# Patient Record
Sex: Male | Born: 1935 | Race: White | Hispanic: No | Marital: Married | Smoking: Former smoker
Health system: Southern US, Community
[De-identification: ages and names within clinical notes are randomized; demographics above are authoritative.]

## PROBLEM LIST (undated history)

## (undated) DIAGNOSIS — M48061 Spinal stenosis, lumbar region without neurogenic claudication: Secondary | ICD-10-CM

## (undated) DIAGNOSIS — H409 Unspecified glaucoma: Secondary | ICD-10-CM

## (undated) DIAGNOSIS — I82409 Acute embolism and thrombosis of unspecified deep veins of unspecified lower extremity: Secondary | ICD-10-CM

## (undated) DIAGNOSIS — I2510 Atherosclerotic heart disease of native coronary artery without angina pectoris: Secondary | ICD-10-CM

## (undated) DIAGNOSIS — N183 Chronic kidney disease, stage 3 unspecified: Secondary | ICD-10-CM

## (undated) DIAGNOSIS — R7301 Impaired fasting glucose: Secondary | ICD-10-CM

## (undated) DIAGNOSIS — E785 Hyperlipidemia, unspecified: Secondary | ICD-10-CM

## (undated) DIAGNOSIS — M5416 Radiculopathy, lumbar region: Secondary | ICD-10-CM

## (undated) DIAGNOSIS — J301 Allergic rhinitis due to pollen: Secondary | ICD-10-CM

## (undated) DIAGNOSIS — E039 Hypothyroidism, unspecified: Secondary | ICD-10-CM

## (undated) DIAGNOSIS — I10 Essential (primary) hypertension: Secondary | ICD-10-CM

## (undated) DIAGNOSIS — C61 Malignant neoplasm of prostate: Secondary | ICD-10-CM

## (undated) DIAGNOSIS — Z8546 Personal history of malignant neoplasm of prostate: Secondary | ICD-10-CM

## (undated) HISTORY — DX: Hypothyroidism, unspecified: E03.9

## (undated) HISTORY — DX: Hyperlipidemia, unspecified: E78.5

## (undated) HISTORY — DX: Radiculopathy, lumbar region: M48.061

## (undated) HISTORY — DX: Unspecified glaucoma: H40.9

## (undated) HISTORY — DX: Essential (primary) hypertension: I10

## (undated) HISTORY — DX: Radiculopathy, lumbar region: M54.16

## (undated) HISTORY — DX: Impaired fasting glucose: R73.01

## (undated) HISTORY — DX: Atherosclerotic heart disease of native coronary artery without angina pectoris: I25.10

## (undated) HISTORY — PX: POSTERIOR LUMBAR FUSION: SHX6036

## (undated) HISTORY — DX: Personal history of malignant neoplasm of prostate: Z85.46

## (undated) HISTORY — DX: Chronic kidney disease, stage 3 unspecified: N18.30

## (undated) HISTORY — DX: Acute embolism and thrombosis of unspecified deep veins of unspecified lower extremity: I82.409

## (undated) HISTORY — DX: Chronic kidney disease, stage 3 (moderate): N18.3

## (undated) HISTORY — DX: Allergic rhinitis due to pollen: J30.1

---

## 1959-03-07 HISTORY — PX: ELBOW SURGERY: SHX618

## 2001-03-06 HISTORY — PX: CORONARY ARTERY BYPASS GRAFT: SHX141

## 2002-03-06 DIAGNOSIS — Z8546 Personal history of malignant neoplasm of prostate: Secondary | ICD-10-CM

## 2002-03-06 DIAGNOSIS — C61 Malignant neoplasm of prostate: Secondary | ICD-10-CM

## 2002-03-06 HISTORY — PX: INSERTION PROSTATE RADIATION SEED: SUR718

## 2002-03-06 HISTORY — DX: Malignant neoplasm of prostate: C61

## 2002-03-06 HISTORY — DX: Personal history of malignant neoplasm of prostate: Z85.46

## 2004-09-03 HISTORY — PX: CAROTID ENDARTERECTOMY: SUR193

## 2015-05-28 LAB — HM COLONOSCOPY

## 2015-11-02 ENCOUNTER — Encounter: Payer: Self-pay | Admitting: Internal Medicine

## 2015-11-02 ENCOUNTER — Ambulatory Visit (INDEPENDENT_AMBULATORY_CARE_PROVIDER_SITE_OTHER): Payer: Medicare PPO | Admitting: Internal Medicine

## 2015-11-02 VITALS — BP 124/74 | HR 60 | Temp 97.5°F | Ht 62.5 in | Wt 143.0 lb

## 2015-11-02 DIAGNOSIS — R0609 Other forms of dyspnea: Secondary | ICD-10-CM

## 2015-11-02 DIAGNOSIS — I82401 Acute embolism and thrombosis of unspecified deep veins of right lower extremity: Secondary | ICD-10-CM

## 2015-11-02 DIAGNOSIS — I2510 Atherosclerotic heart disease of native coronary artery without angina pectoris: Secondary | ICD-10-CM | POA: Diagnosis not present

## 2015-11-02 DIAGNOSIS — H409 Unspecified glaucoma: Secondary | ICD-10-CM | POA: Insufficient documentation

## 2015-11-02 DIAGNOSIS — M5416 Radiculopathy, lumbar region: Secondary | ICD-10-CM | POA: Diagnosis not present

## 2015-11-02 DIAGNOSIS — M48061 Spinal stenosis, lumbar region without neurogenic claudication: Secondary | ICD-10-CM | POA: Insufficient documentation

## 2015-11-02 DIAGNOSIS — Z8546 Personal history of malignant neoplasm of prostate: Secondary | ICD-10-CM | POA: Diagnosis not present

## 2015-11-02 DIAGNOSIS — I82409 Acute embolism and thrombosis of unspecified deep veins of unspecified lower extremity: Secondary | ICD-10-CM | POA: Insufficient documentation

## 2015-11-02 DIAGNOSIS — M4806 Spinal stenosis, lumbar region: Secondary | ICD-10-CM | POA: Diagnosis not present

## 2015-11-02 DIAGNOSIS — R7303 Prediabetes: Secondary | ICD-10-CM | POA: Insufficient documentation

## 2015-11-02 DIAGNOSIS — R0600 Dyspnea, unspecified: Secondary | ICD-10-CM

## 2015-11-02 DIAGNOSIS — E039 Hypothyroidism, unspecified: Secondary | ICD-10-CM

## 2015-11-02 DIAGNOSIS — N183 Chronic kidney disease, stage 3 unspecified: Secondary | ICD-10-CM | POA: Insufficient documentation

## 2015-11-02 DIAGNOSIS — I10 Essential (primary) hypertension: Secondary | ICD-10-CM | POA: Insufficient documentation

## 2015-11-02 DIAGNOSIS — R7301 Impaired fasting glucose: Secondary | ICD-10-CM

## 2015-11-02 DIAGNOSIS — E785 Hyperlipidemia, unspecified: Secondary | ICD-10-CM | POA: Insufficient documentation

## 2015-11-02 NOTE — Assessment & Plan Note (Addendum)
Recent labs stable Will recheck at next OV

## 2015-11-02 NOTE — Assessment & Plan Note (Signed)
Ongoing radicular pain Uses the oxycodone rarely

## 2015-11-02 NOTE — Assessment & Plan Note (Signed)
Okay on statin

## 2015-11-02 NOTE — Progress Notes (Signed)
Subjective:    Patient ID: Dennis Burns, male    DOB: September 03, 1928, 82 y.o.   MRN: [id number]  HPI Here to establish care Wife is with him Moved to [facility] about 3 months ago-from [place]  Has had problems walking lately 2 spinal fusions in past Has pain in left thigh and low back (chronic problem there) Has had extensive PT  Has had dyspnea with exertion for the past year--used to be much more active No chest pain No dizziness or syncope  History of prostate ca Seed and RT PSAs have stayed low--stopped now  Had DVT in right leg noted before 2012 surgery Recurred so now staying on the xarelto  On treatment for glaucoma Pressures have been fine with the Rx  No current outpatient prescriptions on file prior to visit.   No current facility-administered medications on file prior to visit.     No Known Allergies  Past Medical History:  Diagnosis Date  . Allergic rhinitis due to pollen    as child--better now  . Chronic kidney disease, stage III (moderate)   . Coronary artery disease   . DVT, lower extremity, recurrent ([place])   . Glaucoma    [facility]   . History of prostate cancer 2004  . Hyperlipidemia   . Hypertension   . Hypothyroidism   . Impaired fasting glucose   . Spinal stenosis of lumbar region with radiculopathy     Past Surgical History:  Procedure Laterality Date  . CAROTID ENDARTERECTOMY Right 09/2004  . CORONARY ARTERY BYPASS GRAFT  2003  . ELBOW SURGERY Left 1961   after fall  . INSERTION PROSTATE RADIATION SEED  2004   and RT  . POSTERIOR LUMBAR FUSION  2012, 2014    Family History  Problem Relation Age of Onset  . Cancer Mother     colon (cause of death) and breast  . Glaucoma Mother   . Cancer Father     colon  . Stroke Maternal Grandfather   . Diabetes Neg Hx     Social History   Social History  . Marital status: Married    Spouse name: N/A  . Number of children: 5  . Years of education: N/A   Occupational History    . Engineer/consultant--retired     Radio broadcast assistant   Social History Main Topics  . Smoking status: Former Smoker    Quit date: 1978  . Smokeless tobacco: Never Used  . Alcohol use Not on file  . Drug use: Unknown  . Sexual activity: Not on file   Other Topics Concern  . Not on file   Social History Narrative   2nd marriage--- 1980   5 children--2 step children   12 grandchildren   36 great grandchildren      Has living will   Wife is health care POA   Would accept resuscitation   Not sure about tube feeds   Review of Systems  Constitutional: Negative for fatigue and unexpected weight change.  HENT:       Hearing aides Teeth are fine  Respiratory: Positive for shortness of breath. Negative for cough and chest tightness.   Cardiovascular: Negative for chest pain, palpitations and leg swelling.  Gastrointestinal: Negative for abdominal pain, blood in stool and constipation.       No heartburn  Genitourinary: Negative for difficulty urinating and urgency.       Nocturia x 3-4  Musculoskeletal: Positive for arthralgias and back pain.  Some hand arthritis  Skin: Negative for rash.       No suspicious lesions  Neurological: Negative for dizziness, syncope, light-headedness and headaches.  Hematological: Negative for adenopathy. Bruises/bleeds easily.  Psychiatric/Behavioral: Positive for sleep disturbance. Negative for dysphoric mood. The patient is not nervous/anxious.        Sleep affected by nocturia       Objective:   Physical Exam  Constitutional: He appears well-developed and well-nourished. No distress.  HENT:  Mouth/Throat: Oropharynx is clear and moist. No oropharyngeal exudate.  Neck: Normal range of motion. Neck supple. No thyromegaly present.  No thyroid nodule  Cardiovascular: Normal rate, regular rhythm and intact distal pulses.  Exam reveals no gallop.   Coarse Gr 3/6 systolic murmur at upper left SB and blowing murmur at apex ?referred into  carotids (or bruits there) Faint pedal pulses  Pulmonary/Chest: Effort normal and breath sounds normal. No respiratory distress. He has no wheezes. He has no rales.  Abdominal: Soft. There is no tenderness.  Musculoskeletal: He exhibits no edema or tenderness.  Lymphadenopathy:    He has no cervical adenopathy.  Skin: No rash noted. No erythema.  Psychiatric: He has a normal mood and affect. His behavior is normal.          Assessment & Plan:

## 2015-11-02 NOTE — Assessment & Plan Note (Signed)
BP Readings from Last 3 Encounters:  11/02/15 124/74   Good control Will continue current meds If stays down--consider stopping doxazosin (especially if ever has dizziness)

## 2015-11-02 NOTE — Assessment & Plan Note (Addendum)
Doesn't clearly seem to be related to the back problems Has mild AS and moderate MR on echo 2015--but normal LV function Most concerning is possible CAD worsening (anginal equivalent)  Will need stress test or cath Will set up with cardiology  EKG shows LVH with strain pattern which is not new (though there seem to be some lead problems between the 2)

## 2015-11-02 NOTE — Assessment & Plan Note (Signed)
Will continue the xarelto 

## 2015-11-03 ENCOUNTER — Encounter: Payer: Self-pay | Admitting: Internal Medicine

## 2015-11-03 ENCOUNTER — Ambulatory Visit (INDEPENDENT_AMBULATORY_CARE_PROVIDER_SITE_OTHER): Payer: Medicare PPO | Admitting: Cardiovascular Disease

## 2015-11-03 ENCOUNTER — Encounter: Payer: Self-pay | Admitting: Cardiovascular Disease

## 2015-11-03 VITALS — BP 130/60 | HR 66 | Ht 62.0 in | Wt 143.5 lb

## 2015-11-03 DIAGNOSIS — I6529 Occlusion and stenosis of unspecified carotid artery: Secondary | ICD-10-CM | POA: Diagnosis not present

## 2015-11-03 DIAGNOSIS — E785 Hyperlipidemia, unspecified: Secondary | ICD-10-CM

## 2015-11-03 DIAGNOSIS — Z951 Presence of aortocoronary bypass graft: Secondary | ICD-10-CM

## 2015-11-03 DIAGNOSIS — R0989 Other specified symptoms and signs involving the circulatory and respiratory systems: Secondary | ICD-10-CM

## 2015-11-03 DIAGNOSIS — I209 Angina pectoris, unspecified: Secondary | ICD-10-CM

## 2015-11-03 DIAGNOSIS — R0602 Shortness of breath: Secondary | ICD-10-CM | POA: Diagnosis not present

## 2015-11-03 DIAGNOSIS — Z9889 Other specified postprocedural states: Secondary | ICD-10-CM

## 2015-11-03 DIAGNOSIS — I739 Peripheral vascular disease, unspecified: Secondary | ICD-10-CM

## 2015-11-03 MED ORDER — TRAMADOL HCL 50 MG PO TABS: 100.0000 mg | ORAL_TABLET | Freq: Three times a day (TID) | ORAL | 0 refills | Status: DC | PRN

## 2015-11-03 MED ORDER — TETANUS-DIPHTHERIA TOXOIDS TD 5-2 LFU IM INJ: 0.5000 mL | INJECTION | Freq: Once | INTRAMUSCULAR | 0 refills | Status: AC

## 2015-11-03 NOTE — Addendum Note (Signed)
Addended by: [name] I on: 11/03/2015 01:24 PM   Modules accepted: Orders

## 2015-11-03 NOTE — Addendum Note (Signed)
Addended by: [name] I on: 11/03/2015 01:26 PM   Modules accepted: Orders

## 2015-11-03 NOTE — Progress Notes (Addendum)
Cardiology Office Note  Date:  11/03/2015   ID:  Dennis Burns, DOB Jul 07, 1928, MRN [id number]  PCP:  [name], MD   Chief Complaint  Patient presents with  . Other    Ref by Dr. Silvio Pate to establish care for history of CAD; CABG x 4. Meds reviewed by the patient verbally. Pt. c/o shortness of breath.     HPI:  Dennis Burns is a pleasant 82 year old gentleman with history of PAD, s/p CEA on the right, Hyperlipidemia, hypertension, coronary artery disease, CABG x 4 in 2003,  Chronic renal insufficiency who presents by referral from Dr. Silvio Pate for consultation of his coronary disease and PAD He has chronic back pain, sciatica History of prostate cancer, radioactive seeds  For many years since his back surgery 2012 and 2014, he reports he is unable to walk for far Since that time he has had chronic shortness of breath He attributes his symptoms of shortness of breath to his back Last stress test many years ago, but he does not have details with him Records provided to Dr. Silvio Pate to scan in the computer He does report having echocardiogram but does not know when, does not know his ejection fraction  He does have carotid Carotid u/s typically once year, known CEA, bilateral disease Does not know the percentage of his blockages  Used to go to pain management in [place], takes tramadol, occasional opiates  Reports he has had more  problems walking lately Has pain in left thigh and low back . He has tried extensive PT Limited in his ability to exercise as he was told not to twist his back  No dizziness or syncope Reports he is asymptomatic from his sinus bradycardia  Had DVT in right leg noted before 2012 surgery on the xarelto  EKG from 11/02/2015 reviewed This shows sinus bradycardia, ST and T wave abnormality V5, V6, 1 and aVL     Medication List       Accurate as of 11/03/15  1:16 PM. Always use your most recent med list.          Alpha-Lipoic Acid 200 MG  Caps Take 1 capsule by mouth daily.   atorvastatin 80 MG tablet Commonly known as:  LIPITOR Take 1 tablet by mouth daily.   CALCIUM CITRATE PO Take 1 tablet by mouth daily. Takes 300mg  daily.   carvedilol 12.5 MG tablet Commonly known as:  COREG Take 1 tablet by mouth 2 (two) times daily.   Co Q10 200 MG Caps Take 1 capsule by mouth daily.   CRANBERRY EXTRACT PO Take 1 tablet by mouth daily. Takes 1500mg  daily   doxazosin 1 MG tablet Commonly known as:  CARDURA Take 1 mg by mouth daily.   furosemide 40 MG tablet Commonly known as:  LASIX Take 1 tablet by mouth 2 (two) times daily. 1 tablet at noon and 1 at 6pm   latanoprost 0.005 % ophthalmic solution Commonly known as:  XALATAN Place 1 drop into both eyes daily. In morning   levothyroxine 75 MCG tablet Commonly known as:  SYNTHROID, LEVOTHROID Take 1 tablet by mouth daily.   multivitamin tablet Take 1 tablet by mouth daily.   NIFEdipine 60 MG 24 hr tablet Commonly known as:  PROCARDIA-XL/ADALAT CC Take 1 tablet by mouth 2 (two) times daily.   OMEGA-3-6-9 PO Take 1 capsule by mouth 2 (two) times daily.   oxyCODONE 5 MG immediate release tablet Commonly known as:  Oxy IR/ROXICODONE Take 1 tablet by mouth 2 (  two) times daily as needed.   Rivaroxaban 15 MG Tabs tablet Commonly known as:  XARELTO Take 15 mg by mouth daily with supper.   timolol 0.25 % ophthalmic solution Commonly known as:  TIMOPTIC Place 1 drop into both eyes daily. am   traMADol 50 MG tablet Commonly known as:  ULTRAM Take 2 tablets by mouth 3 (three) times daily as needed.   valsartan 80 MG tablet Commonly known as:  DIOVAN Take 1 tablet by mouth 2 (two) times daily.   Vitamin D 2000 units Caps Take 1 capsule by mouth daily.       No Known Allergies  PMH:   has a past medical history of Allergic rhinitis due to pollen; Chronic kidney disease, stage III (moderate); Coronary artery disease; DVT, lower extremity, recurrent ([place]);  Glaucoma; History of prostate cancer (2004); Hyperlipidemia; Hypertension; Hypothyroidism; Impaired fasting glucose; and Spinal stenosis of lumbar region with radiculopathy.  PSH:    Past Surgical History:  Procedure Laterality Date  . CAROTID ENDARTERECTOMY Right 09/2004   [place] GRAFT  2003   [place]   after fall  . INSERTION PROSTATE RADIATION SEED  2004   and RT  . POSTERIOR LUMBAR FUSION  2012, 2014    Current Outpatient Prescriptions  Medication Sig Dispense Refill  . Alpha-Lipoic Acid 200 MG CAPS Take 1 capsule by mouth daily.    Marland Kitchen atorvastatin (LIPITOR) 80 MG tablet Take 1 tablet by mouth daily.    Marland Kitchen CALCIUM CITRATE PO Take 1 tablet by mouth daily. Takes 300mg  daily.    . carvedilol (COREG) 12.5 MG tablet Take 1 tablet by mouth 2 (two) times daily.    . Cholecalciferol (VITAMIN D) 2000 units CAPS Take 1 capsule by mouth daily.    . Coenzyme Q10 (CO Q10) 200 MG CAPS Take 1 capsule by mouth daily.    Marland Kitchen CRANBERRY EXTRACT PO Take 1 tablet by mouth daily. Takes 1500mg  daily    . doxazosin (CARDURA) 1 MG tablet Take 1 mg by mouth daily.    . furosemide (LASIX) 40 MG tablet Take 1 tablet by mouth 2 (two) times daily. 1 tablet at noon and 1 at 6pm    . latanoprost (XALATAN) 0.005 % ophthalmic solution Place 1 drop into both eyes daily. In morning    . levothyroxine (SYNTHROID, LEVOTHROID) 75 MCG tablet Take 1 tablet by mouth daily.    . Multiple Vitamin (MULTIVITAMIN) tablet Take 1 tablet by mouth daily.    Marland Kitchen NIFEdipine (PROCARDIA-XL/ADALAT CC) 60 MG 24 hr tablet Take 1 tablet by mouth 2 (two) times daily.    [name] 3-6-9 Fatty Acids (OMEGA-3-6-9 PO) Take 1 capsule by mouth 2 (two) times daily.    Marland Kitchen oxyCODONE (OXY IR/ROXICODONE) 5 MG immediate release tablet Take 1 tablet by mouth 2 (two) times daily as needed.    . Rivaroxaban (XARELTO) 15 MG TABS tablet Take 15 mg by mouth daily with supper.    . timolol (TIMOPTIC)  0.25 % ophthalmic solution Place 1 drop into both eyes daily. am    . traMADol (ULTRAM) 50 MG tablet Take 2 tablets by mouth 3 (three) times daily as needed.    . valsartan (DIOVAN) 80 MG tablet Take 1 tablet by mouth 2 (two) times daily.     No current facility-administered medications for this visit.      Allergies:   Review of patient's allergies indicates no  known allergies.   Social History:  The patient  reports that he quit smoking about 39 years ago. He has never used smokeless tobacco.   Family History:   family history includes Cancer in his father and mother; Glaucoma in his mother; Stroke in his maternal grandfather.    Review of Systems: Review of Systems  Constitutional: Negative.   Respiratory: Positive for shortness of breath.   Cardiovascular: Negative.   Gastrointestinal: Negative.   Musculoskeletal: Positive for back pain.       Sciatica pain  Neurological: Negative.   Psychiatric/Behavioral: Negative.   All other systems reviewed and are negative.    PHYSICAL EXAM: VS:  BP 130/60 (BP Location: Right Arm, Patient Position: Sitting, Cuff Size: Normal)   Pulse 66   Ht 5\' 2"  (1.575 m)   Wt 143 lb 8 oz (65.1 kg)   BMI 26.25 kg/m  , BMI Body mass index is 26.25 kg/m. GEN: Well nourished, well developed, in no acute distress  HEENT: normal  Neck: no JVD, 2+ b/l carotid bruits, or masses Cardiac: RRR; 2+  Murmur LSB, no rubs, or gallops,no edema  Respiratory:  clear to auscultation bilaterally, normal work of breathing GI: soft, nontender, nondistended, + BS MS: no deformity or atrophy  Skin: warm and dry, no rash Neuro:  Strength and sensation are intact Psych: euthymic mood, full affect    Recent Labs: No results found for requested labs within last 8760 hours.    Lipid Panel No results found for: CHOL, HDL, LDLCALC, TRIG    Wt Readings from Last 3 Encounters:  11/03/15 143 lb 8 oz (65.1 kg)  11/02/15 143 lb (64.9 kg)       ASSESSMENT AND  PLAN:  SOB (shortness of breath) - Plan: NM Myocar Multi W/Spect W/Wall Motion / EF, VAS US CAROTID, LE ARTERIAL Etiology unclear though concerning for ischemia No recent stress test Echocardiogram not available, we'll try to obtain records He is unable to treadmill, pharmacologic Myoview has been ordered  Angina pectoris ([place]) - Plan: NM Myocar Multi W/Spect W/Wall Motion / EF, VAS US CAROTID, LE ARTERIAL Denies chest pain but he is very limited in his ability to ambulate Certainly possible that shortness of breath is his anginal equivalent Stress test as above  Hx of CABG - Plan: NM Myocar Multi W/Spect W/Wall Motion / EF, VAS US CAROTID, LE ARTERIAL We will try to obtain his prior records to determine his surgical anatomy CABG in 2003, no recent cardiac catheterization  Carotid stenosis, unspecified laterality - Plan: NM Myocar Multi W/Spect W/Wall Motion / EF, VAS US CAROTID, LE ARTERIAL Bilateral carotid bruits, known carotid endarterectomy on the right Bruits are very prominent He reports he is scheduled for routine annual ultrasound. We'll schedule this through our office  History of CEA (carotid endarterectomy) - Plan: NM Myocar Multi W/Spect W/Wall Motion / EF, VAS US CAROTID, LE ARTERIAL Previous surgery on the right  Claudication ([place]) - Plan: NM Myocar Multi W/Spect W/Wall Motion / EF, VAS US CAROTID, LE ARTERIAL He does report significant aching in his legs, inability to ambulate very far Reports he's never had lower extremity arterial Dopplers to rule out claudication Lower extremity arterial Doppler has been ordered  Hyperlipidemia Encouraged him to stay on his statin, goal LDL less than 70  Sinus bradycardia Secondary to carvedilol, reports he is asymptomatic Prefers not to change the medications at this time    Total encounter time more than 60 minutes  Greater than 50%  was spent in counseling and coordination of care with the patient   Disposition:   F/U  6  months   Orders Placed This Encounter  Procedures  . NM Myocar Multi W/Spect W/Wall Motion / EF     Signed, Esmond Plants, M.D., Ph.D. 11/03/2015  [place], [place]

## 2015-11-03 NOTE — Patient Instructions (Addendum)
Medication Instructions:   No medication changes made  Labwork:  No new labs needed  Testing/Procedures:  We will schedule a lexiscan myoview for shortness of breath, angina, known CAD, CABG (stop nifedipine and coreg the night before and morning of the stress test) [place]  Your caregiver has ordered a Stress Test with nuclear imaging. The purpose of this test is to evaluate the blood supply to your heart muscle. This procedure is referred to as a "Non-Invasive Stress Test." This is because other than having an IV started in your vein, nothing is inserted or "invades" your body. Cardiac stress tests are done to find areas of poor blood flow to the heart by determining the extent of coronary artery disease (CAD). Some patients exercise on a treadmill, which naturally increases the blood flow to your heart, while others who are  unable to walk on a treadmill due to physical limitations have a pharmacologic/chemical stress agent called Lexiscan . This medicine will mimic walking on a treadmill by temporarily increasing your coronary blood flow.   Please note: these test may take anywhere between 2-4 hours to complete  PLEASE REPORT TO [facility] AT THE FIRST DESK WILL DIRECT YOU WHERE TO GO  Date of Procedure:_____Tuesday, Sept 5_______  Arrival Time for Procedure:____7:15 am________  Instructions regarding medication:   __X__:  Hold NIFEDIPINE & COREG the night before and morning of procedure  How to prepare for your Myoview test: 1. Do not eat or drink after midnight 2. No caffeine for 24 hours prior to test 3. No smoking 24 hours prior to test. 4. Your medication may be taken with water.  If your doctor stopped a medication because of this test, do not take that medication. 5.  Please wear a short sleeve shirt. 6. No perfume, cologne or lotion.   We will also order a carotid ultrasound for carotid stenosis, CEA, bruit  Lower extremity  arterial U/S for claudication symptoms   Follow-Up: It was a pleasure seeing you in the office today. Please call us if you have new issues that need to be addressed before your next appt.  [phone]  Your physician wants you to follow-up in: 6 months.  You will receive a reminder letter in the mail two months in advance. If you don't receive a letter, please call our office to schedule the follow-up appointment.  If you need a refill on your cardiac medications before your next appointment, please call your pharmacy.  Cardiac Nuclear Scanning A cardiac nuclear scan is used to check your heart for problems, such as the following:  A portion of the heart is not getting enough blood.  Part of the heart muscle has died, which happens with a heart attack.  The heart wall is not working normally.  In this test, a radioactive dye (tracer) is injected into your bloodstream. After the tracer has traveled to your heart, a scanning device is used to measure how much of the tracer is absorbed by or distributed to various areas of your heart. LET [facility] CARE PROVIDER KNOW ABOUT:  Any allergies you have.  All medicines you are taking, including vitamins, herbs, eye drops, creams, and over-the-counter medicines.  Previous problems you or members of your family have had with the use of anesthetics.  Any blood disorders you have.  Previous surgeries you have had.  Medical conditions you have.  RISKS AND COMPLICATIONS Generally, this is a safe procedure. However, as with any procedure,  problems can occur. Possible problems include:   Serious chest pain.  Rapid heartbeat.  Sensation of warmth in your chest. This usually passes quickly. BEFORE THE PROCEDURE Ask your health care provider about changing or stopping your regular medicines. PROCEDURE This procedure is usually done at a hospital and takes 2-4 hours.  An IV tube is inserted into one of your veins.  Your health  care provider will inject a small amount of radioactive tracer through the tube.  You will then wait for 20-40 minutes while the tracer travels through your bloodstream.  You will lie down on an exam table so images of your heart can be taken. Images will be taken for about 15-20 minutes.  You will exercise on a treadmill or stationary bike. While you exercise, your heart activity will be monitored with an electrocardiogram (ECG), and your blood pressure will be checked.  If you are unable to exercise, you may be given a medicine to make your heart beat faster.  When blood flow to your heart has peaked, tracer will again be injected through the IV tube.  After 20-40 minutes, you will get back on the exam table and have more images taken of your heart.  When the procedure is over, your IV tube will be removed. AFTER THE PROCEDURE  You will likely be able to leave shortly after the test. Unless your health care provider tells you otherwise, you may return to your normal schedule, including diet, activities, and medicines.  Make sure you find out how and when you will get your test results.   This information is not intended to replace advice given to you by your health care provider. Make sure you discuss any questions you have with your health care provider.   Document Released: 03/17/2004 Document Revised: 02/25/2013 Document Reviewed: 01/29/2013 Elsevier Interactive Patient Education 2016 [employer].   Vascular Ultrasound An ultrasound, also called sonography or ultrasonography, uses harmless sound waves to take pictures of the inside of your body. The pictures are taken with a device called a transducer that is held up against your body. The continually changing pictures can be recorded on videotape or film. A vascular ultrasound is a painless test to see if you have blood flow problems or clots in your blood vessels. It may be done to look at blood vessels almost anywhere in the  body. There are several types of ultrasounds that can be done to look at the blood vessels. They include:  Continuous wave Doppler ultrasound. This type of ultrasound uses the change in pitch of sound waves to provide information about blood flow through a blood vessel. During the test, a health care provider listens to the sounds produced by the transducer.  Duplex ultrasound. This type of ultrasound uses standard ultrasound methods to produce a picture of a blood vessel and surrounding organs. In addition, a computer provides information about the speed and direction of blood flow through the blood vessel. With this type of ultrasound it is possible to see the structures inside the body and to evaluate blood flow within those structures at the same time.  Color Doppler ultrasound. This type of ultrasound uses standard ultrasound methods to produce a picture of a blood vessel. In addition, a computer converts the Doppler sounds into colors that are overlaid on the picture of the blood vessel. These colors represent the speed and direction of blood flow through the vessel.  Power Doppler ultrasound. This type of ultrasound is up to five times  more sensitive than color Doppler ultrasound. Power Doppler ultrasound can also get pictures that are difficult or impossible to get using standard color Doppler ultrasound. Power Doppler ultrasound is most commonly used to evaluate blood flow through vessels within organs, such as the liver or kidneys.  Transcranial Doppler ultrasound. This type of ultrasound looks at blood flow in blood vessels throughout the brain. It can reveal the presence of narrow arteries, clots blocking the vessels, or malformed blood vessels. RISKS AND COMPLICATIONS There are no known risks or complications of having an ultrasound. BEFORE THE PROCEDURE  If the ultrasound scan involves your upper abdomen, you may be directed not to eat, smoke, or chew gum the morning of your exam. Follow  your health care provider's instructions.  During the test, a gel will be applied to your skin. Wear clothing that is easily washable in case the gel gets on your clothes. PROCEDURE  A gel will be applied to your skin. It may feel cool.  The transducer will be placed on the area to be examined.  Pictures will be taken. They will be displayed on one or more monitors that look like small television screens. AFTER THE PROCEDURE  You can safely drive home and return to regular activities immediately after your exam.  Keep follow-up visits as directed by your health care provider.  Ask when your test results will be ready. It is your responsibility to get your test results.   This information is not intended to replace advice given to you by your health care provider. Make sure you discuss any questions you have with your health care provider.   Document Released: 03/03/2004 Document Revised: 03/13/2014 Document Reviewed: 05/15/2013 Elsevier Interactive Patient Education [employer].

## 2015-11-09 ENCOUNTER — Encounter: Payer: Self-pay | Admitting: Cardiovascular Disease

## 2015-11-09 ENCOUNTER — Other Ambulatory Visit: Payer: Self-pay | Admitting: *Deleted

## 2015-11-09 ENCOUNTER — Ambulatory Visit
Admission: RE | Admit: 2015-11-09 | Discharge: 2015-11-09 | Disposition: A | Discharge status: Home / Self Care | Payer: Medicare PPO | Source: Ambulatory Visit | Attending: Cardiovascular Disease | Admitting: Cardiovascular Disease

## 2015-11-09 ENCOUNTER — Telehealth: Payer: Self-pay | Admitting: Cardiovascular Disease

## 2015-11-09 DIAGNOSIS — I6529 Occlusion and stenosis of unspecified carotid artery: Secondary | ICD-10-CM | POA: Diagnosis not present

## 2015-11-09 DIAGNOSIS — I259 Chronic ischemic heart disease, unspecified: Secondary | ICD-10-CM | POA: Insufficient documentation

## 2015-11-09 DIAGNOSIS — Z951 Presence of aortocoronary bypass graft: Secondary | ICD-10-CM | POA: Diagnosis not present

## 2015-11-09 DIAGNOSIS — I739 Peripheral vascular disease, unspecified: Secondary | ICD-10-CM

## 2015-11-09 DIAGNOSIS — I25119 Atherosclerotic heart disease of native coronary artery with unspecified angina pectoris: Secondary | ICD-10-CM | POA: Diagnosis not present

## 2015-11-09 DIAGNOSIS — R0602 Shortness of breath: Secondary | ICD-10-CM

## 2015-11-09 DIAGNOSIS — R931 Abnormal findings on diagnostic imaging of heart and coronary circulation: Secondary | ICD-10-CM | POA: Insufficient documentation

## 2015-11-09 DIAGNOSIS — Z9889 Other specified postprocedural states: Secondary | ICD-10-CM

## 2015-11-09 DIAGNOSIS — I209 Angina pectoris, unspecified: Secondary | ICD-10-CM | POA: Diagnosis not present

## 2015-11-09 DIAGNOSIS — R0989 Other specified symptoms and signs involving the circulatory and respiratory systems: Secondary | ICD-10-CM

## 2015-11-09 MED ORDER — TECHNETIUM TC 99M TETROFOSMIN IV KIT
13.0000 | PACK | Freq: Once | INTRAVENOUS | Status: AC | PRN
  Administered 2015-11-09: 14.055 via INTRAVENOUS

## 2015-11-09 MED ORDER — RIVAROXABAN 15 MG PO TABS: 15.0000 mg | ORAL_TABLET | Freq: Every day | ORAL | 3 refills | Status: DC

## 2015-11-09 MED ORDER — TECHNETIUM TC 99M TETROFOSMIN IV KIT
30.0000 | PACK | Freq: Once | INTRAVENOUS | Status: AC | PRN
  Administered 2015-11-09: 29.182 via INTRAVENOUS

## 2015-11-09 MED ORDER — REGADENOSON 0.4 MG/5ML IV SOLN
0.4000 mg | Freq: Once | INTRAVENOUS | Status: AC
  Administered 2015-11-09: 0.4 mg via INTRAVENOUS

## 2015-11-09 NOTE — Telephone Encounter (Signed)
*  STAT* If patient is at the pharmacy, call can be transferred to refill team.   1. Which medications need to be refilled? (please list name of each medication and dose if known Rivaroxaban (XARELTO) 15 MG TABS tablet)   2. Which pharmacy/location (including street and city if local pharmacy) is medication to be sent to? [facility]  3. Do they need a 30 day or 90 day supply? 90 day    

## 2015-11-09 NOTE — Telephone Encounter (Signed)
Xarelto 15 mg #90 R#3 sent to [facility].

## 2015-11-10 LAB — NM MYOCAR MULTI W/SPECT W/WALL MOTION / EF
CHL CUP NUCLEAR SDS: 0
CHL CUP NUCLEAR SSS: 9
CHL CUP STRESS STAGE 1 GRADE: 0 %
CHL CUP STRESS STAGE 1 HR: 52 {beats}/min
CHL CUP STRESS STAGE 3 SPEED: 0 mph
CHL CUP STRESS STAGE 4 GRADE: 0 %
CHL CUP STRESS STAGE 4 HR: 72 {beats}/min
CHL CUP STRESS STAGE 4 SPEED: 0 mph
CHL CUP STRESS STAGE 5 SBP: 138 mmHg
CSEPEW: 1 METS
CSEPHR: 55 %
CSEPPHR: 67 {beats}/min
CSEPPMHR: 49 %
LV sys vol: 47 mL
LVDIAVOL: 123 mL (ref 62–150)
NUC STRESS TID: 1.12
Rest HR: 61 {beats}/min
SRS: 10
Stage 1 Speed: 0 mph
Stage 2 Grade: 0 %
Stage 2 HR: 52 {beats}/min
Stage 2 Speed: 0 mph
Stage 3 Grade: 0 %
Stage 3 HR: 67 {beats}/min
Stage 5 DBP: 52 mmHg
Stage 5 Grade: 0 %
Stage 5 HR: 68 {beats}/min
Stage 5 Speed: 0 mph

## 2015-11-15 ENCOUNTER — Telehealth: Payer: Self-pay | Admitting: Cardiovascular Disease

## 2015-11-15 NOTE — Telephone Encounter (Signed)
Pt would like to know stress test results. States it is ok to leave a detailed message.

## 2015-11-16 ENCOUNTER — Encounter: Payer: Self-pay | Admitting: Cardiovascular Disease

## 2015-11-16 ENCOUNTER — Telehealth: Payer: Self-pay | Admitting: Cardiovascular Disease

## 2015-11-16 NOTE — Telephone Encounter (Signed)
Reviewed stress test results with patient and his spouse. He is also due for follow up appointment and scheduled him for 12/07/15 at 11:20AM. He verbalized understanding of our conversation and had no further questions at this time.

## 2015-11-19 ENCOUNTER — Other Ambulatory Visit: Payer: Self-pay | Admitting: Cardiovascular Disease

## 2015-11-19 DIAGNOSIS — R0989 Other specified symptoms and signs involving the circulatory and respiratory systems: Secondary | ICD-10-CM

## 2015-11-19 DIAGNOSIS — I739 Peripheral vascular disease, unspecified: Secondary | ICD-10-CM

## 2015-11-29 ENCOUNTER — Ambulatory Visit: Payer: Medicare PPO

## 2015-11-29 DIAGNOSIS — Z951 Presence of aortocoronary bypass graft: Secondary | ICD-10-CM

## 2015-11-29 DIAGNOSIS — I6529 Occlusion and stenosis of unspecified carotid artery: Secondary | ICD-10-CM

## 2015-11-29 DIAGNOSIS — R0989 Other specified symptoms and signs involving the circulatory and respiratory systems: Secondary | ICD-10-CM

## 2015-11-29 DIAGNOSIS — I739 Peripheral vascular disease, unspecified: Secondary | ICD-10-CM

## 2015-11-29 DIAGNOSIS — Z9889 Other specified postprocedural states: Secondary | ICD-10-CM

## 2015-11-29 DIAGNOSIS — I209 Angina pectoris, unspecified: Secondary | ICD-10-CM

## 2015-11-29 DIAGNOSIS — R0602 Shortness of breath: Secondary | ICD-10-CM

## 2015-11-29 DIAGNOSIS — I6523 Occlusion and stenosis of bilateral carotid arteries: Secondary | ICD-10-CM | POA: Diagnosis not present

## 2015-11-29 DIAGNOSIS — R209 Unspecified disturbances of skin sensation: Secondary | ICD-10-CM | POA: Diagnosis not present

## 2015-12-07 ENCOUNTER — Encounter: Payer: Self-pay | Admitting: Cardiovascular Disease

## 2015-12-07 ENCOUNTER — Ambulatory Visit (INDEPENDENT_AMBULATORY_CARE_PROVIDER_SITE_OTHER): Payer: Medicare PPO | Admitting: Cardiovascular Disease

## 2015-12-07 VITALS — BP 118/50 | HR 57 | Ht 63.0 in | Wt 143.5 lb

## 2015-12-07 DIAGNOSIS — I2510 Atherosclerotic heart disease of native coronary artery without angina pectoris: Secondary | ICD-10-CM

## 2015-12-07 DIAGNOSIS — I10 Essential (primary) hypertension: Secondary | ICD-10-CM

## 2015-12-07 MED ORDER — CARVEDILOL 6.25 MG PO TABS: 6.2500 mg | ORAL_TABLET | Freq: Two times a day (BID) | ORAL | 3 refills | Status: DC

## 2015-12-07 MED ORDER — EZETIMIBE 10 MG PO TABS: 10.0000 mg | ORAL_TABLET | Freq: Every day | ORAL | 4 refills | Status: DC

## 2015-12-07 NOTE — Patient Instructions (Addendum)
Medication Instructions:   Please start zetia one a day  Stay on lipitor  Decrease the coreg down to 6.25 mg twice a day  We will send in a referral for repatha/praluent (to [place])   Labwork:  Repeat cholesterol in 2 to 3 months   Testing/Procedures:  No further testing at this time   We will have you follow up with Dr. Fletcher Anon for blockages in the legs    Follow-Up: It was a pleasure seeing you in the office today. Please call us if you have new issues that need to be addressed before your next appt.  [phone]  Your physician wants you to follow-up in: 6 months.  You will receive a reminder letter in the mail two months in advance. If you don't receive a letter, please call our office to schedule the follow-up appointment.  If you need a refill on your cardiac medications before your next appointment, please call your pharmacy.

## 2015-12-07 NOTE — Progress Notes (Signed)
Cardiology Office Note  Date:  12/07/2015   ID:  Dennis Burns, DOB 1928/08/20, MRN [id number]  PCP:  Dennis Simpler, MD   Chief Complaint  Patient presents with  . other    6 month f/u c/o sob and back pain. Meds reviewed verbally with pt.    HPI:  Dennis Burns is a pleasant 82 year old gentleman with history of PAD, s/p CEA on the right, Hyperlipidemia, hypertension, coronary artery disease, CABG x 4 in 2003,  Chronic renal insufficiency, Patient of Dennis Burns, who presents for follow-up of his coronary disease and PAD chronic shortness of breath,  He has chronic back pain, sciatica History of prostate cancer, radioactive seeds Had DVT in right leg noted before 2012 surgery, on  xarelto  Previous echocardiogram showing ejection fraction 60%, mild aortic valve stenosis  In follow-up today, reports that he continues to have shortness of breath though with moderate improvement. He is participating in exercise programs such as  "lifting with Dennis Burns" He will do Weight and rubber bands for one hour Getting more fit, wife confirms this Able to walk further without stopping  Continues to have Chronic back pain Sees pain management, taking tramadol, oxy BID prn  Recent stress test discussed with him, showing fixed inferolateral perfusion defect, no ischemia On attenuation corrected images, possible lateral wall ischemia. Discussed with nuclear department. I feel this could be secondary to over-processing and they will reprocess the pictures for further review  Carotid ultrasound reviewed with him showing 60-79% left carotid disease 40-59% disease on the right Results pulled up in the office  Lower extremity arterial Doppler showing at least moderate common iliac arterial disease, severe disease left mid SFA On further discussion, likely having claudication type symptoms Mixed picture as he has neurologic issues as well from chronic back pain and sciatica  Long discussion concerning his  cholesterol Has not been well-controlled, LDL more than 100, total cholesterol near 200 Tolerating Lipitor 80 mg daily  EKG on today's visit shows sinus bradycardia rate 57 bpm, T-wave abnormality V3 through V6, 1 and aVL. Asymptomatic bradycardia  Other past medical history  back surgery 2012 and 2014, he reports he is unable to walk for far       Medication List       Accurate as of 12/07/15 11:30 AM. Always use your most recent med list.          Alpha-Lipoic Acid 200 MG Caps Take 1 capsule by mouth daily.   atorvastatin 80 MG tablet Commonly known as:  LIPITOR Take 1 tablet by mouth daily.   CALCIUM CITRATE PO Take 1 tablet by mouth daily. Takes 300mg  daily.   carvedilol 12.5 MG tablet Commonly known as:  COREG Take 1 tablet by mouth 2 (two) times daily.   Co Q10 200 MG Caps Take 1 capsule by mouth daily.   CRANBERRY EXTRACT PO Take 1 tablet by mouth daily. Takes 1500mg  daily   doxazosin 1 MG tablet Commonly known as:  CARDURA Take 1 mg by mouth daily.   furosemide 40 MG tablet Commonly known as:  LASIX Take 1 tablet by mouth 2 (two) times daily. 1 tablet at noon and 1 at 6pm   latanoprost 0.005 % ophthalmic solution Commonly known as:  XALATAN Place 1 drop into both eyes daily. In morning   levothyroxine 75 MCG tablet Commonly known as:  SYNTHROID, LEVOTHROID Take 1 tablet by mouth daily.   multivitamin tablet Take 1 tablet by mouth daily.   NIFEdipine 60 MG  24 hr tablet Commonly known as:  PROCARDIA-XL/ADALAT CC Take 1 tablet by mouth 2 (two) times daily.   OMEGA-3-6-9 PO Take 1 capsule by mouth 2 (two) times daily.   oxyCODONE 5 MG immediate release tablet Commonly known as:  Oxy IR/ROXICODONE Take 1 tablet by mouth 2 (two) times daily as needed.   Rivaroxaban 15 MG Tabs tablet Commonly known as:  XARELTO Take 1 tablet (15 mg total) by mouth daily with supper.   timolol 0.25 % ophthalmic solution Commonly known as:  TIMOPTIC Place 1  drop into both eyes daily. am   traMADol 50 MG tablet Commonly known as:  ULTRAM Take 2 tablets (100 mg total) by mouth 3 (three) times daily as needed.   valsartan 80 MG tablet Commonly known as:  DIOVAN Take 1 tablet by mouth 2 (two) times daily.   Vitamin D 2000 units Caps Take 1 capsule by mouth daily.       No Known Allergies  PMH:   has a past medical history of Allergic rhinitis due to pollen; Chronic kidney disease, stage III (moderate); Coronary artery disease; DVT, lower extremity, recurrent ([place]); Glaucoma; History of prostate cancer (2004); Hyperlipidemia; Hypertension; Hypothyroidism; Impaired fasting glucose; and Spinal stenosis of lumbar region with radiculopathy.  PSH:    Past Surgical History:  Procedure Laterality Date  . CAROTID ENDARTERECTOMY Right 09/2004   [place] GRAFT  2003   [place]   after fall  . INSERTION PROSTATE RADIATION SEED  2004   and RT  . POSTERIOR LUMBAR FUSION  2012, 2014    Current Outpatient Prescriptions  Medication Sig Dispense Refill  . Alpha-Lipoic Acid 200 MG CAPS Take 1 capsule by mouth daily.    Marland Kitchen atorvastatin (LIPITOR) 80 MG tablet Take 1 tablet by mouth daily.    Marland Kitchen CALCIUM CITRATE PO Take 1 tablet by mouth daily. Takes 300mg  daily.    . carvedilol (COREG) 12.5 MG tablet Take 1 tablet by mouth 2 (two) times daily.    . Cholecalciferol (VITAMIN D) 2000 units CAPS Take 1 capsule by mouth daily.    . Coenzyme Q10 (CO Q10) 200 MG CAPS Take 1 capsule by mouth daily.    Marland Kitchen CRANBERRY EXTRACT PO Take 1 tablet by mouth daily. Takes 1500mg  daily    . doxazosin (CARDURA) 1 MG tablet Take 1 mg by mouth daily.    . furosemide (LASIX) 40 MG tablet Take 1 tablet by mouth 2 (two) times daily. 1 tablet at noon and 1 at 6pm    . latanoprost (XALATAN) 0.005 % ophthalmic solution Place 1 drop into both eyes daily. In morning    . levothyroxine (SYNTHROID, LEVOTHROID) 75 MCG tablet  Take 1 tablet by mouth daily.    . Multiple Vitamin (MULTIVITAMIN) tablet Take 1 tablet by mouth daily.    Marland Kitchen NIFEdipine (PROCARDIA-XL/ADALAT CC) 60 MG 24 hr tablet Take 1 tablet by mouth 2 (two) times daily.    [name] 3-6-9 Fatty Acids (OMEGA-3-6-9 PO) Take 1 capsule by mouth 2 (two) times daily.    Marland Kitchen oxyCODONE (OXY IR/ROXICODONE) 5 MG immediate release tablet Take 1 tablet by mouth 2 (two) times daily as needed.    . Rivaroxaban (XARELTO) 15 MG TABS tablet Take 1 tablet (15 mg total) by mouth daily with supper. 90 tablet 3  . timolol (TIMOPTIC) 0.25 % ophthalmic solution Place 1 drop into both eyes daily. am    .  traMADol (ULTRAM) 50 MG tablet Take 2 tablets (100 mg total) by mouth 3 (three) times daily as needed. 180 tablet 0  . valsartan (DIOVAN) 80 MG tablet Take 1 tablet by mouth 2 (two) times daily.     No current facility-administered medications for this visit.      Allergies:   Review of patient's allergies indicates no known allergies.   Social History:  The patient  reports that he quit smoking about 39 years ago. He has never used smokeless tobacco. He reports that he drinks alcohol. He reports that he does not use drugs.   Family History:   family history includes Cancer in his father and mother; Glaucoma in his mother; Stroke in his maternal grandfather.    Review of Systems: Review of Systems  Constitutional: Negative.   Respiratory: Positive for shortness of breath.   Cardiovascular: Negative.   Gastrointestinal: Negative.   Musculoskeletal: Positive for back pain.       Sciatica pain, claudication pain  Neurological: Negative.   Psychiatric/Behavioral: Negative.   All other systems reviewed and are negative.    PHYSICAL EXAM: VS:  BP (!) 118/50 (BP Location: Left Arm, Patient Position: Sitting, Cuff Size: Normal)   Pulse (!) 57   Ht 5\' 3"  (1.6 m)   Wt 143 lb 8 oz (65.1 kg)   BMI 25.42 kg/m  , BMI Body mass index is 25.42 kg/m. GEN: Well nourished, well  developed, in no acute distress  HEENT: normal  Neck: no JVD, 2+ b/l carotid bruits, or masses Cardiac: RRR; 2+  Murmur LSB, no rubs, or gallops,no edema  Respiratory:  clear to auscultation bilaterally, normal work of breathing GI: soft, nontender, nondistended, + BS MS: no deformity or atrophy  Skin: warm and dry, no rash Neuro:  Strength and sensation are intact Psych: euthymic mood, full affect    Recent Labs: No results found for requested labs within last 8760 hours.    Lipid Panel No results found for: CHOL, HDL, LDLCALC, TRIG    Wt Readings from Last 3 Encounters:  12/07/15 143 lb 8 oz (65.1 kg)  11/03/15 143 lb 8 oz (65.1 kg)  11/02/15 143 lb (64.9 kg)       ASSESSMENT AND PLAN:  SOB (shortness of breath) - Recent stress test with no significant ischemia  Reprocessing needed of images , pending review  Normal ejection fraction on previous echocardiogram  Suspect secondary to deconditioning If symptoms get worse, would proceed with cardiac catheterization  Angina pectoris ([place]) - Denies chest pain  Encouraged continued exercise program  Hx of CABG - CABG in 2003, no recent cardiac catheterization, normal EF  Carotid stenosis, unspecified laterality - 60-79% disease on the left, moderate on the right Stressed importance of aggressive cholesterol management  History of CEA (carotid endarterectomy) -  Previous surgery on the right  Claudication ([place]) -  Severe disease left mid SFA Moderate disease bilateral common iliac arterials Referred him to Dr. Fletcher Anon for further evaluation  Hyperlipidemia Recommended he start zetia 10 mg daily Consult placed to Followed by him for  repatha or praluent Goal LDL less than 70     Sinus bradycardia Secondary to carvedilol Suggested he decrease carvedilol down to 6.25 mg twice a day   long discussion concerning all of the results above All questions answered, all recent testing reviewed in detail  Total  encounter time more than 45 minutes  Greater than 50% was spent in counseling and coordination of care with the patient  Disposition:   F/U  6 months   Orders Placed This Encounter  Procedures  . EKG 12-Lead     Signed, Esmond Plants, M.D., Ph.D. 12/07/2015  [place], [place]

## 2015-12-10 ENCOUNTER — Telehealth: Payer: Self-pay | Admitting: Cardiovascular Disease

## 2015-12-10 NOTE — Telephone Encounter (Signed)
Pt would like stress test results. Pt states Dr. [name] requested some "corrected images" of his stress test, and pt would like the results. Please call.

## 2015-12-10 NOTE — Telephone Encounter (Signed)
Your note on stress test states:  "Will discuss with nuclear department, request reprocessing of attenuation corrected images"

## 2015-12-14 NOTE — Telephone Encounter (Signed)
I was able to see reprocessed images Does not look like any large regions of ischemia, no further testing needed It was as I expected, poor processing was the cause of perfusion issue Overall study looks good, no reason for catheterization at this time unless he develops new symptoms

## 2015-12-15 NOTE — Telephone Encounter (Signed)
Reviewed results of stress test and he verbalized understanding with no further questions at this time. He was very appreciative of my call and was glad to hear the good news. Instructed him to call back if should have any new symptoms.

## 2015-12-16 ENCOUNTER — Encounter: Payer: Self-pay | Admitting: Cardiovascular Disease

## 2015-12-20 ENCOUNTER — Encounter: Payer: Self-pay | Admitting: Cardiovascular Disease

## 2015-12-20 ENCOUNTER — Ambulatory Visit (INDEPENDENT_AMBULATORY_CARE_PROVIDER_SITE_OTHER): Payer: Medicare PPO | Admitting: Cardiovascular Disease

## 2015-12-20 VITALS — BP 120/50 | HR 67 | Ht 64.0 in | Wt 142.8 lb

## 2015-12-20 DIAGNOSIS — I10 Essential (primary) hypertension: Secondary | ICD-10-CM

## 2015-12-20 DIAGNOSIS — I779 Disorder of arteries and arterioles, unspecified: Secondary | ICD-10-CM

## 2015-12-20 DIAGNOSIS — I350 Nonrheumatic aortic (valve) stenosis: Secondary | ICD-10-CM | POA: Diagnosis not present

## 2015-12-20 DIAGNOSIS — E7800 Pure hypercholesterolemia, unspecified: Secondary | ICD-10-CM | POA: Diagnosis not present

## 2015-12-20 DIAGNOSIS — I739 Peripheral vascular disease, unspecified: Secondary | ICD-10-CM | POA: Diagnosis not present

## 2015-12-20 NOTE — Patient Instructions (Addendum)
Medication Instructions:  Your physician recommends that you continue on your current medications as directed. Please refer to the Current Medication list given to you today.  Labwork: none  Testing/Procedures: Your physician has requested that you have an echocardiogram. Echocardiography is a painless test that uses sound waves to create images of your heart. It provides your doctor with information about the size and shape of your heart and how well your heart's chambers and valves are working. This procedure takes approximately one hour. There are no restrictions for this procedure.    Follow-Up: Your physician wants you to follow-up in: six months with Dr. Arida.  You will receive a reminder letter in the mail two months in advance. If you don't receive a letter, please call our office to schedule the follow-up appointment.   Any Other Special Instructions Will Be Listed Below (If Applicable).     If you need a refill on your cardiac medications before your next appointment, please call your pharmacy.  Echocardiogram An echocardiogram, or echocardiography, uses sound waves (ultrasound) to produce an image of your heart. The echocardiogram is simple, painless, obtained within a short period of time, and offers valuable information to your health care provider. The images from an echocardiogram can provide information such as:  Evidence of coronary artery disease (CAD).  Heart size.  Heart muscle function.  Heart valve function.  Aneurysm detection.  Evidence of a past heart attack.  Fluid buildup around the heart.  Heart muscle thickening.  Assess heart valve function. LET YOUR HEALTH CARE PROVIDER KNOW ABOUT:  Any allergies you have.  All medicines you are taking, including vitamins, herbs, eye drops, creams, and over-the-counter medicines.  Previous problems you or members of your family have had with the use of anesthetics.  Any blood disorders you  have.  Previous surgeries you have had.  Medical conditions you have.  Possibility of pregnancy, if this applies. BEFORE THE PROCEDURE  No special preparation is needed. Eat and drink normally.  PROCEDURE   In order to produce an image of your heart, gel will be applied to your chest and a wand-like tool (transducer) will be moved over your chest. The gel will help transmit the sound waves from the transducer. The sound waves will harmlessly bounce off your heart to allow the heart images to be captured in real-time motion. These images will then be recorded.  You may need an IV to receive a medicine that improves the quality of the pictures. AFTER THE PROCEDURE You may return to your normal schedule including diet, activities, and medicines, unless your health care provider tells you otherwise.   This information is not intended to replace advice given to you by your health care provider. Make sure you discuss any questions you have with your health care provider.   Document Released: 02/18/2000 Document Revised: 03/13/2014 Document Reviewed: 10/28/2012 Elsevier Interactive Patient Education 2016 [employer] Inc.  

## 2015-12-20 NOTE — Progress Notes (Signed)
Cardiology Office Note   Date:  12/20/2015   ID:  Dennis Burns, DOB 08/11/1928, MRN [id number]  PCP:  [name], MD  Cardiologist:   [name], MD   Chief Complaint  Patient presents with  . other    Ref by Dr. [name] evaluation of blockages in legs. Pt. c/o some LE edema, shortness of breath and pain in legs when walking; mostly in the left.       History of Present Illness: Dennis Burns is a 82 y.o. male who was referred by Dr. [name] for evaluation and management of PAD.  He has known history of coronary artery disease status post CABG 4 in 2003, carotid disease status post right CEA, hyperlipidemia, hypertension and peripheral arterial disease. Prior history of prostate cancer and right leg DVT in 2012 treated with Xarelto. He underwent recent noninvasive vascular evaluation which showed normal ABI on the right side and mildly reduced on the left at 0.71. Duplex showed significant bilateral common iliac artery stenosis as well as severe left SFA stenosis. He had back surgery twice in 2014 due to spinal stenosis. Since then, he continued to have lower back pain worse on the left side with radiation to his left leg. This pain is variable in quality and most of the time involves numbness. Occasionally he has tightness in the muscles with some left calf discomfort. The symptoms have affected his ability to walk and be more active. He continues to be active with exercise though mostly with upper body weightlifting. He tries to avoid aerobic exercises due to his symptoms. 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 He does have chronic kidney disease with one functioning kidney. Most recent creatinine was 1.88. The patient complains of significant exertional dyspnea which limits him with how much she can do.   Past Medical History:  Diagnosis Date  . Allergic rhinitis due to pollen    as child--better now  . Chronic kidney disease, stage III (moderate)   . Coronary  artery disease   . DVT, lower extremity, recurrent ([place])   . Glaucoma    [facility]   . History of prostate cancer 2004  . Hyperlipidemia   . Hypertension   . Hypothyroidism   . Impaired fasting glucose   . Spinal stenosis of lumbar region with radiculopathy     Past Surgical History:  Procedure Laterality Date  . CAROTID ENDARTERECTOMY Right 09/2004   [place] GRAFT  2003   [place]   after fall  . INSERTION PROSTATE RADIATION SEED  2004   and RT  . POSTERIOR LUMBAR FUSION  2012, 2014     Current Outpatient Prescriptions  Medication Sig Dispense Refill  . Alpha-Lipoic Acid 200 MG CAPS Take 1 capsule by mouth daily.    Marland Kitchen atorvastatin (LIPITOR) 80 MG tablet Take 1 tablet by mouth daily.    Marland Kitchen CALCIUM CITRATE PO Take 1 tablet by mouth daily. Takes 300mg  daily.    . carvedilol (COREG) 6.25 MG tablet Take 1 tablet (6.25 mg total) by mouth 2 (two) times daily. 60 tablet 3  . Cholecalciferol (VITAMIN D) 2000 units CAPS Take 1 capsule by mouth daily.    . Coenzyme Q10 (CO Q10) 200 MG CAPS Take 1 capsule by mouth daily.    Marland Kitchen CRANBERRY EXTRACT PO Take 1 tablet by mouth daily. Takes 1500mg  daily    . doxazosin (CARDURA)  1 MG tablet Take 1 mg by mouth daily.    Marland Kitchen ezetimibe (ZETIA) 10 MG tablet Take 1 tablet (10 mg total) by mouth daily. 90 tablet 4  . furosemide (LASIX) 40 MG tablet Take 1 tablet by mouth 2 (two) times daily. 1 tablet at noon and 1 at 6pm    . latanoprost (XALATAN) 0.005 % ophthalmic solution Place 1 drop into both eyes daily. In morning    . levothyroxine (SYNTHROID, LEVOTHROID) 75 MCG tablet Take 1 tablet by mouth daily.    . Multiple Vitamin (MULTIVITAMIN) tablet Take 1 tablet by mouth daily.    Marland Kitchen NIFEdipine (PROCARDIA-XL/ADALAT CC) 60 MG 24 hr tablet Take 1 tablet by mouth 2 (two) times daily.    [name] 3-6-9 Fatty Acids (OMEGA-3-6-9 PO) Take 1 capsule by mouth 2 (two) times daily.    Marland Kitchen oxyCODONE  (OXY IR/ROXICODONE) 5 MG immediate release tablet Take 1 tablet by mouth 2 (two) times daily as needed.    . Rivaroxaban (XARELTO) 15 MG TABS tablet Take 1 tablet (15 mg total) by mouth daily with supper. 90 tablet 3  . timolol (TIMOPTIC) 0.25 % ophthalmic solution Place 1 drop into both eyes daily. am    . traMADol (ULTRAM) 50 MG tablet Take 2 tablets (100 mg total) by mouth 3 (three) times daily as needed. 180 tablet 0  . valsartan (DIOVAN) 80 MG tablet Take 1 tablet by mouth 2 (two) times daily.     No current facility-administered medications for this visit.     Allergies:   Review of patient's allergies indicates no known allergies.    Social History:  The patient  reports that he quit smoking about 39 years ago. He has never used smokeless tobacco. He reports that he drinks alcohol. He reports that he does not use drugs.   Family History:  The patient's family history includes Cancer in his father and mother; Glaucoma in his mother; Stroke in his maternal grandfather.    ROS:  Please see the history of present illness.   Otherwise, review of systems are positive for none.   All other systems are reviewed and negative.    PHYSICAL EXAM: VS:  BP (!) 120/50 (BP Location: Right Arm, Patient Position: Sitting, Cuff Size: Normal)   Pulse 67   Ht 5\' 4"  (1.626 m)   Wt 142 lb 12 oz (64.8 kg)   BMI 24.50 kg/m  , BMI Body mass index is 24.5 kg/m. GEN: Well nourished, well developed, in no acute distress  HEENT: normal  Neck: no JVD, or masses. Bilateral carotid bruits Cardiac: RRR; no rubs, or gallops,no edema . 3/6 crescendo decrescendo systolic murmur in the aortic area which is mid peaking. Respiratory:  clear to auscultation bilaterally, normal work of breathing GI: soft, nontender, nondistended, + BS MS: no deformity or atrophy  Skin: warm and dry, no rash Neuro:  Strength and sensation are intact Psych: euthymic mood, full affect Vascular: Femoral pulses are normal bilaterally.  Dorsalis pedis is palpable on the right side and absent on the left. Posterior tibial is absent on both sides.  EKG:  EKG is not ordered today.    Recent Labs: No results found for requested labs within last 8760 hours.    Lipid Panel No results found for: CHOL, TRIG, HDL, CHOLHDL, VLDL, LDLCALC, LDLDIRECT    Wt Readings from Last 3 Encounters:  12/20/15 142 lb 12 oz (64.8 kg)  12/07/15 143 lb 8 oz (65.1 kg)  11/03/15 143 lb  8 oz (65.1 kg)       PAD Screen 12/20/2015 11/03/2015  Previous PAD dx? No No  Previous surgical procedure? No No  Pain with walking? Yes No  Subsides with rest? No -  Feet/toe relief with dangling? No No  Painful, non-healing ulcers? No No  Extremities discolored? No No      ASSESSMENT AND PLAN:  1.  Peripheral arterial disease: The patient has evidence of peripheral arterial disease involving both his iliac arteries as well as left SFA. However, a lot of his symptoms seems to be neuropathic from his previous back surgery. In spite of his iliac disease, his femoral pulses are close to normal. His major complaint seems to be lower back pain which I don't think would improve much with revascularization as it seems to be neuropathic. He does have some left calf claudication but he is limited by his chronic back pain as well as shortness of breath. In addition, he has chronic kidney disease with only one functioning kidney and most recent creatinine 1.88. Thus, he is at high risk for contrast-induced nephropathy. I suggested trying an exercise program before considering angiography and possible endovascular intervention.  He is going to do that and follow-up with me in 6 months.   2. Coronary artery disease involving native coronary arteries with other forms of angina angina: Recent stress test was suboptimal but no clear ischemia.  3. Aortic stenosis: The patient's major complaint seems to be worsening exertional dyspnea. Previous echocardiogram in 2015  showed mild aortic stenosis. By exam, the aortic stenosis seems to be at least moderate. Thus, I requested an echocardiogram for evaluation.  4. Carotid disease status post right carotid endarterectomy: Followed by carotid Dopplers.  5. Hyperlipidemia: Continue high-dose atorvastatin and Zetia with a target LDL of less than 70.  6. Essential hypertension: Blood pressure is controlled on current medications.    Disposition:   FU with me in 6 months  Signed,  [name], MD  12/20/2015 11:42 AM    [place]

## 2015-12-21 ENCOUNTER — Other Ambulatory Visit: Payer: Self-pay

## 2015-12-21 ENCOUNTER — Ambulatory Visit: Payer: Medicare PPO | Admitting: Cardiovascular Disease

## 2015-12-21 ENCOUNTER — Ambulatory Visit (INDEPENDENT_AMBULATORY_CARE_PROVIDER_SITE_OTHER): Payer: Medicare PPO

## 2015-12-21 DIAGNOSIS — I350 Nonrheumatic aortic (valve) stenosis: Secondary | ICD-10-CM

## 2016-01-04 DIAGNOSIS — H401132 Primary open-angle glaucoma, bilateral, moderate stage: Secondary | ICD-10-CM | POA: Diagnosis not present

## 2016-01-12 ENCOUNTER — Telehealth: Payer: Self-pay | Admitting: Cardiovascular Disease

## 2016-01-12 ENCOUNTER — Other Ambulatory Visit: Payer: Self-pay | Admitting: *Deleted

## 2016-01-12 MED ORDER — VALSARTAN 80 MG PO TABS: 80.0000 mg | ORAL_TABLET | Freq: Two times a day (BID) | ORAL | 3 refills | Status: DC

## 2016-01-12 MED ORDER — ATORVASTATIN CALCIUM 80 MG PO TABS: 80.0000 mg | ORAL_TABLET | Freq: Every day | ORAL | 3 refills | Status: DC

## 2016-01-12 NOTE — Telephone Encounter (Signed)
°*  STAT* If patient is at the pharmacy, call can be transferred to refill team.   1. Which medications need to be refilled? (please list name of each medication and dose if known)  Atorvastatin 80 mg 1 a day Valsartan 80 mg 2 times a day  2. Which pharmacy/location (including street and city if local pharmacy) is medication to be sent to? [facility] mail order   3. Do they need a 30 day or 90 day supply? 90 day

## 2016-01-12 NOTE — Telephone Encounter (Signed)
Requested Prescriptions   Signed Prescriptions Disp Refills  . atorvastatin (LIPITOR) 80 MG tablet 90 tablet 3    Sig: Take 1 tablet (80 mg total) by mouth daily.    Authorizing Provider: Minna Merritts    Ordering User: Eugenio Hoes, MARINA C  . valsartan (DIOVAN) 80 MG tablet 180 tablet 3    Sig: Take 1 tablet (80 mg total) by mouth 2 (two) times daily.    Authorizing Provider: Minna Merritts    Ordering User: Britt Bottom

## 2016-02-07 ENCOUNTER — Other Ambulatory Visit: Payer: Medicare PPO

## 2016-02-07 DIAGNOSIS — I2510 Atherosclerotic heart disease of native coronary artery without angina pectoris: Secondary | ICD-10-CM

## 2016-02-08 LAB — LIPID PANEL
CHOL/HDL RATIO: 2.5 ratio (ref 0.0–5.0)
Cholesterol, Total: 136 mg/dL (ref 100–199)
HDL: 54 mg/dL (ref >39)
LDL Calculated: 64 mg/dL (ref 0–99)
Triglycerides: 92 mg/dL (ref 0–149)
VLDL CHOLESTEROL CAL: 18 mg/dL (ref 5–40)

## 2016-02-08 LAB — HEPATIC FUNCTION PANEL
ALK PHOS: 70 IU/L (ref 39–117)
ALT: 29 IU/L (ref 0–44)
AST: 26 IU/L (ref 0–40)
Albumin: 4.3 g/dL (ref 3.5–4.7)
Bilirubin Total: 0.2 mg/dL (ref 0.0–1.2)
Bilirubin, Direct: 0.09 mg/dL (ref 0.00–0.40)
TOTAL PROTEIN: 6.5 g/dL (ref 6.0–8.5)

## 2016-02-23 ENCOUNTER — Other Ambulatory Visit: Payer: Self-pay | Admitting: Internal Medicine

## 2016-02-23 MED ORDER — TRAMADOL HCL 50 MG PO TABS: 100.0000 mg | ORAL_TABLET | Freq: Three times a day (TID) | ORAL | 0 refills | Status: DC | PRN

## 2016-02-23 NOTE — Telephone Encounter (Signed)
Rx printed and waiting for signature tomorrow

## 2016-02-23 NOTE — Telephone Encounter (Signed)
Approved:  #[id number] x 0 

## 2016-02-23 NOTE — Telephone Encounter (Signed)
Last filled 11-03-15 #180 Last OV 11-02-15 Next OV 05-03-16

## 2016-02-24 NOTE — Telephone Encounter (Signed)
Rx faxed to [facility] 

## 2016-03-04 ENCOUNTER — Encounter: Payer: Self-pay | Admitting: Cardiovascular Disease

## 2016-03-07 ENCOUNTER — Other Ambulatory Visit: Payer: Self-pay

## 2016-03-07 MED ORDER — FUROSEMIDE 40 MG PO TABS: 40.0000 mg | ORAL_TABLET | Freq: Two times a day (BID) | ORAL | 3 refills | Status: DC

## 2016-03-08 DIAGNOSIS — M6530 Trigger finger, unspecified finger: Secondary | ICD-10-CM | POA: Insufficient documentation

## 2016-03-08 DIAGNOSIS — M65322 Trigger finger, left index finger: Secondary | ICD-10-CM | POA: Diagnosis not present

## 2016-03-15 ENCOUNTER — Encounter: Payer: Self-pay | Admitting: Internal Medicine

## 2016-03-15 MED ORDER — DICLOFENAC SODIUM 1 % TD GEL: 2.0000 g | Freq: Four times a day (QID) | TRANSDERMAL | 3 refills | Status: DC

## 2016-03-28 ENCOUNTER — Encounter: Payer: Self-pay | Admitting: Internal Medicine

## 2016-03-28 ENCOUNTER — Other Ambulatory Visit: Payer: Self-pay | Admitting: Internal Medicine

## 2016-03-28 NOTE — Telephone Encounter (Signed)
Last filled

## 2016-03-29 NOTE — Telephone Encounter (Signed)
I did not see the original conversation on this-- but he is allowed 6 per day. Theoretically, he is due for Rx even now. Please clarify the situation with him

## 2016-03-29 NOTE — Telephone Encounter (Signed)
He sent a MyChart message asking for a refill to be faxed to [facility] mail order. I had advised him we sent #180 on 02-23-16 under the assumption that was a 90 day supply since it is #2 3 times a day as needed. Pt wants quantity to match sig.

## 2016-03-29 NOTE — Telephone Encounter (Signed)
We can actually send #540 if he wants that much. It might last him well over 3 months but it would be allowed (if he wants that many)

## 2016-03-30 MED ORDER — TRAMADOL HCL 50 MG PO TABS: 100.0000 mg | ORAL_TABLET | Freq: Three times a day (TID) | ORAL | 0 refills | Status: DC | PRN

## 2016-03-30 NOTE — Telephone Encounter (Signed)
Rx faxed to [facility] from external fax after Dr Silvio Pate signed it

## 2016-03-30 NOTE — Addendum Note (Signed)
Addended by: Pilar Grammes on: 03/30/2016 08:08 AM   Modules accepted: Orders

## 2016-03-30 NOTE — Telephone Encounter (Addendum)
Yes, he wants #540 tablets. Rx has been printed and waiting for signature so it can be faxed to [facility].

## 2016-04-10 ENCOUNTER — Encounter: Payer: Self-pay | Admitting: Cardiovascular Disease

## 2016-04-10 ENCOUNTER — Other Ambulatory Visit: Payer: Self-pay

## 2016-04-10 MED ORDER — NIFEDIPINE ER 60 MG PO TB24: 60.0000 mg | ORAL_TABLET | Freq: Two times a day (BID) | ORAL | 3 refills | Status: DC

## 2016-04-11 ENCOUNTER — Telehealth: Payer: Self-pay | Admitting: Cardiovascular Disease

## 2016-04-11 NOTE — Telephone Encounter (Signed)
Spoke w/ pt.  He reports that after all of his cardiac testing, he still does not have an answer for his SOB. He states that he reviewed his meds and they all have the side effect of SOB.  Pt has not discussed w/ PCP, feels that cardiology should be responsible for these meds. He is sched to see Dr. [name] 04/18/16 @ 8:00.

## 2016-04-11 NOTE — Telephone Encounter (Signed)
Patient wants to be seen sooner that April recall .  He wants to talk about medication interactions and his chronic sob.   Scheduled for 06/05/16 at 1020 with gollan.  Please call patient .  He wants to know if he should come in sooner.

## 2016-04-18 ENCOUNTER — Ambulatory Visit (INDEPENDENT_AMBULATORY_CARE_PROVIDER_SITE_OTHER): Payer: Medicare PPO | Admitting: Cardiovascular Disease

## 2016-04-18 ENCOUNTER — Encounter: Payer: Self-pay | Admitting: Cardiovascular Disease

## 2016-04-18 VITALS — BP 110/50 | HR 59 | Ht 63.5 in | Wt 139.8 lb

## 2016-04-18 DIAGNOSIS — N183 Chronic kidney disease, stage 3 unspecified: Secondary | ICD-10-CM

## 2016-04-18 DIAGNOSIS — I10 Essential (primary) hypertension: Secondary | ICD-10-CM | POA: Diagnosis not present

## 2016-04-18 DIAGNOSIS — R0602 Shortness of breath: Secondary | ICD-10-CM

## 2016-04-18 DIAGNOSIS — I2510 Atherosclerotic heart disease of native coronary artery without angina pectoris: Secondary | ICD-10-CM | POA: Diagnosis not present

## 2016-04-18 DIAGNOSIS — I739 Peripheral vascular disease, unspecified: Secondary | ICD-10-CM

## 2016-04-18 DIAGNOSIS — E7800 Pure hypercholesterolemia, unspecified: Secondary | ICD-10-CM | POA: Diagnosis not present

## 2016-04-18 DIAGNOSIS — M48061 Spinal stenosis, lumbar region without neurogenic claudication: Secondary | ICD-10-CM

## 2016-04-18 DIAGNOSIS — I82401 Acute embolism and thrombosis of unspecified deep veins of right lower extremity: Secondary | ICD-10-CM

## 2016-04-18 DIAGNOSIS — M5416 Radiculopathy, lumbar region: Secondary | ICD-10-CM

## 2016-04-18 NOTE — Progress Notes (Signed)
Cardiology Office Note  Date:  04/18/2016   ID:  Dennis Burns, DOB 1928/04/25, MRN [id number]  PCP:  Dennis Simpler, MD   Chief Complaint  Patient presents with  . other     Pt. c/o Shortness of breath and discuss med. Pt c/o chest pain, sob. Reviewed meds with pt verbally.    HPI:  Dennis Burns is a pleasant 83 year old gentleman with history of PAD, s/p CEA on the right, Hyperlipidemia, hypertension, coronary artery disease, CABG x 4 in 2003,  Chronic renal insufficiency, He reports having atrophic kidney, essentially living off one kidney, previously was seen by nephrology, last creatinine April 2017 was 1.47 with elevated BUN in the 40s . Patient of Dr. Silvio Pate, who presents for follow-up of his coronary disease and PAD chronic shortness of breath,  He has chronic back pain, sciatica, spinal stenosis, back surgery 2 History of prostate cancer, radioactive seeds Had DVT in right leg noted before 2012 surgery, on  xarelto Recent echocardiogram October 201 showing moderate aortic valve stenosis, moderate MR, moderately elevated right heart pressures, ejection fraction 55% or greater He does report prior smoking history less than 10 years when he was younger  In follow-up today he continues to have stable but moderate intensity shortness of breath, worse on exertion. He is very troubled by his shortness of breath symptoms,  He feels his symptoms are secondary to medication side effects and has drawn a spread sheet out showing all of the side effects of his medications. We did go through each of his medications with him, long discussion concerning each medication potential side effects, benefits  Review of previous lab work April 2017 with long discussion concerning his renal function Previously seen by nephrology, has not seen anyone locally but is interested  Other issues include chronic back pain, now with numbness down his legs  Previous noninvasive vascular evaluation which showed  normal ABI on the right side and mildly reduced on the left at 0.71. Duplex showed significant bilateral common iliac artery stenosis as well as severe left SFA stenosis.  On his last clinic visit he had moderate shortness of breath though with some improvement  He was participating in exercise program such as  "lifting with liz" He was doing Weight and rubber bands for one hour Getting more fit, wife previously confirmed this Able to walk further without stopping On today's visit more short of breath, possibly less active  Continues to have Chronic back pain Sees pain management, taking tramadol, oxy BID prn  Previous stress test showing fixed inferolateral perfusion defect, no ischemia  Labwork reviewed with him, total cholesterol around 140, LDL at goal on Lipitor and zetia  EKG on today's visit showing normal sinus rhythm with rate 59 bpm, T-wave abnormality V3 through V6, 2, 3, aVF  similar to EKG in 2015. Change in T waves compared to EKG in 2017   Other past medical history reviewed Carotid ultrasound reviewed with him showing 60-79% left carotid disease 40-59% disease on the right  Lower extremity arterial Doppler showing at least moderate common iliac arterial disease, severe disease left mid SFA On further discussion, likely having claudication type symptoms Mixed picture as he has neurologic issues as well from chronic back pain and sciatica   back surgery 2012 and 2014, he reports he is unable to walk for far    PMH:   has a past medical history of Allergic rhinitis due to pollen; Chronic kidney disease, stage III (moderate); Coronary artery disease; DVT, lower  extremity, recurrent ([place]); Glaucoma; History of prostate cancer (2004); Hyperlipidemia; Hypertension; Hypothyroidism; Impaired fasting glucose; and Spinal stenosis of lumbar region with radiculopathy.  PSH:    Past Surgical History:  Procedure Laterality Date  . CAROTID ENDARTERECTOMY Right 09/2004    [place] GRAFT  2003   [place]   after fall  . INSERTION PROSTATE RADIATION SEED  2004   and RT  . POSTERIOR LUMBAR FUSION  2012, 2014    Current Outpatient Prescriptions  Medication Sig Dispense Refill  . Alpha-Lipoic Acid 200 MG CAPS Take 1 capsule by mouth daily.    Marland Kitchen atorvastatin (LIPITOR) 80 MG tablet Take 1 tablet (80 mg total) by mouth daily. 90 tablet 3  . CALCIUM CITRATE PO Take 1 tablet by mouth daily. Takes 300mg  daily.    . carvedilol (COREG) 6.25 MG tablet Take 1 tablet (6.25 mg total) by mouth 2 (two) times daily. 60 tablet 3  . Cholecalciferol (VITAMIN D) 2000 units CAPS Take 1 capsule by mouth daily.    . Coenzyme Q10 (CO Q10) 200 MG CAPS Take 1 capsule by mouth daily.    Marland Kitchen CRANBERRY EXTRACT PO Take 1 tablet by mouth daily. Takes 1500mg  daily    . doxazosin (CARDURA) 1 MG tablet Take 1 mg by mouth daily.    Marland Kitchen ezetimibe (ZETIA) 10 MG tablet Take 1 tablet (10 mg total) by mouth daily. 90 tablet 4  . furosemide (LASIX) 40 MG tablet Take 1 tablet (40 mg total) by mouth 2 (two) times daily. 1 tablet at noon and 1 at 6pm 180 tablet 3  . latanoprost (XALATAN) 0.005 % ophthalmic solution Place 1 drop into both eyes daily. In morning    . levothyroxine (SYNTHROID, LEVOTHROID) 75 MCG tablet Take 1 tablet by mouth daily.    . Multiple Vitamin (MULTIVITAMIN) tablet Take 1 tablet by mouth daily.    Marland Kitchen NIFEdipine (PROCARDIA-XL/ADALAT CC) 60 MG 24 hr tablet Take 1 tablet (60 mg total) by mouth 2 (two) times daily. 180 tablet 3  . Omega 3-6-9 Fatty Acids (OMEGA-3-6-9 PO) Take 1 capsule by mouth 2 (two) times daily.    Marland Kitchen oxyCODONE (OXY IR/ROXICODONE) 5 MG immediate release tablet Take 1 tablet by mouth 2 (two) times daily as needed.    . Rivaroxaban (XARELTO) 15 MG TABS tablet Take 1 tablet (15 mg total) by mouth daily with supper. 90 tablet 3  . timolol (TIMOPTIC) 0.25 % ophthalmic solution Place 1 drop into both eyes daily.  am    . traMADol (ULTRAM) 50 MG tablet Take 2 tablets (100 mg total) by mouth 3 (three) times daily as needed. 540 tablet 0  . valsartan (DIOVAN) 80 MG tablet Take 1 tablet (80 mg total) by mouth 2 (two) times daily. 180 tablet 3   No current facility-administered medications for this visit.      Allergies:   Patient has no known allergies.   Social History:  The patient  reports that he quit smoking about 40 years ago. He has never used smokeless tobacco. He reports that he drinks alcohol. He reports that he does not use drugs.   Family History:   family history includes Cancer in his father and mother; Glaucoma in his mother; Stroke in his maternal grandfather.    Review of Systems: Review of Systems  Constitutional: Negative.   Respiratory: Positive for shortness of breath.   Cardiovascular: Negative.   Gastrointestinal: Negative.  Musculoskeletal: Positive for back pain.  Neurological: Negative.        Numbness down his legs bilaterally  Psychiatric/Behavioral: Negative.   All other systems reviewed and are negative.    PHYSICAL EXAM: VS:  BP (!) 110/50 (BP Location: Left Arm, Patient Position: Sitting, Cuff Size: Normal)   Pulse (!) 59   Ht 5' 3.5" (1.613 m)   Wt 139 lb 12 oz (63.4 kg)   SpO2 98%   BMI 24.37 kg/m  , BMI Body mass index is 24.37 kg/m. GEN: Well nourished, well developed, in no acute distress  HEENT: normal  Neck: no JVD, carotid bruits, or masses Cardiac: RRR; no murmurs, rubs, or gallops,no edema  Respiratory:  Scant crackles in the bases anteriorly, noted on the left otherwise relatively clear, normal work of breathing GI: soft, nontender, nondistended, + BS MS: no deformity or atrophy  Skin: warm and dry, no rash Neuro:  Strength and sensation are intact Psych: euthymic mood, full affect    Recent Labs: 02/07/2016: ALT 29    Lipid Panel Lab Results  Component Value Date   CHOL 136 02/07/2016   HDL 54 02/07/2016   LDLCALC 64  02/07/2016   TRIG 92 02/07/2016      Wt Readings from Last 3 Encounters:  04/18/16 139 lb 12 oz (63.4 kg)  12/20/15 142 lb 12 oz (64.8 kg)  12/07/15 143 lb 8 oz (65.1 kg)       ASSESSMENT AND PLAN:  Coronary artery disease involving native coronary artery of native heart without angina pectoris - Plan: EKG XX123456, Basic Metabolic Panel (BMET) Subtle EKG change on today's EKG, discussed with him in detail Similar to 2015, no change in his symptoms. Recommend we monitor for now He did have recent stress test with no large regions of ischemia  Pure hypercholesterolemia - Plan: EKG XX123456, Basic Metabolic Panel (BMET) Cholesterol is at goal on the current lipid regimen. No changes to the medications were made.  Essential hypertension - Plan: EKG XX123456, Basic Metabolic Panel (BMET) Long discussion concerning each of his medications, potential side effect. He was concerned medications were contributing to his shortness of breath  SOB (shortness of breath) - Plan: EKG XX123456, Basic Metabolic Panel (BMET) Most of the visit was spent discussing shortness of breath symptoms I suspect symptoms are secondary to aortic valve disease, mitral valve regurgitation, moderately elevated right heart pressures consistent with moderate pulmonary hypertension, all in the setting of underlying renal dysfunction and inability to titrate up his diuretics. Unable to exclude some deconditioning as he was doing better on his last clinic visit when he was exercising regularly. This was confirmed by his wife Of some concern is a scant crackle in the lung field anteriorly on the left. Unable to exclude some fibrosis or old scarring . given his symptoms he may benefit from baseline PFTs . Will defer to primary care  Recurrent deep vein thrombosis (DVT) of right lower extremity (HCC) on anticoagulation , no bleeding  Spinal stenosis of lumbar region with radiculopathy complaining about lower extremity  neuropathy, getting worse   Chronic kidney disease, stage III (moderate)  long discussion concerning renal function, this may be contributed to fluid retention, pulmonary hypertension symptoms . He has requested a visit with nephrology. We have made an appointment for nephrology in [facility] at his request with Dr. Candiss Norse  PAD (peripheral artery disease) [facility]) Recently seen by Dr.  Fletcher Anon lower extremity arterial disease. Stable at this time    Total encounter  time more than 45 minutes  Greater than 50% was spent in counseling and coordination of care with the patient   Disposition:   F/U  6 months   Orders Placed This Encounter  Procedures  . Basic Metabolic Panel (BMET)  . EKG 12-Lead     Signed, Esmond Plants, M.D., Ph.D. 04/18/2016  [place], [place]

## 2016-04-18 NOTE — Patient Instructions (Addendum)
We will make a appt with renal doctor [facility] [address], [place], [place] [address] [phone] Dr. Candiss Norse Wednesday, Feb 21 @ 11:30  Medication Instructions:   No medication changes made  Labwork:  BMP today  Testing/Procedures:  No further testing at this time   I recommend watching educational videos on topics of interest to you at:       [email or web address]  Enter code: [email or web address]    Follow-Up: It was a pleasure seeing you in the office today. Please call us if you have new issues that need to be addressed before your next appt.  [phone]  Your physician wants you to follow-up in: 6 months.  You will receive a reminder letter in the mail two months in advance. If you don't receive a letter, please call our office to schedule the follow-up appointment.  If you need a refill on your cardiac medications before your next appointment, please call your pharmacy.

## 2016-04-19 LAB — BASIC METABOLIC PANEL
BUN / CREAT RATIO: 38 — AB (ref 10–24)
BUN: 78 mg/dL — AB (ref 8–27)
CHLORIDE: 101 mmol/L (ref 96–106)
CO2: 21 mmol/L (ref 18–29)
Calcium: 9.2 mg/dL (ref 8.6–10.2)
Creatinine, Ser: 2.07 mg/dL — ABNORMAL HIGH (ref 0.76–1.27)
GFR calc non Af Amer: 28 mL/min/{1.73_m2} — ABNORMAL LOW (ref >59)
GFR, EST AFRICAN AMERICAN: 33 mL/min/{1.73_m2} — AB (ref >59)
GLUCOSE: 124 mg/dL — AB (ref 65–99)
Potassium: 5.1 mmol/L (ref 3.5–5.2)
SODIUM: 142 mmol/L (ref 134–144)

## 2016-04-19 NOTE — Telephone Encounter (Signed)
Pt is returning your call

## 2016-04-19 NOTE — Telephone Encounter (Signed)
Please see result note 

## 2016-04-26 DIAGNOSIS — R601 Generalized edema: Secondary | ICD-10-CM | POA: Diagnosis not present

## 2016-04-26 DIAGNOSIS — Q602 Renal agenesis, unspecified: Secondary | ICD-10-CM | POA: Diagnosis not present

## 2016-04-26 DIAGNOSIS — I10 Essential (primary) hypertension: Secondary | ICD-10-CM | POA: Diagnosis not present

## 2016-04-26 DIAGNOSIS — N183 Chronic kidney disease, stage 3 (moderate): Secondary | ICD-10-CM | POA: Diagnosis not present

## 2016-05-03 ENCOUNTER — Encounter: Payer: Self-pay | Admitting: Internal Medicine

## 2016-05-03 ENCOUNTER — Ambulatory Visit (INDEPENDENT_AMBULATORY_CARE_PROVIDER_SITE_OTHER): Payer: Medicare PPO | Admitting: Internal Medicine

## 2016-05-03 ENCOUNTER — Other Ambulatory Visit: Payer: Self-pay | Admitting: Internal Medicine

## 2016-05-03 VITALS — BP 108/62 | HR 56 | Temp 97.6°F | Ht 62.5 in | Wt 140.2 lb

## 2016-05-03 DIAGNOSIS — I272 Pulmonary hypertension, unspecified: Secondary | ICD-10-CM | POA: Diagnosis not present

## 2016-05-03 DIAGNOSIS — I739 Peripheral vascular disease, unspecified: Secondary | ICD-10-CM

## 2016-05-03 DIAGNOSIS — M5416 Radiculopathy, lumbar region: Secondary | ICD-10-CM | POA: Diagnosis not present

## 2016-05-03 DIAGNOSIS — I82401 Acute embolism and thrombosis of unspecified deep veins of right lower extremity: Secondary | ICD-10-CM

## 2016-05-03 DIAGNOSIS — M48061 Spinal stenosis, lumbar region without neurogenic claudication: Secondary | ICD-10-CM

## 2016-05-03 DIAGNOSIS — E039 Hypothyroidism, unspecified: Secondary | ICD-10-CM | POA: Diagnosis not present

## 2016-05-03 DIAGNOSIS — Z0000 Encounter for general adult medical examination without abnormal findings: Secondary | ICD-10-CM

## 2016-05-03 LAB — RENAL FUNCTION PANEL
ALBUMIN: 4.1 g/dL (ref 3.5–5.2)
BUN: 47 mg/dL — AB (ref 6–23)
CO2: 27 mEq/L (ref 19–32)
Calcium: 9.4 mg/dL (ref 8.4–10.5)
Chloride: 107 mEq/L (ref 96–112)
Creatinine, Ser: 1.65 mg/dL — ABNORMAL HIGH (ref 0.40–1.50)
GFR: 42.28 mL/min — ABNORMAL LOW (ref >60.00)
GLUCOSE: 117 mg/dL — AB (ref 70–99)
Phosphorus: 3.7 mg/dL (ref 2.3–4.6)
Potassium: 5 mEq/L (ref 3.5–5.1)
Sodium: 140 mEq/L (ref 135–145)

## 2016-05-03 LAB — CBC WITH DIFFERENTIAL/PLATELET
BASOS PCT: 0.8 % (ref 0.0–3.0)
Basophils Absolute: 0.1 10*3/uL (ref 0.0–0.1)
EOS ABS: 0.1 10*3/uL (ref 0.0–0.7)
Eosinophils Relative: 1.4 % (ref 0.0–5.0)
HCT: 38.1 % — ABNORMAL LOW (ref 39.0–52.0)
Hemoglobin: 12.8 g/dL — ABNORMAL LOW (ref 13.0–17.0)
Lymphocytes Relative: 25.3 % (ref 12.0–46.0)
Lymphs Abs: 1.7 10*3/uL (ref 0.7–4.0)
MCHC: 33.6 g/dL (ref 30.0–36.0)
MCV: 91.5 fl (ref 78.0–100.0)
MONO ABS: 0.8 10*3/uL (ref 0.1–1.0)
Monocytes Relative: 12.5 % — ABNORMAL HIGH (ref 3.0–12.0)
NEUTROS ABS: 3.9 10*3/uL (ref 1.4–7.7)
NEUTROS PCT: 60 % (ref 43.0–77.0)
PLATELETS: 204 10*3/uL (ref 150.0–400.0)
RBC: 4.16 Mil/uL — ABNORMAL LOW (ref 4.22–5.81)
RDW: 13.6 % (ref 11.5–15.5)
WBC: 6.6 10*3/uL (ref 4.0–10.5)

## 2016-05-03 LAB — TSH: TSH: 0.86 u[IU]/mL (ref 0.35–4.50)

## 2016-05-03 LAB — T4, FREE: FREE T4: 1.14 ng/dL (ref 0.60–1.60)

## 2016-05-03 NOTE — Assessment & Plan Note (Signed)
Will recheck levels

## 2016-05-03 NOTE — Assessment & Plan Note (Signed)
On reduced furosemide due to higher creatinine

## 2016-05-03 NOTE — Progress Notes (Signed)
Subjective:    Patient ID: Dennis Burns, male    DOB: [birth year], 83 y.o.   MRN: [id number]  HPI Here for Medicare wellness visit and follow up of chronic health conditions Reviewed form and advanced directives Reviewed other doctors and recent cardiology visit No tobacco now Enjoys 1-2 drinks a day Regular exercise No falls No depression or anhedonia. Just frustrated by limitations Vision is okay Hearing okay with hearing aides Independent with instrumental ADLs No apparent memory issues  Saw Dr Candiss Norse-- had wonderful visit Feels the SOB is due to deconditioning Stopped the doxazosin Dr [name] decreased the furosemide to once a day--no change in edema Weight is down some from his first visit here---but reasonably stable Has been trying to exercise more---but back hurts with walking (and gets DOE). Will rest and then restart  Has ongoing numbness in left leg--all the time Related to the spinal stenosis No sig leg pain with exertion though Still saying Dr Birdie Sons for chiropractic--using his machines. No clear help Tramadol does take the edge off--and rarely takes the oxycodone  Diagnosed with pulmonary hypertension based on echo Is on the diuretic May be part of his DOE  BP runs low at times Tries to stagger his BP meds now This seems to have smoothed out his BP  No evidence of recurrent DVT Still on the xarelto  Current Outpatient Prescriptions on File Prior to Visit  Medication Sig Dispense Refill  . Alpha-Lipoic Acid 200 MG CAPS Take 1 capsule by mouth daily.    Marland Kitchen atorvastatin (LIPITOR) 80 MG tablet Take 1 tablet (80 mg total) by mouth daily. 90 tablet 3  . CALCIUM CITRATE PO Take 1 tablet by mouth daily. Takes 300mg  daily.    . carvedilol (COREG) 6.25 MG tablet Take 1 tablet (6.25 mg total) by mouth 2 (two) times daily. 60 tablet 3  . Cholecalciferol (VITAMIN D) 2000 units CAPS Take 1 capsule by mouth daily.    . Coenzyme Q10 (CO Q10) 200 MG CAPS Take 1 capsule  by mouth daily.    Marland Kitchen CRANBERRY EXTRACT PO Take 1 tablet by mouth daily. Takes 1500mg  daily    . ezetimibe (ZETIA) 10 MG tablet Take 1 tablet (10 mg total) by mouth daily. 90 tablet 4  . furosemide (LASIX) 40 MG tablet Take 1 tablet (40 mg total) by mouth 2 (two) times daily. 1 tablet at noon and 1 at 6pm (Patient taking differently: Take 40 mg by mouth daily. ) 180 tablet 3  . latanoprost (XALATAN) 0.005 % ophthalmic solution Place 1 drop into both eyes daily. In morning    . levothyroxine (SYNTHROID, LEVOTHROID) 75 MCG tablet Take 1 tablet by mouth daily.    . Multiple Vitamin (MULTIVITAMIN) tablet Take 1 tablet by mouth daily.    Marland Kitchen NIFEdipine (PROCARDIA-XL/ADALAT CC) 60 MG 24 hr tablet Take 1 tablet (60 mg total) by mouth 2 (two) times daily. 180 tablet 3  . Omega 3-6-9 Fatty Acids (OMEGA-3-6-9 PO) Take 1 capsule by mouth 2 (two) times daily.    Marland Kitchen oxyCODONE (OXY IR/ROXICODONE) 5 MG immediate release tablet Take 1 tablet by mouth 2 (two) times daily as needed.    . Rivaroxaban (XARELTO) 15 MG TABS tablet Take 1 tablet (15 mg total) by mouth daily with supper. 90 tablet 3  . timolol (TIMOPTIC) 0.25 % ophthalmic solution Place 1 drop into both eyes daily. am    . traMADol (ULTRAM) 50 MG tablet Take 2 tablets (100 mg total) by mouth 3 (three)  times daily as needed. 540 tablet 0  . valsartan (DIOVAN) 80 MG tablet Take 1 tablet (80 mg total) by mouth 2 (two) times daily. 180 tablet 3   No current facility-administered medications on file prior to visit.     No Known Allergies  Past Medical History:  Diagnosis Date  . Allergic rhinitis due to pollen    as child--better now  . Chronic kidney disease, stage III (moderate)   . Coronary artery disease   . DVT, lower extremity, recurrent ([place])   . Glaucoma    [facility]   . History of prostate cancer 2004  . Hyperlipidemia   . Hypertension   . Hypothyroidism   . Impaired fasting glucose   . Spinal stenosis of lumbar region with  radiculopathy     Past Surgical History:  Procedure Laterality Date  . CAROTID ENDARTERECTOMY Right 09/2004   [place] GRAFT  2003   [place]   after fall  . INSERTION PROSTATE RADIATION SEED  2004   and RT  . POSTERIOR LUMBAR FUSION  2012, 2014    Family History  Problem Relation Age of Onset  . Cancer Mother     colon (cause of death) and breast  . Glaucoma Mother   . Cancer Father     colon  . Stroke Maternal Grandfather   . Diabetes Neg Hx     Social History   Social History  . Marital status: Married    Spouse name: N/A  . Number of children: 5  . Years of education: N/A   Occupational History  . Engineer/consultant--retired     Radio broadcast assistant   Social History Main Topics  . Smoking status: Former Smoker    Quit date: 1978  . Smokeless tobacco: Never Used  . Alcohol use Yes  . Drug use: No  . Sexual activity: Not on file   Other Topics Concern  . Not on file   Social History Narrative   2nd marriage--- 1980   5 children--2 step children   12 grandchildren   48 great grandchildren      Has living will   Wife is health care POA   Would accept resuscitation   Not sure about tube feeds   Review of Systems Appetite is good Weight is stable Sleeps fair--frequent awakening. Nocturia, but then can go back Slow daytime stream at night--better in day Wears seat belt Teeth are okay--- overdue for dentist (needs local dentist) Bowels are fine. No blood Some upper back problems also but no other arthritic issues No rash or suspicious skin lesions. No recent derm evaluation No heartburn or dysphagia     Objective:   Physical Exam  Constitutional: He is oriented to person, place, and time.  Cardiovascular: Normal rate and regular rhythm.  Exam reveals no gallop.   Gr 3/6 aortic systolic murmur Very faint pedal pulses  Pulmonary/Chest: Effort normal and breath sounds normal. No  respiratory distress. He has no wheezes. He has no rales.  Abdominal: Soft. There is no tenderness.  Musculoskeletal: He exhibits no tenderness.  Trace ankle edema  Neurological: He is alert and oriented to person, place, and time.  President-- "Sheela Stack------ ??" [phone] [id number] Recall 3/3  Skin: No rash noted. No erythema.  Psychiatric: He has a normal mood and affect. His behavior is normal.          Assessment & Plan:

## 2016-05-03 NOTE — Assessment & Plan Note (Signed)
Continues on the xarelto

## 2016-05-03 NOTE — Assessment & Plan Note (Signed)
I have personally reviewed the Medicare Annual Wellness questionnaire and have noted 1. The patient's medical and social history 2. Their use of alcohol, tobacco or illicit drugs 3. Their current medications and supplements 4. The patient's functional ability including ADL's, fall risks, home safety risks and hearing or visual             impairment. 5. Diet and physical activities 6. Evidence for depression or mood disorders  The patients weight, height, BMI and visual acuity have been recorded in the chart I have made referrals, counseling and provided education to the patient based review of the above and I have provided the pt with a written personalized care plan for preventive services.  I have provided you with a copy of your personalized plan for preventive services. Please take the time to review along with your updated medication list.  No cancer screening due to age UTD on imms Working on fitness

## 2016-05-03 NOTE — Assessment & Plan Note (Signed)
Ongoing claudication that may be mixed vascular and neurogenic Continues on the analgesics No surprises on CSRS

## 2016-05-03 NOTE — Assessment & Plan Note (Signed)
Recent eval by Dr Fletcher Anon No action

## 2016-05-03 NOTE — Progress Notes (Signed)
Pre visit review using our clinic review tool, if applicable. No additional management support is needed unless otherwise documented below in the visit note. 

## 2016-05-04 ENCOUNTER — Other Ambulatory Visit: Payer: Self-pay | Admitting: *Deleted

## 2016-05-04 MED ORDER — TRAMADOL HCL 50 MG PO TABS: 100.0000 mg | ORAL_TABLET | Freq: Three times a day (TID) | ORAL | 0 refills | Status: DC | PRN

## 2016-05-04 NOTE — Telephone Encounter (Signed)
Rx faxed to requested pharmacy 

## 2016-05-04 NOTE — Telephone Encounter (Signed)
Last filled 1/25 for #540 per chart. Pt states he only received #180. Last f/u 04/2016-CPE.

## 2016-05-04 NOTE — Telephone Encounter (Signed)
Approved:  #[id number] x 0 

## 2016-05-20 ENCOUNTER — Encounter: Payer: Self-pay | Admitting: Cardiovascular Disease

## 2016-05-20 ENCOUNTER — Other Ambulatory Visit: Payer: Self-pay | Admitting: Cardiovascular Disease

## 2016-05-22 ENCOUNTER — Other Ambulatory Visit: Payer: Self-pay | Admitting: *Deleted

## 2016-05-22 MED ORDER — CARVEDILOL 6.25 MG PO TABS: 6.2500 mg | ORAL_TABLET | Freq: Two times a day (BID) | ORAL | 3 refills | Status: DC

## 2016-06-05 ENCOUNTER — Other Ambulatory Visit: Payer: Self-pay | Admitting: Internal Medicine

## 2016-06-05 ENCOUNTER — Ambulatory Visit: Payer: Medicare PPO | Admitting: Cardiovascular Disease

## 2016-06-05 NOTE — Telephone Encounter (Signed)
I do not think this was ever addressed and I received another refill request today.

## 2016-06-07 MED ORDER — TRAMADOL HCL 50 MG PO TABS: 100.0000 mg | ORAL_TABLET | Freq: Three times a day (TID) | ORAL | 2 refills | Status: DC | PRN

## 2016-06-07 NOTE — Telephone Encounter (Signed)
That sounds fine

## 2016-06-07 NOTE — Addendum Note (Signed)
Addended by: Pilar Grammes on: 06/07/2016 10:03 AM   Modules accepted: Orders

## 2016-06-07 NOTE — Addendum Note (Signed)
Addended by: [name] I on: 06/07/2016 10:12 AM   Modules accepted: Orders

## 2016-06-07 NOTE — Telephone Encounter (Signed)
The pt and I have been trying to figure out how to do his tramadol rx through his mail order. He found out they only do 30 day supplies, but will accept refills. So, he is asking that we do a rx for tramadol #180 with 2 refills instead of #540. It is a lot cheaper for him to get through mail order than it is to do at the local pharmacy.

## 2016-06-07 NOTE — Telephone Encounter (Signed)
RX faxed to [facility] 

## 2016-06-13 DIAGNOSIS — L03019 Cellulitis of unspecified finger: Secondary | ICD-10-CM | POA: Insufficient documentation

## 2016-07-21 ENCOUNTER — Encounter: Payer: Self-pay | Admitting: Internal Medicine

## 2016-07-21 MED ORDER — LEVOTHYROXINE SODIUM 75 MCG PO TABS: 75.0000 ug | ORAL_TABLET | Freq: Every day | ORAL | 3 refills | Status: DC

## 2016-07-22 ENCOUNTER — Emergency Department
Admission: EM | Admit: 2016-07-22 | Discharge: 2016-07-22 | Disposition: A | Discharge status: Home / Self Care | Payer: Medicare PPO | Attending: Emergency Medicine | Admitting: Emergency Medicine

## 2016-07-22 ENCOUNTER — Encounter: Payer: Self-pay | Admitting: Emergency Medicine

## 2016-07-22 ENCOUNTER — Emergency Department: Payer: Medicare PPO

## 2016-07-22 DIAGNOSIS — I209 Angina pectoris, unspecified: Secondary | ICD-10-CM

## 2016-07-22 DIAGNOSIS — Z79899 Other long term (current) drug therapy: Secondary | ICD-10-CM | POA: Diagnosis not present

## 2016-07-22 DIAGNOSIS — I25119 Atherosclerotic heart disease of native coronary artery with unspecified angina pectoris: Secondary | ICD-10-CM | POA: Diagnosis not present

## 2016-07-22 DIAGNOSIS — Z87891 Personal history of nicotine dependence: Secondary | ICD-10-CM | POA: Insufficient documentation

## 2016-07-22 DIAGNOSIS — I129 Hypertensive chronic kidney disease with stage 1 through stage 4 chronic kidney disease, or unspecified chronic kidney disease: Secondary | ICD-10-CM | POA: Insufficient documentation

## 2016-07-22 DIAGNOSIS — I10 Essential (primary) hypertension: Secondary | ICD-10-CM | POA: Diagnosis not present

## 2016-07-22 DIAGNOSIS — N183 Chronic kidney disease, stage 3 (moderate): Secondary | ICD-10-CM | POA: Diagnosis not present

## 2016-07-22 DIAGNOSIS — R079 Chest pain, unspecified: Secondary | ICD-10-CM | POA: Diagnosis not present

## 2016-07-22 DIAGNOSIS — Z8546 Personal history of malignant neoplasm of prostate: Secondary | ICD-10-CM | POA: Diagnosis not present

## 2016-07-22 DIAGNOSIS — E039 Hypothyroidism, unspecified: Secondary | ICD-10-CM | POA: Insufficient documentation

## 2016-07-22 LAB — BASIC METABOLIC PANEL
Anion gap: 11 (ref 5–15)
BUN: 53 mg/dL — ABNORMAL HIGH (ref 6–20)
CALCIUM: 8.7 mg/dL — AB (ref 8.9–10.3)
CO2: 26 mmol/L (ref 22–32)
CREATININE: 1.66 mg/dL — AB (ref 0.61–1.24)
Chloride: 103 mmol/L (ref 101–111)
GFR calc Af Amer: 42 mL/min — ABNORMAL LOW (ref >60)
GFR calc non Af Amer: 36 mL/min — ABNORMAL LOW (ref >60)
Glucose, Bld: 86 mg/dL (ref 65–99)
Potassium: 4.5 mmol/L (ref 3.5–5.1)
Sodium: 140 mmol/L (ref 135–145)

## 2016-07-22 LAB — CBC
HCT: 35.6 % — ABNORMAL LOW (ref 40.0–52.0)
Hemoglobin: 12 g/dL — ABNORMAL LOW (ref 13.0–18.0)
MCH: 30.4 pg (ref 26.0–34.0)
MCHC: 33.7 g/dL (ref 32.0–36.0)
MCV: 90.1 fL (ref 80.0–100.0)
PLATELETS: 155 10*3/uL (ref 150–440)
RBC: 3.95 MIL/uL — ABNORMAL LOW (ref 4.40–5.90)
RDW: 13.5 % (ref 11.5–14.5)
WBC: 5 10*3/uL (ref 3.8–10.6)

## 2016-07-22 LAB — TROPONIN I
TROPONIN I: 0.04 ng/mL — AB (ref <0.03)
Troponin I: 0.03 ng/mL (ref <0.03)

## 2016-07-22 MED ORDER — ISOSORBIDE MONONITRATE ER 60 MG PO TB24: 30.0000 mg | ORAL_TABLET | Freq: Every day | ORAL | Status: DC

## 2016-07-22 MED ORDER — ISOSORBIDE MONONITRATE ER 60 MG PO TB24
30.0000 mg | ORAL_TABLET | ORAL | Status: AC
  Administered 2016-07-22: 30 mg via ORAL
  Filled 2016-07-22: qty 1

## 2016-07-22 MED ORDER — ISOSORBIDE MONONITRATE ER 30 MG PO TB24: 30.0000 mg | ORAL_TABLET | Freq: Every day | ORAL | 0 refills | Status: DC

## 2016-07-22 NOTE — ED Provider Notes (Signed)
[facility] Emergency Department Provider Note  ____________________________________________  Time seen: Approximately 7:53 PM  I have reviewed the triage vital signs and the nursing notes.   HISTORY  Chief Complaint  Chest Pain    HPI Dennis Burns is a 83 y.o. male who complains of central chest tightness with exertion for the past several months, worse in the last 3-4 days. Happens when he walks and does other exertion. He gets better when he rests. Associated with shortness of breath. No diaphoresis or vomiting. Currently symptom free at rest. Normal oral intake. No dizziness or syncope. Pain is moderate intensity when present and nonradiating.  October 2017 had a echocardiogram which showed aortic stenosis and mitral regurgitation. September 2017 had a nuclear medicine myocardial perfusion study which showed possibly a region of moderate ischemia in the lateral wall.  Patient reports that overall he is not concerned with these symptoms as they are similar to his normal pattern but his wife is been discussing his symptoms with a retired primary care physician who lives in their retirement community who encouraged him to come to the ER for evaluation tonight.   Past Medical History:  Diagnosis Date  . Allergic rhinitis due to pollen    as child--better now  . Chronic kidney disease, stage III (moderate)   . Coronary artery disease   . DVT, lower extremity, recurrent ([place])   . Glaucoma    [facility]   . History of prostate cancer 2004  . Hyperlipidemia   . Hypertension   . Hypothyroidism   . Impaired fasting glucose   . Spinal stenosis of lumbar region with radiculopathy      Patient Active Problem List   Diagnosis Date Noted  . Preventative health care 05/03/2016  . Pulmonary hypertension ([place]) 05/03/2016  . PAD (peripheral artery disease) ([place]) 04/18/2016  . DOE (dyspnea on exertion) 11/02/2015  . Spinal stenosis of lumbar region with  radiculopathy   . Coronary artery disease   . History of prostate cancer   . Hyperlipidemia   . Hypertension   . Chronic kidney disease, stage III (moderate)   . DVT, lower extremity, recurrent ([place])   . Glaucoma   . Hypothyroidism   . Impaired fasting glucose      Past Surgical History:  Procedure Laterality Date  . CAROTID ENDARTERECTOMY Right 09/2004   [place] GRAFT  2003   [place]   after fall  . INSERTION PROSTATE RADIATION SEED  2004   and RT  . POSTERIOR LUMBAR FUSION  2012, 2014     Prior to Admission medications   Medication Sig Start Date End Date Taking? Authorizing Provider  Alpha-Lipoic Acid 200 MG CAPS Take 1 capsule by mouth daily.   Yes [provider]  atorvastatin (LIPITOR) 80 MG tablet Take 1 tablet (80 mg total) by mouth daily. 01/12/16  Yes Gollan, Kathlene November, MD  CALCIUM CITRATE PO Take 1 tablet by mouth daily. Takes 300mg  daily.   Yes [provider]  carvedilol (COREG) 6.25 MG tablet Take 1 tablet (6.25 mg total) by mouth 2 (two) times daily. 05/22/16  Yes Minna Merritts, MD  Cholecalciferol (VITAMIN D) 2000 units CAPS Take 1 capsule by mouth daily.   Yes [provider]  Coenzyme Q10 (CO Q10) 200 MG CAPS Take 1 capsule by mouth daily.   Yes [provider]  CRANBERRY EXTRACT PO Take 1 tablet  by mouth daily. Takes 1500mg  daily   Yes [provider]  ezetimibe (ZETIA) 10 MG tablet Take 1 tablet (10 mg total) by mouth daily. 12/07/15  Yes Gollan, Kathlene November, MD  furosemide (LASIX) 40 MG tablet Take 1 tablet (40 mg total) by mouth 2 (two) times daily. 1 tablet at noon and 1 at 6pm Patient taking differently: Take 40 mg by mouth daily.  03/07/16  Yes Gollan, Kathlene November, MD  latanoprost (XALATAN) 0.005 % ophthalmic solution Place 1 drop into both eyes at bedtime. In morning 10/27/15  Yes [provider]  levothyroxine (SYNTHROID, LEVOTHROID) 75  MCG tablet Take 1 tablet (75 mcg total) by mouth daily. 07/21/16  Yes [name], MD  Multiple Vitamin (MULTIVITAMIN) tablet Take 1 tablet by mouth daily.   Yes [provider]  NIFEdipine (PROCARDIA-XL/ADALAT CC) 60 MG 24 hr tablet Take 1 tablet (60 mg total) by mouth 2 (two) times daily. 04/10/16  Yes Minna Merritts, MD  Omega 3-6-9 Fatty Acids (OMEGA-3-6-9 PO) Take 1 capsule by mouth 2 (two) times daily.   Yes [provider]  oxyCODONE (OXY IR/ROXICODONE) 5 MG immediate release tablet Take 1 tablet by mouth 2 (two) times daily as needed. 09/13/15  Yes [provider]  Rivaroxaban (XARELTO) 15 MG TABS tablet Take 1 tablet (15 mg total) by mouth daily with supper. 11/09/15  Yes Gollan, Kathlene November, MD  timolol (TIMOPTIC) 0.25 % ophthalmic solution Place 1 drop into both eyes daily. am 08/14/15  Yes [provider]  traMADol (ULTRAM) 50 MG tablet Take 2 tablets (100 mg total) by mouth 3 (three) times daily as needed. 06/07/16  Yes [name], MD  valsartan (DIOVAN) 80 MG tablet Take 1 tablet (80 mg total) by mouth 2 (two) times daily. 01/12/16  Yes Minna Merritts, MD  isosorbide mononitrate (IMDUR) 30 MG 24 hr tablet Take 1 tablet (30 mg total) by mouth daily. 07/22/16 07/22/17  Carrie Mew, MD     Allergies Patient has no known allergies.   Family History  Problem Relation Age of Onset  . Cancer Mother        colon (cause of death) and breast  . Glaucoma Mother   . Cancer Father        colon  . Stroke Maternal Grandfather   . Diabetes Neg Hx     Social History Social History  Substance Use Topics  . Smoking status: Former Smoker    Quit date: 1978  . Smokeless tobacco: Never Used  . Alcohol use Yes    Review of Systems  Constitutional:   No fever or chills.  ENT:   No sore throat. No rhinorrhea. Cardiovascular:   Positive as above for chest pain. No syncope.Marland Kitchen Respiratory:   Positive shortness of breath when having chest pain  as above. No cough.. Gastrointestinal:   Negative for abdominal pain, vomiting and diarrhea.  Musculoskeletal:   Negative for focal pain or swelling All other systems reviewed and are negative except as documented above in ROS and HPI.  ____________________________________________   PHYSICAL EXAM:  VITAL SIGNS: ED Triage Vitals [07/22/16 1739]  Enc Vitals Group     BP (!) 160/65     Pulse Rate (!) 54     Resp 18     Temp 98.2 F (36.8 C)     Temp Source Oral     SpO2 96 %     Weight 139 lb (63 kg)     Height  5\' 3"  (1.6 m)     Head Circumference      Peak Flow      Pain Score 0     Pain Loc      Pain Edu?      Excl. in [place]?     Vital signs reviewed, nursing assessments reviewed.   Constitutional:   Alert and oriented. Well appearing and in no distress. Eyes:   No scleral icterus. No conjunctival pallor. PERRL. EOMI.  No nystagmus. ENT   Head:   Normocephalic and atraumatic.   Nose:   No congestion/rhinnorhea.    Mouth/Throat:   MMM, no pharyngeal erythema. No peritonsillar mass.    Neck:   No meningismus. Full ROM Hematological/Lymphatic/Immunilogical:   No cervical lymphadenopathy. Cardiovascular:   RRR. Symmetric bilateral radial and DP pulses.  No murmurs.  Respiratory:   Normal respiratory effort without tachypnea/retractions. Breath sounds are clear and equal bilaterally. No wheezes/rales/rhonchi. Gastrointestinal:   Soft and nontender. Non distended. There is no CVA tenderness.  No rebound, rigidity, or guarding. Genitourinary:   deferred Musculoskeletal:   Normal range of motion in all extremities. No joint effusions.  No lower extremity tenderness.  No edema. Neurologic:   Normal speech and language.  CN 2-10 normal. Motor grossly intact. No gross focal neurologic deficits are appreciated.  Skin:    Skin is warm, dry and intact. No rash noted.  No petechiae, purpura, or bullae.  ____________________________________________    LABS (pertinent  positives/negatives) (all labs ordered are listed, but only abnormal results are displayed) Labs Reviewed  BASIC METABOLIC PANEL - Abnormal; Notable for the following:       Result Value   BUN 53 (*)    Creatinine, Ser 1.66 (*)    Calcium 8.7 (*)    GFR calc non Af Amer 36 (*)    GFR calc Af Amer 42 (*)    All other components within normal limits  CBC - Abnormal; Notable for the following:    RBC 3.95 (*)    Hemoglobin 12.0 (*)    HCT 35.6 (*)    All other components within normal limits  TROPONIN I - Abnormal; Notable for the following:    Troponin I 0.04 (*)    All other components within normal limits  TROPONIN I - Abnormal; Notable for the following:    Troponin I 0.03 (*)    All other components within normal limits   ____________________________________________   EKG  Interpreted by me Sinus bradycardia rate of 57, normal axis and intervals. Normal QRS and ST segments. T wave inversions in 1 aVL. No significant change from 12/07/2015  ____________________________________________    RADIOLOGY  Dg Chest 2 View  Result Date: 07/22/2016 CLINICAL DATA:  Chest pain EXAM: CHEST  2 VIEW COMPARISON:  None. FINDINGS: Cardiac shadow is at the upper limits of normal in size. Aortic calcifications are noted. The lungs are well aerated bilaterally. Mild interstitial changes are seen without focal infiltrate. Old right rib fractures are seen. Chronic Compression deformity is noted at the thoracolumbar junction with prior posterior fixation. IMPRESSION: No acute abnormality noted. Electronically Signed   By: Inez Catalina M.D.   On: 07/22/2016 18:11    ____________________________________________   PROCEDURES Procedures  ____________________________________________   INITIAL IMPRESSION / ASSESSMENT AND PLAN / ED COURSE  Pertinent labs & imaging results that were available during my care of the patient were reviewed by me and considered in my medical decision making (see  chart for details).  Clinical Course as of Jul 23 2214  Sat Jul 22, 2016  1815 RDW: 13.5 [PS]  8251 Pt presents with sx c/w angina. Sx free at rest in the ED. Will check delta trop, d/w cardiologist.   [PS]  2003 Discussed with cards Dr. Rayann Heman. Rec. Starting Imdur 30mg  daily, follow up in clinic on Monday ( 2 days)  [PS]    Clinical Course User Index [PS] Carrie Mew, MD     ----------------------------------------- 10:15 PM on 07/22/2016 -----------------------------------------  Repeat troponin decreased to 0.03. Not concerning for acute ischemia.Considering the patient's symptoms, medical history, and physical examination today, I have low suspicion for ACS, PE, TAD, pneumothorax, carditis, mediastinitis, pneumonia, CHF, or sepsis.  Continue Imdur, follow-up with cardiology. Return precautions given.  ____________________________________________   FINAL CLINICAL IMPRESSION(S) / ED DIAGNOSES  Final diagnoses:  Nonspecific chest pain  Angina pectoris (HCC)      New Prescriptions   ISOSORBIDE MONONITRATE (IMDUR) 30 MG 24 HR TABLET    Take 1 tablet (30 mg total) by mouth daily.     Portions of this note were generated with dragon dictation software. Dictation errors may occur despite best attempts at proofreading.    Carrie Mew, MD 07/22/16 2216

## 2016-07-22 NOTE — ED Notes (Addendum)
Date and time results received: 07/22/16   Test: Troponin  Critical Value: 0.04

## 2016-07-22 NOTE — ED Triage Notes (Signed)
Pt reports chest pain that is intermittent for the past several months. Has seen Dr. [name] for cards and has had recent stress test that was normal. Pt states chest pain is worse when he is walking fast. C/o shortness of breath as well. Pt has hx by-pass. Lives at [place].

## 2016-07-25 ENCOUNTER — Telehealth: Payer: Self-pay

## 2016-07-25 ENCOUNTER — Ambulatory Visit (INDEPENDENT_AMBULATORY_CARE_PROVIDER_SITE_OTHER): Payer: Medicare PPO | Admitting: Cardiovascular Disease

## 2016-07-25 ENCOUNTER — Encounter: Payer: Self-pay | Admitting: Cardiovascular Disease

## 2016-07-25 VITALS — BP 138/60 | HR 64 | Ht 63.0 in | Wt 144.8 lb

## 2016-07-25 DIAGNOSIS — I2510 Atherosclerotic heart disease of native coronary artery without angina pectoris: Secondary | ICD-10-CM | POA: Diagnosis not present

## 2016-07-25 DIAGNOSIS — E7800 Pure hypercholesterolemia, unspecified: Secondary | ICD-10-CM | POA: Diagnosis not present

## 2016-07-25 DIAGNOSIS — I10 Essential (primary) hypertension: Secondary | ICD-10-CM

## 2016-07-25 DIAGNOSIS — I739 Peripheral vascular disease, unspecified: Secondary | ICD-10-CM | POA: Diagnosis not present

## 2016-07-25 DIAGNOSIS — I82401 Acute embolism and thrombosis of unspecified deep veins of right lower extremity: Secondary | ICD-10-CM

## 2016-07-25 DIAGNOSIS — I272 Pulmonary hypertension, unspecified: Secondary | ICD-10-CM

## 2016-07-25 MED ORDER — ISOSORBIDE MONONITRATE ER 30 MG PO TB24: 30.0000 mg | ORAL_TABLET | Freq: Every day | ORAL | 3 refills | Status: DC

## 2016-07-25 MED ORDER — NITROGLYCERIN 0.4 MG SL SUBL: 0.4000 mg | SUBLINGUAL_TABLET | SUBLINGUAL | 3 refills | Status: DC | PRN

## 2016-07-25 NOTE — Patient Instructions (Addendum)
Medication Instructions:    Please continue imdur 30 mg daily  Take nitro as needed  Labwork:  No new labs needed  Testing/Procedures:  No further testing at this time   I recommend watching educational videos on topics of interest to you at:       [email or web address]  Enter code: [email or web address]    Follow-Up: It was a pleasure seeing you in the office today. Please call us if you have new issues that need to be addressed before your next appt.  [phone]  Your physician wants you to follow-up in: 6 months.  You will receive a reminder letter in the mail two months in advance. If you don't receive a letter, please call our office to schedule the follow-up appointment.  If you need a refill on your cardiac medications before your next appointment, please call your pharmacy.

## 2016-07-25 NOTE — Progress Notes (Signed)
Cardiology Office Note  Date:  07/25/2016   ID:  [name], DOB Jun 21, 1928, MRN [id number]  PCP:  [name], MD   Chief Complaint  Patient presents with  . other    ED follow up. Patient denies chest pain. Meds reviewed verbally with patient.    Last seen in clinic February 2018  HPI:  Mr. Popowski is a pleasant 83 year old gentleman with history of  Chronic shortness of breath  mild aortic valve stenosis/moderate MR/moderate pulmonary hypertension,  PAD, s/p CEA on the right,  Hyperlipidemia,  hypertension,  coronary artery disease, CABG x 4 in 2003,  Chronic renal insufficiency,  atrophic kidney,  living off one kidney, seen by nephrology,Dr. Candiss Norse chronic back pain, sciatica, spinal stenosis, back surgery 2  prostate cancer, radioactive seeds  DVT in right leg noted before 2012 surgery, on  xarelto echocardiogram October 2017 showing mild to moderate aortic valve stenosis, moderate MR, moderately elevated right heart pressures, ejection fraction 55% or greater prior smoking history less than 10 years when he was younger  who presents for follow-up of his coronary disease and PAD chronic shortness of breath,   Seen in the emergency room 07/22/2016 for chest pain This occurred when he was rushing and had significant shortness of breath Last stress test September 2017, no significant ischemia on review of images by myself Blood pressure elevated in the emergency room Creatinine up to 1.66, BUN 53  continues to have stable but moderate intensity shortness of breath, worse on exertion.  Recent episode of chest discomfort on extreme exertion   chronic back pain, now with numbness down his legs Sees pain management, taking tramadol, oxy BID prn  Other past medical history reviewed Previous noninvasive vascular evaluation which showed normal ABI on the right side and mildly reduced on the left at 0.71. Duplex showed significant bilateral common iliac artery stenosis  as well as severe left SFA stenosis.  On a prior office visit He was participating in exercise program such as  "lifting with liz" He was doing Weight and rubber bands for one hour Getting more fit,  Was Able to walk further without stopping Then started doing less activity   total cholesterol around 140, LDL at goal on Lipitor and zetia  EKG on today's visit showing normal sinus rhythm with rate 64 bpm, T-wave abnormality V3 through V6, I and AVL  Other past medical history reviewed Carotid ultrasound reviewed with him showing 60-79% left carotid disease 40-59% disease on the right  Lower extremity arterial Doppler showing at least moderate common iliac arterial disease, severe disease left mid SFA On further discussion, likely having claudication type symptoms Mixed picture as he has neurologic issues as well from chronic back pain and sciatica   back surgery 2012 and 2014, he reports he is unable to walk  far    PMH:   has a past medical history of Allergic rhinitis due to pollen; Chronic kidney disease, stage III (moderate); Coronary artery disease; DVT, lower extremity, recurrent ([place]); Glaucoma; History of prostate cancer (2004); Hyperlipidemia; Hypertension; Hypothyroidism; Impaired fasting glucose; and Spinal stenosis of lumbar region with radiculopathy.  PSH:    Past Surgical History:  Procedure Laterality Date  . CAROTID ENDARTERECTOMY Right 09/2004   [place] GRAFT  2003   [place]   after fall  . INSERTION PROSTATE RADIATION SEED  2004   and RT  . POSTERIOR LUMBAR FUSION  2012, 2014    Current Outpatient Prescriptions  Medication Sig Dispense Refill  . Alpha-Lipoic Acid 200 MG CAPS Take 1 capsule by mouth daily.    Marland Kitchen atorvastatin (LIPITOR) 80 MG tablet Take 1 tablet (80 mg total) by mouth daily. 90 tablet 3  . CALCIUM CITRATE PO Take 1 tablet by mouth daily. Takes 300mg  daily.    . carvedilol  (COREG) 6.25 MG tablet Take 1 tablet (6.25 mg total) by mouth 2 (two) times daily. 180 tablet 3  . Cholecalciferol (VITAMIN D) 2000 units CAPS Take 1 capsule by mouth daily.    . Coenzyme Q10 (CO Q10) 200 MG CAPS Take 1 capsule by mouth daily.    Marland Kitchen CRANBERRY EXTRACT PO Take 1 tablet by mouth daily. Takes 1500mg  daily    . ezetimibe (ZETIA) 10 MG tablet Take 1 tablet (10 mg total) by mouth daily. 90 tablet 4  . furosemide (LASIX) 40 MG tablet Take 1 tablet (40 mg total) by mouth 2 (two) times daily. 1 tablet at noon and 1 at 6pm (Patient taking differently: Take 40 mg by mouth daily. ) 180 tablet 3  . isosorbide mononitrate (IMDUR) 30 MG 24 hr tablet Take 1 tablet (30 mg total) by mouth daily. 10 tablet 0  . latanoprost (XALATAN) 0.005 % ophthalmic solution Place 1 drop into both eyes at bedtime. In morning    . levothyroxine (SYNTHROID, LEVOTHROID) 75 MCG tablet Take 1 tablet (75 mcg total) by mouth daily. 90 tablet 3  . Multiple Vitamin (MULTIVITAMIN) tablet Take 1 tablet by mouth daily.    Marland Kitchen NIFEdipine (PROCARDIA-XL/ADALAT CC) 60 MG 24 hr tablet Take 1 tablet (60 mg total) by mouth 2 (two) times daily. 180 tablet 3  . Omega 3-6-9 Fatty Acids (OMEGA-3-6-9 PO) Take 1 capsule by mouth 2 (two) times daily.    Marland Kitchen oxyCODONE (OXY IR/ROXICODONE) 5 MG immediate release tablet Take 1 tablet by mouth 2 (two) times daily as needed.    . Rivaroxaban (XARELTO) 15 MG TABS tablet Take 1 tablet (15 mg total) by mouth daily with supper. 90 tablet 3  . timolol (TIMOPTIC) 0.25 % ophthalmic solution Place 1 drop into both eyes daily. am    . traMADol (ULTRAM) 50 MG tablet Take 2 tablets (100 mg total) by mouth 3 (three) times daily as needed. 180 tablet 2  . valsartan (DIOVAN) 80 MG tablet Take 1 tablet (80 mg total) by mouth 2 (two) times daily. 180 tablet 3   No current facility-administered medications for this visit.      Allergies:   Patient has no known allergies.   Social History:  The patient  reports  that he quit smoking about 40 years ago. He has never used smokeless tobacco. He reports that he drinks alcohol. He reports that he does not use drugs.   Family History:   family history includes Cancer in his father and mother; Glaucoma in his mother; Stroke in his maternal grandfather.    Review of Systems: Review of Systems  Constitutional: Negative.   Respiratory: Positive for shortness of breath.   Cardiovascular: Negative.   Gastrointestinal: Negative.   Musculoskeletal: Positive for back pain.  Neurological: Negative.        Numbness down his legs bilaterally  Psychiatric/Behavioral: Negative.   All other systems reviewed and are negative.    PHYSICAL EXAM: VS:  BP 138/60 (BP Location: Left Arm, Patient Position: Sitting, Cuff Size: Normal)   Pulse 64   Ht 5\' 3"  (1.6 m)  Wt 144 lb 12 oz (65.7 kg)   BMI 25.64 kg/m  , BMI Body mass index is 25.64 kg/m.  GEN: Well nourished, well developed, in no acute distress  HEENT: normal  Neck: no JVD, carotid bruits, or masses Cardiac: RRR; no murmurs, rubs, or gallops,no edema  Respiratory:  Scant crackles in the bases anteriorly, noted on the left otherwise relatively clear, normal work of breathing GI: soft, nontender, nondistended, + BS MS: no deformity or atrophy  Skin: warm and dry, no rash Neuro:  Strength and sensation are intact Psych: euthymic mood, full affect    Recent Labs: 02/07/2016: ALT 29 05/03/2016: TSH 0.86 07/22/2016: BUN 53; Creatinine, Ser 1.66; Hemoglobin 12.0; Platelets 155; Potassium 4.5; Sodium 140    Lipid Panel Lab Results  Component Value Date   CHOL 136 02/07/2016   HDL 54 02/07/2016   LDLCALC 64 02/07/2016   TRIG 92 02/07/2016      Wt Readings from Last 3 Encounters:  07/25/16 144 lb 12 oz (65.7 kg)  07/22/16 139 lb (63 kg)  05/03/16 140 lb 4 oz (63.6 kg)       ASSESSMENT AND PLAN:  Coronary artery disease involving native coronary artery of native heart without angina pectoris  - Plan: EKG 73-ALPF, Basic Metabolic Panel (BMET) Recent episode of chest discomfort in the setting of aggressive walking Discussed risk and benefit of cardiac catheterization with him. He would be high risk given underlying renal failure. He is indicated he would like to continue on isosorbide 30 mg daily and monitor his symptoms for now. Isosorbide could be increased as blood pressure tolerates  Pure hypercholesterolemia - Plan: EKG 79-KWIO, Basic Metabolic Panel (BMET) Cholesterol is at goal on the current lipid regimen. No changes to the medications were made.  Essential hypertension - Plan: EKG 97-DZHG, Basic Metabolic Panel (BMET) Tolerating isosorbide mononitrate 30 mg daily  SOB (shortness of breath) - Plan: EKG 99-MEQA, Basic Metabolic Panel (BMET) Most of the visit was spent discussing shortness of breath symptoms Likely multifactorial including mild to moderate aortic valve disease, moderate mitral valve regurgitation, moderately elevated right heart pressures consistent with moderate pulmonary hypertension. Unable to exclude some deconditioning   Recurrent deep vein thrombosis (DVT) of right lower extremity (HCC) on anticoagulation , no bleeding  Spinal stenosis of lumbar region with radiculopathy complaining about lower extremity neuropathy, getting worse   Chronic kidney disease, stage III (moderate) Followed by Dr. Candiss Norse  PAD (peripheral artery disease) [facility]) Recently seen by Dr.  Fletcher Anon lower extremity arterial disease. Stable at this time    Total encounter time more than 25 minutes  Greater than 50% was spent in counseling and coordination of care with the patient   Disposition:   F/U  6 months   Orders Placed This Encounter  Procedures  . EKG 12-Lead     Signed, Esmond Plants, M.D., Ph.D. 07/25/2016  [place], [place]

## 2016-07-25 NOTE — Telephone Encounter (Signed)
Tried to call pt to follow-up on his recent ER visit for chest pain. He is actually seeing his cardiologist right now per Epic.

## 2016-07-31 ENCOUNTER — Encounter: Payer: Self-pay | Admitting: Cardiovascular Disease

## 2016-08-01 DIAGNOSIS — M65322 Trigger finger, left index finger: Secondary | ICD-10-CM | POA: Diagnosis not present

## 2016-08-01 DIAGNOSIS — M6530 Trigger finger, unspecified finger: Secondary | ICD-10-CM | POA: Diagnosis not present

## 2016-08-04 DIAGNOSIS — H401133 Primary open-angle glaucoma, bilateral, severe stage: Secondary | ICD-10-CM | POA: Diagnosis not present

## 2016-08-09 ENCOUNTER — Encounter: Payer: Self-pay | Admitting: Cardiovascular Disease

## 2016-08-14 ENCOUNTER — Ambulatory Visit: Payer: Medicare PPO | Admitting: Cardiovascular Disease

## 2016-08-16 DIAGNOSIS — N261 Atrophy of kidney (terminal): Secondary | ICD-10-CM | POA: Diagnosis not present

## 2016-08-16 DIAGNOSIS — I10 Essential (primary) hypertension: Secondary | ICD-10-CM | POA: Diagnosis not present

## 2016-08-16 DIAGNOSIS — R600 Localized edema: Secondary | ICD-10-CM | POA: Diagnosis not present

## 2016-08-16 DIAGNOSIS — N183 Chronic kidney disease, stage 3 (moderate): Secondary | ICD-10-CM | POA: Diagnosis not present

## 2016-08-18 ENCOUNTER — Encounter: Payer: Self-pay | Admitting: Cardiovascular Disease

## 2016-09-13 ENCOUNTER — Telehealth: Payer: Self-pay | Admitting: Cardiovascular Disease

## 2016-09-13 NOTE — Telephone Encounter (Signed)
Patient overdue for fu with Dr. Fletcher Anon.  Ptient est with Gollan and doesn't want to see Fletcher Anon again.  Deleting Recall.

## 2016-10-03 ENCOUNTER — Encounter: Payer: Self-pay | Admitting: Internal Medicine

## 2016-10-04 NOTE — Telephone Encounter (Signed)
Not sure how we did this before Can't do electronically Let him know that we are not doing refills for controlled substances--but I can give 3 month supply (#540) if [employer] will accept that

## 2016-10-04 NOTE — Telephone Encounter (Signed)
Because of where [place] is located, last time they had [place] fax a rx for #180 and 2 refills instead of giving him the entire 540 at once.

## 2016-10-04 NOTE — Telephone Encounter (Signed)
Last written 06-05-16 #180/2 Last OV 05-03-16 No Future OV

## 2016-10-05 MED ORDER — TRAMADOL HCL 50 MG PO TABS: 100.0000 mg | ORAL_TABLET | Freq: Three times a day (TID) | ORAL | 2 refills | Status: DC | PRN

## 2016-10-05 NOTE — Telephone Encounter (Signed)
Rx faxed to [facility] 

## 2016-10-05 NOTE — Telephone Encounter (Signed)
Okay to do it that way then

## 2016-10-17 ENCOUNTER — Ambulatory Visit: Payer: Medicare PPO | Admitting: Cardiovascular Disease

## 2016-10-22 ENCOUNTER — Encounter: Payer: Self-pay | Admitting: Cardiovascular Disease

## 2016-10-27 ENCOUNTER — Other Ambulatory Visit: Payer: Self-pay | Admitting: *Deleted

## 2016-10-27 MED ORDER — RIVAROXABAN 15 MG PO TABS: 15.0000 mg | ORAL_TABLET | Freq: Every day | ORAL | 3 refills | Status: DC

## 2016-11-03 ENCOUNTER — Telehealth: Payer: Self-pay | Admitting: Cardiovascular Disease

## 2016-11-03 NOTE — Telephone Encounter (Signed)
Lm with spouse to schedule fu carotid US

## 2016-11-07 DIAGNOSIS — H401133 Primary open-angle glaucoma, bilateral, severe stage: Secondary | ICD-10-CM | POA: Diagnosis not present

## 2016-11-16 ENCOUNTER — Other Ambulatory Visit: Payer: Self-pay | Admitting: Cardiovascular Disease

## 2016-11-16 DIAGNOSIS — I6523 Occlusion and stenosis of bilateral carotid arteries: Secondary | ICD-10-CM

## 2016-11-22 ENCOUNTER — Encounter: Payer: Self-pay | Admitting: Family Medicine

## 2016-11-22 ENCOUNTER — Ambulatory Visit (INDEPENDENT_AMBULATORY_CARE_PROVIDER_SITE_OTHER): Payer: Medicare PPO | Admitting: Family Medicine

## 2016-11-22 VITALS — BP 118/72 | HR 57 | Temp 97.3°F | Ht 63.0 in | Wt 144.8 lb

## 2016-11-22 DIAGNOSIS — J069 Acute upper respiratory infection, unspecified: Secondary | ICD-10-CM | POA: Diagnosis not present

## 2016-11-22 NOTE — Patient Instructions (Signed)
Great to see you.  Please take Tylenol and tramadol as needed.  Also drink some warm tea with honey.

## 2016-11-22 NOTE — Progress Notes (Signed)
SUBJECTIVE:  Dennis Burns is a 83 y.o. male who complains of coryza, congestion, sore throat, dry cough, myalgias, headache and bilateral sinus pain for 5 days. He denies a history of chest pain and denies a history of asthma. Patient denies smoke cigarettes.   Current Outpatient Prescriptions on File Prior to Visit  Medication Sig Dispense Refill  . Alpha-Lipoic Acid 200 MG CAPS Take 1 capsule by mouth daily.    Marland Kitchen atorvastatin (LIPITOR) 80 MG tablet Take 1 tablet (80 mg total) by mouth daily. 90 tablet 3  . CALCIUM CITRATE PO Take 1 tablet by mouth daily. Takes 300mg  daily.    . carvedilol (COREG) 6.25 MG tablet Take 1 tablet (6.25 mg total) by mouth 2 (two) times daily. 180 tablet 3  . Cholecalciferol (VITAMIN D) 2000 units CAPS Take 1 capsule by mouth daily.    . Coenzyme Q10 (CO Q10) 200 MG CAPS Take 1 capsule by mouth daily.    Marland Kitchen CRANBERRY EXTRACT PO Take 1 tablet by mouth daily. Takes 1500mg  daily    . ezetimibe (ZETIA) 10 MG tablet Take 1 tablet (10 mg total) by mouth daily. 90 tablet 4  . furosemide (LASIX) 40 MG tablet Take 1 tablet (40 mg total) by mouth 2 (two) times daily. 1 tablet at noon and 1 at 6pm (Patient taking differently: Take 40 mg by mouth daily. ) 180 tablet 3  . isosorbide mononitrate (IMDUR) 30 MG 24 hr tablet Take 1 tablet (30 mg total) by mouth daily. 90 tablet 3  . latanoprost (XALATAN) 0.005 % ophthalmic solution Place 1 drop into both eyes at bedtime. In morning    . levothyroxine (SYNTHROID, LEVOTHROID) 75 MCG tablet Take 1 tablet (75 mcg total) by mouth daily. 90 tablet 3  . Multiple Vitamin (MULTIVITAMIN) tablet Take 1 tablet by mouth daily.    Marland Kitchen NIFEdipine (PROCARDIA-XL/ADALAT CC) 60 MG 24 hr tablet Take 1 tablet (60 mg total) by mouth 2 (two) times daily. 180 tablet 3  . Omega 3-6-9 Fatty Acids (OMEGA-3-6-9 PO) Take 1 capsule by mouth 2 (two) times daily.    . Rivaroxaban (XARELTO) 15 MG TABS tablet Take 1 tablet (15 mg total) by mouth daily with supper. 90  tablet 3  . timolol (TIMOPTIC) 0.25 % ophthalmic solution Place 1 drop into both eyes daily. am    . traMADol (ULTRAM) 50 MG tablet Take 2 tablets (100 mg total) by mouth 3 (three) times daily as needed. 180 tablet 2  . valsartan (DIOVAN) 80 MG tablet Take 1 tablet (80 mg total) by mouth 2 (two) times daily. 180 tablet 3  . nitroGLYCERIN (NITROSTAT) 0.4 MG SL tablet Place 1 tablet (0.4 mg total) under the tongue every 5 (five) minutes as needed for chest pain. 25 tablet 3  . oxyCODONE (OXY IR/ROXICODONE) 5 MG immediate release tablet Take 1 tablet by mouth 2 (two) times daily as needed.     No current facility-administered medications on file prior to visit.     No Known Allergies  Past Medical History:  Diagnosis Date  . Allergic rhinitis due to pollen    as child--better now  . Chronic kidney disease, stage III (moderate)   . Coronary artery disease   . DVT, lower extremity, recurrent ([place])   . Glaucoma    [facility]   . History of prostate cancer 2004  . Hyperlipidemia   . Hypertension   . Hypothyroidism   . Impaired fasting glucose   . Spinal stenosis of lumbar region  with radiculopathy     Past Surgical History:  Procedure Laterality Date  . CAROTID ENDARTERECTOMY Right 09/2004   [place] GRAFT  2003   [place]   after fall  . INSERTION PROSTATE RADIATION SEED  2004   and RT  . POSTERIOR LUMBAR FUSION  2012, 2014    Family History  Problem Relation Age of Onset  . Cancer Mother        colon (cause of death) and breast  . Glaucoma Mother   . Cancer Father        colon  . Stroke Maternal Grandfather   . Diabetes Neg Hx     Social History   Social History  . Marital status: Married    Spouse name: N/A  . Number of children: 5  . Years of education: N/A   Occupational History  . Engineer/consultant--retired     Radio broadcast assistant   Social History Main Topics  . Smoking status: Former  Smoker    Quit date: 1978  . Smokeless tobacco: Never Used  . Alcohol use Yes  . Drug use: No  . Sexual activity: Not on file   Other Topics Concern  . Not on file   Social History Narrative   2nd marriage--- 1980   5 children--2 step children   12 grandchildren   72 great grandchildren      Has living will   Wife is health care POA   Would accept resuscitation   Not sure about tube feeds   The PMH, PSH, Social History, Family History, Medications, and allergies have been reviewed in [facility], and have been updated if relevant.  OBJECTIVE: BP 118/72 (BP Location: Left Arm, Patient Position: Sitting, Cuff Size: Normal)   Pulse (!) 57   Temp (!) 97.3 F (36.3 C) (Oral)   Ht 5\' 3"  (1.6 m)   Wt 144 lb 12.8 oz (65.7 kg)   SpO2 95%   BMI 25.65 kg/m   He appears well, vital signs are as noted. Ears normal.  Throat and pharynx normal.  Neck supple. No adenopathy in the neck. Nose is congested. Sinuses non tender. The chest is clear, without wheezes or rales.  ASSESSMENT:  viral upper respiratory illness  PLAN: Symptomatic therapy suggested: push fluids, rest and return office visit prn if symptoms persist or worsen. Lack of antibiotic effectiveness discussed with him. Call or return to clinic prn if these symptoms worsen or fail to improve as anticipated.

## 2016-12-03 ENCOUNTER — Encounter: Payer: Self-pay | Admitting: Internal Medicine

## 2016-12-08 ENCOUNTER — Ambulatory Visit (INDEPENDENT_AMBULATORY_CARE_PROVIDER_SITE_OTHER): Payer: Medicare PPO

## 2016-12-08 DIAGNOSIS — Z23 Encounter for immunization: Secondary | ICD-10-CM | POA: Diagnosis not present

## 2016-12-15 DIAGNOSIS — I10 Essential (primary) hypertension: Secondary | ICD-10-CM | POA: Diagnosis not present

## 2016-12-15 DIAGNOSIS — N261 Atrophy of kidney (terminal): Secondary | ICD-10-CM | POA: Diagnosis not present

## 2016-12-15 DIAGNOSIS — R739 Hyperglycemia, unspecified: Secondary | ICD-10-CM | POA: Diagnosis not present

## 2016-12-15 DIAGNOSIS — R600 Localized edema: Secondary | ICD-10-CM | POA: Diagnosis not present

## 2016-12-15 DIAGNOSIS — N183 Chronic kidney disease, stage 3 (moderate): Secondary | ICD-10-CM | POA: Diagnosis not present

## 2016-12-18 DIAGNOSIS — I10 Essential (primary) hypertension: Secondary | ICD-10-CM | POA: Diagnosis not present

## 2016-12-18 DIAGNOSIS — R600 Localized edema: Secondary | ICD-10-CM | POA: Diagnosis not present

## 2016-12-18 DIAGNOSIS — R739 Hyperglycemia, unspecified: Secondary | ICD-10-CM | POA: Diagnosis not present

## 2016-12-18 DIAGNOSIS — N183 Chronic kidney disease, stage 3 (moderate): Secondary | ICD-10-CM | POA: Diagnosis not present

## 2016-12-18 DIAGNOSIS — N261 Atrophy of kidney (terminal): Secondary | ICD-10-CM | POA: Diagnosis not present

## 2017-01-01 DIAGNOSIS — M65321 Trigger finger, right index finger: Secondary | ICD-10-CM | POA: Diagnosis not present

## 2017-01-01 DIAGNOSIS — M65322 Trigger finger, left index finger: Secondary | ICD-10-CM | POA: Diagnosis not present

## 2017-01-01 DIAGNOSIS — M65342 Trigger finger, left ring finger: Secondary | ICD-10-CM | POA: Diagnosis not present

## 2017-01-03 ENCOUNTER — Ambulatory Visit (INDEPENDENT_AMBULATORY_CARE_PROVIDER_SITE_OTHER): Payer: Medicare PPO

## 2017-01-03 DIAGNOSIS — I6523 Occlusion and stenosis of bilateral carotid arteries: Secondary | ICD-10-CM

## 2017-01-04 LAB — VAS US CAROTID
LCCAPDIAS: 15 cm/s
LCCAPSYS: 54 cm/s
LEFT ECA DIAS: -30 cm/s
LEFT VERTEBRAL DIAS: -20 cm/s
Left CCA dist dias: -20 cm/s
Left CCA dist sys: -108 cm/s
Left ICA dist dias: -30 cm/s
Left ICA dist sys: -172 cm/s
Left ICA prox dias: -48 cm/s
Left ICA prox sys: -245 cm/s
RCCADSYS: -108 cm/s
RCCAPDIAS: -10 cm/s
RIGHT VERTEBRAL DIAS: 21 cm/s
Right CCA prox sys: -69 cm/s

## 2017-01-22 NOTE — Progress Notes (Signed)
Cardiology Office Note  Date:  01/23/2017   ID:  [name], DOB [birth year], MRN [id number]  PCP:  [name], MD   Chief Complaint  Patient presents with  . OTHER    6 month f/u c/o fatigue, sob, neck/elbow pain and leg cramps. Meds reviewed verbally with pt.   Last seen in clinic February 2018  HPI:  Mr. Zehnder is a pleasant 83 year old gentleman with history of  Chronic shortness of breath  mild aortic valve stenosis/moderate MR/moderate pulmonary hypertension,  PAD, s/p CEA on the right,  Hyperlipidemia,  hypertension,  coronary artery disease, CABG x 4 in 2003,  Chronic renal insufficiency,  atrophic kidney,  living off one kidney, seen by nephrology,Dr. Candiss Norse chronic back pain, sciatica, spinal stenosis, back surgery 2  prostate cancer, radioactive seeds  DVT in right leg noted before 2012 surgery, on  xarelto echocardiogram October 2017 showing mild to moderate aortic valve stenosis, moderate MR, moderately elevated right heart pressures, ejection fraction 55% or greater prior smoking history less than 10 years when he was younger  who presents for follow-up of his coronary disease and PAD chronic shortness of breath,   Numerous issues to discuss on today's visit Wife presents with him today Poor energy in Am 3 bathroom breaks overnight Walking problems, back, neck leg pain SOB with activity Hands fall asleep Rare cramps Left elbow pain on stretching out Back and leg pain  Recent went to [place], unable to keep up with everybody else, limited by several different issues including breathing and arthritic issues  Followed by Dr. Candiss Norse Was told to decrease his diuretic pill Takes Lasix daily not twice a day  Wonders why his shortness of breath that is not getting better Wife feels it is about the same He would like it to go away and resume his full activities   total cholesterol around 140, LDL at goal on Lipitor and zetia  EKG personally  reviewed by myself on todays visit Shows sinus bradycardia rate 55 bpm ST and T wave abnormality V3 through V6, 1 and aVL  Other past medical history reviewed Seen in the emergency room 07/22/2016 for chest pain This occurred when he was rushing and had significant shortness of breath Last stress test September 2017, no significant ischemia on review of images by myself Blood pressure elevated in the emergency room Creatinine up to 1.66, BUN 53  continues to have stable but moderate intensity shortness of breath, worse on exertion.  Recent episode of chest discomfort on extreme exertion   chronic back pain, now with numbness down his legs Sees pain management, taking tramadol, oxy BID prn  Other past medical history reviewed Previous noninvasive vascular evaluation which showed normal ABI on the right side and mildly reduced on the left at 0.71. Duplex showed significant bilateral common iliac artery stenosis as well as severe left SFA stenosis.  On a prior office visit He was participating in exercise program such as  "lifting with liz" He was doing Weight and rubber bands for one hour Getting more fit,  Was Able to walk further without stopping Then started doing less activity   Other past medical history reviewed Carotid ultrasound reviewed with him showing 60-79% left carotid disease 40-59% disease on the right  Lower extremity arterial Doppler showing at least moderate common iliac arterial disease, severe disease left mid SFA On further discussion, likely having claudication type symptoms Mixed picture as he has neurologic issues as well from chronic back pain and  sciatica   back surgery 2012 and 2014, he reports he is unable to walk  far    PMH:   has a past medical history of Allergic rhinitis due to pollen, Chronic kidney disease, stage III (moderate) ([place]), Coronary artery disease, DVT, lower extremity, recurrent ([place]), Glaucoma, History of prostate cancer (2004),  Hyperlipidemia, Hypertension, Hypothyroidism, Impaired fasting glucose, and Spinal stenosis of lumbar region with radiculopathy.  PSH:    Past Surgical History:  Procedure Laterality Date  . CAROTID ENDARTERECTOMY Right 09/2004   [place] GRAFT  2003   [place]   after fall  . INSERTION PROSTATE RADIATION SEED  2004   and RT  . POSTERIOR LUMBAR FUSION  2012, 2014    Current Outpatient Medications  Medication Sig Dispense Refill  . Alpha-Lipoic Acid 200 MG CAPS Take 1 capsule by mouth daily.    Marland Kitchen atorvastatin (LIPITOR) 80 MG tablet Take 1 tablet (80 mg total) by mouth daily. 90 tablet 3  . CALCIUM CITRATE PO Take 1 tablet by mouth daily. Takes 300mg  daily.    . carvedilol (COREG) 6.25 MG tablet Take 1 tablet (6.25 mg total) by mouth 2 (two) times daily. 180 tablet 3  . Cholecalciferol (VITAMIN D) 2000 units CAPS Take 1 capsule by mouth daily.    . Coenzyme Q10 (CO Q10) 200 MG CAPS Take 1 capsule by mouth daily.    Marland Kitchen CRANBERRY EXTRACT PO Take 1 tablet by mouth daily. Takes 1500mg  daily    . ezetimibe (ZETIA) 10 MG tablet Take 1 tablet (10 mg total) by mouth daily. 90 tablet 4  . furosemide (LASIX) 40 MG tablet Take 1 tablet (40 mg total) by mouth 2 (two) times daily. 1 tablet at noon and 1 at 6pm 180 tablet 3  . isosorbide mononitrate (IMDUR) 30 MG 24 hr tablet Take 1 tablet (30 mg total) by mouth daily. 90 tablet 3  . latanoprost (XALATAN) 0.005 % ophthalmic solution Place 1 drop into both eyes at bedtime. In morning    . levothyroxine (SYNTHROID, LEVOTHROID) 75 MCG tablet Take 1 tablet (75 mcg total) by mouth daily. 90 tablet 3  . Multiple Vitamin (MULTIVITAMIN) tablet Take 1 tablet by mouth daily.    Marland Kitchen NIFEdipine (PROCARDIA-XL/ADALAT CC) 60 MG 24 hr tablet Take 1 tablet (60 mg total) by mouth 2 (two) times daily. 180 tablet 3  . Omega 3-6-9 Fatty Acids (OMEGA-3-6-9 PO) Take 1 capsule by mouth 2 (two) times daily.    Marland Kitchen  oxyCODONE (OXY IR/ROXICODONE) 5 MG immediate release tablet Take 1 tablet by mouth 2 (two) times daily as needed.    . Rivaroxaban (XARELTO) 15 MG TABS tablet Take 1 tablet (15 mg total) by mouth daily with supper. 90 tablet 3  . timolol (TIMOPTIC) 0.25 % ophthalmic solution Place 1 drop into both eyes daily. am    . traMADol (ULTRAM) 50 MG tablet Take 2 tablets (100 mg total) by mouth 3 (three) times daily as needed. 180 tablet 2  . valsartan (DIOVAN) 80 MG tablet Take 1 tablet (80 mg total) by mouth 2 (two) times daily. 180 tablet 3  . nitroGLYCERIN (NITROSTAT) 0.4 MG SL tablet Place 1 tablet (0.4 mg total) under the tongue every 5 (five) minutes as needed for chest pain. 25 tablet 3   No current facility-administered medications for this visit.      Allergies:   Patient has no known allergies.  Social History:  The patient  reports that he quit smoking about 40 years ago. he has never used smokeless tobacco. He reports that he drinks alcohol. He reports that he does not use drugs.   Family History:   family history includes Cancer in his father and mother; Glaucoma in his mother; Stroke in his maternal grandfather.    Review of Systems: Review of Systems  Constitutional: Positive for malaise/fatigue.  Respiratory: Positive for shortness of breath.   Cardiovascular: Negative.   Gastrointestinal: Negative.   Musculoskeletal: Positive for back pain and joint pain.  Neurological: Negative.        Numbness down his legs bilaterally  Psychiatric/Behavioral: Negative.   All other systems reviewed and are negative.    PHYSICAL EXAM: VS:  BP (!) 152/60 (BP Location: Left Arm, Patient Position: Sitting, Cuff Size: Normal)   Pulse (!) 55   Ht 5\' 3"  (1.6 m)   Wt 136 lb 4 oz (61.8 kg)   BMI 24.14 kg/m  , BMI Body mass index is 24.14 kg/m.  GEN: Well nourished, well developed, in no acute distress  HEENT: normal  Neck: no JVD, carotid bruits, or masses Cardiac: RRR; no murmurs,  rubs, or gallops,no edema  Respiratory: Relatively clear lung sounds, normal work of breathing GI: soft, nontender, nondistended, + BS MS: no deformity or atrophy  Skin: warm and dry, no rash Neuro:  Strength and sensation are intact Psych: euthymic mood, full affect    Recent Labs: 02/07/2016: ALT 29 05/03/2016: TSH 0.86 07/22/2016: BUN 53; Creatinine, Ser 1.66; Hemoglobin 12.0; Platelets 155; Potassium 4.5; Sodium 140    Lipid Panel Lab Results  Component Value Date   CHOL 136 02/07/2016   HDL 54 02/07/2016   LDLCALC 64 02/07/2016   TRIG 92 02/07/2016      Wt Readings from Last 3 Encounters:  01/23/17 136 lb 4 oz (61.8 kg)  11/22/16 144 lb 12.8 oz (65.7 kg)  07/25/16 144 lb 12 oz (65.7 kg)       ASSESSMENT AND PLAN:  Coronary artery disease involving native coronary artery of native heart without angina pectoris - Plan: EKG 61-PJKD, Basic Metabolic Panel (BMET) He reports having some profound shortness of breath, unable to walk very far without stopping Chronic issues though he would like continued aggressive surveillance Echocardiogram ordered to estimate ejection fraction, stress test ordered to rule out ischemia as a cause of his debilitating shortness of breath symptoms  Pure hypercholesterolemia - Plan: EKG 32-IZTI, Basic Metabolic Panel (BMET) Cholesterol is at goal on the current lipid regimen. No changes to the medications were made. He is having some leg cramping, he could cut the Lipitor in half daily  Essential hypertension - Plan: EKG 45-YKDX, Basic Metabolic Panel (BMET) Blood pressure is elevated today but he reports is well controlled at home 833 up to 825 systolic.  Recommended he monitor blood pressure at home and call our office if numbers run high  SOB (shortness of breath) - Plan: EKG 05-LZJQ, Basic Metabolic Panel (BMET) Most of the visit was spent discussing shortness of breath symptoms  multifactorial including aortic valve disease, moderate  mitral valve regurgitation, moderately elevated right heart pressures consistent with moderate pulmonary hypertension. Unable to exclude some deconditioning .  Stress test ordered to rule out ischemia Repeat echocardiogram ordered at his request to evaluate aortic valve and right heart pressures  Recurrent deep vein thrombosis (DVT) of right lower extremity (HCC) on anticoagulation , no bleeding Stable  Spinal stenosis of lumbar  region with radiculopathy complaining about lower extremity neuropathy, getting worse  Limiting his ability to exert himself  Chronic kidney disease, stage III (moderate) Followed by Dr. Johnna Acosta renal function  PAD (peripheral artery disease) [facility]) Previously seen by Dr.  Fletcher Anon for  lower extremity arterial disease.  Stable at this time .  Cramping only at rest   Total encounter time more than 45 minutes  Greater than 50% was spent in counseling and coordination of care with the patient   Disposition:   F/U  12 months   Orders Placed This Encounter  Procedures  . EKG 12-Lead     Signed, Esmond Plants, M.D., Ph.D. 01/23/2017  [place], [place]

## 2017-01-23 ENCOUNTER — Ambulatory Visit: Payer: Medicare PPO | Admitting: Cardiovascular Disease

## 2017-01-23 ENCOUNTER — Encounter: Payer: Self-pay | Admitting: Cardiovascular Disease

## 2017-01-23 VITALS — BP 152/60 | HR 55 | Ht 63.0 in | Wt 136.2 lb

## 2017-01-23 DIAGNOSIS — E7800 Pure hypercholesterolemia, unspecified: Secondary | ICD-10-CM

## 2017-01-23 DIAGNOSIS — I25118 Atherosclerotic heart disease of native coronary artery with other forms of angina pectoris: Secondary | ICD-10-CM | POA: Diagnosis not present

## 2017-01-23 DIAGNOSIS — I10 Essential (primary) hypertension: Secondary | ICD-10-CM

## 2017-01-23 DIAGNOSIS — I272 Pulmonary hypertension, unspecified: Secondary | ICD-10-CM | POA: Diagnosis not present

## 2017-01-23 DIAGNOSIS — I739 Peripheral vascular disease, unspecified: Secondary | ICD-10-CM | POA: Diagnosis not present

## 2017-01-23 DIAGNOSIS — R0602 Shortness of breath: Secondary | ICD-10-CM | POA: Diagnosis not present

## 2017-01-23 DIAGNOSIS — I82401 Acute embolism and thrombosis of unspecified deep veins of right lower extremity: Secondary | ICD-10-CM | POA: Diagnosis not present

## 2017-01-23 DIAGNOSIS — N183 Chronic kidney disease, stage 3 unspecified: Secondary | ICD-10-CM

## 2017-01-23 NOTE — Patient Instructions (Addendum)
Medication Instructions:   No medication changes made  Labwork:  No new labs needed  Testing/Procedures:  We will order an echocardiogram for aortic valve stenosis, mitral valve disease Your physician has requested that you have an echocardiogram. Echocardiography is a painless test that uses sound waves to create images of your heart. It provides your doctor with information about the size and shape of your heart and how well your heart's chambers and valves are working. This procedure takes approximately one hour. There are no restrictions for this procedure.     We will order a stress test for CAD, shortness of breath [place]  Your caregiver has ordered a Stress Test with nuclear imaging. The purpose of this test is to evaluate the blood supply to your heart muscle. This procedure is referred to as a "Non-Invasive Stress Test." This is because other than having an IV started in your vein, nothing is inserted or "invades" your body. Cardiac stress tests are done to find areas of poor blood flow to the heart by determining the extent of coronary artery disease (CAD). Some patients exercise on a treadmill, which naturally increases the blood flow to your heart, while others who are  unable to walk on a treadmill due to physical limitations have a pharmacologic/chemical stress agent called Lexiscan . This medicine will mimic walking on a treadmill by temporarily increasing your coronary blood flow.   Please note: these test may take anywhere between 2-4 hours to complete  PLEASE REPORT TO [place] AT THE FIRST DESK WILL DIRECT YOU WHERE TO GO  Date of Procedure:____Wednesday November 28th_____  Arrival Time for Procedure:__07:45 AM______  Instructions regarding medication:   __X__:  Hold Carvedilol, Isosorbide mononitrate, and Nifedipine the night before procedure and morning of procedure    PLEASE NOTIFY THE OFFICE AT LEAST 24 HOURS IN  ADVANCE IF YOU ARE UNABLE TO KEEP YOUR APPOINTMENT.  [phone] AND  PLEASE NOTIFY NUCLEAR MEDICINE AT [facility] AT LEAST 24 HOURS IN ADVANCE IF YOU ARE UNABLE TO KEEP YOUR APPOINTMENT. [phone]  How to prepare for your Myoview test:  1. Do not eat or drink after midnight 2. No caffeine for 24 hours prior to test 3. No smoking 24 hours prior to test. 4. Your medication may be taken with water.  If your doctor stopped a medication because of this test, do not take that medication. 5. Ladies, please do not wear dresses.  Skirts or pants are appropriate. Please wear a short sleeve shirt. 6. No perfume, cologne or lotion. 7. Wear comfortable walking shoes. No heels!      Follow-Up: It was a pleasure seeing you in the office today. Please call us if you have new issues that need to be addressed before your next appt.  [phone]  Your physician wants you to follow-up in: 12 months.  You will receive a reminder letter in the mail two months in advance. If you don't receive a letter, please call our office to schedule the follow-up appointment.  If you need a refill on your cardiac medications before your next appointment, please call your pharmacy.

## 2017-01-24 ENCOUNTER — Ambulatory Visit (INDEPENDENT_AMBULATORY_CARE_PROVIDER_SITE_OTHER): Payer: Medicare PPO

## 2017-01-24 ENCOUNTER — Other Ambulatory Visit: Payer: Self-pay

## 2017-01-24 DIAGNOSIS — R0602 Shortness of breath: Secondary | ICD-10-CM | POA: Diagnosis not present

## 2017-01-24 DIAGNOSIS — I25118 Atherosclerotic heart disease of native coronary artery with other forms of angina pectoris: Secondary | ICD-10-CM

## 2017-01-25 LAB — ECHOCARDIOGRAM COMPLETE
AO mean calculated velocity dopler: 195 cm/s
AOASC: 31 cm
AOVTI: 71.5 cm
AV Area mean vel: 0.99 cm2
AV Mean grad: 17 mmHg
AV area mean vel ind: 0.6 cm2/m2
AV pk vel: 267 cm/s
AV vel: 1.07
AVA: 1.07 cm2
AVAREAVTIIND: 0.65 cm2/m2
AVCELMEANRAT: 0.31
AVPG: 29 mmHg
Area-P 1/2: 4.15 cm2
CHL CUP AV VALUE AREA INDEX: 0.65
E decel time: 145 msec
EERAT: 17.59
FS: 25 % — AB (ref 28–44)
IV/PV OW: 1.3
LA vol A4C: 59 ml
LA vol index: 36 mL/m2
LA vol: 59.1 mL
LV PW d: 10 mm — AB (ref 0.6–1.1)
LV TDI E'LATERAL: 7.05
LV sys vol: 47 mL (ref 21–61)
LVDIAVOL: 94 mL (ref 62–150)
LVDIAVOLIN: 57 mL/m2
LVEEAVG: 17.59
LVEEMED: 17.59
LVELAT: 7.05 cm/s
LVOT VTI: 24.3 cm
LVOT area: 3.14 cm2
LVOT diameter: 20 mm
LVOT peak VTI: 0.34 cm
LVOTSV: 76 mL
LVSYSVOLIN: 28 mL/m2
Lateral S' vel: 11 cm/s
MV Dec: 145
MV Peak grad: 6 mmHg
MV pk E vel: 124 m/s
MVPKAVEL: 106 m/s
P 1/2 time: 354 ms
P 1/2 time: 43 ms
RV TAPSE: 18.3 mm
Simpson's disk: 50
Stroke v: 47 ml
TDI e' medial: 4.14

## 2017-01-31 ENCOUNTER — Encounter: Payer: Self-pay | Admitting: Cardiovascular Disease

## 2017-01-31 ENCOUNTER — Encounter
Admission: RE | Admit: 2017-01-31 | Discharge: 2017-01-31 | Disposition: A | Discharge status: Home / Self Care | Payer: Medicare PPO | Source: Ambulatory Visit | Attending: Cardiovascular Disease | Admitting: Cardiovascular Disease

## 2017-01-31 DIAGNOSIS — R0602 Shortness of breath: Secondary | ICD-10-CM | POA: Insufficient documentation

## 2017-01-31 LAB — NM MYOCAR MULTI W/SPECT W/WALL MOTION / EF
CHL CUP NUCLEAR SDS: 9
CHL CUP NUCLEAR SRS: 7
CHL CUP NUCLEAR SSS: 14
CHL CUP STRESS STAGE 1 HR: 64 {beats}/min
CHL CUP STRESS STAGE 2 HR: 64 {beats}/min
CHL CUP STRESS STAGE 3 GRADE: 0 %
CHL CUP STRESS STAGE 3 HR: 77 {beats}/min
CHL CUP STRESS STAGE 3 SPEED: 0 mph
CHL CUP STRESS STAGE 4 HR: 78 {beats}/min
CHL CUP STRESS STAGE 5 GRADE: 0 %
CHL CUP STRESS STAGE 5 SPEED: 0 mph
CSEPEW: 1 METS
CSEPPHR: 77 {beats}/min
LVDIAVOL: 143 mL (ref 62–150)
LVSYSVOL: 84 mL
NUC STRESS TID: 1.11
Percent HR: 60 %
Percent of predicted max HR: 57 %
Rest HR: 63 {beats}/min
Stage 1 Grade: 0 %
Stage 1 Speed: 0 mph
Stage 2 Grade: 0 %
Stage 2 Speed: 0 mph
Stage 4 Grade: 0 %
Stage 4 Speed: 0 mph
Stage 5 DBP: 56 mmHg
Stage 5 HR: 72 {beats}/min
Stage 5 SBP: 132 mmHg

## 2017-01-31 MED ORDER — REGADENOSON 0.4 MG/5ML IV SOLN
0.4000 mg | Freq: Once | INTRAVENOUS | Status: AC
  Administered 2017-01-31: 0.4 mg via INTRAVENOUS

## 2017-01-31 MED ORDER — TECHNETIUM TC 99M TETROFOSMIN IV KIT
13.0000 | PACK | Freq: Once | INTRAVENOUS | Status: AC | PRN
  Administered 2017-01-31: 12.704 via INTRAVENOUS

## 2017-01-31 MED ORDER — TECHNETIUM TC 99M TETROFOSMIN IV KIT
30.0000 | PACK | Freq: Once | INTRAVENOUS | Status: AC | PRN
Start: 2017-01-31 — End: 2017-01-31
  Administered 2017-01-31: 32.642 via INTRAVENOUS

## 2017-02-01 ENCOUNTER — Encounter: Payer: Self-pay | Admitting: Cardiovascular Disease

## 2017-02-04 DIAGNOSIS — R9439 Abnormal result of other cardiovascular function study: Secondary | ICD-10-CM | POA: Insufficient documentation

## 2017-02-04 NOTE — H&P (View-Only) (Signed)
Cardiology Office Note  Date:  02/05/2017   ID:  Dennis Burns, DOB 1929/02/23, MRN [id number]  PCP:  [name], MD   Chief Complaint  Patient presents with  . Other    Follow up from abnormal stress test. Meds reviewed by the pt. verbally. Pt. c/o shortness of breath.     HPI:  Dennis Burns is a pleasant 83 year old gentleman with history of  Chronic shortness of breath  mild aortic valve stenosis/moderate MR/moderate pulmonary hypertension,  PAD, s/p CEA on the right,  Duplex showed significant bilateral common iliac artery stenosis as well as severe left SFA stenosis. Hyperlipidemia,  hypertension,  coronary artery disease, CABG x 4 in 2003,  Chronic renal insufficiency,  atrophic kidney,  living off one kidney, seen by nephrology,Dr. Candiss Norse chronic back pain, sciatica, spinal stenosis, back surgery 2  prostate cancer, radioactive seeds  DVT in right leg noted before 2012 surgery, on  xarelto echocardiogram October 2017 showing mild to moderate aortic valve stenosis, moderate MR, moderately elevated right heart pressures, ejection fraction 55% or greater prior smoking history less than 10 years when he was younger  who presents for follow-up of his coronary disease and PAD chronic shortness of breath,   In follow-up today he presents to discuss his recent stress test results He has inferolateral, lateral wall ischemia Ejection fraction normal on echo with depressed ejection fraction on stress test,   Continued significant shortness of breath Frustrated his breathing has not improved Tolerating beta-blockers, isosorbide  Recent went to [place], unable to keep up with everybody else, limited by several different issues including breathing  Followed by Dr. Candiss Norse Previous creatinine 1.48 in October  Takes Lasix daily    total cholesterol around 140, LDL at goal on Lipitor and zetia  Other past medical history reviewed Seen in the emergency room 07/22/2016 for  chest pain This occurred when he was rushing and had significant shortness of breath Last stress test September 2017, no significant ischemia on review of images by myself Blood pressure elevated in the emergency room Creatinine up to 1.66, BUN 53  continues to have stable but moderate intensity shortness of breath, worse on exertion.  Recent episode of chest discomfort on extreme exertion   chronic back pain, now with numbness down his legs Sees pain management, taking tramadol, oxy BID prn  Other past medical history reviewed Previous noninvasive vascular evaluation which showed normal ABI on the right side and mildly reduced on the left at 0.71.  Duplex showed significant bilateral common iliac artery stenosis as well as severe left SFA stenosis.  Other past medical history reviewed Carotid ultrasound reviewed with him showing 60-79% left carotid disease 40-59% disease on the right  Lower extremity arterial Doppler showing at least moderate common iliac arterial disease, severe disease left mid SFA   back surgery 2012 and 2014    PMH:   has a past medical history of Allergic rhinitis due to pollen, Chronic kidney disease, stage III (moderate) ([place]), Coronary artery disease, DVT, lower extremity, recurrent ([place]), Glaucoma, History of prostate cancer (2004), Hyperlipidemia, Hypertension, Hypothyroidism, Impaired fasting glucose, and Spinal stenosis of lumbar region with radiculopathy.  PSH:    Past Surgical History:  Procedure Laterality Date  . CAROTID ENDARTERECTOMY Right 09/2004   [place] GRAFT  2003   [place]   after fall  . INSERTION PROSTATE RADIATION SEED  2004   and RT  .  POSTERIOR LUMBAR FUSION  2012, 2014    Current Outpatient Medications  Medication Sig Dispense Refill  . Alpha-Lipoic Acid 200 MG CAPS Take 1 capsule by mouth daily.    Marland Kitchen atorvastatin (LIPITOR) 80 MG tablet Take 1 tablet (80 mg  total) by mouth daily. 90 tablet 3  . CALCIUM CITRATE PO Take 1 tablet by mouth daily. Takes 300mg  daily.    . carvedilol (COREG) 6.25 MG tablet Take 1 tablet (6.25 mg total) by mouth 2 (two) times daily. 180 tablet 3  . Cholecalciferol (VITAMIN D) 2000 units CAPS Take 4,000 Units by mouth daily.    . Coenzyme Q10 (CO Q10) 200 MG CAPS Take 1 capsule by mouth daily.    Marland Kitchen CRANBERRY EXTRACT PO Take 1 tablet by mouth daily. Takes 1500mg  daily    . ezetimibe (ZETIA) 10 MG tablet Take 1 tablet (10 mg total) by mouth daily. 90 tablet 4  . furosemide (LASIX) 40 MG tablet Take 40 mg by mouth daily.    . isosorbide mononitrate (IMDUR) 30 MG 24 hr tablet Take 1 tablet (30 mg total) by mouth daily. 90 tablet 3  . latanoprost (XALATAN) 0.005 % ophthalmic solution Place 1 drop into both eyes at bedtime. In morning    . levothyroxine (SYNTHROID, LEVOTHROID) 75 MCG tablet Take 1 tablet (75 mcg total) by mouth daily. 90 tablet 3  . Multiple Vitamin (MULTIVITAMIN) tablet Take 1 tablet by mouth daily.    Marland Kitchen NIFEdipine (PROCARDIA-XL/ADALAT CC) 60 MG 24 hr tablet Take 1 tablet (60 mg total) by mouth 2 (two) times daily. 180 tablet 3  . nitroGLYCERIN (NITROSTAT) 0.4 MG SL tablet Place 1 tablet (0.4 mg total) under the tongue every 5 (five) minutes as needed for chest pain. 25 tablet 3  . Omega 3-6-9 Fatty Acids (OMEGA-3-6-9 PO) Take 1 capsule by mouth 2 (two) times daily.    Marland Kitchen oxyCODONE (OXY IR/ROXICODONE) 5 MG immediate release tablet Take 1 tablet by mouth 2 (two) times daily as needed.    . Rivaroxaban (XARELTO) 15 MG TABS tablet Take 1 tablet (15 mg total) by mouth daily with supper. 90 tablet 3  . timolol (TIMOPTIC) 0.25 % ophthalmic solution Place 1 drop into both eyes 2 (two) times daily. am    . traMADol (ULTRAM) 50 MG tablet Take 2 tablets (100 mg total) by mouth 3 (three) times daily as needed. 180 tablet 2  . valsartan (DIOVAN) 80 MG tablet Take 1 tablet (80 mg total) by mouth 2 (two) times daily. 180 tablet  3   No current facility-administered medications for this visit.      Allergies:   Patient has no known allergies.   Social History:  The patient  reports that he quit smoking about 40 years ago. he has never used smokeless tobacco. He reports that he drinks alcohol. He reports that he does not use drugs.   Family History:   family history includes Cancer in his father and mother; Glaucoma in his mother; Stroke in his maternal grandfather.    Review of Systems: Review of Systems  Constitutional: Positive for malaise/fatigue.  Respiratory: Positive for shortness of breath.   Cardiovascular: Negative.   Gastrointestinal: Negative.   Musculoskeletal: Positive for back pain and joint pain.  Neurological: Negative.        Numbness down his legs bilaterally  Psychiatric/Behavioral: Negative.   All other systems reviewed and are negative.    PHYSICAL EXAM: VS:  BP (!) 124/58 (BP Location: Left Arm, Patient Position:  Sitting, Cuff Size: Normal)   Pulse 61   Ht 5\' 3"  (1.6 m)   Wt 141 lb 8 oz (64.2 kg)   BMI 25.07 kg/m  , BMI Body mass index is 25.07 kg/m.  GEN: Well nourished, well developed, in no acute distress  HEENT: normal  Neck: no JVD, + carotid bruits left >right, no masses Cardiac: RRR; no murmurs, rubs, or gallops,no edema  Respiratory: Relatively clear lung sounds, normal work of breathing GI: soft, nontender, nondistended, + BS MS: no deformity or atrophy  Skin: warm and dry, no rash Neuro:  Strength and sensation are intact Psych: euthymic mood, full affect    Recent Labs: 02/07/2016: ALT 29 05/03/2016: TSH 0.86 07/22/2016: BUN 53; Creatinine, Ser 1.66; Hemoglobin 12.0; Platelets 155; Potassium 4.5; Sodium 140    Lipid Panel Lab Results  Component Value Date   CHOL 136 02/07/2016   HDL 54 02/07/2016   LDLCALC 64 02/07/2016   TRIG 92 02/07/2016      Wt Readings from Last 3 Encounters:  02/05/17 141 lb 8 oz (64.2 kg)  01/23/17 136 lb 4 oz (61.8 kg)   11/22/16 144 lb 12.8 oz (65.7 kg)       ASSESSMENT AND PLAN:  Coronary artery disease involving native coronary artery of native heart without angina pectoris - Plan: EKG 38-HWEX, Basic Metabolic Panel (BMET) Positive stress test inferolateral, lateral, Some inferior Recommended cardiac catheterization I have reviewed the risks, indications, and alternatives to cardiac catheterization, possible angioplasty, and stenting with the patient. Risks include but are not limited to bleeding, infection, vascular injury, stroke, myocardial infection, arrhythmia, kidney injury, radiation-related injury in the case of prolonged fluoroscopy use, emergency cardiac surgery, and death. The patient understands the risks of serious complication is 1-2 in 9371 with diagnostic cardiac cath and 1-2% or less with angioplasty/stenting.  -Procedure will be scheduled for 8:30 on Wednesday, December 5 Recommended he hold Xarelto, Lasix, valsartan until after the procedure Recommended he arrived at 630 for pre-hydration given renal dysfunction Likely also need hydration following the procedure  Pure hypercholesterolemia - Plan: EKG 69-CVEL, Basic Metabolic Panel (BMET) Previously with leg cramping, recommended he decrease Lipitor down to 40 Cholesterol at goal  Essential hypertension - Plan: EKG 38-BOFB, Basic Metabolic Panel (BMET) Blood pressure is well controlled on today's visit. No changes made to the medications.  SOB (shortness of breath) - Plan: EKG 51-WCHE, Basic Metabolic Panel (BMET) Long history, worse recently, positive stress test Scheduled for cardiac catheterization as above  Recurrent deep vein thrombosis (DVT) of right lower extremity (HCC) on anticoagulation , no bleeding Stable .  We will stop Xarelto today in preparation for cardiac catheterization  Spinal stenosis of lumbar region with radiculopathy complaining about lower extremity neuropathy, stable  Chronic kidney disease, stage III  (moderate) Followed by Dr. Johnna Acosta renal function, creatinine 1.48  PAD (peripheral artery disease) (HCC) Stable at least moderate bilateral disease Previously seen by Dr. Fletcher Anon   Total encounter time more than 60 minutes  Greater than 50% was spent in counseling and coordination of care with the patient  Disposition:   F/U  1 months   No orders of the defined types were placed in this encounter.    Signed, Esmond Plants, M.D., Ph.D. 02/05/2017  [place], [place]

## 2017-02-04 NOTE — Progress Notes (Signed)
Cardiology Office Note  Date:  02/05/2017   ID:  Dennis Burns, DOB 1929-01-09, MRN [id number]  PCP:  [name], MD   Chief Complaint  Patient presents with  . Other    Follow up from abnormal stress test. Meds reviewed by the pt. verbally. Pt. c/o shortness of breath.     HPI:  Dennis Burns is a pleasant 83 year old gentleman with history of  Chronic shortness of breath  mild aortic valve stenosis/moderate MR/moderate pulmonary hypertension,  PAD, s/p CEA on the right,  Duplex showed significant bilateral common iliac artery stenosis as well as severe left SFA stenosis. Hyperlipidemia,  hypertension,  coronary artery disease, CABG x 4 in 2003,  Chronic renal insufficiency,  atrophic kidney,  living off one kidney, seen by nephrology,Dr. Candiss Norse chronic back pain, sciatica, spinal stenosis, back surgery 2  prostate cancer, radioactive seeds  DVT in right leg noted before 2012 surgery, on  xarelto echocardiogram October 2017 showing mild to moderate aortic valve stenosis, moderate MR, moderately elevated right heart pressures, ejection fraction 55% or greater prior smoking history less than 10 years when he was younger  who presents for follow-up of his coronary disease and PAD chronic shortness of breath,   In follow-up today he presents to discuss his recent stress test results He has inferolateral, lateral wall ischemia Ejection fraction normal on echo with depressed ejection fraction on stress test,   Continued significant shortness of breath Frustrated his breathing has not improved Tolerating beta-blockers, isosorbide  Recent went to [place], unable to keep up with everybody else, limited by several different issues including breathing  Followed by Dr. Candiss Norse Previous creatinine 1.48 in October  Takes Lasix daily    total cholesterol around 140, LDL at goal on Lipitor and zetia  Other past medical history reviewed Seen in the emergency room 07/22/2016 for  chest pain This occurred when he was rushing and had significant shortness of breath Last stress test September 2017, no significant ischemia on review of images by myself Blood pressure elevated in the emergency room Creatinine up to 1.66, BUN 53  continues to have stable but moderate intensity shortness of breath, worse on exertion.  Recent episode of chest discomfort on extreme exertion   chronic back pain, now with numbness down his legs Sees pain management, taking tramadol, oxy BID prn  Other past medical history reviewed Previous noninvasive vascular evaluation which showed normal ABI on the right side and mildly reduced on the left at 0.71.  Duplex showed significant bilateral common iliac artery stenosis as well as severe left SFA stenosis.  Other past medical history reviewed Carotid ultrasound reviewed with him showing 60-79% left carotid disease 40-59% disease on the right  Lower extremity arterial Doppler showing at least moderate common iliac arterial disease, severe disease left mid SFA   back surgery 2012 and 2014    PMH:   has a past medical history of Allergic rhinitis due to pollen, Chronic kidney disease, stage III (moderate) ([place]), Coronary artery disease, DVT, lower extremity, recurrent ([place]), Glaucoma, History of prostate cancer (2004), Hyperlipidemia, Hypertension, Hypothyroidism, Impaired fasting glucose, and Spinal stenosis of lumbar region with radiculopathy.  PSH:    Past Surgical History:  Procedure Laterality Date  . CAROTID ENDARTERECTOMY Right 09/2004   [place] GRAFT  2003   [place]   after fall  . INSERTION PROSTATE RADIATION SEED  2004   and RT  .  POSTERIOR LUMBAR FUSION  2012, 2014    Current Outpatient Medications  Medication Sig Dispense Refill  . Alpha-Lipoic Acid 200 MG CAPS Take 1 capsule by mouth daily.    Marland Kitchen atorvastatin (LIPITOR) 80 MG tablet Take 1 tablet (80 mg  total) by mouth daily. 90 tablet 3  . CALCIUM CITRATE PO Take 1 tablet by mouth daily. Takes 300mg  daily.    . carvedilol (COREG) 6.25 MG tablet Take 1 tablet (6.25 mg total) by mouth 2 (two) times daily. 180 tablet 3  . Cholecalciferol (VITAMIN D) 2000 units CAPS Take 4,000 Units by mouth daily.    . Coenzyme Q10 (CO Q10) 200 MG CAPS Take 1 capsule by mouth daily.    Marland Kitchen CRANBERRY EXTRACT PO Take 1 tablet by mouth daily. Takes 1500mg  daily    . ezetimibe (ZETIA) 10 MG tablet Take 1 tablet (10 mg total) by mouth daily. 90 tablet 4  . furosemide (LASIX) 40 MG tablet Take 40 mg by mouth daily.    . isosorbide mononitrate (IMDUR) 30 MG 24 hr tablet Take 1 tablet (30 mg total) by mouth daily. 90 tablet 3  . latanoprost (XALATAN) 0.005 % ophthalmic solution Place 1 drop into both eyes at bedtime. In morning    . levothyroxine (SYNTHROID, LEVOTHROID) 75 MCG tablet Take 1 tablet (75 mcg total) by mouth daily. 90 tablet 3  . Multiple Vitamin (MULTIVITAMIN) tablet Take 1 tablet by mouth daily.    Marland Kitchen NIFEdipine (PROCARDIA-XL/ADALAT CC) 60 MG 24 hr tablet Take 1 tablet (60 mg total) by mouth 2 (two) times daily. 180 tablet 3  . nitroGLYCERIN (NITROSTAT) 0.4 MG SL tablet Place 1 tablet (0.4 mg total) under the tongue every 5 (five) minutes as needed for chest pain. 25 tablet 3  . Omega 3-6-9 Fatty Acids (OMEGA-3-6-9 PO) Take 1 capsule by mouth 2 (two) times daily.    Marland Kitchen oxyCODONE (OXY IR/ROXICODONE) 5 MG immediate release tablet Take 1 tablet by mouth 2 (two) times daily as needed.    . Rivaroxaban (XARELTO) 15 MG TABS tablet Take 1 tablet (15 mg total) by mouth daily with supper. 90 tablet 3  . timolol (TIMOPTIC) 0.25 % ophthalmic solution Place 1 drop into both eyes 2 (two) times daily. am    . traMADol (ULTRAM) 50 MG tablet Take 2 tablets (100 mg total) by mouth 3 (three) times daily as needed. 180 tablet 2  . valsartan (DIOVAN) 80 MG tablet Take 1 tablet (80 mg total) by mouth 2 (two) times daily. 180 tablet  3   No current facility-administered medications for this visit.      Allergies:   Patient has no known allergies.   Social History:  The patient  reports that he quit smoking about 40 years ago. he has never used smokeless tobacco. He reports that he drinks alcohol. He reports that he does not use drugs.   Family History:   family history includes Cancer in his father and mother; Glaucoma in his mother; Stroke in his maternal grandfather.    Review of Systems: Review of Systems  Constitutional: Positive for malaise/fatigue.  Respiratory: Positive for shortness of breath.   Cardiovascular: Negative.   Gastrointestinal: Negative.   Musculoskeletal: Positive for back pain and joint pain.  Neurological: Negative.        Numbness down his legs bilaterally  Psychiatric/Behavioral: Negative.   All other systems reviewed and are negative.    PHYSICAL EXAM: VS:  BP (!) 124/58 (BP Location: Left Arm, Patient Position:  Sitting, Cuff Size: Normal)   Pulse 61   Ht 5\' 3"  (1.6 m)   Wt 141 lb 8 oz (64.2 kg)   BMI 25.07 kg/m  , BMI Body mass index is 25.07 kg/m.  GEN: Well nourished, well developed, in no acute distress  HEENT: normal  Neck: no JVD, + carotid bruits left >right, no masses Cardiac: RRR; no murmurs, rubs, or gallops,no edema  Respiratory: Relatively clear lung sounds, normal work of breathing GI: soft, nontender, nondistended, + BS MS: no deformity or atrophy  Skin: warm and dry, no rash Neuro:  Strength and sensation are intact Psych: euthymic mood, full affect    Recent Labs: 02/07/2016: ALT 29 05/03/2016: TSH 0.86 07/22/2016: BUN 53; Creatinine, Ser 1.66; Hemoglobin 12.0; Platelets 155; Potassium 4.5; Sodium 140    Lipid Panel Lab Results  Component Value Date   CHOL 136 02/07/2016   HDL 54 02/07/2016   LDLCALC 64 02/07/2016   TRIG 92 02/07/2016      Wt Readings from Last 3 Encounters:  02/05/17 141 lb 8 oz (64.2 kg)  01/23/17 136 lb 4 oz (61.8 kg)   11/22/16 144 lb 12.8 oz (65.7 kg)       ASSESSMENT AND PLAN:  Coronary artery disease involving native coronary artery of native heart without angina pectoris - Plan: EKG 51-VOHY, Basic Metabolic Panel (BMET) Positive stress test inferolateral, lateral, Some inferior Recommended cardiac catheterization I have reviewed the risks, indications, and alternatives to cardiac catheterization, possible angioplasty, and stenting with the patient. Risks include but are not limited to bleeding, infection, vascular injury, stroke, myocardial infection, arrhythmia, kidney injury, radiation-related injury in the case of prolonged fluoroscopy use, emergency cardiac surgery, and death. The patient understands the risks of serious complication is 1-2 in 0737 with diagnostic cardiac cath and 1-2% or less with angioplasty/stenting.  -Procedure will be scheduled for 8:30 on Wednesday, December 5 Recommended he hold Xarelto, Lasix, valsartan until after the procedure Recommended he arrived at 630 for pre-hydration given renal dysfunction Likely also need hydration following the procedure  Pure hypercholesterolemia - Plan: EKG 10-GYIR, Basic Metabolic Panel (BMET) Previously with leg cramping, recommended he decrease Lipitor down to 40 Cholesterol at goal  Essential hypertension - Plan: EKG 48-NIOE, Basic Metabolic Panel (BMET) Blood pressure is well controlled on today's visit. No changes made to the medications.  SOB (shortness of breath) - Plan: EKG 70-JJKK, Basic Metabolic Panel (BMET) Long history, worse recently, positive stress test Scheduled for cardiac catheterization as above  Recurrent deep vein thrombosis (DVT) of right lower extremity (HCC) on anticoagulation , no bleeding Stable .  We will stop Xarelto today in preparation for cardiac catheterization  Spinal stenosis of lumbar region with radiculopathy complaining about lower extremity neuropathy, stable  Chronic kidney disease, stage III  (moderate) Followed by Dr. Johnna Acosta renal function, creatinine 1.48  PAD (peripheral artery disease) (HCC) Stable at least moderate bilateral disease Previously seen by Dr. Fletcher Anon   Total encounter time more than 60 minutes  Greater than 50% was spent in counseling and coordination of care with the patient  Disposition:   F/U  1 months   No orders of the defined types were placed in this encounter.    Signed, Esmond Plants, M.D., Ph.D. 02/05/2017  [place], [place]

## 2017-02-05 ENCOUNTER — Encounter: Payer: Self-pay | Admitting: Cardiovascular Disease

## 2017-02-05 ENCOUNTER — Encounter: Payer: Self-pay | Admitting: *Deleted

## 2017-02-05 ENCOUNTER — Ambulatory Visit (INDEPENDENT_AMBULATORY_CARE_PROVIDER_SITE_OTHER): Payer: Medicare PPO | Admitting: Cardiovascular Disease

## 2017-02-05 ENCOUNTER — Other Ambulatory Visit
Admission: RE | Admit: 2017-02-05 | Discharge: 2017-02-05 | Disposition: A | Discharge status: Home / Self Care | Payer: Medicare PPO | Source: Ambulatory Visit | Attending: Cardiovascular Disease | Admitting: Cardiovascular Disease

## 2017-02-05 VITALS — BP 124/58 | HR 61 | Ht 63.0 in | Wt 141.5 lb

## 2017-02-05 DIAGNOSIS — N183 Chronic kidney disease, stage 3 unspecified: Secondary | ICD-10-CM

## 2017-02-05 DIAGNOSIS — R0600 Dyspnea, unspecified: Secondary | ICD-10-CM

## 2017-02-05 DIAGNOSIS — I10 Essential (primary) hypertension: Secondary | ICD-10-CM | POA: Diagnosis not present

## 2017-02-05 DIAGNOSIS — I739 Peripheral vascular disease, unspecified: Secondary | ICD-10-CM

## 2017-02-05 DIAGNOSIS — R9439 Abnormal result of other cardiovascular function study: Secondary | ICD-10-CM

## 2017-02-05 DIAGNOSIS — I25118 Atherosclerotic heart disease of native coronary artery with other forms of angina pectoris: Secondary | ICD-10-CM | POA: Diagnosis not present

## 2017-02-05 DIAGNOSIS — E7800 Pure hypercholesterolemia, unspecified: Secondary | ICD-10-CM

## 2017-02-05 DIAGNOSIS — R0609 Other forms of dyspnea: Secondary | ICD-10-CM | POA: Diagnosis not present

## 2017-02-05 LAB — BASIC METABOLIC PANEL
ANION GAP: 12 (ref 5–15)
BUN: 68 mg/dL — ABNORMAL HIGH (ref 6–20)
CALCIUM: 9.1 mg/dL (ref 8.9–10.3)
CO2: 24 mmol/L (ref 22–32)
CREATININE: 2.18 mg/dL — AB (ref 0.61–1.24)
Chloride: 100 mmol/L — ABNORMAL LOW (ref 101–111)
GFR calc Af Amer: 30 mL/min — ABNORMAL LOW (ref >60)
GFR calc non Af Amer: 26 mL/min — ABNORMAL LOW (ref >60)
GLUCOSE: 107 mg/dL — AB (ref 65–99)
POTASSIUM: 4.8 mmol/L (ref 3.5–5.1)
Sodium: 136 mmol/L (ref 135–145)

## 2017-02-05 LAB — CBC WITH DIFFERENTIAL/PLATELET
BASOS ABS: 0.1 10*3/uL (ref 0–0.1)
Basophils Relative: 1 %
Eosinophils Absolute: 0.1 10*3/uL (ref 0–0.7)
Eosinophils Relative: 2 %
HEMATOCRIT: 38 % — AB (ref 40.0–52.0)
Hemoglobin: 12.7 g/dL — ABNORMAL LOW (ref 13.0–18.0)
LYMPHS ABS: 1.6 10*3/uL (ref 1.0–3.6)
LYMPHS PCT: 28 %
MCH: 30.7 pg (ref 26.0–34.0)
MCHC: 33.5 g/dL (ref 32.0–36.0)
MCV: 91.6 fL (ref 80.0–100.0)
MONO ABS: 0.7 10*3/uL (ref 0.2–1.0)
Monocytes Relative: 11 %
NEUTROS ABS: 3.4 10*3/uL (ref 1.4–6.5)
Neutrophils Relative %: 58 %
Platelets: 181 10*3/uL (ref 150–440)
RBC: 4.15 MIL/uL — AB (ref 4.40–5.90)
RDW: 15 % — AB (ref 11.5–14.5)
WBC: 5.8 10*3/uL (ref 3.8–10.6)

## 2017-02-05 LAB — PROTIME-INR
INR: 1.08
Prothrombin Time: 13.9 seconds (ref 11.4–15.2)

## 2017-02-05 NOTE — Patient Instructions (Addendum)
Medication Instructions:   No xarelto until after the procedure  No lasix/furosemide until after the procedure  Labwork:  No new labs needed  Testing/Procedures:  No further testing at this time   Follow-Up: It was a pleasure seeing you in the office today. Please call us if you have new issues that need to be addressed before your next appt.  [phone]  Your physician wants you to follow-up in: 1 months.   If you need a refill on your cardiac medications before your next appointment, please call your pharmacy.

## 2017-02-06 ENCOUNTER — Telehealth: Payer: Self-pay | Admitting: *Deleted

## 2017-02-06 ENCOUNTER — Telehealth: Payer: Self-pay

## 2017-02-06 DIAGNOSIS — N183 Chronic kidney disease, stage 3 unspecified: Secondary | ICD-10-CM

## 2017-02-06 DIAGNOSIS — R9439 Abnormal result of other cardiovascular function study: Secondary | ICD-10-CM

## 2017-02-06 NOTE — Telephone Encounter (Signed)
Spoke with patient and reviewed that we needed to cancel procedure scheduled for tomorrow due to abnormal labs. Instructed him to hold furosemide until he has repeat labs done Monday at [facility]. Offered the 12th and he would like different day but the next is the 19th. Will discuss with Dr. [name] and review schedule then call patient back. He verbalized understanding with no further questions at this time.

## 2017-02-06 NOTE — Telephone Encounter (Signed)
Spoke with patient and advised Dr. [name] really does not recommend waiting. He was agreeable to move forward and scheduled right and left heart cath with possible PCI for December 12th with arrival time at 10:15AM and procedure time of 2:30PM. Per Dr. Fletcher Anon patient may have clear liquid breakfast that morning as well. Patient verbalized understanding to go to [facility] for labs on Monday 02/12/17 and to hold furosemide starting today until he has those labs done. He verbalized understanding of all instructions with no further questions at this time.

## 2017-02-06 NOTE — Telephone Encounter (Signed)
-----   Message from Minna Merritts, MD sent at 02/06/2017  1:39 PM EST ----- Regarding: RE: Cath on 12/5 Think we should push to delay 1 week Hold Lasix until recheck BMP Monday, December 10 Schedule for cath next Wednesday, December 12 if there is an opening TG  ----- Message ----- From: Wellington Hampshire, MD Sent: 02/06/2017   9:07 AM To: Valora Corporal, RN, Minna Merritts, MD, # Subject: RE: Cath on 12/5                               His creatinine is worse than before. He should get at least 4 hours of hydration before procedure. He should also understand the risk of contrast induced nephropathy and the 5-10 % chance of needing dialysis.    ----- Message ----- From: Damian Leavell, RN Sent: 02/06/2017   8:08 AM To: Valora Corporal, RN, Wellington Hampshire, MD, # Subject: Cath on 12/5                                   Pt scheduled for first case on 12/5 for R/L cath with grafts with Dr. Fletcher Anon.  Pt Cr 2.18.   Should this Pt get the 4 hour fluid pre cath work up?  Right now he is first case, no time for extra fluids.  Or he could be admitted today for fluids and then be first case.  Let me know how to assist with this case.  Sonia Baller RN

## 2017-02-06 NOTE — Telephone Encounter (Signed)
Left voicemail message to call back  

## 2017-02-06 NOTE — Telephone Encounter (Signed)
-----   Message from Wellington Hampshire, MD sent at 02/06/2017  9:07 AM EST ----- Regarding: RE: Cath on 12/5 His creatinine is worse than before. He should get at least 4 hours of hydration before procedure. He should also understand the risk of contrast induced nephropathy and the 5-10 % chance of needing dialysis.    ----- Message ----- From: Damian Leavell, RN Sent: 02/06/2017   8:08 AM To: Valora Corporal, RN, Wellington Hampshire, MD, # Subject: Cath on 12/5                                   Pt scheduled for first case on 12/5 for R/L cath with grafts with Dr. Fletcher Anon.  Pt Cr 2.18.   Should this Pt get the 4 hour fluid pre cath work up?  Right now he is first case, no time for extra fluids.  Or he could be admitted today for fluids and then be first case.  Let me know how to assist with this case.  Sonia Baller RN

## 2017-02-06 NOTE — Telephone Encounter (Signed)
Per review of Pt labs, Pt Cr 2.18 drawn yesterday 02/05/2017.  Message sent to ordering physician and interventionalist for further direction regarding kidney function.  Pt scheduled as first case.

## 2017-02-06 NOTE — Telephone Encounter (Signed)
Discussion with Dr. [name] and he wants to cancel procedure for tomorrow and would like patient to hold his fluid pill and recheck labs on Monday for possible cath next Wednesday with Dr. Fletcher Anon. Contacted cath lab and canceled procedure and will call patient with this information.

## 2017-02-07 ENCOUNTER — Encounter (HOSPITAL_COMMUNITY): Payer: Self-pay

## 2017-02-07 ENCOUNTER — Ambulatory Visit (HOSPITAL_COMMUNITY): Admit: 2017-02-07 | Payer: Medicare PPO | Admitting: Cardiovascular Disease

## 2017-02-07 SURGERY — RIGHT/LEFT HEART CATH AND CORONARY/GRAFT ANGIOGRAPHY
Anesthesia: LOCAL

## 2017-02-07 NOTE — Telephone Encounter (Signed)
Per cardiologist, cath cancelled and will be rescheduled.

## 2017-02-12 ENCOUNTER — Other Ambulatory Visit
Admission: RE | Admit: 2017-02-12 | Discharge: 2017-02-12 | Disposition: A | Discharge status: Home / Self Care | Payer: Medicare PPO | Source: Ambulatory Visit | Attending: Cardiovascular Disease | Admitting: Cardiovascular Disease

## 2017-02-12 ENCOUNTER — Encounter: Payer: Self-pay | Admitting: Cardiovascular Disease

## 2017-02-12 DIAGNOSIS — N183 Chronic kidney disease, stage 3 unspecified: Secondary | ICD-10-CM

## 2017-02-12 DIAGNOSIS — R9439 Abnormal result of other cardiovascular function study: Secondary | ICD-10-CM

## 2017-02-12 LAB — BASIC METABOLIC PANEL
ANION GAP: 9 (ref 5–15)
BUN: 43 mg/dL — ABNORMAL HIGH (ref 6–20)
CALCIUM: 8.6 mg/dL — AB (ref 8.9–10.3)
CO2: 22 mmol/L (ref 22–32)
Chloride: 107 mmol/L (ref 101–111)
Creatinine, Ser: 1.86 mg/dL — ABNORMAL HIGH (ref 0.61–1.24)
GFR, EST AFRICAN AMERICAN: 36 mL/min — AB (ref >60)
GFR, EST NON AFRICAN AMERICAN: 31 mL/min — AB (ref >60)
Glucose, Bld: 109 mg/dL — ABNORMAL HIGH (ref 65–99)
POTASSIUM: 5.1 mmol/L (ref 3.5–5.1)
Sodium: 138 mmol/L (ref 135–145)

## 2017-02-14 ENCOUNTER — Ambulatory Visit (HOSPITAL_COMMUNITY)
Admission: RE | Admit: 2017-02-14 | Discharge: 2017-02-15 | Disposition: A | Discharge status: Home / Self Care | Payer: Medicare PPO | Source: Ambulatory Visit | Attending: Cardiovascular Disease | Admitting: Cardiovascular Disease

## 2017-02-14 ENCOUNTER — Encounter (HOSPITAL_COMMUNITY)
Admission: RE | Disposition: A | Discharge status: Home / Self Care | Payer: Self-pay | Source: Ambulatory Visit | Attending: Cardiovascular Disease

## 2017-02-14 DIAGNOSIS — Z951 Presence of aortocoronary bypass graft: Secondary | ICD-10-CM | POA: Insufficient documentation

## 2017-02-14 DIAGNOSIS — E7800 Pure hypercholesterolemia, unspecified: Secondary | ICD-10-CM | POA: Insufficient documentation

## 2017-02-14 DIAGNOSIS — N183 Chronic kidney disease, stage 3 (moderate): Secondary | ICD-10-CM | POA: Insufficient documentation

## 2017-02-14 DIAGNOSIS — Z79899 Other long term (current) drug therapy: Secondary | ICD-10-CM | POA: Diagnosis not present

## 2017-02-14 DIAGNOSIS — I739 Peripheral vascular disease, unspecified: Secondary | ICD-10-CM | POA: Diagnosis not present

## 2017-02-14 DIAGNOSIS — E785 Hyperlipidemia, unspecified: Secondary | ICD-10-CM | POA: Insufficient documentation

## 2017-02-14 DIAGNOSIS — M5416 Radiculopathy, lumbar region: Secondary | ICD-10-CM | POA: Diagnosis not present

## 2017-02-14 DIAGNOSIS — Z8546 Personal history of malignant neoplasm of prostate: Secondary | ICD-10-CM | POA: Insufficient documentation

## 2017-02-14 DIAGNOSIS — I208 Other forms of angina pectoris: Secondary | ICD-10-CM | POA: Diagnosis present

## 2017-02-14 DIAGNOSIS — E039 Hypothyroidism, unspecified: Secondary | ICD-10-CM | POA: Diagnosis not present

## 2017-02-14 DIAGNOSIS — I129 Hypertensive chronic kidney disease with stage 1 through stage 4 chronic kidney disease, or unspecified chronic kidney disease: Secondary | ICD-10-CM | POA: Insufficient documentation

## 2017-02-14 DIAGNOSIS — I82501 Chronic embolism and thrombosis of unspecified deep veins of right lower extremity: Secondary | ICD-10-CM | POA: Insufficient documentation

## 2017-02-14 DIAGNOSIS — Z7901 Long term (current) use of anticoagulants: Secondary | ICD-10-CM | POA: Diagnosis not present

## 2017-02-14 DIAGNOSIS — I25708 Atherosclerosis of coronary artery bypass graft(s), unspecified, with other forms of angina pectoris: Secondary | ICD-10-CM

## 2017-02-14 DIAGNOSIS — M48061 Spinal stenosis, lumbar region without neurogenic claudication: Secondary | ICD-10-CM | POA: Insufficient documentation

## 2017-02-14 DIAGNOSIS — I2510 Atherosclerotic heart disease of native coronary artery without angina pectoris: Secondary | ICD-10-CM | POA: Diagnosis not present

## 2017-02-14 DIAGNOSIS — Z87891 Personal history of nicotine dependence: Secondary | ICD-10-CM | POA: Insufficient documentation

## 2017-02-14 DIAGNOSIS — I2584 Coronary atherosclerosis due to calcified coronary lesion: Secondary | ICD-10-CM | POA: Insufficient documentation

## 2017-02-14 DIAGNOSIS — I272 Pulmonary hypertension, unspecified: Secondary | ICD-10-CM | POA: Insufficient documentation

## 2017-02-14 DIAGNOSIS — I2089 Other forms of angina pectoris: Secondary | ICD-10-CM | POA: Diagnosis present

## 2017-02-14 DIAGNOSIS — Z79890 Hormone replacement therapy (postmenopausal): Secondary | ICD-10-CM | POA: Diagnosis not present

## 2017-02-14 HISTORY — PX: RIGHT/LEFT HEART CATH AND CORONARY ANGIOGRAPHY: CATH118266

## 2017-02-14 LAB — POCT I-STAT 3, VENOUS BLOOD GAS (G3P V)
Acid-base deficit: 5 mmol/L — ABNORMAL HIGH (ref 0.0–2.0)
BICARBONATE: 18.6 mmol/L — AB (ref 20.0–28.0)
O2 SAT: 70 %
PCO2 VEN: 30.5 mmHg — AB (ref 44.0–60.0)
PO2 VEN: 36 mmHg (ref 32.0–45.0)
TCO2: 20 mmol/L — AB (ref 22–32)
pH, Ven: 7.394 (ref 7.250–7.430)

## 2017-02-14 LAB — POCT I-STAT 3, ART BLOOD GAS (G3+)
Acid-base deficit: 4 mmol/L — ABNORMAL HIGH (ref 0.0–2.0)
BICARBONATE: 20.3 mmol/L (ref 20.0–28.0)
O2 SAT: 92 %
PO2 ART: 65 mmHg — AB (ref 83.0–108.0)
TCO2: 21 mmol/L — AB (ref 22–32)
pCO2 arterial: 33.9 mmHg (ref 32.0–48.0)
pH, Arterial: 7.386 (ref 7.350–7.450)

## 2017-02-14 SURGERY — RIGHT/LEFT HEART CATH AND CORONARY ANGIOGRAPHY
Anesthesia: LOCAL

## 2017-02-14 MED ORDER — LIDOCAINE HCL (PF) 1 % IJ SOLN
INTRAMUSCULAR | Status: DC | PRN
  Administered 2017-02-14: 1 mL
  Administered 2017-02-14: 2 mL

## 2017-02-14 MED ORDER — ATORVASTATIN CALCIUM 40 MG PO TABS: 40.0000 mg | ORAL_TABLET | Freq: Every day | ORAL | Status: DC

## 2017-02-14 MED ORDER — SODIUM CHLORIDE 0.9% FLUSH: 3.0000 mL | Freq: Two times a day (BID) | INTRAVENOUS | Status: DC

## 2017-02-14 MED ORDER — LEVOTHYROXINE SODIUM 75 MCG PO TABS
75.0000 ug | ORAL_TABLET | Freq: Every day | ORAL | Status: DC
  Filled 2017-02-14: qty 1

## 2017-02-14 MED ORDER — ADULT MULTIVITAMIN W/MINERALS CH: 1.0000 | ORAL_TABLET | Freq: Every day | ORAL | Status: DC

## 2017-02-14 MED ORDER — ASPIRIN EC 81 MG PO TBEC: 81.0000 mg | DELAYED_RELEASE_TABLET | Freq: Every day | ORAL | Status: DC | PRN

## 2017-02-14 MED ORDER — SODIUM CHLORIDE 0.9 % IV SOLN: 250.0000 mL | INTRAVENOUS | Status: DC | PRN

## 2017-02-14 MED ORDER — ISOSORBIDE MONONITRATE ER 30 MG PO TB24: 30.0000 mg | ORAL_TABLET | Freq: Every day | ORAL | Status: DC

## 2017-02-14 MED ORDER — FUROSEMIDE 40 MG PO TABS: 40.0000 mg | ORAL_TABLET | Freq: Every day | ORAL | Status: DC

## 2017-02-14 MED ORDER — SODIUM CHLORIDE 0.9 % WEIGHT BASED INFUSION
3.0000 mL/kg/h | INTRAVENOUS | Status: DC
  Administered 2017-02-14: 3 mL/kg/h via INTRAVENOUS

## 2017-02-14 MED ORDER — CO Q10 200 MG PO CAPS: 1.0000 | ORAL_CAPSULE | Freq: Every day | ORAL | Status: DC

## 2017-02-14 MED ORDER — IOPAMIDOL (ISOVUE-370) INJECTION 76%
INTRAVENOUS | Status: AC
  Filled 2017-02-14: qty 125

## 2017-02-14 MED ORDER — HEPARIN SODIUM (PORCINE) 1000 UNIT/ML IJ SOLN
INTRAMUSCULAR | Status: DC | PRN
  Administered 2017-02-14: 3500 [IU] via INTRAVENOUS

## 2017-02-14 MED ORDER — TIMOLOL MALEATE 0.25 % OP SOLN
1.0000 [drp] | Freq: Two times a day (BID) | OPHTHALMIC | Status: DC
  Filled 2017-02-14: qty 5

## 2017-02-14 MED ORDER — HEPARIN (PORCINE) IN NACL 2-0.9 UNIT/ML-% IJ SOLN
INTRAMUSCULAR | Status: AC
  Filled 2017-02-14: qty 1000

## 2017-02-14 MED ORDER — ACETAMINOPHEN 325 MG PO TABS: 650.0000 mg | ORAL_TABLET | ORAL | Status: DC | PRN

## 2017-02-14 MED ORDER — SODIUM CHLORIDE 0.9 % WEIGHT BASED INFUSION
1.0000 mL/kg/h | INTRAVENOUS | Status: AC
  Administered 2017-02-14: 1 mL/kg/h via INTRAVENOUS

## 2017-02-14 MED ORDER — CARVEDILOL 3.125 MG PO TABS: 6.2500 mg | ORAL_TABLET | Freq: Two times a day (BID) | ORAL | Status: DC

## 2017-02-14 MED ORDER — MIDAZOLAM HCL 2 MG/2ML IJ SOLN
INTRAMUSCULAR | Status: AC
  Filled 2017-02-14: qty 2

## 2017-02-14 MED ORDER — HEPARIN SODIUM (PORCINE) 1000 UNIT/ML IJ SOLN
INTRAMUSCULAR | Status: AC
  Filled 2017-02-14: qty 1

## 2017-02-14 MED ORDER — VITAMIN D 1000 UNITS PO TABS: 4000.0000 [IU] | ORAL_TABLET | Freq: Every day | ORAL | Status: DC

## 2017-02-14 MED ORDER — IRBESARTAN 150 MG PO TABS
150.0000 mg | ORAL_TABLET | Freq: Every day | ORAL | Status: DC
  Filled 2017-02-14 (×2): qty 1

## 2017-02-14 MED ORDER — IOPAMIDOL (ISOVUE-370) INJECTION 76%
INTRAVENOUS | Status: DC | PRN
  Administered 2017-02-14: 115 mL via INTRA_ARTERIAL

## 2017-02-14 MED ORDER — HEPARIN (PORCINE) IN NACL 2-0.9 UNIT/ML-% IJ SOLN
INTRAMUSCULAR | Status: DC | PRN
  Administered 2017-02-14: 10 mL via INTRA_ARTERIAL

## 2017-02-14 MED ORDER — ASPIRIN 81 MG PO CHEW
CHEWABLE_TABLET | ORAL | Status: AC
  Administered 2017-02-14: 81 mg via ORAL
  Filled 2017-02-14: qty 1

## 2017-02-14 MED ORDER — ASPIRIN 81 MG PO CHEW
81.0000 mg | CHEWABLE_TABLET | ORAL | Status: AC
  Administered 2017-02-14: 81 mg via ORAL

## 2017-02-14 MED ORDER — ONDANSETRON HCL 4 MG/2ML IJ SOLN: 4.0000 mg | Freq: Four times a day (QID) | INTRAMUSCULAR | Status: DC | PRN

## 2017-02-14 MED ORDER — VERAPAMIL HCL 2.5 MG/ML IV SOLN
INTRAVENOUS | Status: AC
  Filled 2017-02-14: qty 2

## 2017-02-14 MED ORDER — LIDOCAINE HCL (PF) 1 % IJ SOLN
INTRAMUSCULAR | Status: AC
  Filled 2017-02-14: qty 30

## 2017-02-14 MED ORDER — LATANOPROST 0.005 % OP SOLN
1.0000 [drp] | Freq: Every day | OPHTHALMIC | Status: DC
  Filled 2017-02-14: qty 2.5

## 2017-02-14 MED ORDER — HEPARIN (PORCINE) IN NACL 2-0.9 UNIT/ML-% IJ SOLN
INTRAMUSCULAR | Status: AC | PRN
  Administered 2017-02-14: 1000 mL

## 2017-02-14 MED ORDER — MIDAZOLAM HCL 2 MG/2ML IJ SOLN
INTRAMUSCULAR | Status: DC | PRN
  Administered 2017-02-14: 1 mg via INTRAVENOUS

## 2017-02-14 MED ORDER — NITROGLYCERIN 0.4 MG SL SUBL: 0.4000 mg | SUBLINGUAL_TABLET | SUBLINGUAL | Status: DC | PRN

## 2017-02-14 MED ORDER — SODIUM CHLORIDE 0.9 % WEIGHT BASED INFUSION: 1.0000 mL/kg/h | INTRAVENOUS | Status: DC

## 2017-02-14 MED ORDER — SODIUM CHLORIDE 0.9% FLUSH: 3.0000 mL | INTRAVENOUS | Status: DC | PRN

## 2017-02-14 MED ORDER — NIFEDIPINE ER 60 MG PO TB24
60.0000 mg | ORAL_TABLET | Freq: Two times a day (BID) | ORAL | Status: DC
  Filled 2017-02-14 (×2): qty 1

## 2017-02-14 MED ORDER — EZETIMIBE 10 MG PO TABS: 10.0000 mg | ORAL_TABLET | Freq: Every day | ORAL | Status: DC

## 2017-02-14 MED ORDER — ALPHA-LIPOIC ACID 200 MG PO CAPS: 1.0000 | ORAL_CAPSULE | Freq: Every day | ORAL | Status: DC

## 2017-02-14 SURGICAL SUPPLY — 18 items
CATH BALLN WEDGE 5F 110CM (CATHETERS) ×2 IMPLANT
CATH EXPO 5F MPA-1 (CATHETERS) ×2 IMPLANT
CATH INFINITI 5 FR LCB (CATHETERS) ×2 IMPLANT
CATH INFINITI 5FR AL1 (CATHETERS) ×2 IMPLANT
CATH INFINITI 5FR ANG PIGTAIL (CATHETERS) ×2 IMPLANT
CATH INFINITI JR4 5F (CATHETERS) ×2 IMPLANT
CATH OPTITORQUE TIG 4.0 5F (CATHETERS) ×2 IMPLANT
DEVICE RAD COMP TR BAND LRG (VASCULAR PRODUCTS) ×2 IMPLANT
GLIDESHEATH SLEND SS 6F .021 (SHEATH) ×2 IMPLANT
GUIDEWIRE .025 260CM (WIRE) ×2 IMPLANT
GUIDEWIRE INQWIRE 1.5J.035X260 (WIRE) ×1 IMPLANT
INQWIRE 1.5J .035X260CM (WIRE) ×2
KIT HEART LEFT (KITS) ×2 IMPLANT
PACK CARDIAC CATHETERIZATION (CUSTOM PROCEDURE TRAY) ×2 IMPLANT
SHEATH GLIDE SLENDER 4/5FR (SHEATH) ×2 IMPLANT
SYR MEDRAD MARK V 150ML (SYRINGE) ×2 IMPLANT
TRANSDUCER W/STOPCOCK (MISCELLANEOUS) ×2 IMPLANT
TUBING CIL FLEX 10 FLL-RA (TUBING) ×2 IMPLANT

## 2017-02-14 NOTE — Progress Notes (Signed)
Patient refuses to take medications administered by the hospital.  He is taking medications that he brought from home.  I explained the safety concerns behind our policy, but to no avail.  Spoke with pharmacist, who visited the patient as well.  Cecilie Kicks, NP also informed.  Patient reported to me that he took the following this evening at approximately 1700: Valsartan 80 mg Carvedilol 6.25 mg Atorvastatin 40 mg Zetia 10 mg Eye drops Calcium 300 mg Fish oil, 1,000 mg.

## 2017-02-14 NOTE — Interval H&P Note (Signed)
Cath Lab Visit (complete for each Cath Lab visit)  Clinical Evaluation Leading to the Procedure:   ACS: No.  Non-ACS:    Anginal Classification: CCS III  Anti-ischemic medical therapy: Maximal Therapy (2 or more classes of medications)  Non-Invasive Test Results: High-risk stress test findings: cardiac mortality >3%/year  Prior CABG: Previous CABG      History and Physical Interval Note:  02/14/2017 2:56 PM  Dennis Burns  has presented today for surgery, with the diagnosis of positive stress  The various methods of treatment have been discussed with the patient and family. After consideration of risks, benefits and other options for treatment, the patient has consented to  Procedure(s): RIGHT/LEFT HEART CATH AND CORONARY ANGIOGRAPHY (N/A) as a surgical intervention .  The patient's history has been reviewed, patient examined, no change in status, stable for surgery.  I have reviewed the patient's chart and labs.  Questions were answered to the patient's satisfaction.     [name]

## 2017-02-15 ENCOUNTER — Encounter (HOSPITAL_COMMUNITY): Payer: Self-pay | Admitting: Cardiovascular Disease

## 2017-02-15 ENCOUNTER — Encounter: Payer: Self-pay | Admitting: Cardiovascular Disease

## 2017-02-15 DIAGNOSIS — I208 Other forms of angina pectoris: Secondary | ICD-10-CM | POA: Diagnosis not present

## 2017-02-15 DIAGNOSIS — N183 Chronic kidney disease, stage 3 (moderate): Secondary | ICD-10-CM | POA: Diagnosis not present

## 2017-02-15 DIAGNOSIS — I2584 Coronary atherosclerosis due to calcified coronary lesion: Secondary | ICD-10-CM | POA: Diagnosis not present

## 2017-02-15 DIAGNOSIS — I25118 Atherosclerotic heart disease of native coronary artery with other forms of angina pectoris: Secondary | ICD-10-CM | POA: Diagnosis not present

## 2017-02-15 DIAGNOSIS — I2510 Atherosclerotic heart disease of native coronary artery without angina pectoris: Secondary | ICD-10-CM | POA: Diagnosis not present

## 2017-02-15 DIAGNOSIS — I272 Pulmonary hypertension, unspecified: Secondary | ICD-10-CM | POA: Diagnosis not present

## 2017-02-15 DIAGNOSIS — E785 Hyperlipidemia, unspecified: Secondary | ICD-10-CM | POA: Diagnosis not present

## 2017-02-15 DIAGNOSIS — Z8546 Personal history of malignant neoplasm of prostate: Secondary | ICD-10-CM | POA: Diagnosis not present

## 2017-02-15 DIAGNOSIS — R58 Hemorrhage, not elsewhere classified: Secondary | ICD-10-CM

## 2017-02-15 DIAGNOSIS — E039 Hypothyroidism, unspecified: Secondary | ICD-10-CM | POA: Diagnosis not present

## 2017-02-15 DIAGNOSIS — I129 Hypertensive chronic kidney disease with stage 1 through stage 4 chronic kidney disease, or unspecified chronic kidney disease: Secondary | ICD-10-CM | POA: Diagnosis not present

## 2017-02-15 DIAGNOSIS — Z951 Presence of aortocoronary bypass graft: Secondary | ICD-10-CM | POA: Diagnosis not present

## 2017-02-15 LAB — BASIC METABOLIC PANEL
ANION GAP: 6 (ref 5–15)
BUN: 21 mg/dL — ABNORMAL HIGH (ref 6–20)
CALCIUM: 8.9 mg/dL (ref 8.9–10.3)
CO2: 23 mmol/L (ref 22–32)
CREATININE: 1.25 mg/dL — AB (ref 0.61–1.24)
Chloride: 111 mmol/L (ref 101–111)
GFR, EST AFRICAN AMERICAN: 58 mL/min — AB (ref >60)
GFR, EST NON AFRICAN AMERICAN: 50 mL/min — AB (ref >60)
GLUCOSE: 103 mg/dL — AB (ref 65–99)
Potassium: 4.4 mmol/L (ref 3.5–5.1)
Sodium: 140 mmol/L (ref 135–145)

## 2017-02-15 MED ORDER — ISOSORBIDE MONONITRATE ER 30 MG PO TB24: 60.0000 mg | ORAL_TABLET | Freq: Every day | ORAL | 3 refills | Status: DC

## 2017-02-15 MED ORDER — ISOSORBIDE MONONITRATE ER 60 MG PO TB24: 60.0000 mg | ORAL_TABLET | Freq: Every day | ORAL | Status: DC

## 2017-02-15 NOTE — Progress Notes (Signed)
Progress Note  Patient Name: Dennis Burns Date of Encounter: 02/15/2017  Primary Cardiologist: Dennis Burns  Subjective   Feeling well this morning. Didn't get much sleep last night.   Inpatient Medications    Scheduled Meds: . atorvastatin  40 mg Oral Daily  . carvedilol  6.25 mg Oral BID WC  . cholecalciferol  4,000 Units Oral Daily  . ezetimibe  10 mg Oral Daily  . furosemide  40 mg Oral Daily  . irbesartan  150 mg Oral Daily  . isosorbide mononitrate  30 mg Oral Daily  . latanoprost  1 drop Both Eyes QHS  . levothyroxine  75 mcg Oral QAC breakfast  . multivitamin with minerals  1 tablet Oral Daily  . NIFEdipine  60 mg Oral BID  . sodium chloride flush  3 mL Intravenous Q12H  . timolol  1 drop Both Eyes BID   Continuous Infusions: . sodium chloride     PRN Meds: sodium chloride, acetaminophen, aspirin EC, nitroGLYCERIN, ondansetron (ZOFRAN) IV, sodium chloride flush   Vital Signs    Vitals:   02/14/17 1645 02/14/17 1840 02/14/17 1934 02/15/17 0523  BP: (!) 227/80 (!) 140/55 (!) 155/44 (!) 177/60  Pulse: 77 70 67 71  Resp: 15 (!) 23 (!) 24 16  Temp: 97.6 F (36.4 C)  97.9 F (36.6 C) 97.7 F (36.5 C)  TempSrc: Oral  Oral Oral  SpO2: 93% 94% 94% 93%  Weight:    136 lb 11 oz (62 kg)  Height:        Intake/Output Summary (Last 24 hours) at 02/15/2017 3557 Last data filed at 02/15/2017 3220 Gross per 24 hour  Intake 412.75 ml  Output 600 ml  Net -187.25 ml   Filed Weights   02/14/17 1029 02/15/17 0523  Weight: 140 lb (63.5 kg) 136 lb 11 oz (62 kg)    Telemetry    SR - Personally Reviewed  Physical Exam   General: Well developed, well nourished, male appearing in no acute distress. Head: Normocephalic, atraumatic.  Neck: Supple without bruits, JVD. Lungs:  Resp regular and unlabored, CTA. Heart: RRR, S1, S2, no S3, S4, 3/6 systolic murmur; no rub. Abdomen: Soft, non-tender, non-distended with normoactive bowel sounds. No hepatomegaly. No  rebound/guarding. No obvious abdominal masses. Extremities: No clubbing, cyanosis, edema. Distal pedal pulses are 2+ bilaterally. Left radial cath site stable with bruising.  Neuro: Alert and oriented X 3. Moves all extremities spontaneously. Psych: Normal affect.  Labs    Chemistry Recent Labs  Lab 02/12/17 1402 02/15/17 0339  NA 138 140  K 5.1 4.4  CL 107 111  CO2 22 23  GLUCOSE 109* 103*  BUN 43* 21*  CREATININE 1.86* 1.25*  CALCIUM 8.6* 8.9  GFRNONAA 31* 50*  GFRAA 36* 58*  ANIONGAP 9 6     HematologyNo results for input(s): WBC, RBC, HGB, HCT, MCV, MCH, MCHC, RDW, PLT in the last 168 hours.  Cardiac EnzymesNo results for input(s): TROPONINI in the last 168 hours. No results for input(s): TROPIPOC in the last 168 hours.   BNPNo results for input(s): BNP, PROBNP in the last 168 hours.   DDimer No results for input(s): DDIMER in the last 168 hours.    Radiology    No results found.  Cardiac Studies   Cath: 02/14/17  Conclusion     Ost RCA to Prox RCA lesion is 100% stenosed.  Ost LM lesion is 80% stenosed.  Prox LAD to Mid LAD lesion is 100% stenosed.  Dennis Burns  Cx to Prox Cx lesion is 99% stenosed.  Ost 1st Mrg lesion is 90% stenosed.  LIMA graft was visualized by angiography and is normal in caliber.  SVG.  The graft exhibits mild .   1.  Severe underlying heavily calcified three-vessel coronary artery disease with occluded LAD, ostial right coronary artery and severe ostial left circumflex disease.  Patent LIMA to LAD and SVG to right coronary artery. The SVG to OM is possibly occluded as it could not be engaged and it was not visible and on asending aortogram.  However, this graft might be open given that there is some mild competitive flow in the distal left circumflex. Coronary angiography was difficult due to left subclavian tortuosity and heavy calcifications throughout.  2.  Right heart catheterization showed only mildly elevated filling  pressures, moderate pulmonary hypertension and normal cardiac output. Pulmonary wedge pressure was 16 mmHg, PA pressure was 55 over 13 mmHg and cardiac output was 5.84 with a cardiac index of 3.5.  3.  Left ventricular angiography was not performed due to chronic kidney disease.  Recommendations: The native left circumflex is too high risk for PCI given involvement of left main, ostial location and heavy calcifications.  Might be best to treat medically considering his age and chronic kidney disease.   The patient does have moderate pulmonary hypertension which seems to be out of proportion to his elevated wedge pressure.  Consider treatment of this.   Given the patient's advanced chronic kidney disease, I am going to hydrate him overnight. Angiography via the left radial artery was very difficult due to tortuosity and calcifications.  Anticoagulation can be resumed tomorrow if no bleeding complications but should consider switching Xarelto to low-dose Eliquis given advanced chronic kidney disease.    Patient Profile     83 y.o. male with PMH of CAD s/p CABG, HTN, HL who presented to the office with Dr. Rockey Burns with ongoing dyspnea and was set up for outpatient cath.   Assessment & Plan    1. CAD: Underwent cardiac cath with Dr. Fletcher Burns noted above with 2 patent bypass grafts noted but unable to engage SVG--OM. Also with elevate wedge pressure. Plan is for medical therapy at this time. He was on good medical therapy prior to admission with room to titrate up antianginal therapy. Consider increasing Imdur to 60mg  daily.   2. Pulmonary HTN: Noted to have elevated Wedge pressure on right heart cath. Reports being on lasix prior to cath, but was held. May need to further increase home dose.   3. DVT: Has been on Xarelto, held for cath. Plan to resume today as cath site stable.   4. HTN: Blood pressures have been elevated. Will further increase Imdur to 60mg  today.   5. HL: on Lipitor 40mg   daily  6. CKD: Cr improved to 1.2 post cath.    Signed, Dennis Bellis, NP  02/15/2017, 8:21 AM  Pager # [phone]   For questions or updates, please contact Interior Please consult [email or web address] for contact info under Cardiology/STEMI.   Patient seen and examined. Agree with assessment and plan. No chest pain. Ecchymosis left radial cath site. Agree with increase of Imdur.  To resume xarelto tonight.   Troy Sine, MD, [facility] 02/15/2017 10:23 AM

## 2017-02-15 NOTE — Discharge Summary (Signed)
Discharge Summary    Patient ID: Dennis Burns,  MRN: [id number], DOB/AGE: 1928-12-02 83 y.o.  Admit date: 02/14/2017 Discharge date: 02/15/2017  Primary Care Provider: [name] I Primary Cardiologist: [name]   Discharge Diagnoses    Active Problems:   Effort angina [facility])   Allergies No Known Allergies  Diagnostic Studies/Procedures    Cath: 02/14/17  Conclusion     Ost RCA to Prox RCA lesion is 100% stenosed.  Ost LM lesion is 80% stenosed.  Prox LAD to Mid LAD lesion is 100% stenosed.  Ost Cx to Prox Cx lesion is 99% stenosed.  Ost 1st Mrg lesion is 90% stenosed.  LIMA graft was visualized by angiography and is normal in caliber.  SVG.  The graft exhibits mild .   1.  Severe underlying heavily calcified three-vessel coronary artery disease with occluded LAD, ostial right coronary artery and severe ostial left circumflex disease.  Patent LIMA to LAD and SVG to right coronary artery. The SVG to OM is possibly occluded as it could not be engaged and it was not visible and on asending aortogram.  However, this graft might be open given that there is some mild competitive flow in the distal left circumflex. Coronary angiography was difficult due to left subclavian tortuosity and heavy calcifications throughout.  2.  Right heart catheterization showed only mildly elevated filling pressures, moderate pulmonary hypertension and normal cardiac output. Pulmonary wedge pressure was 16 mmHg, PA pressure was 55 over 13 mmHg and cardiac output was 5.84 with a cardiac index of 3.5.  3.  Left ventricular angiography was not performed due to chronic kidney disease.  Recommendations: The native left circumflex is too high risk for PCI given involvement of left main, ostial location and heavy calcifications.  Might be best to treat medically considering his age and chronic kidney disease.   The patient does have moderate pulmonary hypertension which seems to be  out of proportion to his elevated wedge pressure.  Consider treatment of this.  Given the patient's advanced chronic kidney disease, I am going to hydrate him overnight. Angiography via the left radial artery was very difficult due to tortuosity and calcifications.  Anticoagulation can be resumed tomorrow if no bleeding complications but should consider switching Xarelto to low-dose Eliquis given advanced chronic kidney disease.   _____________   History of Present Illness     83 y.o. male with PMH of CAD s/p CABG, HTN, HL who presented to the office with Dr. [name] with ongoing dyspnea. Noted to have a positive stress test and was set up for outpatient cath.   Hospital Course     1. CAD: Underwent cardiac cath with Dr. Fletcher Anon noted above with 2 patent bypass grafts noted but unable to engage SVG--OM. Also with elevate wedge pressure. Plan is for medical therapy at this time. He was on good medical therapy prior to admission with room to titrate up antianginal therapy. Increased Imdur to 60mg  daily.    2. Pulmonary HTN: Noted to have elevated Wedge pressure on right heart cath. Reports being on lasix prior to cath, but was held. Will plan to resume on home dose, 40mg  daily   3. DVT: Has been on Xarelto, held for cath. Plan to resume tomorrow as he had bruising at his cath site.   4. HTN: Blood pressures were elevated. Will further increase Imdur to 60mg  today.   5. HL: on Lipitor 40mg  daily  6. CKD: Cr improved to 1.2 post cath.  Caliber Landess was seen by Dr. Claiborne Billings and determined stable for discharge home. Follow up in the office has been arranged. Medications are listed below.   _____________  Discharge Vitals Blood pressure (!) 166/65, pulse 76, temperature (!) 97.5 F (36.4 C), temperature source Oral, resp. rate (!) 23, height 5\' 3"  (1.6 m), weight 136 lb 11 oz (62 kg), SpO2 95 %.  Filed Weights   02/14/17 1029 02/15/17 0523  Weight: 140 lb (63.5 kg) 136 lb 11 oz (62  kg)    Labs & Radiologic Studies    CBC No results for input(s): WBC, NEUTROABS, HGB, HCT, MCV, PLT in the last 72 hours. Basic Metabolic Panel Recent Labs    02/12/17 1402 02/15/17 0339  NA 138 140  K 5.1 4.4  CL 107 111  CO2 22 23  GLUCOSE 109* 103*  BUN 43* 21*  CREATININE 1.86* 1.25*  CALCIUM 8.6* 8.9   Liver Function Tests No results for input(s): AST, ALT, ALKPHOS, BILITOT, PROT, ALBUMIN in the last 72 hours. No results for input(s): LIPASE, AMYLASE in the last 72 hours. Cardiac Enzymes No results for input(s): CKTOTAL, CKMB, CKMBINDEX, TROPONINI in the last 72 hours. BNP Invalid input(s): POCBNP D-Dimer No results for input(s): DDIMER in the last 72 hours. Hemoglobin A1C No results for input(s): HGBA1C in the last 72 hours. Fasting Lipid Panel No results for input(s): CHOL, HDL, LDLCALC, TRIG, CHOLHDL, LDLDIRECT in the last 72 hours. Thyroid Function Tests No results for input(s): TSH, T4TOTAL, T3FREE, THYROIDAB in the last 72 hours.  Invalid input(s): FREET3 _____________  Nm Myocar Multi W/spect W/wall Motion / Ef  Result Date: 01/31/2017 Pharmacological myocardial perfusion imaging study with large region of ischemia noted in the lateral wall, mid to apical anterior wall. Fixed defect in the inferolateral wall Inferior and inferolateral wall motion abnormality. EF estimated at 32% EKG changes concerning for ischemia at peak stress anterolateral leads, >2 mm depression Resting EKG with ST and T wave abn in the anterolateral leads High risk scan. Signed, Esmond Plants, MD, Ph.D [facility]   Disposition   Pt is being discharged home today in good condition.  Follow-up Plans & Appointments    Follow-up Information    Minna Merritts, MD Follow up on 03/16/2017.   Specialty:  Cardiology Why:  at 2:40pm for your follow up appt.  Contact information: [place] [address] [phone]          Discharge Instructions     Diet - low sodium heart healthy   Complete by:  As directed    Discharge instructions   Complete by:  As directed    Radial Site Care Refer to this sheet in the next few weeks. These instructions provide you with information on caring for yourself after your procedure. Your caregiver may also give you more specific instructions. Your treatment has been planned according to current medical practices, but problems sometimes occur. Call your caregiver if you have any problems or questions after your procedure. HOME CARE INSTRUCTIONS You may shower the day after the procedure.Remove the bandage (dressing) and gently wash the site with plain soap and water.Gently pat the site dry.  Do not apply powder or lotion to the site.  Do not submerge the affected site in water for 3 to 5 days.  Inspect the site at least twice daily.  Do not flex or bend the affected arm for 24 hours.  No lifting over 5 pounds (2.3 kg) for  5 days after your procedure.  Do not drive home if you are discharged the same day of the procedure. Have someone else drive you.  You may drive 24 hours after the procedure unless otherwise instructed by your caregiver.  What to expect: Any bruising will usually fade within 1 to 2 weeks.  Blood that collects in the tissue (hematoma) may be painful to the touch. It should usually decrease in size and tenderness within 1 to 2 weeks.  SEEK IMMEDIATE MEDICAL CARE IF: You have unusual pain at the radial site.  You have redness, warmth, swelling, or pain at the radial site.  You have drainage (other than a small amount of blood on the dressing).  You have chills.  You have a fever or persistent symptoms for more than 72 hours.  You have a fever and your symptoms suddenly get worse.  Your arm becomes pale, cool, tingly, or numb.  You have heavy bleeding from the site. Hold pressure on the site.   Increase activity slowly   Complete by:  As directed       Discharge Medications       Medication List    STOP taking these medications   aspirin EC 81 MG tablet     TAKE these medications   Alpha-Lipoic Acid 200 MG Caps Take 1 capsule by mouth daily.   atorvastatin 80 MG tablet Commonly known as:  LIPITOR Take 1 tablet (80 mg total) by mouth daily. What changed:  how much to take   CALCIUM CITRATE PO Take 1 tablet by mouth daily. Takes 300mg  daily.   carvedilol 6.25 MG tablet Commonly known as:  COREG Take 1 tablet (6.25 mg total) by mouth 2 (two) times daily.   Co Q10 200 MG Caps Take 1 capsule by mouth daily.   CRANBERRY EXTRACT PO Take 1 tablet by mouth daily. Takes 1500mg  daily   ezetimibe 10 MG tablet Commonly known as:  ZETIA Take 1 tablet (10 mg total) by mouth daily.   furosemide 40 MG tablet Commonly known as:  LASIX Take 40 mg by mouth daily.   isosorbide mononitrate 30 MG 24 hr tablet Commonly known as:  IMDUR Take 2 tablets (60 mg total) by mouth daily. What changed:  how much to take   latanoprost 0.005 % ophthalmic solution Commonly known as:  XALATAN Place 1 drop into both eyes at bedtime. In morning   levothyroxine 75 MCG tablet Commonly known as:  SYNTHROID, LEVOTHROID Take 1 tablet (75 mcg total) by mouth daily.   multivitamin tablet Take 1 tablet by mouth daily.   NIFEdipine 60 MG 24 hr tablet Commonly known as:  PROCARDIA-XL/ADALAT CC Take 1 tablet (60 mg total) by mouth 2 (two) times daily.   nitroGLYCERIN 0.4 MG SL tablet Commonly known as:  NITROSTAT Place 1 tablet (0.4 mg total) under the tongue every 5 (five) minutes as needed for chest pain.   OMEGA-3-6-9 PO Take 1 capsule by mouth 2 (two) times daily.   Rivaroxaban 15 MG Tabs tablet Commonly known as:  XARELTO Take 1 tablet (15 mg total) by mouth daily with supper.   timolol 0.25 % ophthalmic solution Commonly known as:  TIMOPTIC Place 1 drop into both eyes 2 (two) times daily. am   traMADol 50 MG tablet Commonly known as:  ULTRAM Take 2 tablets (100  mg total) by mouth 3 (three) times daily as needed.   valsartan 80 MG tablet Commonly known as:  DIOVAN Take 1 tablet (80  mg total) by mouth 2 (two) times daily.   Vitamin D 2000 units Caps Take 4,000 Units by mouth daily.         Outstanding Labs/Studies   N/a   Duration of Discharge Encounter   Greater than 30 minutes including physician time.  Signed, Reino Bellis NP-C 02/15/2017, 11:54 AM

## 2017-02-16 ENCOUNTER — Other Ambulatory Visit: Payer: Self-pay | Admitting: *Deleted

## 2017-02-16 MED ORDER — EZETIMIBE 10 MG PO TABS: 10.0000 mg | ORAL_TABLET | Freq: Every day | ORAL | 4 refills | Status: DC

## 2017-02-16 MED ORDER — ATORVASTATIN CALCIUM 80 MG PO TABS: 80.0000 mg | ORAL_TABLET | Freq: Every day | ORAL | 3 refills | Status: DC

## 2017-02-16 MED ORDER — VALSARTAN 80 MG PO TABS
80.0000 mg | ORAL_TABLET | Freq: Two times a day (BID) | ORAL | 3 refills | Status: DC
Start: 2017-02-16 — End: 2017-05-10

## 2017-02-16 NOTE — Telephone Encounter (Signed)
Requested Prescriptions   Signed Prescriptions Disp Refills  . atorvastatin (LIPITOR) 80 MG tablet 90 tablet 3    Sig: Take 1 tablet (80 mg total) by mouth daily.    Authorizing Provider: Minna Merritts    Ordering User: Othelia Pulling C  . ezetimibe (ZETIA) 10 MG tablet 90 tablet 4    Sig: Take 1 tablet (10 mg total) by mouth daily.    Authorizing Provider: Minna Merritts    Ordering User: Eugenio Hoes, MARINA C  . valsartan (DIOVAN) 80 MG tablet 180 tablet 3    Sig: Take 1 tablet (80 mg total) by mouth 2 (two) times daily.    Authorizing Provider: Minna Merritts    Ordering User: Britt Bottom

## 2017-02-26 ENCOUNTER — Encounter: Payer: Self-pay | Admitting: Internal Medicine

## 2017-02-28 NOTE — Telephone Encounter (Signed)
Approved:  #[id number] x 0 

## 2017-02-28 NOTE — Telephone Encounter (Signed)
Last faxed to [facility] 10-05-16 #180 with 2 refills since they will only do 1 month at a time.  Last CPE 05-03-16 Acute OV 11-22-16 No Future OV  Will have to sign rx and fax to [facility].

## 2017-03-01 DIAGNOSIS — H401133 Primary open-angle glaucoma, bilateral, severe stage: Secondary | ICD-10-CM | POA: Diagnosis not present

## 2017-03-01 DIAGNOSIS — H2513 Age-related nuclear cataract, bilateral: Secondary | ICD-10-CM | POA: Diagnosis not present

## 2017-03-01 MED ORDER — TRAMADOL HCL 50 MG PO TABS: 100.0000 mg | ORAL_TABLET | Freq: Three times a day (TID) | ORAL | 2 refills | Status: DC | PRN

## 2017-03-01 NOTE — Telephone Encounter (Signed)
I have faxed the prescription to [facility]

## 2017-03-01 NOTE — Telephone Encounter (Signed)
Rx printed and waiting on signature so it can be faxed

## 2017-03-15 NOTE — Progress Notes (Signed)
Cardiology Office Note  Date:  03/16/2017   ID:  Dennis Burns, DOB 10-Dec-1928, MRN [id number]  PCP:  [name], MD   Chief Complaint  Patient presents with  . Other    1 month follow up. Patient c/o SOB but states he has had a stuffy nose. Meds reviewed verbally with patient.     HPI:  Dennis Burns is a pleasant 84 year old gentleman with history of  Chronic shortness of breath  mild aortic valve stenosis/moderate MR/moderate pulmonary hypertension,  PAD, s/p CEA on the right,  Duplex showed significant bilateral common iliac artery stenosis as well as severe left SFA stenosis. Hyperlipidemia,  hypertension,  coronary artery disease, CABG x 4 in 2003,  Chronic renal insufficiency,  atrophic kidney,  living off one kidney, seen by nephrology,Dr. Candiss Norse chronic back pain, sciatica, spinal stenosis, back surgery 2  prostate cancer, radioactive seeds  DVT in right leg noted before 2012 surgery, on  xarelto echocardiogram October 2017 showing mild to moderate aortic valve stenosis, moderate MR, moderately elevated right heart pressures, ejection fraction 55% or greater prior smoking history less than 10 years when he was younger  who presents for follow-up of his coronary disease and PAD chronic shortness of breath,   SOB work up stress test results He has inferolateral, lateral wall ischemia Ejection fraction normal on echo with depressed ejection fraction on stress test,   Cardiac cath 02/14/217 Severe underlying heavily calcified three-vessel coronary artery disease occluded LAD,  ostial right coronary artery and severe ostial left circumflex disease.   Patent LIMA to LAD and SVG to right coronary artery. The SVG to OM is possibly occluded, not visible on asending aortogram.  graft might be open given that there is some mild competitive flow in the distal left circumflex. -- left subclavian tortuosity and heavy calcifications throughout.  2.  Right heart  catheterization showed only mildly elevated filling pressures, moderate pulmonary hypertension and normal cardiac output. Pulmonary wedge pressure was 16 mmHg, PA pressure was 55 over 13 mmHg and cardiac output was 5.84 with a cardiac index of 3.5.  3.  Left ventricular angiography was not performed due to chronic kidney disease.  --The native left circumflex is too high risk for PCI given involvement of left main, ostial location and heavy calcifications.  treat medically  --moderate pulmonary hypertension   Followed by Dr. Candiss Norse Previous creatinine 1.48 in October  Takes Lasix daily , sometimes twice a day After fluid hydration following cardiac catheterization creatinine down to 1.2, BUN 21 GFR up to 50  He feels his shortness of breath is somewhat improved on isosorbide twice daily  LDL reviewed with him less than 70 No regular exercise program  EKG personally reviewed by myself on todays visit Shows normal sinus rhythm rate 69 bpm nonspecific ST and T wave abnormality V3 through V6 1 and aVL  Other past medical history reviewed Seen in the emergency room 07/22/2016 for chest pain This occurred when he was rushing and had significant shortness of breath Last stress test September 2017, no significant ischemia on review of images by myself Blood pressure elevated in the emergency room Creatinine up to 1.66, BUN 53  continues to have stable but moderate intensity shortness of breath, worse on exertion.  Recent episode of chest discomfort on extreme exertion   chronic back pain, now with numbness down his legs Sees pain management, taking tramadol, oxy BID prn  Other past medical history reviewed Previous noninvasive vascular evaluation which showed normal ABI  on the right side and mildly reduced on the left at 0.71.  Duplex showed significant bilateral common iliac artery stenosis as well as severe left SFA stenosis.  Carotid ultrasound reviewed with him showing 60-79% left  carotid disease 40-59% disease on the right  Lower extremity arterial Doppler showing at least moderate common iliac arterial disease, severe disease left mid SFA   back surgery 2012 and 2014  PMH:   has a past medical history of Allergic rhinitis due to pollen, Chronic kidney disease, stage III (moderate) ([place]), Coronary artery disease, DVT, lower extremity, recurrent ([place]), Glaucoma, History of prostate cancer (2004), Hyperlipidemia, Hypertension, Hypothyroidism, Impaired fasting glucose, and Spinal stenosis of lumbar region with radiculopathy.  PSH:    Past Surgical History:  Procedure Laterality Date  . CAROTID ENDARTERECTOMY Right 09/2004   [place] GRAFT  2003   [place]   after fall  . INSERTION PROSTATE RADIATION SEED  2004   and RT  . POSTERIOR LUMBAR FUSION  2012, 2014  . RIGHT/LEFT HEART CATH AND CORONARY ANGIOGRAPHY N/A 02/14/2017   Procedure: RIGHT/LEFT HEART CATH AND CORONARY ANGIOGRAPHY;  Surgeon: Wellington Hampshire, MD;  Location: [facility];  Service: Cardiovascular;  Laterality: N/A;    Current Outpatient Medications  Medication Sig Dispense Refill  . Alpha-Lipoic Acid 200 MG CAPS Take 1 capsule by mouth daily.    Marland Kitchen atorvastatin (LIPITOR) 80 MG tablet Take 1 tablet (80 mg total) by mouth daily. 90 tablet 3  . CALCIUM CITRATE PO Take 1 tablet by mouth daily. Takes 300mg  daily.    . carvedilol (COREG) 6.25 MG tablet Take 1 tablet (6.25 mg total) by mouth 2 (two) times daily. 180 tablet 3  . Cholecalciferol (VITAMIN D) 2000 units CAPS Take 4,000 Units by mouth daily.    . Coenzyme Q10 (CO Q10) 200 MG CAPS Take 1 capsule by mouth daily.    Marland Kitchen CRANBERRY EXTRACT PO Take 1 tablet by mouth daily. Takes 1500mg  daily    . ezetimibe (ZETIA) 10 MG tablet Take 1 tablet (10 mg total) by mouth daily. 90 tablet 4  . furosemide (LASIX) 40 MG tablet Take 40 mg by mouth daily.    . isosorbide mononitrate (IMDUR)  30 MG 24 hr tablet Take 2 tablets (60 mg total) by mouth daily. 90 tablet 3  . latanoprost (XALATAN) 0.005 % ophthalmic solution Place 1 drop into both eyes at bedtime. In morning    . levothyroxine (SYNTHROID, LEVOTHROID) 75 MCG tablet Take 1 tablet (75 mcg total) by mouth daily. 90 tablet 3  . Multiple Vitamin (MULTIVITAMIN) tablet Take 1 tablet by mouth daily.    Marland Kitchen NIFEdipine (PROCARDIA-XL/ADALAT CC) 60 MG 24 hr tablet Take 1 tablet (60 mg total) by mouth 2 (two) times daily. 180 tablet 3  . Omega 3-6-9 Fatty Acids (OMEGA-3-6-9 PO) Take 1 capsule by mouth 2 (two) times daily.    . Rivaroxaban (XARELTO) 15 MG TABS tablet Take 1 tablet (15 mg total) by mouth daily with supper. 90 tablet 3  . timolol (TIMOPTIC) 0.25 % ophthalmic solution Place 1 drop into both eyes 2 (two) times daily. am    . traMADol (ULTRAM) 50 MG tablet Take 2 tablets (100 mg total) by mouth 3 (three) times daily as needed. 180 tablet 2  . valsartan (DIOVAN) 80 MG tablet Take 1 tablet (80 mg total) by mouth 2 (two) times daily. 180 tablet 3  .  nitroGLYCERIN (NITROSTAT) 0.4 MG SL tablet Place 1 tablet (0.4 mg total) under the tongue every 5 (five) minutes as needed for chest pain. 25 tablet 3   No current facility-administered medications for this visit.      Allergies:   Patient has no known allergies.   Social History:  The patient  reports that he quit smoking about 41 years ago. he has never used smokeless tobacco. He reports that he drinks alcohol. He reports that he does not use drugs.   Family History:   family history includes Cancer in his father and mother; Glaucoma in his mother; Stroke in his maternal grandfather.    Review of Systems: Review of Systems  Constitutional: Positive for malaise/fatigue.  Respiratory: Positive for shortness of breath.   Cardiovascular: Negative.   Gastrointestinal: Negative.   Musculoskeletal: Positive for back pain and joint pain.  Neurological: Negative.        Numbness  down his legs bilaterally  Psychiatric/Behavioral: Negative.   All other systems reviewed and are negative.    PHYSICAL EXAM: VS:  BP (!) 112/58 (BP Location: Left Arm, Patient Position: Sitting, Cuff Size: Normal)   Pulse 69   Ht 5\' 3"  (1.6 m)   Wt 137 lb (62.1 kg)   BMI 24.27 kg/m  , BMI Body mass index is 24.27 kg/m. GEN: Well nourished, well developed, in no acute distress  HEENT: normal  Neck: no JVD, + carotid bruits left >right, no masses Cardiac: RRR; no murmurs, rubs, or gallops,no edema  Respiratory: Relatively clear lung sounds, normal work of breathing GI: soft, nontender, nondistended, + BS MS: no deformity or atrophy  Skin: warm and dry, no rash Neuro:  Strength and sensation are intact Psych: euthymic mood, full affect    Recent Labs: 05/03/2016: TSH 0.86 02/05/2017: Hemoglobin 12.7; Platelets 181 02/15/2017: BUN 21; Creatinine, Ser 1.25; Potassium 4.4; Sodium 140    Lipid Panel Lab Results  Component Value Date   CHOL 136 02/07/2016   HDL 54 02/07/2016   LDLCALC 64 02/07/2016   TRIG 92 02/07/2016      Wt Readings from Last 3 Encounters:  03/16/17 137 lb (62.1 kg)  02/15/17 136 lb 11 oz (62 kg)  02/05/17 141 lb 8 oz (64.2 kg)       ASSESSMENT AND PLAN:  Coronary artery disease involving native coronary artery of native heart without angina pectoris  Recent cardiac catheterization results discussed with him in detail Medical management at this time including ostial left circumflex lesion  Pulmonary hypertension Moderately elevated right heart pressures on cardiac catheterization Discussed starting revatio 20 mg 3 times daily for high pressures or starting other workup. he prefers to keep his medications as they are.  As shortness of breath symptoms have improved.  If he does elect to start generic Viagra we would need to wean him off his isosorbide and he is not willing to do this  Pure hypercholesterolemia - Plan: EKG 22-GURK, Basic Metabolic  Panel (BMET) Previous leg cramping, tolerating Lipitor 40 and Zetia LDL at goal the we have indicated we would like to get this lower Not a candidate for PCSK9 inhibitor given LDL less than 70  Essential hypertension - Plan: EKG 27-CWCB, Basic Metabolic Panel (BMET) Blood pressure is well controlled on today's visit. No changes made to the medications.  SOB (shortness of breath) - Plan: EKG 76-EGBT, Basic Metabolic Panel (BMET) Long history, worse recently, positive stress test Scheduled for cardiac catheterization as above  Recurrent deep vein thrombosis (  DVT) of right lower extremity (HCC) on anticoagulation , no bleeding Long discussion concerning changing him to Eliquis 2.5 twice daily given his renal dysfunction  He prefers to stay on Xarelto 15 mg daily  Spinal stenosis of lumbar region with radiculopathy complaining about lower extremity neuropathy, stable  Chronic kidney disease, stage III (moderate) Followed by Dr. Candiss Norse Interesting that with fluid hydration overnight holding Lasix creatinine down to 1.2 BUN 21 He insists on staying on his Lasix He did not notice any significant shortness of breath after fluid hydration following the cardiac catheterization and for the next several days  PAD (peripheral artery disease) ([place]) Stable at least moderate bilateral disease Previously seen by Dr. Fletcher Anon   Total encounter time more than 45 minutes  Greater than 50% was spent in counseling and coordination of care with the patient  Disposition:   F/U  6 months   No orders of the defined types were placed in this encounter.    Signed, Esmond Plants, M.D., Ph.D. 03/16/2017  [place], [place]

## 2017-03-16 ENCOUNTER — Ambulatory Visit: Payer: Medicare PPO | Admitting: Cardiovascular Disease

## 2017-03-16 ENCOUNTER — Encounter: Payer: Self-pay | Admitting: Cardiovascular Disease

## 2017-03-16 VITALS — BP 112/58 | HR 69 | Ht 63.0 in | Wt 137.0 lb

## 2017-03-16 DIAGNOSIS — I25118 Atherosclerotic heart disease of native coronary artery with other forms of angina pectoris: Secondary | ICD-10-CM | POA: Diagnosis not present

## 2017-03-16 DIAGNOSIS — N183 Chronic kidney disease, stage 3 unspecified: Secondary | ICD-10-CM

## 2017-03-16 DIAGNOSIS — I272 Pulmonary hypertension, unspecified: Secondary | ICD-10-CM

## 2017-03-16 DIAGNOSIS — I208 Other forms of angina pectoris: Secondary | ICD-10-CM | POA: Diagnosis not present

## 2017-03-16 DIAGNOSIS — I739 Peripheral vascular disease, unspecified: Secondary | ICD-10-CM

## 2017-03-16 NOTE — Patient Instructions (Addendum)
Medication Instructions:   No medication changes made  You can add extra coreg 1/2 up to whole pill in the AM for speed control  Call if you would like to experiment with revatio 20 mg three times a day Lung vasodilation  Ok to take extra imdur in the afternoon or evening if blood pressure tolerates  Think about eliquis 2.5 vs xarelto 15 mg Talk with Dr. Renee Harder:  No new labs needed  Testing/Procedures:  No further testing at this time   Follow-Up: It was a pleasure seeing you in the office today. Please call us if you have new issues that need to be addressed before your next appt.  [phone]  Your physician wants you to follow-up in: 12 months.  You will receive a reminder letter in the mail two months in advance. If you don't receive a letter, please call our office to schedule the follow-up appointment.  If you need a refill on your cardiac medications before your next appointment, please call your pharmacy.

## 2017-03-20 ENCOUNTER — Encounter: Payer: Self-pay | Admitting: Cardiovascular Disease

## 2017-03-20 ENCOUNTER — Other Ambulatory Visit: Payer: Self-pay | Admitting: *Deleted

## 2017-03-20 MED ORDER — NIFEDIPINE ER 60 MG PO TB24: 60.0000 mg | ORAL_TABLET | Freq: Two times a day (BID) | ORAL | 3 refills | Status: DC

## 2017-03-20 MED ORDER — CARVEDILOL 6.25 MG PO TABS: 6.2500 mg | ORAL_TABLET | Freq: Two times a day (BID) | ORAL | 3 refills | Status: DC

## 2017-03-23 NOTE — Addendum Note (Signed)
Addended by: Anselm Pancoast on: 03/23/2017 10:09 AM   Modules accepted: Orders

## 2017-04-03 ENCOUNTER — Other Ambulatory Visit: Payer: Self-pay | Admitting: *Deleted

## 2017-04-03 DIAGNOSIS — I6529 Occlusion and stenosis of unspecified carotid artery: Secondary | ICD-10-CM

## 2017-04-30 ENCOUNTER — Emergency Department: Payer: Medicare PPO

## 2017-04-30 ENCOUNTER — Encounter: Payer: Self-pay | Admitting: Physician Assistant

## 2017-04-30 ENCOUNTER — Inpatient Hospital Stay
Admission: EM | Admit: 2017-04-30 | Discharge: 2017-05-10 | DRG: 280 | Disposition: A | Discharge status: Home / Self Care | Payer: Medicare PPO | Attending: Internal Medicine | Admitting: Internal Medicine

## 2017-04-30 ENCOUNTER — Other Ambulatory Visit: Payer: Self-pay

## 2017-04-30 ENCOUNTER — Telehealth: Payer: Self-pay | Admitting: Cardiovascular Disease

## 2017-04-30 ENCOUNTER — Encounter: Payer: Self-pay | Admitting: Emergency Medicine

## 2017-04-30 ENCOUNTER — Ambulatory Visit: Payer: Medicare PPO | Admitting: Physician Assistant

## 2017-04-30 VITALS — BP 94/42 | HR 73 | Ht 63.0 in | Wt 147.8 lb

## 2017-04-30 DIAGNOSIS — I509 Heart failure, unspecified: Secondary | ICD-10-CM

## 2017-04-30 DIAGNOSIS — N179 Acute kidney failure, unspecified: Secondary | ICD-10-CM

## 2017-04-30 DIAGNOSIS — I361 Nonrheumatic tricuspid (valve) insufficiency: Secondary | ICD-10-CM | POA: Diagnosis not present

## 2017-04-30 DIAGNOSIS — Z951 Presence of aortocoronary bypass graft: Secondary | ICD-10-CM

## 2017-04-30 DIAGNOSIS — Z86718 Personal history of other venous thrombosis and embolism: Secondary | ICD-10-CM

## 2017-04-30 DIAGNOSIS — N184 Chronic kidney disease, stage 4 (severe): Secondary | ICD-10-CM | POA: Diagnosis not present

## 2017-04-30 DIAGNOSIS — Z7901 Long term (current) use of anticoagulants: Secondary | ICD-10-CM

## 2017-04-30 DIAGNOSIS — N189 Chronic kidney disease, unspecified: Secondary | ICD-10-CM | POA: Diagnosis not present

## 2017-04-30 DIAGNOSIS — I130 Hypertensive heart and chronic kidney disease with heart failure and stage 1 through stage 4 chronic kidney disease, or unspecified chronic kidney disease: Secondary | ICD-10-CM | POA: Diagnosis present

## 2017-04-30 DIAGNOSIS — I272 Pulmonary hypertension, unspecified: Secondary | ICD-10-CM | POA: Diagnosis not present

## 2017-04-30 DIAGNOSIS — I5042 Chronic combined systolic (congestive) and diastolic (congestive) heart failure: Secondary | ICD-10-CM

## 2017-04-30 DIAGNOSIS — I472 Ventricular tachycardia: Secondary | ICD-10-CM | POA: Diagnosis present

## 2017-04-30 DIAGNOSIS — E785 Hyperlipidemia, unspecified: Secondary | ICD-10-CM | POA: Diagnosis present

## 2017-04-30 DIAGNOSIS — N183 Chronic kidney disease, stage 3 unspecified: Secondary | ICD-10-CM

## 2017-04-30 DIAGNOSIS — Z7189 Other specified counseling: Secondary | ICD-10-CM | POA: Diagnosis not present

## 2017-04-30 DIAGNOSIS — I080 Rheumatic disorders of both mitral and aortic valves: Secondary | ICD-10-CM | POA: Diagnosis present

## 2017-04-30 DIAGNOSIS — I5043 Acute on chronic combined systolic (congestive) and diastolic (congestive) heart failure: Secondary | ICD-10-CM | POA: Diagnosis not present

## 2017-04-30 DIAGNOSIS — J189 Pneumonia, unspecified organism: Secondary | ICD-10-CM | POA: Diagnosis present

## 2017-04-30 DIAGNOSIS — I5033 Acute on chronic diastolic (congestive) heart failure: Secondary | ICD-10-CM | POA: Diagnosis not present

## 2017-04-30 DIAGNOSIS — I25118 Atherosclerotic heart disease of native coronary artery with other forms of angina pectoris: Secondary | ICD-10-CM

## 2017-04-30 DIAGNOSIS — I471 Supraventricular tachycardia: Secondary | ICD-10-CM | POA: Diagnosis not present

## 2017-04-30 DIAGNOSIS — Z87891 Personal history of nicotine dependence: Secondary | ICD-10-CM | POA: Diagnosis not present

## 2017-04-30 DIAGNOSIS — I701 Atherosclerosis of renal artery: Secondary | ICD-10-CM | POA: Diagnosis present

## 2017-04-30 DIAGNOSIS — Z515 Encounter for palliative care: Secondary | ICD-10-CM | POA: Diagnosis not present

## 2017-04-30 DIAGNOSIS — Z79890 Hormone replacement therapy (postmenopausal): Secondary | ICD-10-CM | POA: Diagnosis not present

## 2017-04-30 DIAGNOSIS — J9601 Acute respiratory failure with hypoxia: Secondary | ICD-10-CM | POA: Diagnosis not present

## 2017-04-30 DIAGNOSIS — N170 Acute kidney failure with tubular necrosis: Secondary | ICD-10-CM | POA: Diagnosis not present

## 2017-04-30 DIAGNOSIS — I2510 Atherosclerotic heart disease of native coronary artery without angina pectoris: Secondary | ICD-10-CM | POA: Diagnosis not present

## 2017-04-30 DIAGNOSIS — K648 Other hemorrhoids: Secondary | ICD-10-CM | POA: Diagnosis present

## 2017-04-30 DIAGNOSIS — I350 Nonrheumatic aortic (valve) stenosis: Secondary | ICD-10-CM | POA: Diagnosis present

## 2017-04-30 DIAGNOSIS — Z79899 Other long term (current) drug therapy: Secondary | ICD-10-CM

## 2017-04-30 DIAGNOSIS — I10 Essential (primary) hypertension: Secondary | ICD-10-CM | POA: Diagnosis not present

## 2017-04-30 DIAGNOSIS — I214 Non-ST elevation (NSTEMI) myocardial infarction: Principal | ICD-10-CM

## 2017-04-30 DIAGNOSIS — I5021 Acute systolic (congestive) heart failure: Secondary | ICD-10-CM | POA: Diagnosis not present

## 2017-04-30 DIAGNOSIS — I493 Ventricular premature depolarization: Secondary | ICD-10-CM | POA: Diagnosis present

## 2017-04-30 DIAGNOSIS — D631 Anemia in chronic kidney disease: Secondary | ICD-10-CM | POA: Diagnosis present

## 2017-04-30 DIAGNOSIS — I959 Hypotension, unspecified: Secondary | ICD-10-CM | POA: Diagnosis not present

## 2017-04-30 DIAGNOSIS — H409 Unspecified glaucoma: Secondary | ICD-10-CM | POA: Diagnosis present

## 2017-04-30 DIAGNOSIS — I129 Hypertensive chronic kidney disease with stage 1 through stage 4 chronic kidney disease, or unspecified chronic kidney disease: Secondary | ICD-10-CM | POA: Diagnosis not present

## 2017-04-30 DIAGNOSIS — E039 Hypothyroidism, unspecified: Secondary | ICD-10-CM | POA: Diagnosis not present

## 2017-04-30 DIAGNOSIS — R079 Chest pain, unspecified: Secondary | ICD-10-CM | POA: Diagnosis not present

## 2017-04-30 DIAGNOSIS — I255 Ischemic cardiomyopathy: Secondary | ICD-10-CM | POA: Diagnosis present

## 2017-04-30 DIAGNOSIS — R262 Difficulty in walking, not elsewhere classified: Secondary | ICD-10-CM | POA: Diagnosis not present

## 2017-04-30 DIAGNOSIS — Z8546 Personal history of malignant neoplasm of prostate: Secondary | ICD-10-CM | POA: Diagnosis not present

## 2017-04-30 DIAGNOSIS — M6281 Muscle weakness (generalized): Secondary | ICD-10-CM | POA: Diagnosis not present

## 2017-04-30 DIAGNOSIS — J9690 Respiratory failure, unspecified, unspecified whether with hypoxia or hypercapnia: Secondary | ICD-10-CM

## 2017-04-30 DIAGNOSIS — M7989 Other specified soft tissue disorders: Secondary | ICD-10-CM | POA: Diagnosis not present

## 2017-04-30 DIAGNOSIS — R0602 Shortness of breath: Secondary | ICD-10-CM

## 2017-04-30 DIAGNOSIS — J9600 Acute respiratory failure, unspecified whether with hypoxia or hypercapnia: Secondary | ICD-10-CM | POA: Diagnosis not present

## 2017-04-30 DIAGNOSIS — J810 Acute pulmonary edema: Secondary | ICD-10-CM | POA: Diagnosis not present

## 2017-04-30 DIAGNOSIS — Z66 Do not resuscitate: Secondary | ICD-10-CM | POA: Diagnosis present

## 2017-04-30 LAB — BASIC METABOLIC PANEL
ANION GAP: 15 (ref 5–15)
BUN: 94 mg/dL — ABNORMAL HIGH (ref 6–20)
CALCIUM: 8.5 mg/dL — AB (ref 8.9–10.3)
CO2: 17 mmol/L — ABNORMAL LOW (ref 22–32)
CREATININE: 3.73 mg/dL — AB (ref 0.61–1.24)
Chloride: 102 mmol/L (ref 101–111)
GFR calc non Af Amer: 13 mL/min — ABNORMAL LOW (ref >60)
GFR, EST AFRICAN AMERICAN: 16 mL/min — AB (ref >60)
Glucose, Bld: 163 mg/dL — ABNORMAL HIGH (ref 65–99)
Potassium: 5 mmol/L (ref 3.5–5.1)
SODIUM: 134 mmol/L — AB (ref 135–145)

## 2017-04-30 LAB — CBC
HCT: 33.2 % — ABNORMAL LOW (ref 40.0–52.0)
Hemoglobin: 11 g/dL — ABNORMAL LOW (ref 13.0–18.0)
MCH: 30.4 pg (ref 26.0–34.0)
MCHC: 33 g/dL (ref 32.0–36.0)
MCV: 91.9 fL (ref 80.0–100.0)
PLATELETS: 264 10*3/uL (ref 150–440)
RBC: 3.62 MIL/uL — ABNORMAL LOW (ref 4.40–5.90)
RDW: 15.3 % — ABNORMAL HIGH (ref 11.5–14.5)
WBC: 9.9 10*3/uL (ref 3.8–10.6)

## 2017-04-30 LAB — PROTIME-INR
INR: 1.5
PROTHROMBIN TIME: 18 s — AB (ref 11.4–15.2)

## 2017-04-30 LAB — TROPONIN I
TROPONIN I: 9.24 ng/mL — AB (ref <0.03)
TROPONIN I: 12.08 ng/mL — AB (ref <0.03)

## 2017-04-30 LAB — BRAIN NATRIURETIC PEPTIDE
B NATRIURETIC PEPTIDE 5: 4309 pg/mL — AB (ref 0.0–100.0)
B Natriuretic Peptide: 4115 pg/mL — ABNORMAL HIGH (ref 0.0–100.0)

## 2017-04-30 LAB — TSH: TSH: 1.819 u[IU]/mL (ref 0.350–4.500)

## 2017-04-30 LAB — APTT: APTT: 48 s — AB (ref 24–36)

## 2017-04-30 LAB — HEPARIN LEVEL (UNFRACTIONATED): HEPARIN UNFRACTIONATED: 2.04 [IU]/mL — AB (ref 0.30–0.70)

## 2017-04-30 MED ORDER — VITAMIN D 1000 UNITS PO TABS
4000.0000 [IU] | ORAL_TABLET | Freq: Every day | ORAL | Status: DC
  Administered 2017-05-01 – 2017-05-09 (×8): 4000 [IU] via ORAL
  Filled 2017-04-30 (×7): qty 4

## 2017-04-30 MED ORDER — IPRATROPIUM-ALBUTEROL 0.5-2.5 (3) MG/3ML IN SOLN
3.0000 mL | RESPIRATORY_TRACT | Status: DC | PRN
Start: 2017-04-30 — End: 2017-05-10
  Administered 2017-05-01 – 2017-05-03 (×3): 3 mL via RESPIRATORY_TRACT
  Filled 2017-04-30 (×3): qty 3

## 2017-04-30 MED ORDER — ADULT MULTIVITAMIN W/MINERALS CH
1.0000 | ORAL_TABLET | Freq: Every day | ORAL | Status: DC
  Administered 2017-05-01 – 2017-05-10 (×9): 1 via ORAL
  Filled 2017-04-30 (×8): qty 1

## 2017-04-30 MED ORDER — CARVEDILOL 3.125 MG PO TABS
3.1250 mg | ORAL_TABLET | Freq: Two times a day (BID) | ORAL | Status: DC
  Administered 2017-05-03: 3.125 mg via ORAL
  Filled 2017-04-30 (×4): qty 1

## 2017-04-30 MED ORDER — IPRATROPIUM-ALBUTEROL 0.5-2.5 (3) MG/3ML IN SOLN
3.0000 mL | Freq: Four times a day (QID) | RESPIRATORY_TRACT | Status: AC
  Administered 2017-04-30 – 2017-05-01 (×4): 3 mL via RESPIRATORY_TRACT
  Filled 2017-04-30 (×5): qty 3

## 2017-04-30 MED ORDER — FUROSEMIDE 10 MG/ML IJ SOLN
20.0000 mg | Freq: Two times a day (BID) | INTRAMUSCULAR | Status: DC
  Administered 2017-05-01: 20 mg via INTRAVENOUS
  Filled 2017-04-30: qty 2

## 2017-04-30 MED ORDER — NIFEDIPINE ER 60 MG PO TB24
60.0000 mg | ORAL_TABLET | Freq: Two times a day (BID) | ORAL | Status: DC
  Administered 2017-04-30: 60 mg via ORAL
  Filled 2017-04-30 (×2): qty 1

## 2017-04-30 MED ORDER — ALPHA-LIPOIC ACID 200 MG PO CAPS: 1.0000 | ORAL_CAPSULE | Freq: Every day | ORAL | Status: DC

## 2017-04-30 MED ORDER — HEPARIN (PORCINE) IN NACL 100-0.45 UNIT/ML-% IJ SOLN
1250.0000 [IU]/h | INTRAMUSCULAR | Status: DC
  Administered 2017-04-30: 800 [IU]/h via INTRAVENOUS
  Administered 2017-05-03: 1050 [IU]/h via INTRAVENOUS
  Administered 2017-05-06: 1250 [IU]/h via INTRAVENOUS
  Filled 2017-04-30 (×7): qty 250

## 2017-04-30 MED ORDER — FUROSEMIDE 10 MG/ML IJ SOLN
40.0000 mg | Freq: Once | INTRAMUSCULAR | Status: AC
Start: 2017-04-30 — End: 2017-04-30
  Administered 2017-04-30: 40 mg via INTRAVENOUS
  Filled 2017-04-30: qty 4

## 2017-04-30 MED ORDER — ASPIRIN EC 81 MG PO TBEC
81.0000 mg | DELAYED_RELEASE_TABLET | Freq: Every day | ORAL | Status: DC
  Administered 2017-05-01 – 2017-05-10 (×10): 81 mg via ORAL
  Filled 2017-04-30 (×10): qty 1

## 2017-04-30 MED ORDER — ONDANSETRON HCL 4 MG/2ML IJ SOLN: 4.0000 mg | Freq: Four times a day (QID) | INTRAMUSCULAR | Status: DC | PRN

## 2017-04-30 MED ORDER — TRAMADOL HCL 50 MG PO TABS
50.0000 mg | ORAL_TABLET | Freq: Two times a day (BID) | ORAL | Status: DC | PRN
  Administered 2017-04-30 – 2017-05-09 (×9): 50 mg via ORAL
  Filled 2017-04-30 (×10): qty 1

## 2017-04-30 MED ORDER — EZETIMIBE 10 MG PO TABS
10.0000 mg | ORAL_TABLET | Freq: Every day | ORAL | Status: DC
  Administered 2017-05-01 – 2017-05-10 (×9): 10 mg via ORAL
  Filled 2017-04-30 (×10): qty 1

## 2017-04-30 MED ORDER — ACETAMINOPHEN 325 MG PO TABS
650.0000 mg | ORAL_TABLET | ORAL | Status: DC | PRN
  Administered 2017-05-03 – 2017-05-08 (×2): 650 mg via ORAL
  Filled 2017-04-30 (×3): qty 2

## 2017-04-30 MED ORDER — LATANOPROST 0.005 % OP SOLN
1.0000 [drp] | Freq: Every day | OPHTHALMIC | Status: DC
  Administered 2017-04-30 – 2017-05-09 (×10): 1 [drp] via OPHTHALMIC
  Filled 2017-04-30 (×3): qty 2.5

## 2017-04-30 MED ORDER — ATORVASTATIN CALCIUM 20 MG PO TABS
80.0000 mg | ORAL_TABLET | Freq: Every day | ORAL | Status: DC
  Administered 2017-04-30 – 2017-05-05 (×6): 80 mg via ORAL
  Administered 2017-05-06: 40 mg via ORAL
  Administered 2017-05-07 – 2017-05-09 (×3): 80 mg via ORAL
  Filled 2017-04-30 (×10): qty 4

## 2017-04-30 MED ORDER — NITROGLYCERIN 0.4 MG SL SUBL: 0.4000 mg | SUBLINGUAL_TABLET | SUBLINGUAL | Status: DC | PRN

## 2017-04-30 MED ORDER — ISOSORBIDE MONONITRATE ER 60 MG PO TB24
60.0000 mg | ORAL_TABLET | Freq: Every day | ORAL | Status: DC
  Administered 2017-05-01 – 2017-05-10 (×10): 60 mg via ORAL
  Filled 2017-04-30 (×2): qty 2
  Filled 2017-04-30: qty 1
  Filled 2017-04-30: qty 2
  Filled 2017-04-30 (×3): qty 1
  Filled 2017-04-30 (×3): qty 2

## 2017-04-30 MED ORDER — CO Q10 200 MG PO CAPS: 1.0000 | ORAL_CAPSULE | Freq: Every day | ORAL | Status: DC

## 2017-04-30 MED ORDER — CALCIUM CITRATE 950 (200 CA) MG PO TABS
200.0000 mg | ORAL_TABLET | Freq: Every day | ORAL | Status: DC
  Administered 2017-05-01 – 2017-05-10 (×8): 200 mg via ORAL
  Filled 2017-04-30 (×10): qty 1

## 2017-04-30 MED ORDER — OMEGA-3-ACID ETHYL ESTERS 1 G PO CAPS
ORAL_CAPSULE | Freq: Two times a day (BID) | ORAL | Status: DC
  Administered 2017-05-01 – 2017-05-02 (×3): 1 g via ORAL
  Administered 2017-05-02: 23:00:00 via ORAL
  Administered 2017-05-03 – 2017-05-08 (×8): 1 g via ORAL
  Filled 2017-04-30 (×16): qty 1

## 2017-04-30 MED ORDER — ALPRAZOLAM 0.25 MG PO TABS
0.2500 mg | ORAL_TABLET | Freq: Two times a day (BID) | ORAL | Status: DC | PRN
  Administered 2017-05-01 – 2017-05-10 (×9): 0.25 mg via ORAL
  Filled 2017-04-30 (×9): qty 1

## 2017-04-30 MED ORDER — BUDESONIDE 0.5 MG/2ML IN SUSP
0.5000 mg | Freq: Two times a day (BID) | RESPIRATORY_TRACT | Status: DC
  Administered 2017-05-01 – 2017-05-03 (×5): 0.5 mg via RESPIRATORY_TRACT
  Filled 2017-04-30 (×7): qty 2

## 2017-04-30 MED ORDER — ASPIRIN 81 MG PO CHEW
324.0000 mg | CHEWABLE_TABLET | Freq: Once | ORAL | Status: AC
  Administered 2017-04-30: 324 mg via ORAL
  Filled 2017-04-30: qty 4

## 2017-04-30 MED ORDER — TIMOLOL MALEATE 0.25 % OP SOLN
1.0000 [drp] | Freq: Two times a day (BID) | OPHTHALMIC | Status: DC
  Administered 2017-05-01 – 2017-05-10 (×20): 1 [drp] via OPHTHALMIC
  Filled 2017-04-30 (×2): qty 5

## 2017-04-30 MED ORDER — LEVOTHYROXINE SODIUM 50 MCG PO TABS
75.0000 ug | ORAL_TABLET | Freq: Every day | ORAL | Status: DC
  Administered 2017-05-01 – 2017-05-10 (×10): 75 ug via ORAL
  Filled 2017-04-30 (×2): qty 2
  Filled 2017-04-30 (×2): qty 1
  Filled 2017-04-30 (×3): qty 2
  Filled 2017-04-30: qty 1
  Filled 2017-04-30: qty 2
  Filled 2017-04-30: qty 1

## 2017-04-30 MED ORDER — ASPIRIN 81 MG PO CHEW
81.0000 mg | CHEWABLE_TABLET | ORAL | Status: AC
  Administered 2017-04-30: 81 mg via ORAL
  Filled 2017-04-30: qty 1

## 2017-04-30 MED ORDER — IRBESARTAN 75 MG PO TABS: 37.5000 mg | ORAL_TABLET | Freq: Every day | ORAL | Status: DC

## 2017-04-30 NOTE — ED Notes (Signed)
Pt taken off of Bipap and placed on 6L via nasal canula; O2 sats of 92%

## 2017-04-30 NOTE — Telephone Encounter (Signed)
Pt c/o Shortness Of Breath: STAT if SOB developed within the last 24 hours or pt is noticeably SOB on the phone  1. Are you currently SOB (can you hear that pt is SOB on the phone)? Yes, unable to sleep, only able to get short breaths  2. How long have you been experiencing SOB? This weekend has gotten worse  3. Are you SOB when sitting or when up moving around? All the time.  4. Are you currently experiencing any other symptoms? No , just unable to sleep

## 2017-04-30 NOTE — Progress Notes (Signed)
Cardiology Office Note Date:  04/30/2017  Patient ID:  Dennis Burns, DOB 1928-12-18, MRN [id number] PCP:  [name], MD  Cardiologist:  Dr. [name], MD    Chief Complaint: SOB  History of Present Illness: Dennis Burns is a 84 y.o. male with history of CAD s/p CABG x 4 in 2003, pulmonary hypertension, valvular heart disease, chronic SOB, carotid artery disease s/p right-sided CEA, PAD, DVT on Xarelto, HTN, HLD, prostate cancer, CKD stage III with unilateral kidney, sciatica, and spinal stenosis s/p prior back surgeries who presents for worsening SOB.   Myoview 01/2017 for SOB showed a large region of ischemia noted in the lateral and mid to apical wall, fixed defect in the inferolateral wall EF 23%, high risk scan. Follow up echo in 01/2017 showed EF 55-60%, normal wall motion, Gr2DD, moderate aortic stenosis, moderate aortic regurgitation, mild mitral regurgitation, mildly dilated left atrium, mildly reduced RV systolic function. Cardiac cath from 02/14/2017 showed severe underlying heavily calcified three-vessel CAD, left main 80%, proximal to mid LAD 100%, ostial LCX 99%, OM1 90%, ostial RCA 100%. LIMA to LAD patent, SVG-distal RCA with mild luminal irregularities. SVG to OM was felt to possibly be occluded (versus mild competitive flow in the distal LCx) and was not visable on ascending aortogram. RHC showed only mildly elevated filling pressures, moderate pulmonary hypertension and normal cardiac output. Pulmonary wedge pressure was 16 mmHg, PA pressure was 55 over 13 mmHg and cardiac output was 5.84 with a cardiac index of 3.5. LV gram was not performed given CKD. The native LCx was felt to be too high risk for PCI given involvement of the left main, ostial location and heavy calcifications. Medical management was advised.   He was most recently seen by Dr. [name] on 03/16/2017 for follow up and noted his SOB had improved some after increasing Imdur to bid dosing. He deferred starting  Revatio at that time. Weight noted to be 137 at that time.   He called this morning indicating he was more SOB. Weight in the office today noted to be 147 pounds (up 10 pounds from 03/2017 office visit).    Patient comes in today accompanied by his wife.  He reports since 2/21 he has noted significant increase and shortness of breath that occurs with minimal activity such as putting on or taking off his shoes or ambulating across a small bedroom.  At baseline, he was walking with his wife on a near daily basis multiple blocks and tolerating well.  He has also noted increased lower extremity swelling, abdominal distention, cough, and orthopnea.  He reports that since 2/21 he has barely slept given his associated shortness of breath when attempting to lay supine.  He has been compliant with all medications.  No recent dietary changes.  Initially, on 2/21, he did take an extra 2 Imdur for a total of 120 mg that day, and reported this seemed to have helped briefly.  However, by 2/23 his symptoms of significant shortness of breath had returned prompting him to ultimately call our office for an appointment on 2/25.  No associated chest pain, diaphoresis, or syncope.  Over the weekend, his blood pressure had been running in the 1 teens systolic.  He has taken carvedilol 6.25 mg, Imdur 60 mg, and nifedipine this morning prior to his cardiology appointment.  He has not yet taken Lasix, second dose of Coreg, or Diovan.   Past Medical History:  Diagnosis Date  . Allergic rhinitis due to pollen  as child--better now  . Chronic kidney disease, stage III (moderate) (HCC)   . Coronary artery disease   . DVT, lower extremity, recurrent ([place])   . Glaucoma    [facility]   . History of prostate cancer 2004  . Hyperlipidemia   . Hypertension   . Hypothyroidism   . Impaired fasting glucose   . Spinal stenosis of lumbar region with radiculopathy     Past Surgical History:  Procedure Laterality Date  . CAROTID  ENDARTERECTOMY Right 09/2004   [place] GRAFT  2003   [place]   after fall  . INSERTION PROSTATE RADIATION SEED  2004   and RT  . POSTERIOR LUMBAR FUSION  2012, 2014  . RIGHT/LEFT HEART CATH AND CORONARY ANGIOGRAPHY N/A 02/14/2017   Procedure: RIGHT/LEFT HEART CATH AND CORONARY ANGIOGRAPHY;  Surgeon: Wellington Hampshire, MD;  Location: [place] [facility];  Service: Cardiovascular;  Laterality: N/A;    Current Meds  Medication Sig  . Alpha-Lipoic Acid 200 MG CAPS Take 1 capsule by mouth daily.  Marland Kitchen atorvastatin (LIPITOR) 80 MG tablet Take 1 tablet (80 mg total) by mouth daily.  Marland Kitchen CALCIUM CITRATE PO Take 1 tablet by mouth daily. Takes 300mg  daily.  . carvedilol (COREG) 6.25 MG tablet Take 1 tablet (6.25 mg total) by mouth 2 (two) times daily.  . Cholecalciferol (VITAMIN D) 2000 units CAPS Take 4,000 Units by mouth daily.  . Coenzyme Q10 (CO Q10) 200 MG CAPS Take 1 capsule by mouth daily.  Marland Kitchen CRANBERRY EXTRACT PO Take 1 tablet by mouth daily. Takes 1500mg  daily  . ezetimibe (ZETIA) 10 MG tablet Take 1 tablet (10 mg total) by mouth daily.  . furosemide (LASIX) 40 MG tablet Take 40 mg by mouth daily.  . isosorbide mononitrate (IMDUR) 30 MG 24 hr tablet Take 2 tablets (60 mg total) by mouth daily.  Marland Kitchen latanoprost (XALATAN) 0.005 % ophthalmic solution Place 1 drop into both eyes at bedtime. In morning  . levothyroxine (SYNTHROID, LEVOTHROID) 75 MCG tablet Take 1 tablet (75 mcg total) by mouth daily.  . Multiple Vitamin (MULTIVITAMIN) tablet Take 1 tablet by mouth daily.  Marland Kitchen NIFEdipine (PROCARDIA-XL/ADALAT CC) 60 MG 24 hr tablet Take 1 tablet (60 mg total) by mouth 2 (two) times daily.  . nitroGLYCERIN (NITROSTAT) 0.4 MG SL tablet Place 1 tablet (0.4 mg total) under the tongue every 5 (five) minutes as needed for chest pain.  [name] 3-6-9 Fatty Acids (OMEGA-3-6-9 PO) Take 1 capsule by mouth 2 (two) times daily.  . Rivaroxaban (XARELTO)  15 MG TABS tablet Take 1 tablet (15 mg total) by mouth daily with supper.  . timolol (TIMOPTIC) 0.25 % ophthalmic solution Place 1 drop into both eyes 2 (two) times daily. am  . traMADol (ULTRAM) 50 MG tablet Take 2 tablets (100 mg total) by mouth 3 (three) times daily as needed.  . valsartan (DIOVAN) 80 MG tablet Take 1 tablet (80 mg total) by mouth 2 (two) times daily.    Allergies:   Patient has no known allergies.   Social History:  The patient  reports that he quit smoking about 41 years ago. he has never used smokeless tobacco. He reports that he drinks alcohol. He reports that he does not use drugs.   Family History:  The patient's family history includes Cancer in his father and mother; Glaucoma in his mother; Stroke in his maternal grandfather.  ROS:  Review of Systems  Constitutional: Positive for malaise/fatigue. Negative for chills, diaphoresis, fever and weight loss.  HENT: Negative for congestion.   Eyes: Negative for discharge and redness.  Respiratory: Positive for cough and shortness of breath. Negative for hemoptysis, sputum production and wheezing.   Cardiovascular: Positive for orthopnea, leg swelling and PND. Negative for chest pain, palpitations and claudication.  Gastrointestinal: Negative for abdominal pain, blood in stool, heartburn, melena, nausea and vomiting.  Genitourinary: Negative for hematuria.  Musculoskeletal: Negative for falls and myalgias.  Skin: Negative for rash.  Neurological: Positive for dizziness and weakness. Negative for tingling, tremors, sensory change, speech change, focal weakness and loss of consciousness.  Endo/Heme/Allergies: Does not bruise/bleed easily.  Psychiatric/Behavioral: Negative for substance abuse. The patient is not nervous/anxious.   All other systems reviewed and are negative.    PHYSICAL EXAM:  VS:  BP (!) 94/42 (BP Location: Right Arm, Patient Position: Sitting, Cuff Size: Normal)   Pulse 73   Ht 5\' 3"  (1.6 m)   Wt  147 lb 12 oz (67 kg)   SpO2 (!) 85%   BMI 26.17 kg/m  BMI: Body mass index is 26.17 kg/m.  Physical Exam  Constitutional: He is oriented to person, place, and time. He appears well-developed and well-nourished.  HENT:  Head: Normocephalic and atraumatic.  Eyes: Right eye exhibits no discharge. Left eye exhibits no discharge.  Neck: Normal range of motion. JVD present.  JVD elevated approximately 8-10 cm  Cardiovascular: Normal rate, regular rhythm, S1 normal and S2 normal. Exam reveals no distant heart sounds, no friction rub, no midsystolic click and no opening snap.  Murmur heard.  Harsh midsystolic murmur is present with a grade of 2/6 at the upper right sternal border radiating to the neck. High-pitched blowing holosystolic murmur of grade 1/6 is also present at the apex. Pulses:      Posterior tibial pulses are 1+ on the right side, and 1+ on the left side.  Pulmonary/Chest: Effort normal. No respiratory distress. He has decreased breath sounds. He has no wheezes. He has no rales. He exhibits no tenderness.  Abdominal: Soft. He exhibits no distension. There is no tenderness.  Musculoskeletal: He exhibits edema.  1+ pitting edema to the bilateral mid shins  Neurological: He is alert and oriented to person, place, and time.  Skin: Skin is warm and dry. No cyanosis. Nails show no clubbing.  Psychiatric: He has a normal mood and affect. His speech is normal and behavior is normal. Judgment and thought content normal.      EKG:  Was ordered and interpreted by me today. Shows NSR, 73 bpm, anterolateral TWI (worse than 03/2017 study)  Recent Labs: 05/03/2016: TSH 0.86 02/05/2017: Hemoglobin 12.7; Platelets 181 02/15/2017: BUN 21; Creatinine, Ser 1.25; Potassium 4.4; Sodium 140  No results found for requested labs within last 8760 hours.   CrCl cannot be calculated (Patient's most recent lab result is older than the maximum 21 days allowed.).   Wt Readings from Last 3 Encounters:    04/30/17 147 lb (66.7 kg)  04/30/17 147 lb 12 oz (67 kg)  03/16/17 137 lb (62.1 kg)     Other studies reviewed: Additional studies/records reviewed today include: summarized above  ASSESSMENT AND PLAN:  1. Acute respiratory failure with hypoxia: Place on nasal cannula at 2 L.  Likely multifactorial including pulmonary hypertension, acute on chronic diastolic CHF, and underlying CAD.  Transfer to the ED for further evaluation and possible/likely hospital admission.  2. Hypotension: Hold medications  as below.  May require inotropic support to augment diuresis.  3. CAD of native coronary artery and bypass grafts with stable angina: No symptoms concerning for worsening angina at this time.  Recent cardiac catheterization in 02/2017 as above.  Has been on Xarelto in place of aspirin.  Coreg and Imdur on hold given hypotension.  4. Pulmonary hypertension/acute on chronic diastolic CHF: He does appear volume overloaded on exam today.  Weight is up 10 pounds when compared to 03/2017 visit at our office.  His hypotension and hypoxia preclude outpatient diuresis at this time.  He will likely require inotropic support to augment diuresis.  Patient has been placed on supplemental oxygen via nasal cannula as above.  He has been [facility] for further evaluation and possible hospital admission.  Cardiology to see the patient in consultation upon admission.  Given the patient was hypotensive and hypoxic in the office it was felt initial evaluation in the ED was warranted rather than a direct admission.  5. HLD: Most recent LDL of 54 from 12/2016. Lipitor and Zetia.   6. HTN: BP soft as above. Hold valsartan, Imdur, Lasix, Coreg, Diovan.   7. Recurrent DVT: On Xarelto per PCP. Consider changing to Eliquis given CKD.   8. CKD stage III: We will need close monitoring of renal function with diuresis.  Suspect there may be transient elevation in his serum creatinine given his relative hypotension as  above.  Consider nephrology consultation upon admission.  Disposition: F/u with Dr. [name] following hospital/likely admission.   Current medicines are reviewed at length with the patient today.  The patient did not have any concerns regarding medicines.  Signed, Christell Faith, PA-C 04/30/2017 2:36 PM     Dennis Burns [address], [place] [id number] [phone]

## 2017-04-30 NOTE — Patient Instructions (Signed)
Patient taken to the ED.

## 2017-04-30 NOTE — ED Notes (Signed)
Pharmacy called for IV Heprin

## 2017-04-30 NOTE — ED Notes (Signed)
Patient transported to X-ray 

## 2017-04-30 NOTE — Consult Note (Signed)
Cardiology Consultation:   Patient ID: Dennis Burns; [id number]; June 19, 1928   Admit date: 04/30/2017 Date of Consult: 04/30/2017  Primary Care Provider: [name], MD Primary Cardiologist: Dr. [name] Primary Electrophysiologist:  n/a   Patient Profile:   Dennis Burns is a 84 y.o. male with a hx of coronary artery disease status post CABG, pulmonary hypertension, chronic kidney disease and multiple other comorbidities who is being seen today for the evaluation of non-ST elevation myocardial infarction at the request of Dr. Alfred Levins.  History of Present Illness:   Dennis Burns is an 84 year old male with complex medical problems that include coronary artery disease status post CABG in 2003 , pulmonary hypertension, valvular heart disease, chronic SOB, carotid artery disease s/p right-sided CEA, PAD, DVT on Xarelto, HTN, HLD, prostate cancer, CKD stage III with unilateral kidney, sciatica, and spinal stenosis s/p prior back surgeries.  The patient underwent cardiac catheterization in December 2018 that showed severe underlying heavily calcified three-vessel coronary artery disease with patent LIMA to LAD and SVG to distal RCA.  SVG to OM was likely occluded but could not be selectively engaged from the left radial artery.  The patient was treated medically.  He does have underlying chronic kidney disease with only one functioning kidney.  At the time of cardiac catheterization, a total of 115 mL of contrast was used.  His renal function was stable next day.  However, it does not seem that it was checked since then. He saw Dr. [name] in mid January and was doing reasonably well but with continued shortness of breath.  Shortly after that, the patient describes a significant decline in shortness of breath and fatigue.  Over that same period, he developed a 10 pound weight gain with significant leg edema, orthopnea and PND. He also developed symptoms of runny nose and congestion with  possible viral illness. He came to the clinic today with these worsening symptoms and was sent to the emergency room for evaluation.  His EKG showed more pronounced anterolateral ST depression with some ST elevation in aVR.  His troponin was elevated at 9.  His renal function was noted to be worse with a creatinine of 3.73. Patient denies any chest pain.   Past Medical History:  Diagnosis Date  . Allergic rhinitis due to pollen    as child--better now  . Chronic kidney disease, stage III (moderate) (HCC)   . Coronary artery disease   . DVT, lower extremity, recurrent ([place])   . Glaucoma    [facility]   . History of prostate cancer 2004  . Hyperlipidemia   . Hypertension   . Hypothyroidism   . Impaired fasting glucose   . Spinal stenosis of lumbar region with radiculopathy     Past Surgical History:  Procedure Laterality Date  . CAROTID ENDARTERECTOMY Right 09/2004   [place] GRAFT  2003   [place]   after fall  . INSERTION PROSTATE RADIATION SEED  2004   and RT  . POSTERIOR LUMBAR FUSION  2012, 2014  . RIGHT/LEFT HEART CATH AND CORONARY ANGIOGRAPHY N/A 02/14/2017   Procedure: RIGHT/LEFT HEART CATH AND CORONARY ANGIOGRAPHY;  Surgeon: Wellington Hampshire, MD;  Location: [place] [facility];  Service: Cardiovascular;  Laterality: N/A;     Home Medications:  Prior to Admission medications   Medication Sig Start Date End Date Taking? Authorizing Provider  Alpha-Lipoic Acid 200 MG CAPS Take 1 capsule by  mouth daily.   Yes [provider]  atorvastatin (LIPITOR) 80 MG tablet Take 1 tablet (80 mg total) by mouth daily. 02/16/17  Yes Minna Merritts, MD  CALCIUM CITRATE PO Take 1 tablet by mouth daily. Takes 300mg  daily.   Yes [provider]  carvedilol (COREG) 6.25 MG tablet Take 1 tablet (6.25 mg total) by mouth 2 (two) times daily. 03/20/17  Yes Minna Merritts, MD  Cholecalciferol (VITAMIN D)  2000 units CAPS Take 4,000 Units by mouth daily.   Yes [provider]  Coenzyme Q10 (CO Q10) 200 MG CAPS Take 1 capsule by mouth daily.   Yes [provider]  CRANBERRY EXTRACT PO Take 1 tablet by mouth daily. Takes 1500mg  daily   Yes [provider]  furosemide (LASIX) 40 MG tablet Take 40 mg by mouth daily.   Yes [provider]  isosorbide mononitrate (IMDUR) 30 MG 24 hr tablet Take 2 tablets (60 mg total) by mouth daily. 02/15/17 08/14/17 Yes Cheryln Manly, NP  latanoprost (XALATAN) 0.005 % ophthalmic solution Place 1 drop into both eyes at bedtime. In morning 10/27/15  Yes [provider]  levothyroxine (SYNTHROID, LEVOTHROID) 75 MCG tablet Take 1 tablet (75 mcg total) by mouth daily. 07/21/16  Yes [name], MD  Multiple Vitamin (MULTIVITAMIN) tablet Take 1 tablet by mouth daily.   Yes [provider]  NIFEdipine (PROCARDIA-XL/ADALAT CC) 60 MG 24 hr tablet Take 1 tablet (60 mg total) by mouth 2 (two) times daily. 03/20/17  Yes Gollan, Kathlene November, MD  nitroGLYCERIN (NITROSTAT) 0.4 MG SL tablet Place 1 tablet (0.4 mg total) under the tongue every 5 (five) minutes as needed for chest pain. 07/25/16 04/30/17 Yes Gollan, Kathlene November, MD  Omega 3-6-9 Fatty Acids (OMEGA-3-6-9 PO) Take 1 capsule by mouth 2 (two) times daily.   Yes [provider]  Rivaroxaban (XARELTO) 15 MG TABS tablet Take 1 tablet (15 mg total) by mouth daily with supper. 10/27/16  Yes Gollan, Kathlene November, MD  timolol (TIMOPTIC) 0.25 % ophthalmic solution Place 1 drop into both eyes 2 (two) times daily. am 08/14/15  Yes [provider]  traMADol (ULTRAM) 50 MG tablet Take 2 tablets (100 mg total) by mouth 3 (three) times daily as needed. 03/01/17  Yes [name], MD  valsartan (DIOVAN) 80 MG tablet Take 1 tablet (80 mg total) by mouth 2 (two) times daily. 02/16/17  Yes Minna Merritts, MD  ezetimibe (ZETIA) 10 MG tablet Take 1 tablet (10 mg total) by  mouth daily. 02/16/17   Minna Merritts, MD    Inpatient Medications: Scheduled Meds:  Continuous Infusions: . heparin     PRN Meds:   Allergies:   No Known Allergies  Social History:   Social History   Socioeconomic History  . Marital status: Married    Spouse name: Not on file  . Number of children: 5  . Years of education: Not on file  . Highest education level: Not on file  Social Needs  . Financial resource strain: Not on file  . Food insecurity - worry: Not on file  . Food insecurity - inability: Not on file  . Transportation needs - medical: Not on file  . Transportation needs - non-medical: Not on file  Occupational History  . 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 Smoking status: Former Smoker    Last attempt to quit: 1978    Years since quitting:  41.1  . Smokeless tobacco: Never Used  Substance and Sexual Activity  . Alcohol use: Yes  . Drug use: No  . Sexual activity: Not on file  Other Topics Concern  . Not on file  Social History Narrative   2nd marriage--- 1980   5 children--2 step children   12 grandchildren   83 great grandchildren      Has living will   Wife is health care POA   Would accept resuscitation   Not sure about tube feeds    Family History:    Family History  Problem Relation Age of Onset  . Cancer Mother        colon (cause of death) and breast  . Glaucoma Mother   . Cancer Father        colon  . Stroke Maternal Grandfather   . Diabetes Neg Hx      ROS:  Please see the history of present illness.   All other ROS reviewed and negative.     Physical Exam/Data:   Vitals:   04/30/17 1559 04/30/17 1600 04/30/17 1600 04/30/17 1630  BP:  (!) 94/57 (!) 94/57 (!) 102/57  Pulse:  70 65 68  Resp:  19 (!) 21 16  Temp:      TempSrc:      SpO2: (!) 89% (!) 88% (!) 88% 98%  Weight:      Height:       No intake or output data in the 24 hours ending 04/30/17 1743 Filed  Weights   04/30/17 1419  Weight: 147 lb (66.7 kg)   Body mass index is 26.04 kg/m.  General:  Well nourished, well developed, in no acute distress HEENT: normal Lymph: no adenopathy Neck: Moderate JVD Endocrine:  No thryomegaly Vascular: FA pulses 2+ bilaterally without bruits  Cardiac:  normal S1, S2; RRR; 2 out of 6 systolic ejection murmur at the base Lungs: Diminished breath sounds at the base, no wheezing, rhonchi or rales  Abd: soft, nontender, no hepatomegaly  Ext: Moderate bilateral leg edema Musculoskeletal:  No deformities, BUE and BLE strength normal and equal Skin: warm and dry  Neuro:  CNs 2-12 intact, no focal abnormalities noted Psych:  Normal affect   EKG:  The EKG was personally reviewed and demonstrates: Normal sinus rhythm with anterolateral ST depression suggestive of ischemia with minor ST elevation in aVR.  He did have these changes in the past but are more pronounced now   Relevant CV Studies:   Laboratory Data:  Chemistry Recent Labs  Lab 04/30/17 1447  NA 134*  K 5.0  CL 102  CO2 17*  GLUCOSE 163*  BUN 94*  CREATININE 3.73*  CALCIUM 8.5*  GFRNONAA 13*  GFRAA 16*  ANIONGAP 15    No results for input(s): PROT, ALBUMIN, AST, ALT, ALKPHOS, BILITOT in the last 168 hours. Hematology Recent Labs  Lab 04/30/17 1447  WBC 9.9  RBC 3.62*  HGB 11.0*  HCT 33.2*  MCV 91.9  MCH 30.4  MCHC 33.0  RDW 15.3*  PLT 264   Cardiac Enzymes Recent Labs  Lab 04/30/17 1447  TROPONINI 9.24*   No results for input(s): TROPIPOC in the last 168 hours.  BNPNo results for input(s): BNP, PROBNP in the last 168 hours.  DDimer No results for input(s): DDIMER in the last 168 hours.  Radiology/Studies:  Dg Chest 2 View  Result Date: 04/30/2017 CLINICAL DATA:  Shortness of breath. History of coronary artery disease and CABG, pulmonary hypertension, chronic  renal insufficiency, peripheral vascular disease, former smoker. EXAM: CHEST  2 VIEW COMPARISON:  PA and  lateral chest x-ray of Jul 22, 2016 FINDINGS: The lungs are adequately inflated. There is a moderate-sized right pleural effusion and small left pleural effusion. The cardiac silhouette is mildly enlarged. The central pulmonary vascularity is engorged. There are post median sternotomy changes. There is calcification in the wall of the aortic arch and descending thoracic aorta. The observed bony thorax exhibits no acute abnormality. The patient has undergone previous posterior fusion in the upper lumbar spine. IMPRESSION: CHF with mild interstitial edema and bilateral pleural effusions new since the previous study. There is underlying chronic bronchitic change. Thoracic aortic atherosclerosis. Electronically Signed   By: David  Martinique M.D.   On: 04/30/2017 14:56   US Venous Img Lower Bilateral  Result Date: 04/30/2017 CLINICAL DATA:  Asymmetric leg swelling, worse on the right EXAM: BILATERAL LOWER EXTREMITY VENOUS DOPPLER ULTRASOUND TECHNIQUE: Gray-scale sonography with graded compression, as well as color Doppler and duplex ultrasound were performed to evaluate the lower extremity deep venous systems from the level of the common femoral vein and including the common femoral, femoral, profunda femoral, popliteal and calf veins including the posterior tibial, peroneal and gastrocnemius veins when visible. The superficial great saphenous vein was also interrogated. Spectral Doppler was utilized to evaluate flow at rest and with distal augmentation maneuvers in the common femoral, femoral and popliteal veins. COMPARISON:  None. FINDINGS: RIGHT LOWER EXTREMITY Common Femoral Vein: No evidence of thrombus. Normal compressibility, respiratory phasicity and response to augmentation. Saphenofemoral Junction: No evidence of thrombus. Normal compressibility and flow on color Doppler imaging. Profunda Femoral Vein: No evidence of thrombus. Normal compressibility and flow on color Doppler imaging. Femoral Vein: No evidence  of thrombus. Normal compressibility, respiratory phasicity and response to augmentation. Popliteal Vein: No evidence of thrombus. Normal compressibility, respiratory phasicity and response to augmentation. Calf Veins: No evidence of thrombus. Normal compressibility and flow on color Doppler imaging. Superficial Great Saphenous Vein: No evidence of thrombus. Normal compressibility. Venous Reflux:  None. Other Findings:  None. LEFT LOWER EXTREMITY Common Femoral Vein: No evidence of thrombus. Normal compressibility, respiratory phasicity and response to augmentation. Saphenofemoral Junction: No evidence of thrombus. Normal compressibility and flow on color Doppler imaging. Profunda Femoral Vein: No evidence of thrombus. Normal compressibility and flow on color Doppler imaging. Femoral Vein: No evidence of thrombus. Normal compressibility, respiratory phasicity and response to augmentation. Popliteal Vein: No evidence of thrombus. Normal compressibility, respiratory phasicity and response to augmentation. Calf Veins: No evidence of thrombus. Normal compressibility and flow on color Doppler imaging. Superficial Great Saphenous Vein: No evidence of thrombus. Normal compressibility. Venous Reflux:  None. Other Findings:  None. IMPRESSION: No evidence of deep venous thrombosis. Electronically Signed   By: Jerilynn Mages.  Shick M.D.   On: 04/30/2017 15:54    Assessment and Plan:   1. Non-ST elevation myocardial infarction: The exact onset is not entirely clear but the patient has been feeling poorly for the last few weeks.  Recommend daily aspirin and unfractionated heparin.  Continue treatment with a statin.  His blood pressure is somewhat on the low side and thus we will hold his antihypertensive medications.  The patient ideally should undergo a repeat cardiac catheterization via the femoral approach given the difficulty with radial approach last time.  However, not able to do at the present time given significant volume  overload and also worsening renal function. 2. Acute on chronic systolic heart failure: The patient appears to  be significantly volume overloaded and is at least 10 pounds above his dry weight.  I recommend diuresis with furosemide 40 mg intravenously twice daily.  The dose might need to be increased based on response given degree of renal failure.  Recommend repeat echocardiogram. 3. Acute on chronic kidney disease: This is very concerning and could be due to cardiorenal syndrome or contrast-induced nephropathy after cardiac catheterization in December.  Recommend nephrology consultation as the patient might progress to end-stage renal disease. Given the patient's age and comorbidities, his overall prognosis is poor.   For questions or updates, please contact [place] Please consult [email or web address] for contact info under Cardiology/STEMI.   Signed, [name], MD  04/30/2017 5:43 PM

## 2017-04-30 NOTE — ED Triage Notes (Signed)
C/O SOB since Thursday.  Symptoms worse at night time.  Sats were 85% on Room Air at Cardiologists office today, placed on 2l/ [place] and sent to ED for evaluation.

## 2017-04-30 NOTE — ED Notes (Signed)
Called to notify floor that pt is enroute.

## 2017-04-30 NOTE — Progress Notes (Signed)
ANTICOAGULATION CONSULT NOTE - Initial Consult  Pharmacy Consult for heparin drip Indication: chest pain/ACS  No Known Allergies  Patient Measurements: Height: 5\' 3"  (160 cm) Weight: 147 lb (66.7 kg) IBW/kg (Calculated) : 56.9 Heparin Dosing Weight: 67 kg  Vital Signs: Temp: 97.8 F (36.6 C) (02/25 1421) Temp Source: Oral (02/25 1421) BP: 94/57 (02/25 1600) Pulse Rate: 65 (02/25 1600)  Labs: Recent Labs    04/30/17 1447  HGB 11.0*  HCT 33.2*  PLT 264  CREATININE 3.73*  TROPONINI 9.24*    Estimated Creatinine Clearance: 11.4 mL/min (A) (by C-G formula based on SCr of 3.73 mg/dL (H)).   Medical History: Past Medical History:  Diagnosis Date  . Allergic rhinitis due to pollen    as child--better now  . Chronic kidney disease, stage III (moderate) (HCC)   . Coronary artery disease   . DVT, lower extremity, recurrent ([place])   . Glaucoma    [facility]   . History of prostate cancer 2004  . Hyperlipidemia   . Hypertension   . Hypothyroidism   . Impaired fasting glucose   . Spinal stenosis of lumbar region with radiculopathy     Assessment: Pharmacy consulted to dose and monitor heparin drip in this 84 year old man for ACS.  Patient was taking rivaroxaban PTA and reports last dose taken on 2/24 around 2000.   Baseline labs ordered including baseline heparin level. If baseline heparin level is elevated, will need to dose/monitor using  APTT until levels correlate.  Goal of Therapy:  APTT 66-102 seconds Heparin level 0.3-0.7 units/ml Monitor platelets by anticoagulation protocol: Yes   Plan:  No bolus as patient is currently anticoagulated on rivaroxaban Will start heparin 800 units/hr 24 hours after last rivaroxaban dose per protocol Will check HL and APTT in 8 hours and CBC daily.  Lenis Noon, PharmD, BCPS Clinical Pharmacist 04/30/2017,4:15 PM

## 2017-04-30 NOTE — H&P (Signed)
[facility] [place] NAME: Dennis Burns    MR#:  [id number]  DATE OF BIRTH:  Jan 23, 1929  DATE OF ADMISSION:  04/30/2017  PRIMARY CARE PHYSICIAN: [name], MD   REQUESTING/REFERRING PHYSICIAN:   CHIEF COMPLAINT:   Chief Complaint  Patient presents with  . Shortness of Breath    HISTORY OF PRESENT ILLNESS: Dennis Burns  is a 84 y.o. male with a known history per below which also includes severe coronary artery disease, status post heart cath in December 2018 noted for severe three-vessel disease-occluded LAD, echocardiogram November 2018 noted for ejection fraction 55-60% with stage II diastolic dysfunction, presents from cardiology office with hypoxia, shortness of breath since Thursday/5 days ago, noted 10 pound weight gain over the last month, worsening lower extremity edema, has had intermittent chest pain over the last several months-none recent per patient, in the emergency room patient was tachypneic, hypoxic-did require BiPAP but was successfully weaned off, initial blood pressure in the 98X systolic, creatinine 3.7 with baseline 1.2, lower extremity Dopplers negative for DVT, troponin was 9.2, hemoglobin 11, patient evaluated with wife at the bedside, patient is now been admitted for acute non-STEMI, acute on chronic diastolic congestive heart failure exacerbation, acute renal failure.  PAST MEDICAL HISTORY:   Past Medical History:  Diagnosis Date  . Allergic rhinitis due to pollen    as child--better now  . Chronic kidney disease, stage III (moderate) (HCC)   . Coronary artery disease   . DVT, lower extremity, recurrent ([place])   . Glaucoma    [facility]   . History of prostate cancer 2004  . Hyperlipidemia   . Hypertension   . Hypothyroidism   . Impaired fasting glucose   . Spinal stenosis of lumbar region with radiculopathy     PAST SURGICAL HISTORY:  Past Surgical History:  Procedure Laterality Date  . CAROTID  ENDARTERECTOMY Right 09/2004   [place] GRAFT  2003   [place]   after fall  . INSERTION PROSTATE RADIATION SEED  2004   and RT  . POSTERIOR LUMBAR FUSION  2012, 2014  . RIGHT/LEFT HEART CATH AND CORONARY ANGIOGRAPHY N/A 02/14/2017   Procedure: RIGHT/LEFT HEART CATH AND CORONARY ANGIOGRAPHY;  Surgeon: Wellington Hampshire, MD;  Location: [facility];  Service: Cardiovascular;  Laterality: N/A;    SOCIAL HISTORY:  Social History   Tobacco Use  . Smoking status: Former Smoker    Last attempt to quit: 1978    Years since quitting: 41.1  . Smokeless tobacco: Never Used  Substance Use Topics  . Alcohol use: Yes    FAMILY HISTORY:  Family History  Problem Relation Age of Onset  . Cancer Mother        colon (cause of death) and breast  . Glaucoma Mother   . Cancer Father        colon  . Stroke Maternal Grandfather   . Diabetes Neg Hx     DRUG ALLERGIES: No Known Allergies  REVIEW OF SYSTEMS:   CONSTITUTIONAL: No fever,+ fatigue /weakness.  EYES: No blurred or double vision.  EARS, NOSE, AND THROAT: No tinnitus or ear pain.  RESPIRATORY: No cough, +shortness of breath,no wheezing or hemoptysis.  CARDIOVASCULAR: Intermittent chest pain, noorthopnea,+ lower extremity edema.  GASTROINTESTINAL: No nausea, vomiting, diarrhea or abdominal pain.  GENITOURINARY: No dysuria, hematuria.  ENDOCRINE: No polyuria, nocturia,  HEMATOLOGY: No anemia, easy bruising or bleeding SKIN: No rash or lesion. MUSCULOSKELETAL: No joint pain or arthritis.   NEUROLOGIC: No tingling, numbness, weakness.  PSYCHIATRY: No anxiety or depression.   MEDICATIONS AT HOME:  Prior to Admission medications   Medication Sig Start Date End Date Taking? Authorizing Provider  Alpha-Lipoic Acid 200 MG CAPS Take 1 capsule by mouth daily.   Yes [provider]  atorvastatin (LIPITOR) 80 MG tablet Take 1 tablet (80 mg total) by mouth  daily. 02/16/17  Yes Minna Merritts, MD  CALCIUM CITRATE PO Take 1 tablet by mouth daily. Takes 300mg  daily.   Yes [provider]  carvedilol (COREG) 6.25 MG tablet Take 1 tablet (6.25 mg total) by mouth 2 (two) times daily. 03/20/17  Yes Minna Merritts, MD  Cholecalciferol (VITAMIN D) 2000 units CAPS Take 4,000 Units by mouth daily.   Yes [provider]  Coenzyme Q10 (CO Q10) 200 MG CAPS Take 1 capsule by mouth daily.   Yes [provider]  CRANBERRY EXTRACT PO Take 1 tablet by mouth daily. Takes 1500mg  daily   Yes [provider]  furosemide (LASIX) 40 MG tablet Take 40 mg by mouth daily.   Yes [provider]  isosorbide mononitrate (IMDUR) 30 MG 24 hr tablet Take 2 tablets (60 mg total) by mouth daily. 02/15/17 08/14/17 Yes Cheryln Manly, NP  latanoprost (XALATAN) 0.005 % ophthalmic solution Place 1 drop into both eyes at bedtime. In morning 10/27/15  Yes [provider]  levothyroxine (SYNTHROID, LEVOTHROID) 75 MCG tablet Take 1 tablet (75 mcg total) by mouth daily. 07/21/16  Yes [name], MD  Multiple Vitamin (MULTIVITAMIN) tablet Take 1 tablet by mouth daily.   Yes [provider]  NIFEdipine (PROCARDIA-XL/ADALAT CC) 60 MG 24 hr tablet Take 1 tablet (60 mg total) by mouth 2 (two) times daily. 03/20/17  Yes Gollan, Kathlene November, MD  nitroGLYCERIN (NITROSTAT) 0.4 MG SL tablet Place 1 tablet (0.4 mg total) under the tongue every 5 (five) minutes as needed for chest pain. 07/25/16 04/30/17 Yes Gollan, Kathlene November, MD  Omega 3-6-9 Fatty Acids (OMEGA-3-6-9 PO) Take 1 capsule by mouth 2 (two) times daily.   Yes [provider]  Rivaroxaban (XARELTO) 15 MG TABS tablet Take 1 tablet (15 mg total) by mouth daily with supper. 10/27/16  Yes Gollan, Kathlene November, MD  timolol (TIMOPTIC) 0.25 % ophthalmic solution Place 1 drop into both eyes 2 (two) times daily. am 08/14/15  Yes [provider]  traMADol (ULTRAM) 50 MG  tablet Take 2 tablets (100 mg total) by mouth 3 (three) times daily as needed. 03/01/17  Yes [name], MD  valsartan (DIOVAN) 80 MG tablet Take 1 tablet (80 mg total) by mouth 2 (two) times daily. 02/16/17  Yes Minna Merritts, MD  ezetimibe (ZETIA) 10 MG tablet Take 1 tablet (10 mg total) by mouth daily. 02/16/17   Minna Merritts, MD      PHYSICAL EXAMINATION:   VITAL SIGNS: Blood pressure 98/64, pulse 62, temperature 97.8 F (36.6 C), temperature source Oral, resp. rate 19, height 5\' 3"  (1.6 m), weight 66.7 kg (147 lb), SpO2 92 %.  GENERAL:  84 y.o.-year-old patient lying in the bed with no acute distress.  Frail-appearing EYES: Pupils equal, round, reactive to light and accommodation. No scleral icterus. Extraocular muscles intact.  HEENT: Head atraumatic, normocephalic. Oropharynx and nasopharynx clear.  NECK:  Supple, no jugular venous distention. No thyroid enlargement, no tenderness.  LUNGS: Normal breath sounds bilaterally, no wheezing, rales,rhonchi or crepitation. No use of accessory muscles of respiration.  CARDIOVASCULAR: S1, S2 normal. + SEM, no rubs, or gallops.  ABDOMEN: Soft, nontender, nondistended. Bowel sounds present. No organomegaly or mass.  EXTREMITIES: Marked bilateral lower extremity pitting edema, cyanosis, or clubbing.  NEUROLOGIC: Cranial nerves II through XII are intact. MAES. Gait not checked.  PSYCHIATRIC: The patient is alert and oriented x 3.  SKIN: No obvious rash, lesion, or ulcer.   LABORATORY PANEL:   CBC Recent Labs  Lab 04/30/17 1447  WBC 9.9  HGB 11.0*  HCT 33.2*  PLT 264  MCV 91.9  MCH 30.4  MCHC 33.0  RDW 15.3*   ------------------------------------------------------------------------------------------------------------------  Chemistries  Recent Labs  Lab 04/30/17 1447  NA 134*  K 5.0  CL 102  CO2 17*  GLUCOSE 163*  BUN 94*  CREATININE 3.73*  CALCIUM 8.5*    ------------------------------------------------------------------------------------------------------------------ estimated creatinine clearance is 11.4 mL/min (A) (by C-G formula based on SCr of 3.73 mg/dL (H)). ------------------------------------------------------------------------------------------------------------------ No results for input(s): TSH, T4TOTAL, T3FREE, THYROIDAB in the last 72 hours.  Invalid input(s): FREET3   Coagulation profile No results for input(s): INR, PROTIME in the last 168 hours. ------------------------------------------------------------------------------------------------------------------- No results for input(s): DDIMER in the last 72 hours. -------------------------------------------------------------------------------------------------------------------  Cardiac Enzymes Recent Labs  Lab 04/30/17 1447  TROPONINI 9.24*   ------------------------------------------------------------------------------------------------------------------ Invalid input(s): POCBNP  ---------------------------------------------------------------------------------------------------------------  Urinalysis No results found for: COLORURINE, APPEARANCEUR, LABSPEC, PHURINE, GLUCOSEU, HGBUR, BILIRUBINUR, KETONESUR, PROTEINUR, UROBILINOGEN, NITRITE, LEUKOCYTESUR   RADIOLOGY: Dg Chest 2 View  Result Date: 04/30/2017 CLINICAL DATA:  Shortness of breath. History of coronary artery disease and CABG, pulmonary hypertension, chronic renal insufficiency, peripheral vascular disease, former smoker. EXAM: CHEST  2 VIEW COMPARISON:  PA and lateral chest x-ray of Jul 22, 2016 FINDINGS: The lungs are adequately inflated. There is a moderate-sized right pleural effusion and small left pleural effusion. The cardiac silhouette is mildly enlarged. The central pulmonary vascularity is engorged. There are post median sternotomy changes. There is calcification in the wall of the aortic arch  and descending thoracic aorta. The observed bony thorax exhibits no acute abnormality. The patient has undergone previous posterior fusion in the upper lumbar spine. IMPRESSION: CHF with mild interstitial edema and bilateral pleural effusions new since the previous study. There is underlying chronic bronchitic change. Thoracic aortic atherosclerosis. Electronically Signed   By: David  Martinique M.D.   On: 04/30/2017 14:56   US Venous Img Lower Bilateral  Result Date: 04/30/2017 CLINICAL DATA:  Asymmetric leg swelling, worse on the right EXAM: BILATERAL LOWER EXTREMITY VENOUS DOPPLER ULTRASOUND TECHNIQUE: Gray-scale sonography with graded compression, as well as color Doppler and duplex ultrasound were performed to evaluate the lower extremity deep venous systems from the level of the common femoral vein and including the common femoral, femoral, profunda femoral, popliteal and calf veins including the posterior tibial, peroneal and gastrocnemius veins when visible. The superficial great saphenous vein was also interrogated. Spectral Doppler was utilized to evaluate flow at rest and with distal augmentation maneuvers in the common femoral, femoral and popliteal veins. COMPARISON:  None. FINDINGS: RIGHT LOWER EXTREMITY Common Femoral Vein: No evidence of thrombus. Normal compressibility, respiratory phasicity and response to augmentation. Saphenofemoral Junction: No evidence of thrombus. Normal compressibility and flow on color Doppler imaging. Profunda Femoral Vein: No evidence of thrombus. Normal compressibility and flow on color Doppler imaging. Femoral Vein: No evidence of thrombus. Normal compressibility, respiratory phasicity and response to augmentation. Popliteal Vein: No evidence of  thrombus. Normal compressibility, respiratory phasicity and response to augmentation. Calf Veins: No evidence of thrombus. Normal compressibility and flow on color Doppler imaging. Superficial Great Saphenous Vein: No evidence of  thrombus. Normal compressibility. Venous Reflux:  None. Other Findings:  None. LEFT LOWER EXTREMITY Common Femoral Vein: No evidence of thrombus. Normal compressibility, respiratory phasicity and response to augmentation. Saphenofemoral Junction: No evidence of thrombus. Normal compressibility and flow on color Doppler imaging. Profunda Femoral Vein: No evidence of thrombus. Normal compressibility and flow on color Doppler imaging. Femoral Vein: No evidence of thrombus. Normal compressibility, respiratory phasicity and response to augmentation. Popliteal Vein: No evidence of thrombus. Normal compressibility, respiratory phasicity and response to augmentation. Calf Veins: No evidence of thrombus. Normal compressibility and flow on color Doppler imaging. Superficial Great Saphenous Vein: No evidence of thrombus. Normal compressibility. Venous Reflux:  None. Other Findings:  None. IMPRESSION: No evidence of deep venous thrombosis. Electronically Signed   By: Jerilynn Mages.  Shick M.D.   On: 04/30/2017 15:54    EKG: Orders placed or performed during the hospital encounter of 04/30/17  . ED EKG within 10 minutes  . ED EKG within 10 minutes  . EKG 12-Lead  . EKG 12-Lead    IMPRESSION AND PLAN: 1 acute non-STEMI w/ severe CAD Placed on our ACS protocol, heparin drip, beta-blocker therapy, nitrates as needed, supplemental oxygen as needed, IV morphine as needed breakthrough pain, aspirin, statin therapy-check lipids in the morning, cardiology to see for expert opinion, patient was on Xarelto for DVT, Diovan discontinued given acute renal failure Noted echocardiogram November 2018 ejection fraction 55-60% with stage II diastolic dysfunction Heart cath December 2018 noted for severe three-vessel disease with occluded LAD  2 acute on chronic diastolic congestive heart failure exacerbation IV Lasix twice daily, beta-blocker therapy, aspirin, on heparin drip, strict I&O monitoring, daily weights, statin therapy-check  lipids in the morning, Diovan discontinued given acute renal failure  3 acute renal failure/acute kidney injury Discontinue Diovan, avoid nephrotoxic agents, check renal ultrasound, BMP daily, nephrology to see, strict I&O monitoring with daily weights  4 chronic hypothyroidism, unspecified Stable Continue Synthroid   All the records are reviewed and case discussed with ED provider. Management plans discussed with the patient, family and they are in agreement.  CODE STATUS:full Code Status History    Date Active Date Inactive Code Status Order ID Comments User Context   02/14/2017 16:45 02/15/2017 15:07 Full Code [id number]  Wellington Hampshire, MD Inpatient       TOTAL TIME TAKING CARE OF THIS PATIENT: 45 minutes.    Avel Peace Chinyere Galiano M.D on 04/30/2017   Between 7am to 6pm - Pager - [phone]  After 6pm go to [email or web address] - password [email or web address] [facility]  Office  [phone]  CC: Primary care physician; [name], MD   Note: This dictation was prepared with Dragon dictation along with smaller phrase technology. Any transcriptional errors that result from this process are unintentional.

## 2017-04-30 NOTE — Progress Notes (Addendum)
PHARMACIST - PHYSICIAN ORDER COMMUNICATION  CONCERNING: P&T Medication Policy on Herbal Medications  DESCRIPTION:  This patient's orders for:  TFT73 and alpha lipolic acid has been noted.  This product(s) is classified as an "herbal" or natural product. Due to a lack of definitive safety studies or FDA approval, nonstandard manufacturing practices, plus the potential risk of unknown drug-drug interactions while on inpatient medications, the Pharmacy and Therapeutics Committee does not permit the use of "herbal" or natural products of this type within [facility].   ACTION TAKEN: The pharmacy department is unable to verify this order at this time. Please reevaluate patient's clinical condition at discharge and address if the herbal or natural product(s) should be resumed at that time.  Ulice Dash, PharmD Clinical Pharmacist

## 2017-04-30 NOTE — ED Notes (Signed)
Date and time results received: 04/30/17 1528   Test: Trop Critical Value: 9.24  Name of Provider Notified: Ivar Bury

## 2017-04-30 NOTE — Telephone Encounter (Signed)
Patient experiencing increased shortness of breath. Patient agreeable to come in a see Christell Faith, PA at 1:30 pm today.

## 2017-04-30 NOTE — ED Provider Notes (Signed)
[facility] Emergency Department Provider Note  ____________________________________________  Time seen: Approximately 3:07 PM  I have reviewed the triage vital signs and the nursing notes.   HISTORY  Chief Complaint Shortness of Breath   HPI Dennis Burns is a 84 y.o. 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 Patient reports over the last month he has been having progressively worsening dyspnea on exertion which has become markedly worse 4 days ago. SOB is now constant and severe with laying flat or minimal exertion such as putting shoes on.  He endorses a 10 pound weight gain in the last month, progressively worsening bilateral lower extremity edema, and orthopnea. Has been unable to sleep the last few nights due to severe shortness of breath when he lays flat. Has had intermittent episodes of chest tightness over the course of the last week which responded to nitroglycerin. No chest tightness today. No recent URI symptoms, no fever or chills. Patient endorses compliance with his medications at home and is currently on Lasix 40 mg. He went to see his cardiologist today and was found to be satting 85% on room air.  Past Medical History:  Diagnosis Date  . Allergic rhinitis due to pollen    as child--better now  . Chronic kidney disease, stage III (moderate) (HCC)   . Coronary artery disease   . DVT, lower extremity, recurrent ([place])   . Glaucoma    [facility]   . History of prostate cancer 2004  . Hyperlipidemia   . Hypertension   . Hypothyroidism   . Impaired fasting glucose   . Spinal stenosis of lumbar region with radiculopathy     Patient Active Problem List   Diagnosis Date Noted  . Effort angina ([place]) 02/14/2017  . Positive cardiac stress test 02/04/2017  . Preventative health care 05/03/2016  . Pulmonary  hypertension ([place]) 05/03/2016  . PAD (peripheral artery disease) ([place]) 04/18/2016  . DOE (dyspnea on exertion) 11/02/2015  . Spinal stenosis of lumbar region with radiculopathy   . Coronary artery disease   . History of prostate cancer   . Hyperlipidemia   . Hypertension   . Chronic kidney disease, stage III (moderate) (HCC)   . DVT, lower extremity, recurrent ([place])   . Glaucoma   . Hypothyroidism   . Impaired fasting glucose     Past Surgical History:  Procedure Laterality Date  . CAROTID ENDARTERECTOMY Right 09/2004   [place] GRAFT  2003   [place]   after fall  . INSERTION PROSTATE RADIATION SEED  2004   and RT  . POSTERIOR LUMBAR FUSION  2012, 2014  . RIGHT/LEFT HEART CATH AND CORONARY ANGIOGRAPHY N/A 02/14/2017   Procedure: RIGHT/LEFT HEART CATH AND CORONARY ANGIOGRAPHY;  Surgeon: Wellington Hampshire, MD;  Location: [facility] [facility];  Service: Cardiovascular;  Laterality: N/A;    Prior to Admission medications   Medication Sig Start Date End Date Taking? Authorizing Provider  Alpha-Lipoic Acid 200 MG CAPS Take 1 capsule by mouth daily.   Yes [provider]  atorvastatin (LIPITOR) 80 MG tablet Take 1 tablet (80 mg total) by mouth daily. 02/16/17  Yes Minna Merritts, MD  CALCIUM CITRATE PO Take 1 tablet by mouth daily. Takes 300mg  daily.   Yes [provider]  carvedilol (COREG) 6.25 MG  tablet Take 1 tablet (6.25 mg total) by mouth 2 (two) times daily. 03/20/17  Yes Minna Merritts, MD  Cholecalciferol (VITAMIN D) 2000 units CAPS Take 4,000 Units by mouth daily.   Yes [provider]  Coenzyme Q10 (CO Q10) 200 MG CAPS Take 1 capsule by mouth daily.   Yes [provider]  CRANBERRY EXTRACT PO Take 1 tablet by mouth daily. Takes 1500mg  daily   Yes [provider]  ezetimibe (ZETIA) 10 MG tablet Take 1 tablet (10 mg total) by mouth daily. 02/16/17  Yes Gollan,  Kathlene November, MD  furosemide (LASIX) 40 MG tablet Take 40 mg by mouth daily.   Yes [provider]  isosorbide mononitrate (IMDUR) 30 MG 24 hr tablet Take 2 tablets (60 mg total) by mouth daily. 02/15/17 08/14/17 Yes Cheryln Manly, NP  latanoprost (XALATAN) 0.005 % ophthalmic solution Place 1 drop into both eyes at bedtime. In morning 10/27/15  Yes [provider]  levothyroxine (SYNTHROID, LEVOTHROID) 75 MCG tablet Take 1 tablet (75 mcg total) by mouth daily. 07/21/16  Yes [name], MD  Multiple Vitamin (MULTIVITAMIN) tablet Take 1 tablet by mouth daily.   Yes [provider]  NIFEdipine (PROCARDIA-XL/ADALAT CC) 60 MG 24 hr tablet Take 1 tablet (60 mg total) by mouth 2 (two) times daily. 03/20/17  Yes Gollan, Kathlene November, MD  nitroGLYCERIN (NITROSTAT) 0.4 MG SL tablet Place 1 tablet (0.4 mg total) under the tongue every 5 (five) minutes as needed for chest pain. 07/25/16 04/30/17 Yes Gollan, Kathlene November, MD  Omega 3-6-9 Fatty Acids (OMEGA-3-6-9 PO) Take 1 capsule by mouth 2 (two) times daily.   Yes [provider]  Rivaroxaban (XARELTO) 15 MG TABS tablet Take 1 tablet (15 mg total) by mouth daily with supper. 10/27/16  Yes Gollan, Kathlene November, MD  timolol (TIMOPTIC) 0.25 % ophthalmic solution Place 1 drop into both eyes 2 (two) times daily. am 08/14/15  Yes [provider]  traMADol (ULTRAM) 50 MG tablet Take 2 tablets (100 mg total) by mouth 3 (three) times daily as needed. 03/01/17  Yes [name], MD  valsartan (DIOVAN) 80 MG tablet Take 1 tablet (80 mg total) by mouth 2 (two) times daily. 02/16/17  Yes Minna Merritts, MD    Allergies Patient has no known allergies.  Family History  Problem Relation Age of Onset  . Cancer Mother        colon (cause of death) and breast  . Glaucoma Mother   . Cancer Father        colon  . Stroke Maternal Grandfather   . Diabetes Neg Hx     Social History Social History   Tobacco Use  . Smoking  status: Former Smoker    Last attempt to quit: 1978    Years since quitting: 41.1  . Smokeless tobacco: Never Used  Substance Use Topics  . Alcohol use: Yes  . Drug use: No    Review of Systems  Constitutional: Negative for fever. + weight gain Eyes: Negative for visual changes. ENT: Negative for sore throat. Neck: No neck pain  Cardiovascular: + chest pain. Respiratory: + shortness of breath. Gastrointestinal: Negative for abdominal pain, vomiting or diarrhea. Genitourinary: Negative for dysuria. Musculoskeletal: Negative for back pain. + b/l leg swelling  Skin: Negative for rash. Neurological: Negative for headaches, weakness or numbness. Psych: No SI or HI  ____________________________________________   PHYSICAL EXAM:  VITAL SIGNS: ED Triage Vitals  Enc Vitals Group  BP 04/30/17 1423 (!) 91/40     Pulse Rate 04/30/17 1421 72     Resp 04/30/17 1421 16     Temp 04/30/17 1421 97.8 F (36.6 C)     Temp Source 04/30/17 1421 Oral     SpO2 04/30/17 1423 (!) 88 %     Weight 04/30/17 1419 147 lb (66.7 kg)     Height 04/30/17 1419 5\' 3"  (1.6 m)     Head Circumference --      Peak Flow --      Pain Score --      Pain Loc --      Pain Edu? --      Excl. in [place]? --     Constitutional: Alert and oriented. Well appearing and in no apparent distress. HEENT:      Head: Normocephalic and atraumatic.         Eyes: Conjunctivae are normal. Sclera is non-icteric.       Mouth/Throat: Mucous membranes are moist.       Neck: Supple with no signs of meningismus. Cardiovascular: Regular rate and rhythm. No murmurs, gallops, or rubs. 2+ symmetrical distal pulses are present in all extremities. Elevated JVD to the angle of the mandible Respiratory: Normal respiratory effort. Lungs are clear to auscultation bilaterally. No wheezes, crackles, or rhonchi. Hypoxic on RA sating 89-91% on 2 L [place] Gastrointestinal: Soft, non tender, and non distended with positive bowel sounds. No rebound  or guarding. Musculoskeletal: 2+ pitting edema R>L Neurologic: Normal speech and language. Face is symmetric. Moving all extremities. No gross focal neurologic deficits are appreciated. Skin: Skin is warm, dry and intact. No rash noted. Psychiatric: Mood and affect are normal. Speech and behavior are normal.  ____________________________________________   LABS (all labs ordered are listed, but only abnormal results are displayed)  Labs Reviewed  BASIC METABOLIC PANEL - Abnormal; Notable for the following components:      Result Value   Sodium 134 (*)    CO2 17 (*)    Glucose, Bld 163 (*)    BUN 94 (*)    Creatinine, Ser 3.73 (*)    Calcium 8.5 (*)    GFR calc non Af Amer 13 (*)    GFR calc Af Amer 16 (*)    All other components within normal limits  CBC - Abnormal; Notable for the following components:   RBC 3.62 (*)    Hemoglobin 11.0 (*)    HCT 33.2 (*)    RDW 15.3 (*)    All other components within normal limits  TROPONIN I - Abnormal; Notable for the following components:   Troponin I 9.24 (*)    All other components within normal limits  BRAIN NATRIURETIC PEPTIDE   ____________________________________________  EKG  ED ECG REPORT I, Rudene Re, the attending physician, personally viewed and interpreted this ECG.  Normal sinus rhythm, rate of 72, normal intervals, normal axis, ST depressions on lateral leads with 1 mm elevation in aVR. These depressions are new when compared to prior. ____________________________________________  RADIOLOGY  I have personally reviewed the images performed during this visit and I agree with the Radiologist's read.   Interpretation by Radiologist:  Dg Chest 2 View  Result Date: 04/30/2017 CLINICAL DATA:  Shortness of breath. History of coronary artery disease and CABG, pulmonary hypertension, chronic renal insufficiency, peripheral vascular disease, former smoker. EXAM: CHEST  2 VIEW COMPARISON:  PA and lateral chest x-ray  of Jul 22, 2016 FINDINGS: The lungs are adequately  inflated. There is a moderate-sized right pleural effusion and small left pleural effusion. The cardiac silhouette is mildly enlarged. The central pulmonary vascularity is engorged. There are post median sternotomy changes. There is calcification in the wall of the aortic arch and descending thoracic aorta. The observed bony thorax exhibits no acute abnormality. The patient has undergone previous posterior fusion in the upper lumbar spine. IMPRESSION: CHF with mild interstitial edema and bilateral pleural effusions new since the previous study. There is underlying chronic bronchitic change. Thoracic aortic atherosclerosis. Electronically Signed   By: David  Martinique M.D.   On: 04/30/2017 14:56   US Venous Img Lower Bilateral  Result Date: 04/30/2017 CLINICAL DATA:  Asymmetric leg swelling, worse on the right EXAM: BILATERAL LOWER EXTREMITY VENOUS DOPPLER ULTRASOUND TECHNIQUE: Gray-scale sonography with graded compression, as well as color Doppler and duplex ultrasound were performed to evaluate the lower extremity deep venous systems from the level of the common femoral vein and including the common femoral, femoral, profunda femoral, popliteal and calf veins including the posterior tibial, peroneal and gastrocnemius veins when visible. The superficial great saphenous vein was also interrogated. Spectral Doppler was utilized to evaluate flow at rest and with distal augmentation maneuvers in the common femoral, femoral and popliteal veins. COMPARISON:  None. FINDINGS: RIGHT LOWER EXTREMITY Common Femoral Vein: No evidence of thrombus. Normal compressibility, respiratory phasicity and response to augmentation. Saphenofemoral Junction: No evidence of thrombus. Normal compressibility and flow on color Doppler imaging. Profunda Femoral Vein: No evidence of thrombus. Normal compressibility and flow on color Doppler imaging. Femoral Vein: No evidence of thrombus. Normal  compressibility, respiratory phasicity and response to augmentation. Popliteal Vein: No evidence of thrombus. Normal compressibility, respiratory phasicity and response to augmentation. Calf Veins: No evidence of thrombus. Normal compressibility and flow on color Doppler imaging. Superficial Great Saphenous Vein: No evidence of thrombus. Normal compressibility. Venous Reflux:  None. Other Findings:  None. LEFT LOWER EXTREMITY Common Femoral Vein: No evidence of thrombus. Normal compressibility, respiratory phasicity and response to augmentation. Saphenofemoral Junction: No evidence of thrombus. Normal compressibility and flow on color Doppler imaging. Profunda Femoral Vein: No evidence of thrombus. Normal compressibility and flow on color Doppler imaging. Femoral Vein: No evidence of thrombus. Normal compressibility, respiratory phasicity and response to augmentation. Popliteal Vein: No evidence of thrombus. Normal compressibility, respiratory phasicity and response to augmentation. Calf Veins: No evidence of thrombus. Normal compressibility and flow on color Doppler imaging. Superficial Great Saphenous Vein: No evidence of thrombus. Normal compressibility. Venous Reflux:  None. Other Findings:  None. IMPRESSION: No evidence of deep venous thrombosis. Electronically Signed   By: Jerilynn Mages.  Shick M.D.   On: 04/30/2017 15:54    ____________________________________________   PROCEDURES  Procedure(s) performed: None Procedures Critical Care performed: yes  CRITICAL CARE Performed by: Rudene Re  ?  Total critical care time: 45 min  Critical care time was exclusive of separately billable procedures and treating other patients.  Critical care was necessary to treat or prevent imminent or life-threatening deterioration.  Critical care was time spent personally by me on the following activities: development of treatment plan with patient and/or surrogate as well as nursing, discussions with  consultants, evaluation of patient's response to treatment, examination of patient, obtaining history from patient or surrogate, ordering and performing treatments and interventions, ordering and review of laboratory studies, ordering and review of radiographic studies, pulse oximetry and re-evaluation of patient's condition.  ____________________________________________   INITIAL IMPRESSION / ASSESSMENT AND PLAN / ED COURSE  84 y.o. 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 Patient looks volume overloaded on exam with elevated JVD, no crackles, new oxygen requirement and CXR concerning for cardiomegaly, pleural effusion, and pulmonary edema. Will start patient on IV Lasix 40 mg. Labs are pending. Doppler b/l LE pending to rule out DVT since patient ha asymmetric edema. Presentation concerning for CHF exacerbation.   _________________________ 3:42 PM on 04/30/2017 -----------------------------------------  Troponin is elevated at 9.24. Unfortunately patient was taken to Doppler studies without an EKG despite my request x 2 for an EKG to be done. I sent a RN to [place] to get an EKG which shows ST depressions in lateral leads with 55mm STE on aVR. Patient has no SOB at rest and no CP. EKG with no evidence of STEMI but does show ischemic changes. Dr. Fletcher Anon has been paged. Patient will be given ASA. Labs consistent with acute on chronic kidney disease and NSTEMI.     _________________________ 4:01 PM on 04/30/2017 -----------------------------------------   Spoke with Dr. Fletcher Anon who agrees patient does not meet STEMI criteria and recommended starting patient on heparin which has been ordered. Patient updated of all findings and critical condition. He is requiring more  oxygen and is currently on 5L [place] therefore will start patient on BiPAP. Hospitalist has been consulted for admission to stepdown.   As part of my medical decision making, I reviewed the following data within the [place] History obtained from family, Nursing notes reviewed and incorporated, Labs reviewed , EKG interpreted , Old EKG reviewed, Old chart reviewed, Radiograph reviewed , Discussed with admitting physician , Notes from prior ED visits and [place] Controlled Substance Database    Pertinent labs & imaging results that were available during my care of the patient were reviewed by me and considered in my medical decision making (see chart for details).    ____________________________________________   FINAL CLINICAL IMPRESSION(S) / ED DIAGNOSES  Final diagnoses:  Acute respiratory failure with hypoxia (HCC)  Acute on chronic congestive heart failure, unspecified heart failure type (HCC)  NSTEMI (non-ST elevated myocardial infarction) ([place])  Acute kidney injury superimposed on chronic kidney disease ([place])      NEW MEDICATIONS STARTED DURING THIS VISIT:  ED Discharge Orders    None       Note:  This document was prepared using Dragon voice recognition software and may include unintentional dictation errors.    Alfred Levins, [place], MD 04/30/17 [phone]

## 2017-04-30 NOTE — ED Notes (Signed)
Pt is currently on 4L via nasal canula with O2 sats of 87%; Dr. Alfred Levins placing orders for Bipap.

## 2017-05-01 ENCOUNTER — Inpatient Hospital Stay (HOSPITAL_COMMUNITY)
Admit: 2017-05-01 | Discharge: 2017-05-01 | Disposition: A | Discharge status: Home / Self Care | Payer: Medicare PPO | Attending: Cardiovascular Disease | Admitting: Cardiovascular Disease

## 2017-05-01 ENCOUNTER — Inpatient Hospital Stay: Payer: Medicare PPO

## 2017-05-01 DIAGNOSIS — N179 Acute kidney failure, unspecified: Secondary | ICD-10-CM

## 2017-05-01 DIAGNOSIS — I361 Nonrheumatic tricuspid (valve) insufficiency: Secondary | ICD-10-CM

## 2017-05-01 DIAGNOSIS — I509 Heart failure, unspecified: Secondary | ICD-10-CM

## 2017-05-01 LAB — BLOOD GAS, ARTERIAL
Acid-base deficit: 3.2 mmol/L — ABNORMAL HIGH (ref 0.0–2.0)
Bicarbonate: 19.7 mmol/L — ABNORMAL LOW (ref 20.0–28.0)
FIO2: 1
O2 Saturation: 92.3 %
PATIENT TEMPERATURE: 37
PH ART: 7.44 (ref 7.350–7.450)
pCO2 arterial: 29 mmHg — ABNORMAL LOW (ref 32.0–48.0)
pO2, Arterial: 62 mmHg — ABNORMAL LOW (ref 83.0–108.0)

## 2017-05-01 LAB — BASIC METABOLIC PANEL
ANION GAP: 12 (ref 5–15)
BUN: 106 mg/dL — AB (ref 6–20)
CALCIUM: 8.2 mg/dL — AB (ref 8.9–10.3)
CO2: 20 mmol/L — ABNORMAL LOW (ref 22–32)
Chloride: 103 mmol/L (ref 101–111)
Creatinine, Ser: 3.7 mg/dL — ABNORMAL HIGH (ref 0.61–1.24)
GFR calc Af Amer: 16 mL/min — ABNORMAL LOW (ref >60)
GFR, EST NON AFRICAN AMERICAN: 14 mL/min — AB (ref >60)
GLUCOSE: 129 mg/dL — AB (ref 65–99)
Potassium: 4.7 mmol/L (ref 3.5–5.1)
Sodium: 135 mmol/L (ref 135–145)

## 2017-05-01 LAB — CBC
HCT: 30.8 % — ABNORMAL LOW (ref 40.0–52.0)
HEMOGLOBIN: 10.4 g/dL — AB (ref 13.0–18.0)
MCH: 30.9 pg (ref 26.0–34.0)
MCHC: 33.7 g/dL (ref 32.0–36.0)
MCV: 91.6 fL (ref 80.0–100.0)
Platelets: 234 10*3/uL (ref 150–440)
RBC: 3.36 MIL/uL — ABNORMAL LOW (ref 4.40–5.90)
RDW: 15.1 % — AB (ref 11.5–14.5)
WBC: 7.4 10*3/uL (ref 3.8–10.6)

## 2017-05-01 LAB — HEPARIN LEVEL (UNFRACTIONATED)
HEPARIN UNFRACTIONATED: 1.4 [IU]/mL — AB (ref 0.30–0.70)
Heparin Unfractionated: 1.44 IU/mL — ABNORMAL HIGH (ref 0.30–0.70)

## 2017-05-01 LAB — ECHOCARDIOGRAM COMPLETE
HEIGHTINCHES: 63 in
Weight: 2288 oz

## 2017-05-01 LAB — TROPONIN I
TROPONIN I: 10.6 ng/mL — AB (ref <0.03)
TROPONIN I: 6.92 ng/mL — AB (ref <0.03)

## 2017-05-01 LAB — LIPID PANEL
CHOLESTEROL: 114 mg/dL (ref 0–200)
HDL: 34 mg/dL — ABNORMAL LOW (ref >40)
LDL Cholesterol: 58 mg/dL (ref 0–99)
Total CHOL/HDL Ratio: 3.4 RATIO
Triglycerides: 109 mg/dL (ref <150)
VLDL: 22 mg/dL (ref 0–40)

## 2017-05-01 LAB — APTT
APTT: 50 s — AB (ref 24–36)
aPTT: 60 seconds — ABNORMAL HIGH (ref 24–36)
aPTT: 66 seconds — ABNORMAL HIGH (ref 24–36)

## 2017-05-01 MED ORDER — FUROSEMIDE 10 MG/ML IJ SOLN
40.0000 mg | Freq: Two times a day (BID) | INTRAMUSCULAR | Status: DC
  Administered 2017-05-02: 40 mg via INTRAVENOUS
  Filled 2017-05-01: qty 4

## 2017-05-01 MED ORDER — FUROSEMIDE 10 MG/ML IJ SOLN
40.0000 mg | Freq: Once | INTRAMUSCULAR | Status: AC
  Administered 2017-05-01: 40 mg via INTRAVENOUS
  Filled 2017-05-01: qty 4

## 2017-05-01 MED ORDER — NIFEDIPINE ER 30 MG PO TB24
30.0000 mg | ORAL_TABLET | Freq: Two times a day (BID) | ORAL | Status: DC
  Administered 2017-05-01 – 2017-05-03 (×3): 30 mg via ORAL
  Filled 2017-05-01 (×8): qty 1

## 2017-05-01 MED ORDER — HEPARIN BOLUS VIA INFUSION
1000.0000 [IU] | Freq: Once | INTRAVENOUS | Status: AC
  Administered 2017-05-01: 1000 [IU] via INTRAVENOUS
  Filled 2017-05-01: qty 1000

## 2017-05-01 MED ORDER — PHENOL 1.4 % MT LIQD
1.0000 | OROMUCOSAL | Status: DC | PRN
  Administered 2017-05-03: 1 via OROMUCOSAL
  Filled 2017-05-01 (×2): qty 177

## 2017-05-01 MED ORDER — METHYLPREDNISOLONE SODIUM SUCC 125 MG IJ SOLR
60.0000 mg | Freq: Once | INTRAMUSCULAR | Status: AC
  Administered 2017-05-01: 60 mg via INTRAVENOUS
  Filled 2017-05-01: qty 2

## 2017-05-01 NOTE — Progress Notes (Signed)
ANTICOAGULATION CONSULT NOTE - Initial Consult  Pharmacy Consult for heparin drip Indication: chest pain/ACS  No Known Allergies  Patient Measurements: Height: 5\' 3"  (160 cm) Weight: 143 lb (64.9 kg) IBW/kg (Calculated) : 56.9 Heparin Dosing Weight: 67 kg  Vital Signs: Temp: 97.6 F (36.4 C) (02/26 0255) Temp Source: Oral (02/26 0255) BP: 102/50 (02/26 0255) Pulse Rate: 77 (02/26 0255)  Labs: Recent Labs    04/30/17 1447 04/30/17 1741 04/30/17 2108 05/01/17 0219  HGB 11.0*  --   --   --   HCT 33.2*  --   --   --   PLT 264  --   --   --   APTT  --  48*  --  50*  LABPROT  --  18.0*  --   --   INR  --  1.50  --   --   HEPARINUNFRC  --  2.04*  --  1.44*  CREATININE 3.73*  --   --   --   TROPONINI 9.24*  --  12.08*  --     Estimated Creatinine Clearance: 11.4 mL/min (A) (by C-G formula based on SCr of 3.73 mg/dL (H)).   Medical History: Past Medical History:  Diagnosis Date  . Allergic rhinitis due to pollen    as child--better now  . Chronic kidney disease, stage III (moderate) (HCC)   . Coronary artery disease   . DVT, lower extremity, recurrent ([place])   . Glaucoma    [facility]   . History of prostate cancer 2004  . Hyperlipidemia   . Hypertension   . Hypothyroidism   . Impaired fasting glucose   . Spinal stenosis of lumbar region with radiculopathy     Assessment: Pharmacy consulted to dose and monitor heparin drip in this 84 year old man for ACS.  Patient was taking rivaroxaban PTA and reports last dose taken on 2/24 around 2000.   Baseline labs ordered including baseline heparin level. If baseline heparin level is elevated, will need to dose/monitor using  APTT until levels correlate.  Goal of Therapy:  APTT 66-102 seconds Heparin level 0.3-0.7 units/ml Monitor platelets by anticoagulation protocol: Yes   Plan:  No bolus as patient is currently anticoagulated on rivaroxaban Will start heparin 800 units/hr 24 hours after last rivaroxaban dose  per protocol Will check HL and APTT in 8 hours and CBC daily.  02/26 @ 0200 HL 1.44, aPTT 50, HL supratherapeutic (but trending down), aPTT subtherapeutic. Both not correlating, will rebolus w/ heparin 1000 units IV x 1 and increase rate to 900 units/hr. Will recheck both HL/aPTT @ 1000, will continue to dose off of aPTT until both HL/aPTT correlate then will go back to dosing off of HL.   Tobie Lords, PharmD, BCPS Clinical Pharmacist 05/01/2017,4:24 AM

## 2017-05-01 NOTE — Progress Notes (Signed)
[facility] NAME: Dennis Burns    MR#:  [id number]  DATE OF BIRTH:  05-17-1928  SUBJECTIVE:   SOB this am but improved from yesterday denies chest pain  REVIEW OF SYSTEMS:    Review of Systems  Constitutional: Negative for fever, chills weight loss HENT: Negative for ear pain, nosebleeds, congestion, facial swelling, rhinorrhea, neck pain, neck stiffness and ear discharge.   Respiratory: Negative for cough, ++shortness of breath, NO wheezing  Cardiovascular: Negative for chest pain, palpitations and leg swelling.  Gastrointestinal: Negative for heartburn, abdominal pain, vomiting, diarrhea or consitpation Genitourinary: Negative for dysuria, urgency, frequency, hematuria Musculoskeletal: Negative for back pain or joint pain Neurological: Negative for dizziness, seizures, syncope, focal weakness,  numbness and headaches.  Hematological: Does not bruise/bleed easily.  Psychiatric/Behavioral: Negative for hallucinations, confusion, dysphoric mood    Tolerating Diet: yes      DRUG ALLERGIES:  No Known Allergies  VITALS:  Blood pressure (!) 104/50, pulse 84, temperature 98.2 F (36.8 C), resp. rate 18, height 5\' 3"  (1.6 m), weight 64.9 kg (143 lb), SpO2 92 %.  PHYSICAL EXAMINATION:  Constitutional: Appears well-developed and well-nourished. No distress. HENT: Normocephalic. Marland Kitchen Oropharynx is clear and moist.  Eyes: Conjunctivae and EOM are normal. PERRLA, no scleral icterus.  Neck: Normal ROM. Neck supple. No JVD. No tracheal deviation. CVS: RRR, S1/S2 +, 2/6 SEM,  no gallops, no carotid bruit.  Pulmonary: Effort and breath sounds normal, no stridor, rhonchi, wheezes, rales.  Abdominal: Soft. BS +,  no distension, tenderness, rebound or guarding.  Musculoskeletal: Normal range of motion. No edema and no tenderness.  Neuro: Alert. CN 2-12 grossly intact. No focal deficits. Skin: Skin is warm and dry. No rash  noted. Psychiatric: Normal mood and affect.      LABORATORY PANEL:   CBC Recent Labs  Lab 05/01/17 0413  WBC 7.4  HGB 10.4*  HCT 30.8*  PLT 234   ------------------------------------------------------------------------------------------------------------------  Chemistries  Recent Labs  Lab 05/01/17 0413  NA 135  K 4.7  CL 103  CO2 20*  GLUCOSE 129*  BUN 106*  CREATININE 3.70*  CALCIUM 8.2*   ------------------------------------------------------------------------------------------------------------------  Cardiac Enzymes Recent Labs  Lab 04/30/17 1447 04/30/17 2108 05/01/17 0219  TROPONINI 9.24* 12.08* 10.60*   ------------------------------------------------------------------------------------------------------------------  RADIOLOGY:  Dg Chest 2 View  Result Date: 04/30/2017 CLINICAL DATA:  Shortness of breath. History of coronary artery disease and CABG, pulmonary hypertension, chronic renal insufficiency, peripheral vascular disease, former smoker. EXAM: CHEST  2 VIEW COMPARISON:  PA and lateral chest x-ray of Jul 22, 2016 FINDINGS: The lungs are adequately inflated. There is a moderate-sized right pleural effusion and small left pleural effusion. The cardiac silhouette is mildly enlarged. The central pulmonary vascularity is engorged. There are post median sternotomy changes. There is calcification in the wall of the aortic arch and descending thoracic aorta. The observed bony thorax exhibits no acute abnormality. The patient has undergone previous posterior fusion in the upper lumbar spine. IMPRESSION: CHF with mild interstitial edema and bilateral pleural effusions new since the previous study. There is underlying chronic bronchitic change. Thoracic aortic atherosclerosis. Electronically Signed   By: David  Martinique M.D.   On: 04/30/2017 14:56   US Renal  Result Date: 05/01/2017 CLINICAL DATA:  Acute renal failure EXAM: RENAL / URINARY TRACT ULTRASOUND  COMPLETE COMPARISON:  None. FINDINGS: Right Kidney: Length: 11.7 cm. Echogenicity and renal cortical thickness are within normal limits. No mass, perinephric fluid, or hydronephrosis  visualized. No sonographically demonstrable calculus or ureterectasis. Left Kidney: Length: 7.7 cm. Echogenicity is increased. There is renal cortical thinning on the left. No perinephric fluid or hydronephrosis visualized. There is a cyst arising from the upper pole left kidney measuring 0.7 x 0.7 x 0.9 cm. There is a calculus in the lower pole left kidney measuring 6 mm. No ureterectasis. Bladder: Appears normal for degree of bladder distention. Incidental note is made of a left pleural effusion. IMPRESSION: 1. Left kidney is small with increased echogenicity and decreased cortical thickness consistent with atrophy. Small cyst upper pole left kidney. Nonobstructing 6 mm calculus lower pole left kidney. Etiology for the atrophy uncertain. It is possible that this atrophy is due to chronic scarring. It is also possible that there is renal artery stenosis on the left. In this regard, question whether patient is hypertensive. 2.  Right kidney appears normal. 3.  Incidental note is made of left pleural effusion. Electronically Signed   By: Lowella Grip III M.D.   On: 05/01/2017 09:19   US Venous Img Lower Bilateral  Result Date: 04/30/2017 CLINICAL DATA:  Asymmetric leg swelling, worse on the right EXAM: BILATERAL LOWER EXTREMITY VENOUS DOPPLER ULTRASOUND TECHNIQUE: Gray-scale sonography with graded compression, as well as color Doppler and duplex ultrasound were performed to evaluate the lower extremity deep venous systems from the level of the common femoral vein and including the common femoral, femoral, profunda femoral, popliteal and calf veins including the posterior tibial, peroneal and gastrocnemius veins when visible. The superficial great saphenous vein was also interrogated. Spectral Doppler was utilized to evaluate  flow at rest and with distal augmentation maneuvers in the common femoral, femoral and popliteal veins. COMPARISON:  None. FINDINGS: RIGHT LOWER EXTREMITY Common Femoral Vein: No evidence of thrombus. Normal compressibility, respiratory phasicity and response to augmentation. Saphenofemoral Junction: No evidence of thrombus. Normal compressibility and flow on color Doppler imaging. Profunda Femoral Vein: No evidence of thrombus. Normal compressibility and flow on color Doppler imaging. Femoral Vein: No evidence of thrombus. Normal compressibility, respiratory phasicity and response to augmentation. Popliteal Vein: No evidence of thrombus. Normal compressibility, respiratory phasicity and response to augmentation. Calf Veins: No evidence of thrombus. Normal compressibility and flow on color Doppler imaging. Superficial Great Saphenous Vein: No evidence of thrombus. Normal compressibility. Venous Reflux:  None. Other Findings:  None. LEFT LOWER EXTREMITY Common Femoral Vein: No evidence of thrombus. Normal compressibility, respiratory phasicity and response to augmentation. Saphenofemoral Junction: No evidence of thrombus. Normal compressibility and flow on color Doppler imaging. Profunda Femoral Vein: No evidence of thrombus. Normal compressibility and flow on color Doppler imaging. Femoral Vein: No evidence of thrombus. Normal compressibility, respiratory phasicity and response to augmentation. Popliteal Vein: No evidence of thrombus. Normal compressibility, respiratory phasicity and response to augmentation. Calf Veins: No evidence of thrombus. Normal compressibility and flow on color Doppler imaging. Superficial Great Saphenous Vein: No evidence of thrombus. Normal compressibility. Venous Reflux:  None. Other Findings:  None. IMPRESSION: No evidence of deep venous thrombosis. Electronically Signed   By: Jerilynn Mages.  Shick M.D.   On: 04/30/2017 15:54     ASSESSMENT AND PLAN:  84 year old male with history of CAD status  post CABG, PAD, hypertension , chronic kidney disease stage III and DVT on anticoagulation who presented due to shortness of breath.  1. Non-ST elevation MI: Troponin max is 12.08 Due to acute on chronic kidney disease he is currently not a candidate for cardiac catheterization Continue aspirin, statin, Coreg Continue heparin drip  Cardiology consultation appreciated Follow up on echocardiogram   2. Acute on chronic systolic heart failure: Continue IV Lasix Monitor intake and output daily weight  3. Hyperlipidemia: Continues Zetia and Lipitor  4. Hypothyroidism: Continue Synthroid  5. Essential hypertension: Continue Coreg and nifedipine 6. Acute on chronic kidney disease stage III: Nephrology consultation requested Renal ultrasound reviewed, no obstruction seen.   7. History of DVT: Currently on heparin xarelto on hold due to this Dopplers were negative for acute DVT    Management plans discussed with the patient and he is in agreement.  CODE STATUS: full  TOTAL TIME TAKING CARE OF THIS PATIENT: 30 minutes.     POSSIBLE D/C 3-5 days, DEPENDING ON CLINICAL CONDITION.   Felma Pfefferle M.D on 05/01/2017 at 10:46 AM  Between 7am to 6pm - Pager - [phone] After 6pm go to [email or web address] - password [email or web address] [facility]  Office  [phone]  CC: Primary care physician; [name], MD  Note: This dictation was prepared with Dragon dictation along with smaller phrase technology. Any transcriptional errors that result from this process are unintentional.

## 2017-05-01 NOTE — Progress Notes (Signed)
Patient very sob on exertion and also complaining of sore throat. MD Pyreddy aware and orders for ABG, HFNC, and phenol spray received. Will report to oncoming nurse and continue to monitor.

## 2017-05-01 NOTE — Progress Notes (Signed)
[facility]  ROUNDING NOTE   Subjective:   [name] admitted to [facility] on 04/30/2017 for ARF (acute renal failure) ([employer]) [N17.9] NSTEMI (non-ST elevated myocardial infarction) ([place]) [I21.4] Acute respiratory failure with hypoxia ([place]) [J96.01] Acute kidney injury superimposed on chronic kidney disease ([place]) [N17.9, N18.9] Acute on chronic congestive heart failure, unspecified heart failure type ([place]) [I50.9]  Creatinine 3.7  Chest pain and shortness of breath on exertion.   Objective:  Vital signs in last 24 hours:  Temp:  [97.6 F (36.4 C)-98.2 F (36.8 C)] 98.2 F (36.8 C) (02/26 0727) Pulse Rate:  [62-84] 84 (02/26 0727) Resp:  [13-25] 18 (02/26 0727) BP: (91-104)/(40-67) 104/50 (02/26 0727) SpO2:  [84 %-98 %] 92 % (02/26 1142) Weight:  [64.9 kg (143 lb)-66.7 kg (147 lb)] 64.9 kg (143 lb) (02/26 0255)  Weight change:  Filed Weights   04/30/17 1419 04/30/17 2032 05/01/17 0255  Weight: 66.7 kg (147 lb) 65.3 kg (143 lb 14.4 oz) 64.9 kg (143 lb)    Intake/Output: I/O last 3 completed shifts: In: 0  Out: 100 [Urine:100]   Intake/Output this shift:  Total I/O In: -  Out: 100 [Urine:100]  Physical Exam: General: NAD,   Head: Normocephalic, atraumatic. Moist oral mucosal membranes  Eyes: Anicteric, PERRL  Neck: Supple, trachea midline  Lungs:  Clear to auscultation  Heart: Regular rate and rhythm, +murmur  Abdomen:  Soft, nontender,   Extremities: no peripheral edema.  Neurologic: Nonfocal, moving all four extremities  Skin: No lesions        Basic Metabolic Panel: Recent Labs  Lab 04/30/17 1447 05/01/17 0413  NA 134* 135  K 5.0 4.7  CL 102 103  CO2 17* 20*  GLUCOSE 163* 129*  BUN 94* 106*  CREATININE 3.73* 3.70*  CALCIUM 8.5* 8.2*    Liver Function Tests: No results for input(s): AST, ALT, ALKPHOS, BILITOT, PROT, ALBUMIN in the last 168 hours. No results for input(s): LIPASE, AMYLASE in the last 168 hours. No results for  input(s): AMMONIA in the last 168 hours.  CBC: Recent Labs  Lab 04/30/17 1447 05/01/17 0413  WBC 9.9 7.4  HGB 11.0* 10.4*  HCT 33.2* 30.8*  MCV 91.9 91.6  PLT 264 234    Cardiac Enzymes: Recent Labs  Lab 04/30/17 1447 04/30/17 2108 05/01/17 0219 05/01/17 0810  TROPONINI 9.24* 12.08* 10.60* 6.92*    BNP: Invalid input(s): POCBNP  CBG: No results for input(s): GLUCAP in the last 168 hours.  Microbiology: No results found for this or any previous visit.  Coagulation Studies: Recent Labs    04/30/17 1741  LABPROT 18.0*  INR 1.50    Urinalysis: No results for input(s): COLORURINE, LABSPEC, PHURINE, GLUCOSEU, HGBUR, BILIRUBINUR, KETONESUR, PROTEINUR, UROBILINOGEN, NITRITE, LEUKOCYTESUR in the last 72 hours.  Invalid input(s): APPERANCEUR    Imaging: Dg Chest 2 View  Result Date: 04/30/2017 CLINICAL DATA:  Shortness of breath. History of coronary artery disease and CABG, pulmonary hypertension, chronic renal insufficiency, peripheral vascular disease, former smoker. EXAM: CHEST  2 VIEW COMPARISON:  PA and lateral chest x-ray of Jul 22, 2016 FINDINGS: The lungs are adequately inflated. There is a moderate-sized right pleural effusion and small left pleural effusion. The cardiac silhouette is mildly enlarged. The central pulmonary vascularity is engorged. There are post median sternotomy changes. There is calcification in the wall of the aortic arch and descending thoracic aorta. The observed bony thorax exhibits no acute abnormality. The patient has undergone previous posterior fusion in the upper lumbar  spine. IMPRESSION: CHF with mild interstitial edema and bilateral pleural effusions new since the previous study. There is underlying chronic bronchitic change. Thoracic aortic atherosclerosis. Electronically Signed   By: David  Martinique M.D.   On: 04/30/2017 14:56   US Renal  Result Date: 05/01/2017 CLINICAL DATA:  Acute renal failure EXAM: RENAL / URINARY TRACT  ULTRASOUND COMPLETE COMPARISON:  None. FINDINGS: Right Kidney: Length: 11.7 cm. Echogenicity and renal cortical thickness are within normal limits. No mass, perinephric fluid, or hydronephrosis visualized. No sonographically demonstrable calculus or ureterectasis. Left Kidney: Length: 7.7 cm. Echogenicity is increased. There is renal cortical thinning on the left. No perinephric fluid or hydronephrosis visualized. There is a cyst arising from the upper pole left kidney measuring 0.7 x 0.7 x 0.9 cm. There is a calculus in the lower pole left kidney measuring 6 mm. No ureterectasis. Bladder: Appears normal for degree of bladder distention. Incidental note is made of a left pleural effusion. IMPRESSION: 1. Left kidney is small with increased echogenicity and decreased cortical thickness consistent with atrophy. Small cyst upper pole left kidney. Nonobstructing 6 mm calculus lower pole left kidney. Etiology for the atrophy uncertain. It is possible that this atrophy is due to chronic scarring. It is also possible that there is renal artery stenosis on the left. In this regard, question whether patient is hypertensive. 2.  Right kidney appears normal. 3.  Incidental note is made of left pleural effusion. Electronically Signed   By: Lowella Grip III M.D.   On: 05/01/2017 09:19   US Venous Img Lower Bilateral  Result Date: 04/30/2017 CLINICAL DATA:  Asymmetric leg swelling, worse on the right EXAM: BILATERAL LOWER EXTREMITY VENOUS DOPPLER ULTRASOUND TECHNIQUE: Gray-scale sonography with graded compression, as well as color Doppler and duplex ultrasound were performed to evaluate the lower extremity deep venous systems from the level of the common femoral vein and including the common femoral, femoral, profunda femoral, popliteal and calf veins including the posterior tibial, peroneal and gastrocnemius veins when visible. The superficial great saphenous vein was also interrogated. Spectral Doppler was utilized to  evaluate flow at rest and with distal augmentation maneuvers in the common femoral, femoral and popliteal veins. COMPARISON:  None. FINDINGS: RIGHT LOWER EXTREMITY Common Femoral Vein: No evidence of thrombus. Normal compressibility, respiratory phasicity and response to augmentation. Saphenofemoral Junction: No evidence of thrombus. Normal compressibility and flow on color Doppler imaging. Profunda Femoral Vein: No evidence of thrombus. Normal compressibility and flow on color Doppler imaging. Femoral Vein: No evidence of thrombus. Normal compressibility, respiratory phasicity and response to augmentation. Popliteal Vein: No evidence of thrombus. Normal compressibility, respiratory phasicity and response to augmentation. Calf Veins: No evidence of thrombus. Normal compressibility and flow on color Doppler imaging. Superficial Great Saphenous Vein: No evidence of thrombus. Normal compressibility. Venous Reflux:  None. Other Findings:  None. LEFT LOWER EXTREMITY Common Femoral Vein: No evidence of thrombus. Normal compressibility, respiratory phasicity and response to augmentation. Saphenofemoral Junction: No evidence of thrombus. Normal compressibility and flow on color Doppler imaging. Profunda Femoral Vein: No evidence of thrombus. Normal compressibility and flow on color Doppler imaging. Femoral Vein: No evidence of thrombus. Normal compressibility, respiratory phasicity and response to augmentation. Popliteal Vein: No evidence of thrombus. Normal compressibility, respiratory phasicity and response to augmentation. Calf Veins: No evidence of thrombus. Normal compressibility and flow on color Doppler imaging. Superficial Great Saphenous Vein: No evidence of thrombus. Normal compressibility. Venous Reflux:  None. Other Findings:  None. IMPRESSION: No evidence of deep venous thrombosis.  Electronically Signed   By: Jerilynn Mages.  Shick M.D.   On: 04/30/2017 15:54     Medications:   . heparin 1,050 Units/hr (05/01/17 1401)    . aspirin EC  81 mg Oral Daily  . atorvastatin  80 mg Oral Daily  . budesonide (PULMICORT) nebulizer solution  0.5 mg Nebulization BID  . calcium citrate  200 mg of elemental calcium Oral Daily  . carvedilol  3.125 mg Oral BID WC  . cholecalciferol  4,000 Units Oral Daily  . ezetimibe  10 mg Oral Daily  . furosemide  40 mg Intravenous BID  . ipratropium-albuterol  3 mL Nebulization QID  . isosorbide mononitrate  60 mg Oral Daily  . latanoprost  1 drop Both Eyes QHS  . levothyroxine  75 mcg Oral QAC breakfast  . multivitamin with minerals  1 tablet Oral Daily  . NIFEdipine  30 mg Oral BID  . omega-3 acid ethyl esters   Oral BID  . timolol  1 drop Both Eyes BID   acetaminophen, ALPRAZolam, ipratropium-albuterol, nitroGLYCERIN, nitroGLYCERIN, ondansetron (ZOFRAN) IV, traMADol  Assessment/ Plan:  [name] is a 84 y.o. white male with hypertension, coronary artery disease, history of DVT, glaucoma, history of prostate cancer, hyperlipidemia, hypothyroidism, aortic stenosis, diastolic congestive heart failure who is admitted for acute coronary syndrome.   1. Acute renal failure on chronic kidney disease stage III: Baseline creatinine 1.48, GFR of 12/2016.  Acute renal failure from acute coronary syndrome and acute cardiorenal syndrome.  - Recommend renally dosing all medications. Will need IV fluids before cardiac catheterization.  - holding valsartan  2. Hypertension: with acute diastolic congestive heart failure/aortic stenosis and coronary syndrome. Blood pressure is low. Echocardiogram pending - Continue furosemide - Continue cavedilol, imdur, nifedipine - Appreciate cardiology input.   3. Anemia of chronic kidney disease: hemoglobin 10.4. No indication for ESA  4.    LOS: 1 Missey Hasley 2/26/20192:12 PM

## 2017-05-01 NOTE — Progress Notes (Addendum)
Pt complaining of sob. Up on assessment pt  o2 saturations found to be 85% on 6L n/c. Pt placed on non-rebreather, saturations remaining at 95%. Dr. Benjie Karvonen notified and orders received for STAT chest xray, IV lasix, and IV solumedrol. Will administer and continue to monitor.

## 2017-05-01 NOTE — Progress Notes (Signed)
Progress Note  Patient Name: Dennis Burns Date of Encounter: 05/01/2017  Primary Cardiologist: Ida Rogue, MD  Subjective   Breathing improved but still short of breath with prolonged talking.  No chest pain.  Doesn't feel like lasix has really worked much (20 bid yesterday).  Inpatient Medications    Scheduled Meds: . aspirin EC  81 mg Oral Daily  . atorvastatin  80 mg Oral Daily  . budesonide (PULMICORT) nebulizer solution  0.5 mg Nebulization BID  . calcium citrate  200 mg of elemental calcium Oral Daily  . carvedilol  3.125 mg Oral BID WC  . cholecalciferol  4,000 Units Oral Daily  . ezetimibe  10 mg Oral Daily  . furosemide  40 mg Intravenous BID  . ipratropium-albuterol  3 mL Nebulization QID  . isosorbide mononitrate  60 mg Oral Daily  . latanoprost  1 drop Both Eyes QHS  . levothyroxine  75 mcg Oral QAC breakfast  . multivitamin with minerals  1 tablet Oral Daily  . NIFEdipine  30 mg Oral BID  . omega-3 acid ethyl esters   Oral BID  . timolol  1 drop Both Eyes BID   Continuous Infusions: . heparin 900 Units/hr (05/01/17 0431)   PRN Meds: acetaminophen, ALPRAZolam, ipratropium-albuterol, nitroGLYCERIN, nitroGLYCERIN, ondansetron (ZOFRAN) IV, traMADol   Vital Signs    Vitals:   04/30/17 2125 05/01/17 0255 05/01/17 0727 05/01/17 0755  BP:  (!) 102/50 (!) 104/50   Pulse:  77 84   Resp:   18   Temp:  97.6 F (36.4 C) 98.2 F (36.8 C)   TempSrc:  Oral    SpO2: 91% 90% 92% 92%  Weight:  143 lb (64.9 kg)    Height:        Intake/Output Summary (Last 24 hours) at 05/01/2017 1028 Last data filed at 05/01/2017 0915 Gross per 24 hour  Intake 0 ml  Output 200 ml  Net -200 ml   Filed Weights   04/30/17 1419 04/30/17 2032 05/01/17 0255  Weight: 147 lb (66.7 kg) 143 lb 14.4 oz (65.3 kg) 143 lb (64.9 kg)    Physical Exam   GEN: Well nourished, well developed, in no acute distress.  HEENT: Grossly normal. HOH. Neck: Supple, no carotid bruits, or  masses. JVP ~ 14 cm. Cardiac: RRR 2/6 SEM throughout, no rubs, or gallops. No clubbing, cyanosis, 1+ bilat LE edema.  Radials/DP/PT 1+ and equal bilaterally.  Respiratory:  Respirations regular and unlabored, diminished breath sounds bilaterally. GI: Soft, nontender, nondistended, BS + x 4. MS: no deformity or atrophy. Skin: warm and dry, no rash. Neuro:  Strength and sensation are intact. Psych: AAOx3.  Normal affect.  Labs    Chemistry Recent Labs  Lab 04/30/17 1447 05/01/17 0413  NA 134* 135  K 5.0 4.7  CL 102 103  CO2 17* 20*  GLUCOSE 163* 129*  BUN 94* 106*  CREATININE 3.73* 3.70*  CALCIUM 8.5* 8.2*  GFRNONAA 13* 14*  GFRAA 16* 16*  ANIONGAP 15 12     Hematology Recent Labs  Lab 04/30/17 1447 05/01/17 0413  WBC 9.9 7.4  RBC 3.62* 3.36*  HGB 11.0* 10.4*  HCT 33.2* 30.8*  MCV 91.9 91.6  MCH 30.4 30.9  MCHC 33.0 33.7  RDW 15.3* 15.1*  PLT 264 234    Cardiac Enzymes Recent Labs  Lab 04/30/17 1447 04/30/17 2108 05/01/17 0219  TROPONINI 9.24* 12.08* 10.60*   BNP Recent Labs  Lab 04/30/17 1741 04/30/17 2108  BNP 4,309.0* 4,115.0*  Radiology    Dg Chest 2 View  Result Date: 04/30/2017 CLINICAL DATA:  Shortness of breath. History of coronary artery disease and CABG, pulmonary hypertension, chronic renal insufficiency, peripheral vascular disease, former smoker. EXAM: CHEST  2 VIEW COMPARISON:  PA and lateral chest x-ray of Jul 22, 2016 FINDINGS: The lungs are adequately inflated. There is a moderate-sized right pleural effusion and small left pleural effusion. The cardiac silhouette is mildly enlarged. The central pulmonary vascularity is engorged. There are post median sternotomy changes. There is calcification in the wall of the aortic arch and descending thoracic aorta. The observed bony thorax exhibits no acute abnormality. The patient has undergone previous posterior fusion in the upper lumbar spine. IMPRESSION: CHF with mild interstitial edema  and bilateral pleural effusions new since the previous study. There is underlying chronic bronchitic change. Thoracic aortic atherosclerosis. Electronically Signed   By: David  Martinique M.D.   On: 04/30/2017 14:56   US Renal  Result Date: 05/01/2017 CLINICAL DATA:  Acute renal failure EXAM: RENAL / URINARY TRACT ULTRASOUND COMPLETE COMPARISON:  None. FINDINGS: Right Kidney: Length: 11.7 cm. Echogenicity and renal cortical thickness are within normal limits. No mass, perinephric fluid, or hydronephrosis visualized. No sonographically demonstrable calculus or ureterectasis. Left Kidney: Length: 7.7 cm. Echogenicity is increased. There is renal cortical thinning on the left. No perinephric fluid or hydronephrosis visualized. There is a cyst arising from the upper pole left kidney measuring 0.7 x 0.7 x 0.9 cm. There is a calculus in the lower pole left kidney measuring 6 mm. No ureterectasis. Bladder: Appears normal for degree of bladder distention. Incidental note is made of a left pleural effusion. IMPRESSION: 1. Left kidney is small with increased echogenicity and decreased cortical thickness consistent with atrophy. Small cyst upper pole left kidney. Nonobstructing 6 mm calculus lower pole left kidney. Etiology for the atrophy uncertain. It is possible that this atrophy is due to chronic scarring. It is also possible that there is renal artery stenosis on the left. In this regard, question whether patient is hypertensive. 2.  Right kidney appears normal. 3.  Incidental note is made of left pleural effusion. Electronically Signed   By: Lowella Grip III M.D.   On: 05/01/2017 09:19   US Venous Img Lower Bilateral  Result Date: 04/30/2017 CLINICAL DATA:  Asymmetric leg swelling, worse on the right EXAM: BILATERAL LOWER EXTREMITY VENOUS DOPPLER ULTRASOUND TECHNIQUE: Gray-scale sonography with graded compression, as well as color Doppler and duplex ultrasound were performed to evaluate the lower extremity deep  venous systems from the level of the common femoral vein and including the common femoral, femoral, profunda femoral, popliteal and calf veins including the posterior tibial, peroneal and gastrocnemius veins when visible. The superficial great saphenous vein was also interrogated. Spectral Doppler was utilized to evaluate flow at rest and with distal augmentation maneuvers in the common femoral, femoral and popliteal veins. COMPARISON:  None. FINDINGS: RIGHT LOWER EXTREMITY Common Femoral Vein: No evidence of thrombus. Normal compressibility, respiratory phasicity and response to augmentation. Saphenofemoral Junction: No evidence of thrombus. Normal compressibility and flow on color Doppler imaging. Profunda Femoral Vein: No evidence of thrombus. Normal compressibility and flow on color Doppler imaging. Femoral Vein: No evidence of thrombus. Normal compressibility, respiratory phasicity and response to augmentation. Popliteal Vein: No evidence of thrombus. Normal compressibility, respiratory phasicity and response to augmentation. Calf Veins: No evidence of thrombus. Normal compressibility and flow on color Doppler imaging. Superficial Great Saphenous Vein: No evidence of thrombus. Normal compressibility. Venous Reflux:  None. Other Findings:  None. LEFT LOWER EXTREMITY Common Femoral Vein: No evidence of thrombus. Normal compressibility, respiratory phasicity and response to augmentation. Saphenofemoral Junction: No evidence of thrombus. Normal compressibility and flow on color Doppler imaging. Profunda Femoral Vein: No evidence of thrombus. Normal compressibility and flow on color Doppler imaging. Femoral Vein: No evidence of thrombus. Normal compressibility, respiratory phasicity and response to augmentation. Popliteal Vein: No evidence of thrombus. Normal compressibility, respiratory phasicity and response to augmentation. Calf Veins: No evidence of thrombus. Normal compressibility and flow on color Doppler  imaging. Superficial Great Saphenous Vein: No evidence of thrombus. Normal compressibility. Venous Reflux:  None. Other Findings:  None. IMPRESSION: No evidence of deep venous thrombosis. Electronically Signed   By: Jerilynn Mages.  Shick M.D.   On: 04/30/2017 15:54    Telemetry    RSR - Personally Reviewed  Cardiac Studies   11.21.2018 2D Echocardiogram   - Left ventricle: Wall thickness was increased in a pattern of mild   LVH. Systolic function was normal. The estimated ejection   fraction was in the range of 55% to 60%. Wall motion was normal;   there were no regional wall motion abnormalities. Features are   consistent with a pseudonormal left ventricular filling pattern,   with concomitant abnormal relaxation and increased filling   pressure (grade 2 diastolic dysfunction). Doppler parameters are   consistent with high ventricular filling pressure. - Aortic valve: Trileaflet; moderately thickened, mildly calcified   leaflets. Transvalvular velocity was increased. There was   moderate stenosis. There was moderate regurgitation. Mean   gradient (S): 17 mm Hg. VTI ratio of LVOT to aortic valve: 0.34.   Valve area (VTI): 1.07 cm^2. Mean velocity ratio of LVOT to   aortic valve: 0.31. - Mitral valve: Calcified annulus. Moderately thickened leaflets .   There was mild regurgitation. - Left atrium: The atrium was mildly dilated. - Right ventricle: The cavity size was normal. Systolic function   was mildly reduced. _____________   Cardiac catheterization in December 2018 that showed severe underlying heavily calcified three-vessel coronary artery disease with patent LIMA to LAD and SVG to distal RCA. SVG to OM was likely occluded but could not be selectively engaged from the left radial artery. The patient was treated medically  _____________   Patient Profile     84 y.o. male w/ a h/o CAD s/p CABG in 2003, PAH, mod AS/AI, carotid dzs s/p R CEA, PAD, HTN, HL, DVT on chronic xarelto, CKD III,  prostate CA, sciatica, and spinal stenosis, who was admitted from the office on 2/25 and found to have significant volume overload, AKI, and NSTEMI.  Assessment & Plan    1.  NSTEMI/CAD:  Pt with progressive dyspnea and volume overload in the outpt setting, admited 2/25 after being seen in the office.  Trop initially 9.24 and peaked @ 12.08.  Now trending down.  No chest pain at any point recently, just dyspnea, likely representing an anginal equivalent.  Cath in 02/2017 showed 2/3 patent grafts (VG .arow OM presumed to be occluded).  Med Rx recommended.  In setting of NSTEMI, relook cath ideal, however, with AKI/CKD III - IV, he is not currently a candidate.  Creat stable @ 3.7 this AM.  Cont med Rx including asa, statin,  blocker, nitrate.  Diurese.  2.  Acute on chronic diastolic CHF:  In setting of PAH and AS/AI.  He remains volume overloaded on exam.  He doesn't think he's responded much to lasix.  Only minus 200  recorded since admission.  We will escalate lasix to 40 IV bid this AM.  Defer further titration to nephrology.  3.  Acute on chronic stage III kidney dzs:  Creat stable @ 3.7 this AM.  Was 1.25 after cath in December.  Nephrology consult pending.  Follow with diuresis.  4.  Essential HTN:  BP soft.  Will back down on procardia dose.  5.  HL:  Cont statin/zetia.  LDL 58.  6.  H/o DVT:  On chronic xarelto  on hold - currently on heparin.  Pending renal fxn recovery, may need to consider alternate [place].  Signed, Murray Hodgkins, NP  05/01/2017, 10:28 AM    For questions or updates, please contact   Please consult [email or web address] for contact info under Cardiology/STEMI.

## 2017-05-01 NOTE — Progress Notes (Signed)
ANTICOAGULATION CONSULT NOTE - Initial Consult  Pharmacy Consult for heparin drip Indication: chest pain/ACS  No Known Allergies  Patient Measurements: Height: 5\' 3"  (160 cm) Weight: 143 lb (64.9 kg) IBW/kg (Calculated) : 56.9 Heparin Dosing Weight: 67 kg  Vital Signs: Temp: 98.2 F (36.8 C) (02/26 0727) Temp Source: Oral (02/26 0255) BP: 104/50 (02/26 0727) Pulse Rate: 84 (02/26 0727)  Labs: Recent Labs    04/30/17 1447 04/30/17 1741 04/30/17 2108 05/01/17 0219 05/01/17 0413 05/01/17 0810 05/01/17 1004  HGB 11.0*  --   --   --  10.4*  --   --   HCT 33.2*  --   --   --  30.8*  --   --   PLT 264  --   --   --  234  --   --   APTT  --  48*  --  50*  --   --  60*  LABPROT  --  18.0*  --   --   --   --   --   INR  --  1.50  --   --   --   --   --   HEPARINUNFRC  --  2.04*  --  1.44*  --   --  1.40*  CREATININE 3.73*  --   --   --  3.70*  --   --   TROPONINI 9.24*  --  12.08* 10.60*  --  6.92*  --     Estimated Creatinine Clearance: 11.5 mL/min (A) (by C-G formula based on SCr of 3.7 mg/dL (H)).   Medical History: Past Medical History:  Diagnosis Date  . Allergic rhinitis due to pollen    as child--better now  . Chronic kidney disease, stage III (moderate) (HCC)   . Coronary artery disease   . DVT, lower extremity, recurrent ([place])   . Glaucoma    [facility]   . History of prostate cancer 2004  . Hyperlipidemia   . Hypertension   . Hypothyroidism   . Impaired fasting glucose   . Spinal stenosis of lumbar region with radiculopathy     Assessment: Pharmacy consulted to dose and monitor heparin drip in this 84 year old man for ACS.  Patient was taking rivaroxaban PTA and reports last dose taken on 2/24 around 2000.   Baseline labs ordered including baseline heparin level. If baseline heparin level is elevated, will need to dose/monitor using  APTT until levels correlate.  Goal of Therapy:  APTT 66-102 seconds Heparin level 0.3-0.7 units/ml Monitor  platelets by anticoagulation protocol: Yes   Plan:  No bolus as patient is currently anticoagulated on rivaroxaban Will start heparin 800 units/hr 24 hours after last rivaroxaban dose per protocol Will check HL and APTT in 8 hours and CBC daily.  02/26 @ 0200 HL 1.44, aPTT 50, HL supratherapeutic (but trending down), aPTT subtherapeutic. Both not correlating, will rebolus w/ heparin 1000 units IV x 1 and increase rate to 900 units/hr. Will recheck both HL/aPTT @ 1000, will continue to dose off of aPTT until both HL/aPTT correlate then will go back to dosing off of HL.   02/26 @ 1400: aPTT 60, will increase heparin infusion to 1050 units/hr and recheck aPTT in 8 hours.   Napoleon Form, PharmD, BCPS Clinical Pharmacist 05/01/2017,1:57 PM

## 2017-05-01 NOTE — Progress Notes (Signed)
*  PRELIMINARY RESULTS* Echocardiogram 2D Echocardiogram has been performed.  Dennis Burns 05/01/2017, 2:30 PM

## 2017-05-01 NOTE — Plan of Care (Signed)
  Progressing Education: Knowledge of General Education information will improve 05/01/2017 2058 - Progressing by Marylouise Stacks, RN Health Behavior/Discharge Planning: Ability to manage health-related needs will improve 05/01/2017 2058 - Progressing by Marylouise Stacks, RN Clinical Measurements: Ability to maintain clinical measurements within normal limits will improve 05/01/2017 2058 - Progressing by Marylouise Stacks, RN Diagnostic test results will improve 05/01/2017 2058 - Progressing by Marylouise Stacks, RN Respiratory complications will improve 05/01/2017 2058 - Progressing by Marylouise Stacks, RN Cardiovascular complication will be avoided 05/01/2017 2058 - Progressing by Marylouise Stacks, RN Safety: Ability to remain free from injury will improve 05/01/2017 2058 - Progressing by Marylouise Stacks, RN Skin Integrity: Risk for impaired skin integrity will decrease 05/01/2017 2058 - Progressing by Marylouise Stacks, RN Cardiac: Ability to achieve and maintain adequate cardiovascular perfusion will improve 05/01/2017 2058 - Progressing by Marylouise Stacks, RN

## 2017-05-01 NOTE — Progress Notes (Signed)
ANTICOAGULATION CONSULT NOTE - Initial Consult  Pharmacy Consult for heparin drip Indication: chest pain/ACS  No Known Allergies  Patient Measurements: Height: 5\' 3"  (160 cm) Weight: 143 lb (64.9 kg) IBW/kg (Calculated) : 56.9 Heparin Dosing Weight: 67 kg  Vital Signs: Temp: 98.9 F (37.2 C) (02/26 2036) Temp Source: Oral (02/26 2036) BP: 108/53 (02/26 2036) Pulse Rate: 84 (02/26 2036)  Labs: Recent Labs    04/30/17 1447  04/30/17 1741 04/30/17 2108 05/01/17 0219 05/01/17 0413 05/01/17 0810 05/01/17 1004 05/01/17 2248  HGB 11.0*  --   --   --   --  10.4*  --   --   --   HCT 33.2*  --   --   --   --  30.8*  --   --   --   PLT 264  --   --   --   --  234  --   --   --   APTT  --    < > 48*  --  50*  --   --  60* 66*  LABPROT  --   --  18.0*  --   --   --   --   --   --   INR  --   --  1.50  --   --   --   --   --   --   HEPARINUNFRC  --   --  2.04*  --  1.44*  --   --  1.40*  --   CREATININE 3.73*  --   --   --   --  3.70*  --   --   --   TROPONINI 9.24*  --   --  12.08* 10.60*  --  6.92*  --   --    < > = values in this interval not displayed.    Estimated Creatinine Clearance: 11.5 mL/min (A) (by C-G formula based on SCr of 3.7 mg/dL (H)).   Medical History: Past Medical History:  Diagnosis Date  . Allergic rhinitis due to pollen    as child--better now  . Chronic kidney disease, stage III (moderate) (HCC)   . Coronary artery disease   . DVT, lower extremity, recurrent ([place])   . Glaucoma    [facility]   . History of prostate cancer 2004  . Hyperlipidemia   . Hypertension   . Hypothyroidism   . Impaired fasting glucose   . Spinal stenosis of lumbar region with radiculopathy     Assessment: Pharmacy consulted to dose and monitor heparin drip in this 84 year old man for ACS.  Patient was taking rivaroxaban PTA and reports last dose taken on 2/24 around 2000.   Baseline labs ordered including baseline heparin level. If baseline heparin level is  elevated, will need to dose/monitor using  APTT until levels correlate.  Goal of Therapy:  APTT 66-102 seconds Heparin level 0.3-0.7 units/ml Monitor platelets by anticoagulation protocol: Yes   Plan:  No bolus as patient is currently anticoagulated on rivaroxaban Will start heparin 800 units/hr 24 hours after last rivaroxaban dose per protocol Will check HL and APTT in 8 hours and CBC daily.  02/26 @ 0200 HL 1.44, aPTT 50, HL supratherapeutic (but trending down), aPTT subtherapeutic. Both not correlating, will rebolus w/ heparin 1000 units IV x 1 and increase rate to 900 units/hr. Will recheck both HL/aPTT @ 1000, will continue to dose off of aPTT until both HL/aPTT correlate then will go back  to dosing off of HL.   02/26 @ 1400: aPTT 60, will increase heparin infusion to 1050 units/hr and recheck aPTT in 8 hours.   02/26 @ 2248 aPTT 66 therapeutic. Will continue current rate and recheck HL/aPTT @ 0700.  Tobie Lords, PharmD, BCPS Clinical Pharmacist 05/01/2017,11:55 PM

## 2017-05-01 NOTE — Plan of Care (Signed)
  Progressing Education: Knowledge of General Education information will improve 05/01/2017 1253 - Progressing by Rolley Sims, RN Health Behavior/Discharge Planning: Ability to manage health-related needs will improve 05/01/2017 1253 - Progressing by Rolley Sims, RN Clinical Measurements: Will remain free from infection 05/01/2017 1253 - Progressing by Rolley Sims, RN Pain Managment: General experience of comfort will improve 05/01/2017 1253 - Progressing by Rolley Sims, RN

## 2017-05-02 DIAGNOSIS — I272 Pulmonary hypertension, unspecified: Secondary | ICD-10-CM

## 2017-05-02 DIAGNOSIS — J9601 Acute respiratory failure with hypoxia: Secondary | ICD-10-CM

## 2017-05-02 DIAGNOSIS — N170 Acute kidney failure with tubular necrosis: Secondary | ICD-10-CM

## 2017-05-02 LAB — BASIC METABOLIC PANEL
ANION GAP: 12 (ref 5–15)
BUN: 105 mg/dL — AB (ref 6–20)
CALCIUM: 8.6 mg/dL — AB (ref 8.9–10.3)
CO2: 18 mmol/L — AB (ref 22–32)
Chloride: 106 mmol/L (ref 101–111)
Creatinine, Ser: 2.49 mg/dL — ABNORMAL HIGH (ref 0.61–1.24)
GFR calc Af Amer: 25 mL/min — ABNORMAL LOW (ref >60)
GFR calc non Af Amer: 22 mL/min — ABNORMAL LOW (ref >60)
GLUCOSE: 200 mg/dL — AB (ref 65–99)
Potassium: 4.3 mmol/L (ref 3.5–5.1)
Sodium: 136 mmol/L (ref 135–145)

## 2017-05-02 LAB — CBC
HEMATOCRIT: 31 % — AB (ref 40.0–52.0)
HEMOGLOBIN: 10.7 g/dL — AB (ref 13.0–18.0)
MCH: 31.1 pg (ref 26.0–34.0)
MCHC: 34.6 g/dL (ref 32.0–36.0)
MCV: 90 fL (ref 80.0–100.0)
Platelets: 278 10*3/uL (ref 150–440)
RBC: 3.44 MIL/uL — ABNORMAL LOW (ref 4.40–5.90)
RDW: 15 % — AB (ref 11.5–14.5)
WBC: 6.1 10*3/uL (ref 3.8–10.6)

## 2017-05-02 LAB — HEPARIN LEVEL (UNFRACTIONATED): HEPARIN UNFRACTIONATED: 0.53 [IU]/mL (ref 0.30–0.70)

## 2017-05-02 LAB — APTT: aPTT: 78 seconds — ABNORMAL HIGH (ref 24–36)

## 2017-05-02 MED ORDER — SODIUM CHLORIDE 0.9% FLUSH
3.0000 mL | Freq: Two times a day (BID) | INTRAVENOUS | Status: DC
  Administered 2017-05-02 – 2017-05-10 (×16): 3 mL via INTRAVENOUS

## 2017-05-02 MED ORDER — FUROSEMIDE 10 MG/ML IJ SOLN
40.0000 mg | Freq: Three times a day (TID) | INTRAMUSCULAR | Status: DC
Start: 2017-05-02 — End: 2017-05-04
  Administered 2017-05-02 – 2017-05-04 (×5): 40 mg via INTRAVENOUS
  Filled 2017-05-02 (×5): qty 4

## 2017-05-02 NOTE — Plan of Care (Signed)
  Progressing Education: Knowledge of General Education information will improve 05/02/2017 1848 - Progressing by Rolley Sims, RN Clinical Measurements: Will remain free from infection 05/02/2017 1848 - Progressing by Rolley Sims, RN Respiratory complications will improve 05/02/2017 1848 - Progressing by Rolley Sims, RN Safety: Ability to remain free from injury will improve 05/02/2017 1848 - Progressing by Rolley Sims, RN

## 2017-05-02 NOTE — Progress Notes (Signed)
ANTICOAGULATION CONSULT NOTE - Initial Consult  Pharmacy Consult for heparin drip Indication: chest pain/ACS  No Known Allergies  Patient Measurements: Height: 5\' 3"  (160 cm) Weight: 142 lb 8 oz (64.6 kg) IBW/kg (Calculated) : 56.9 Heparin Dosing Weight: 67 kg  Vital Signs: Temp: 97.5 F (36.4 C) (02/27 0525) Temp Source: Oral (02/27 0525) BP: 101/47 (02/27 0525) Pulse Rate: 81 (02/27 0525)  Labs: Recent Labs    04/30/17 1447  04/30/17 1741 04/30/17 2108 05/01/17 0219 05/01/17 0413 05/01/17 0810 05/01/17 1004 05/01/17 2248 05/02/17 0638  HGB 11.0*  --   --   --   --  10.4*  --   --   --  10.7*  HCT 33.2*  --   --   --   --  30.8*  --   --   --  31.0*  PLT 264  --   --   --   --  234  --   --   --  278  APTT  --    < > 48*  --  50*  --   --  60* 66* 78*  LABPROT  --   --  18.0*  --   --   --   --   --   --   --   INR  --   --  1.50  --   --   --   --   --   --   --   HEPARINUNFRC  --    < > 2.04*  --  1.44*  --   --  1.40*  --  0.53  CREATININE 3.73*  --   --   --   --  3.70*  --   --   --  2.49*  TROPONINI 9.24*  --   --  12.08* 10.60*  --  6.92*  --   --   --    < > = values in this interval not displayed.    Estimated Creatinine Clearance: 17.1 mL/min (A) (by C-G formula based on SCr of 2.49 mg/dL (H)).   Medical History: Past Medical History:  Diagnosis Date  . Allergic rhinitis due to pollen    as child--better now  . Chronic kidney disease, stage III (moderate) (HCC)   . Coronary artery disease   . DVT, lower extremity, recurrent ([place])   . Glaucoma    [facility]   . History of prostate cancer 2004  . Hyperlipidemia   . Hypertension   . Hypothyroidism   . Impaired fasting glucose   . Spinal stenosis of lumbar region with radiculopathy     Assessment: Pharmacy consulted to dose and monitor heparin drip in this 84 year old man for ACS.  Patient was taking rivaroxaban PTA and reports last dose taken on 2/24 around 2000.   Baseline labs ordered  including baseline heparin level. If baseline heparin level is elevated, will need to dose/monitor using  APTT until levels correlate.  Goal of Therapy:  APTT 66-102 seconds Heparin level 0.3-0.7 units/ml Monitor platelets by anticoagulation protocol: Yes   Plan:  No bolus as patient is currently anticoagulated on rivaroxaban Will start heparin 800 units/hr 24 hours after last rivaroxaban dose per protocol Will check HL and APTT in 8 hours and CBC daily.  02/26 @ 0200 HL 1.44, aPTT 50, HL supratherapeutic (but trending down), aPTT subtherapeutic. Both not correlating, will rebolus w/ heparin 1000 units IV x 1 and increase rate to 900 units/hr. Will  recheck both HL/aPTT @ 1000, will continue to dose off of aPTT until both HL/aPTT correlate then will go back to dosing off of HL.   02/26 @ 1400: aPTT 60, will increase heparin infusion to 1050 units/hr and recheck aPTT in 8 hours.   02/26 @ 2248 aPTT 66 therapeutic. Will continue current rate and recheck HL/aPTT @ 0700.  02/27 @ 0815: aPTT 78, HL 0.53. Heparin Level and aPTT now correlate and both are in range. Continue heparin infusion at 1050 unit/hr. No need to further check aPTT. Next HL/CBC in AM.   Napoleon Form, PharmD, BCPS Clinical Pharmacist 05/02/2017,8:17 AM

## 2017-05-02 NOTE — Progress Notes (Signed)
[facility] NAME: Dennis Burns    MR#:  [id number]  DATE OF BIRTH:  1928/09/28  SUBJECTIVE:  Patient with increased shortness of breath. He is on high flow nasal cannula.  REVIEW OF SYSTEMS:    Review of Systems  Constitutional: Negative for fever, chills weight loss HENT: Negative for ear pain, nosebleeds, congestion, facial swelling, rhinorrhea, neck pain, neck stiffness and ear discharge.   Respiratory: Negative for cough, ++shortness of breath, NO wheezing  Cardiovascular: Negative for chest pain, palpitations and leg swelling.  Gastrointestinal: Negative for heartburn, abdominal pain, vomiting, diarrhea or consitpation Genitourinary: Negative for dysuria, urgency, frequency, hematuria Musculoskeletal: Negative for back pain or joint pain Neurological: Negative for dizziness, seizures, syncope, focal weakness,  numbness and headaches.  Hematological: Does not bruise/bleed easily.  Psychiatric/Behavioral: Negative for hallucinations, confusion, dysphoric mood    Tolerating Diet: yes      DRUG ALLERGIES:  No Known Allergies  VITALS:  Blood pressure (!) 109/58, pulse 83, temperature 98 F (36.7 C), temperature source Oral, resp. rate 18, height 5\' 3"  (1.6 m), weight 64.6 kg (142 lb 8 oz), SpO2 97 %.  PHYSICAL EXAMINATION:  Constitutional: Appears well-developed and well-nourished. No distress. HENT: Normocephalic. Patient on high flow nasal cannula Eyes: Conjunctivae and EOM are normal. PERRLA, no scleral icterus.  Neck: Normal ROM. Neck supple. ++JVD. No tracheal deviation. CVS: RRR, S1/S2 +, 2/6 SEM,  no gallops, no carotid bruit.  Pulmonary: Effort and breath sounds normal, no stridor, rhonchi, wheezes, rales.  Abdominal: Soft. BS +,  no distension, tenderness, rebound or guarding.  Musculoskeletal: Normal range of motion. No edema and no tenderness.  Neuro: Alert. CN 2-12 grossly intact. No focal deficits. Skin:  Skin is warm and dry. No rash noted. Psychiatric: Normal mood and affect.      LABORATORY PANEL:   CBC Recent Labs  Lab 05/02/17 0638  WBC 6.1  HGB 10.7*  HCT 31.0*  PLT 278   ------------------------------------------------------------------------------------------------------------------  Chemistries  Recent Labs  Lab 05/02/17 0638  NA 136  K 4.3  CL 106  CO2 18*  GLUCOSE 200*  BUN 105*  CREATININE 2.49*  CALCIUM 8.6*   ------------------------------------------------------------------------------------------------------------------  Cardiac Enzymes Recent Labs  Lab 04/30/17 2108 05/01/17 0219 05/01/17 0810  TROPONINI 12.08* 10.60* 6.92*   ------------------------------------------------------------------------------------------------------------------  RADIOLOGY:  Dg Chest 2 View  Result Date: 04/30/2017 CLINICAL DATA:  Shortness of breath. History of coronary artery disease and CABG, pulmonary hypertension, chronic renal insufficiency, peripheral vascular disease, former smoker. EXAM: CHEST  2 VIEW COMPARISON:  PA and lateral chest x-ray of Jul 22, 2016 FINDINGS: The lungs are adequately inflated. There is a moderate-sized right pleural effusion and small left pleural effusion. The cardiac silhouette is mildly enlarged. The central pulmonary vascularity is engorged. There are post median sternotomy changes. There is calcification in the wall of the aortic arch and descending thoracic aorta. The observed bony thorax exhibits no acute abnormality. The patient has undergone previous posterior fusion in the upper lumbar spine. IMPRESSION: CHF with mild interstitial edema and bilateral pleural effusions new since the previous study. There is underlying chronic bronchitic change. Thoracic aortic atherosclerosis. Electronically Signed   By: David  Martinique M.D.   On: 04/30/2017 14:56   US Renal  Result Date: 05/01/2017 CLINICAL DATA:  Acute renal failure EXAM: RENAL /  URINARY TRACT ULTRASOUND COMPLETE COMPARISON:  None. FINDINGS: Right Kidney: Length: 11.7 cm. Echogenicity and renal cortical thickness are within normal limits. No  mass, perinephric fluid, or hydronephrosis visualized. No sonographically demonstrable calculus or ureterectasis. Left Kidney: Length: 7.7 cm. Echogenicity is increased. There is renal cortical thinning on the left. No perinephric fluid or hydronephrosis visualized. There is a cyst arising from the upper pole left kidney measuring 0.7 x 0.7 x 0.9 cm. There is a calculus in the lower pole left kidney measuring 6 mm. No ureterectasis. Bladder: Appears normal for degree of bladder distention. Incidental note is made of a left pleural effusion. IMPRESSION: 1. Left kidney is small with increased echogenicity and decreased cortical thickness consistent with atrophy. Small cyst upper pole left kidney. Nonobstructing 6 mm calculus lower pole left kidney. Etiology for the atrophy uncertain. It is possible that this atrophy is due to chronic scarring. It is also possible that there is renal artery stenosis on the left. In this regard, question whether patient is hypertensive. 2.  Right kidney appears normal. 3.  Incidental note is made of left pleural effusion. Electronically Signed   By: Lowella Grip III M.D.   On: 05/01/2017 09:19   US Venous Img Lower Bilateral  Result Date: 04/30/2017 CLINICAL DATA:  Asymmetric leg swelling, worse on the right EXAM: BILATERAL LOWER EXTREMITY VENOUS DOPPLER ULTRASOUND TECHNIQUE: Gray-scale sonography with graded compression, as well as color Doppler and duplex ultrasound were performed to evaluate the lower extremity deep venous systems from the level of the common femoral vein and including the common femoral, femoral, profunda femoral, popliteal and calf veins including the posterior tibial, peroneal and gastrocnemius veins when visible. The superficial great saphenous vein was also interrogated. Spectral Doppler was  utilized to evaluate flow at rest and with distal augmentation maneuvers in the common femoral, femoral and popliteal veins. COMPARISON:  None. FINDINGS: RIGHT LOWER EXTREMITY Common Femoral Vein: No evidence of thrombus. Normal compressibility, respiratory phasicity and response to augmentation. Saphenofemoral Junction: No evidence of thrombus. Normal compressibility and flow on color Doppler imaging. Profunda Femoral Vein: No evidence of thrombus. Normal compressibility and flow on color Doppler imaging. Femoral Vein: No evidence of thrombus. Normal compressibility, respiratory phasicity and response to augmentation. Popliteal Vein: No evidence of thrombus. Normal compressibility, respiratory phasicity and response to augmentation. Calf Veins: No evidence of thrombus. Normal compressibility and flow on color Doppler imaging. Superficial Great Saphenous Vein: No evidence of thrombus. Normal compressibility. Venous Reflux:  None. Other Findings:  None. LEFT LOWER EXTREMITY Common Femoral Vein: No evidence of thrombus. Normal compressibility, respiratory phasicity and response to augmentation. Saphenofemoral Junction: No evidence of thrombus. Normal compressibility and flow on color Doppler imaging. Profunda Femoral Vein: No evidence of thrombus. Normal compressibility and flow on color Doppler imaging. Femoral Vein: No evidence of thrombus. Normal compressibility, respiratory phasicity and response to augmentation. Popliteal Vein: No evidence of thrombus. Normal compressibility, respiratory phasicity and response to augmentation. Calf Veins: No evidence of thrombus. Normal compressibility and flow on color Doppler imaging. Superficial Great Saphenous Vein: No evidence of thrombus. Normal compressibility. Venous Reflux:  None. Other Findings:  None. IMPRESSION: No evidence of deep venous thrombosis. Electronically Signed   By: Jerilynn Mages.  Shick M.D.   On: 04/30/2017 15:54   Dg Chest Port 1 View  Result Date:  05/01/2017 CLINICAL DATA:  Short of breath EXAM: PORTABLE CHEST 1 VIEW COMPARISON:  04/30/2017, 07/22/2016 FINDINGS: Post sternotomy changes. Cardiomegaly with vascular congestion. Development of diffuse interstitial and airspace disease suspicious for pulmonary edema with more confluent airspace disease at the bases. Tiny pleural effusions. Aortic atherosclerosis. No pneumothorax. Surgical clips at the right  neck IMPRESSION: 1. Cardiomegaly with vascular congestion and worsening interstitial and alveolar opacity most consistent with pulmonary edema. More confluent opacity at the lung bases makes it difficult to exclude superimposed pneumonia 2. Tiny pleural effusions. Electronically Signed   By: Donavan Foil M.D.   On: 05/01/2017 17:10     ASSESSMENT AND PLAN:  84 year old male with history of CAD status post CABG, PAD, hypertension , chronic kidney disease stage III and DVT on anticoagulation who presented due to shortness of breath.  1. Non-ST elevation MI: Troponin max is 12.08 Due to acute on chronic kidney disease he is currently not a candidate for cardiac catheterization Continue aspirin, statin, Coreg Continue heparin drip Cardiology consultation appreciated Echocardiogram shows ejection fraction 35-40% with probable hypokinesis of the apical anterior,lateral, and apical myocardium. Probable hypokinesis of the basal-midinferolateral myocardium   2. Acute hypoxic respiratory failure due to Acute on chronic combined systolic and diastolic heart failure with ejection fraction 35-40%  heart failure:  Continue aggressive IV Lasix Monitor intake and output daily weight Wean high flow O2 nasal cannula as tolerated Echo shows elevated right heart pressure.  3. Hyperlipidemia: Continues Zetia and Lipitor  4. Hypothyroidism: Continue Synthroid  5. Essential hypertension: Continue Coreg and nifedipine 6. Acute on chronic kidney disease stage III: Nephrology consultation appreciated acute  kidney injury in the setting of cardiorenal syndrome  Creatinine improving with diuresis   Renal ultrasound reviewed, no obstruction seen.   7. History of DVT: Currently on heparin xarelto on hold due to this Dopplers were negative for acute DVT    Management plans discussed with the patient and he is in agreement.  CODE STATUS: full  TOTAL TIME TAKING CARE OF THIS PATIENT: 28 minutes.   D/w dr Abigail Butts and dr [name]  POSSIBLE D/C 3-4 days, DEPENDING ON CLINICAL CONDITION.   Cael Worth M.D on 05/02/2017 at 10:46 AM  Between 7am to 6pm - Pager - [phone] After 6pm go to [email or web address] - password [email or web address] [facility]  Office  [phone]  CC: Primary care physician; [name], MD  Note: This dictation was prepared with Dragon dictation along with smaller phrase technology. Any transcriptional errors that result from this process are unintentional.

## 2017-05-02 NOTE — Progress Notes (Signed)
Progress Note  Patient Name: Dennis Burns Date of Encounter: 05/02/2017  Primary Cardiologist: Ida Rogue, MD  Subjective   Breathing is improving after 3 doses of Lasix IV yesterday Yesterday could not even eat, today had some breakfast, still on high flow nasal cannula oxygen Still with respiratory distress but much better than yesterday when symptoms were severe  Inpatient Medications    Scheduled Meds: . aspirin EC  81 mg Oral Daily  . atorvastatin  80 mg Oral Daily  . budesonide (PULMICORT) nebulizer solution  0.5 mg Nebulization BID  . calcium citrate  200 mg of elemental calcium Oral Daily  . carvedilol  3.125 mg Oral BID WC  . cholecalciferol  4,000 Units Oral Daily  . ezetimibe  10 mg Oral Daily  . furosemide  40 mg Intravenous BID  . isosorbide mononitrate  60 mg Oral Daily  . latanoprost  1 drop Both Eyes QHS  . levothyroxine  75 mcg Oral QAC breakfast  . multivitamin with minerals  1 tablet Oral Daily  . NIFEdipine  30 mg Oral BID  . omega-3 acid ethyl esters   Oral BID  . sodium chloride flush  3 mL Intravenous Q12H  . timolol  1 drop Both Eyes BID   Continuous Infusions: . heparin 1,050 Units/hr (05/01/17 1401)   PRN Meds: acetaminophen, ALPRAZolam, ipratropium-albuterol, nitroGLYCERIN, nitroGLYCERIN, ondansetron (ZOFRAN) IV, phenol, traMADol   Vital Signs    Vitals:   05/02/17 0236 05/02/17 0525 05/02/17 0750 05/02/17 0911  BP:  (!) 101/47  (!) 109/58  Pulse: 82 81  83  Resp: 16   18  Temp:  (!) 97.5 F (36.4 C)  98 F (36.7 C)  TempSrc:  Oral  Oral  SpO2: 92% 94% 93% 97%  Weight:  142 lb 8 oz (64.6 kg)    Height:        Intake/Output Summary (Last 24 hours) at 05/02/2017 1039 Last data filed at 05/02/2017 1006 Gross per 24 hour  Intake 36 ml  Output 1150 ml  Net -1114 ml   Filed Weights   04/30/17 2032 05/01/17 0255 05/02/17 0525  Weight: 143 lb 14.4 oz (65.3 kg) 143 lb (64.9 kg) 142 lb 8 oz (64.6 kg)    Physical Exam    Constitutional:  oriented to person, place, and time. No distress.  HENT:  Head: Normocephalic and atraumatic.  Eyes:  no discharge. No scleral icterus.  Neck: Normal range of motion. Neck supple. 12+  JVD present.  Cardiovascular: Normal rate, regular rhythm, normal heart sounds and intact distal pulses. Exam reveals no gallop and no friction rub. No edema No murmur heard. Pulmonary/Chest: Effort normal and breath sounds normal. No stridor. No respiratory distress.  no wheezes.  no rales.  no tenderness.  Abdominal: Soft.  no distension.  no tenderness.  Musculoskeletal: Normal range of motion.  no  tenderness or deformity.  Neurological:  normal muscle tone. Coordination normal. No atrophy Skin: Skin is warm and dry. No rash noted. not diaphoretic.  Psychiatric:  normal mood and affect. behavior is normal. Thought content normal.      Labs    Chemistry Recent Labs  Lab 04/30/17 1447 05/01/17 0413 05/02/17 0638  NA 134* 135 136  K 5.0 4.7 4.3  CL 102 103 106  CO2 17* 20* 18*  GLUCOSE 163* 129* 200*  BUN 94* 106* 105*  CREATININE 3.73* 3.70* 2.49*  CALCIUM 8.5* 8.2* 8.6*  GFRNONAA 13* 14* 22*  GFRAA 16* 16* 25*  ANIONGAP 15 12 12      Hematology Recent Labs  Lab 04/30/17 1447 05/01/17 0413 05/02/17 0638  WBC 9.9 7.4 6.1  RBC 3.62* 3.36* 3.44*  HGB 11.0* 10.4* 10.7*  HCT 33.2* 30.8* 31.0*  MCV 91.9 91.6 90.0  MCH 30.4 30.9 31.1  MCHC 33.0 33.7 34.6  RDW 15.3* 15.1* 15.0*  PLT 264 234 278    Cardiac Enzymes Recent Labs  Lab 04/30/17 1447 04/30/17 2108 05/01/17 0219 05/01/17 0810  TROPONINI 9.24* 12.08* 10.60* 6.92*   BNP Recent Labs  Lab 04/30/17 1741 04/30/17 2108  BNP 4,309.0* 4,115.0*      Radiology    Dg Chest 2 View  Result Date: 04/30/2017 CLINICAL DATA:  Shortness of breath. History of coronary artery disease and CABG, pulmonary hypertension, chronic renal insufficiency, peripheral vascular disease, former smoker. EXAM: CHEST  2 VIEW  COMPARISON:  PA and lateral chest x-ray of Jul 22, 2016 FINDINGS: The lungs are adequately inflated. There is a moderate-sized right pleural effusion and small left pleural effusion. The cardiac silhouette is mildly enlarged. The central pulmonary vascularity is engorged. There are post median sternotomy changes. There is calcification in the wall of the aortic arch and descending thoracic aorta. The observed bony thorax exhibits no acute abnormality. The patient has undergone previous posterior fusion in the upper lumbar spine. IMPRESSION: CHF with mild interstitial edema and bilateral pleural effusions new since the previous study. There is underlying chronic bronchitic change. Thoracic aortic atherosclerosis. Electronically Signed   By: David  Martinique M.D.   On: 04/30/2017 14:56   US Renal  Result Date: 05/01/2017 CLINICAL DATA:  Acute renal failure EXAM: RENAL / URINARY TRACT ULTRASOUND COMPLETE COMPARISON:  None. FINDINGS: Right Kidney: Length: 11.7 cm. Echogenicity and renal cortical thickness are within normal limits. No mass, perinephric fluid, or hydronephrosis visualized. No sonographically demonstrable calculus or ureterectasis. Left Kidney: Length: 7.7 cm. Echogenicity is increased. There is renal cortical thinning on the left. No perinephric fluid or hydronephrosis visualized. There is a cyst arising from the upper pole left kidney measuring 0.7 x 0.7 x 0.9 cm. There is a calculus in the lower pole left kidney measuring 6 mm. No ureterectasis. Bladder: Appears normal for degree of bladder distention. Incidental note is made of a left pleural effusion. IMPRESSION: 1. Left kidney is small with increased echogenicity and decreased cortical thickness consistent with atrophy. Small cyst upper pole left kidney. Nonobstructing 6 mm calculus lower pole left kidney. Etiology for the atrophy uncertain. It is possible that this atrophy is due to chronic scarring. It is also possible that there is renal artery  stenosis on the left. In this regard, question whether patient is hypertensive. 2.  Right kidney appears normal. 3.  Incidental note is made of left pleural effusion. Electronically Signed   By: Lowella Grip III M.D.   On: 05/01/2017 09:19   US Venous Img Lower Bilateral  Result Date: 04/30/2017 CLINICAL DATA:  Asymmetric leg swelling, worse on the right EXAM: BILATERAL LOWER EXTREMITY VENOUS DOPPLER ULTRASOUND TECHNIQUE: Gray-scale sonography with graded compression, as well as color Doppler and duplex ultrasound were performed to evaluate the lower extremity deep venous systems from the level of the common femoral vein and including the common femoral, femoral, profunda femoral, popliteal and calf veins including the posterior tibial, peroneal and gastrocnemius veins when visible. The superficial great saphenous vein was also interrogated. Spectral Doppler was utilized to evaluate flow at rest and with distal augmentation maneuvers in the common femoral, femoral and  popliteal veins. COMPARISON:  None. FINDINGS: RIGHT LOWER EXTREMITY Common Femoral Vein: No evidence of thrombus. Normal compressibility, respiratory phasicity and response to augmentation. Saphenofemoral Junction: No evidence of thrombus. Normal compressibility and flow on color Doppler imaging. Profunda Femoral Vein: No evidence of thrombus. Normal compressibility and flow on color Doppler imaging. Femoral Vein: No evidence of thrombus. Normal compressibility, respiratory phasicity and response to augmentation. Popliteal Vein: No evidence of thrombus. Normal compressibility, respiratory phasicity and response to augmentation. Calf Veins: No evidence of thrombus. Normal compressibility and flow on color Doppler imaging. Superficial Great Saphenous Vein: No evidence of thrombus. Normal compressibility. Venous Reflux:  None. Other Findings:  None. LEFT LOWER EXTREMITY Common Femoral Vein: No evidence of thrombus. Normal compressibility,  respiratory phasicity and response to augmentation. Saphenofemoral Junction: No evidence of thrombus. Normal compressibility and flow on color Doppler imaging. Profunda Femoral Vein: No evidence of thrombus. Normal compressibility and flow on color Doppler imaging. Femoral Vein: No evidence of thrombus. Normal compressibility, respiratory phasicity and response to augmentation. Popliteal Vein: No evidence of thrombus. Normal compressibility, respiratory phasicity and response to augmentation. Calf Veins: No evidence of thrombus. Normal compressibility and flow on color Doppler imaging. Superficial Great Saphenous Vein: No evidence of thrombus. Normal compressibility. Venous Reflux:  None. Other Findings:  None. IMPRESSION: No evidence of deep venous thrombosis. Electronically Signed   By: Jerilynn Mages.  Shick M.D.   On: 04/30/2017 15:54   Dg Chest Port 1 View  Result Date: 05/01/2017 CLINICAL DATA:  Short of breath EXAM: PORTABLE CHEST 1 VIEW COMPARISON:  04/30/2017, 07/22/2016 FINDINGS: Post sternotomy changes. Cardiomegaly with vascular congestion. Development of diffuse interstitial and airspace disease suspicious for pulmonary edema with more confluent airspace disease at the bases. Tiny pleural effusions. Aortic atherosclerosis. No pneumothorax. Surgical clips at the right neck IMPRESSION: 1. Cardiomegaly with vascular congestion and worsening interstitial and alveolar opacity most consistent with pulmonary edema. More confluent opacity at the lung bases makes it difficult to exclude superimposed pneumonia 2. Tiny pleural effusions. Electronically Signed   By: Donavan Foil M.D.   On: 05/01/2017 17:10    Telemetry    RSR - Personally Reviewed  Cardiac Studies   11.21.2018 2D Echocardiogram   - Left ventricle: Wall thickness was increased in a pattern of mild   LVH. Systolic function was normal. The estimated ejection   fraction was in the range of 55% to 60%. Wall motion was normal;   there were no  regional wall motion abnormalities. Features are   consistent with a pseudonormal left ventricular filling pattern,   with concomitant abnormal relaxation and increased filling   pressure (grade 2 diastolic dysfunction). Doppler parameters are   consistent with high ventricular filling pressure. - Aortic valve: Trileaflet; moderately thickened, mildly calcified   leaflets. Transvalvular velocity was increased. There was   moderate stenosis. There was moderate regurgitation. Mean   gradient (S): 17 mm Hg. VTI ratio of LVOT to aortic valve: 0.34.   Valve area (VTI): 1.07 cm^2. Mean velocity ratio of LVOT to   aortic valve: 0.31. - Mitral valve: Calcified annulus. Moderately thickened leaflets .   There was mild regurgitation. - Left atrium: The atrium was mildly dilated. - Right ventricle: The cavity size was normal. Systolic function   was mildly reduced. _____________   Cardiac catheterization in December 2018 that showed severe underlying heavily calcified three-vessel coronary artery disease with patent LIMA to LAD and SVG to distal RCA. SVG to OM was likely occluded but could not be selectively  engaged from the left radial artery. The patient was treated medically  _____________   Patient Profile     84 y.o. male w/ a h/o CAD s/p CABG in 2003, PAH, mod AS/AI, carotid dzs s/p R CEA, PAD, HTN, HL, DVT on chronic xarelto, CKD III, prostate CA, sciatica, and spinal stenosis, who was admitted from the office on 2/25 and found to have significant volume overload, AKI, and NSTEMI.  Assessment & Plan    1.  NSTEMI/CAD:   Not a good candidate for cardiac catheterization given markedly elevated creatinine on arrival greater than 3 well above his baseline -Seen by interventionals,   Trop initially 9.24 and peaked @ 12.08.  -Numbers trending down.  Agree with medical management at this time.  Low ejection fraction with wall motion abnormality,  --Will need to monitor creatinine and if this does  markedly improve potentially could do cardiac catheterization at a later date No anginal symptoms  2.  Acute on chronic diastolic CHF:   In setting of PAH and AS/AI.   Lasix 40 IV twice daily, did well with 3 times daily yesterday Still on high flow nasal cannula oxygen severely elevated right heart pressures on echo 70-75 Would continue aggressive diuresis  3.  Acute on chronic stage III kidney dzs:   Creatinine improving with diuresis 2.5  Nephrology consult pending.    4.  Essential HTN:   BP soft.  Will back down on procardia dose. Potentially could decrease isosorbide down to 30 mg daily  5.  HL:   Cont statin/zetia.  LDL 58.  6.  H/o DVT:   On chronic xarelto  on hold - currently on heparin.  Pending renal fxn recovery, may need to consider alternate OAC/eliquis   Total encounter time more than 35 minutes  Greater than 50% was spent in counseling and coordination of care with the patient  Signed, Ida Rogue, MD  05/02/2017, 10:39 AM    For questions or updates, please contact   Please consult [email or web address] for contact info under Cardiology/STEMI.

## 2017-05-02 NOTE — Progress Notes (Signed)
[facility]  ROUNDING NOTE   Subjective:   Wife at bedside.   Placed on HFNC  Feeling better  Objective:  Vital signs in last 24 hours:  Temp:  [97.5 F (36.4 C)-98.9 F (37.2 C)] 98.1 F (36.7 C) (02/27 1742) Pulse Rate:  [81-88] 85 (02/27 1742) Resp:  [16-18] 18 (02/27 1742) BP: (101-109)/(47-58) 105/50 (02/27 1742) SpO2:  [90 %-97 %] 92 % (02/27 1742) FiO2 (%):  [55 %-56 %] 56 % (02/27 0750) Weight:  [64.6 kg (142 lb 8 oz)] 64.6 kg (142 lb 8 oz) (02/27 0525)  Weight change: -2.041 kg (-8 oz) Filed Weights   04/30/17 2032 05/01/17 0255 05/02/17 0525  Weight: 65.3 kg (143 lb 14.4 oz) 64.9 kg (143 lb) 64.6 kg (142 lb 8 oz)    Intake/Output: I/O last 3 completed shifts: In: 77 [I.V.:36] Out: 1050 [Urine:1050]   Intake/Output this shift:  Total I/O In: -  Out: 900 [Urine:900]  Physical Exam: General: NAD,   Head: Normocephalic, atraumatic. Moist oral mucosal membranes  Eyes: Anicteric, PERRL  Neck: Supple, trachea midline  Lungs:  Clear to auscultation  Heart: Regular rate and rhythm, +murmur  Abdomen:  Soft, nontender,   Extremities: no peripheral edema.  Neurologic: Nonfocal, moving all four extremities  Skin: No lesions        Basic Metabolic Panel: Recent Labs  Lab 04/30/17 1447 05/01/17 0413 05/02/17 0638  NA 134* 135 136  K 5.0 4.7 4.3  CL 102 103 106  CO2 17* 20* 18*  GLUCOSE 163* 129* 200*  BUN 94* 106* 105*  CREATININE 3.73* 3.70* 2.49*  CALCIUM 8.5* 8.2* 8.6*    Liver Function Tests: No results for input(s): AST, ALT, ALKPHOS, BILITOT, PROT, ALBUMIN in the last 168 hours. No results for input(s): LIPASE, AMYLASE in the last 168 hours. No results for input(s): AMMONIA in the last 168 hours.  CBC: Recent Labs  Lab 04/30/17 1447 05/01/17 0413 05/02/17 0638  WBC 9.9 7.4 6.1  HGB 11.0* 10.4* 10.7*  HCT 33.2* 30.8* 31.0*  MCV 91.9 91.6 90.0  PLT 264 234 278    Cardiac Enzymes: Recent Labs  Lab 04/30/17 1447  04/30/17 2108 05/01/17 0219 05/01/17 0810  TROPONINI 9.24* 12.08* 10.60* 6.92*    BNP: Invalid input(s): POCBNP  CBG: No results for input(s): GLUCAP in the last 168 hours.  Microbiology: No results found for this or any previous visit.  Coagulation Studies: Recent Labs    04/30/17 1741  LABPROT 18.0*  INR 1.50    Urinalysis: No results for input(s): COLORURINE, LABSPEC, PHURINE, GLUCOSEU, HGBUR, BILIRUBINUR, KETONESUR, PROTEINUR, UROBILINOGEN, NITRITE, LEUKOCYTESUR in the last 72 hours.  Invalid input(s): APPERANCEUR    Imaging: US Renal  Result Date: 05/01/2017 CLINICAL DATA:  Acute renal failure EXAM: RENAL / URINARY TRACT ULTRASOUND COMPLETE COMPARISON:  None. FINDINGS: Right Kidney: Length: 11.7 cm. Echogenicity and renal cortical thickness are within normal limits. No mass, perinephric fluid, or hydronephrosis visualized. No sonographically demonstrable calculus or ureterectasis. Left Kidney: Length: 7.7 cm. Echogenicity is increased. There is renal cortical thinning on the left. No perinephric fluid or hydronephrosis visualized. There is a cyst arising from the upper pole left kidney measuring 0.7 x 0.7 x 0.9 cm. There is a calculus in the lower pole left kidney measuring 6 mm. No ureterectasis. Bladder: Appears normal for degree of bladder distention. Incidental note is made of a left pleural effusion. IMPRESSION: 1. Left kidney is small with increased echogenicity and decreased cortical thickness consistent with  atrophy. Small cyst upper pole left kidney. Nonobstructing 6 mm calculus lower pole left kidney. Etiology for the atrophy uncertain. It is possible that this atrophy is due to chronic scarring. It is also possible that there is renal artery stenosis on the left. In this regard, question whether patient is hypertensive. 2.  Right kidney appears normal. 3.  Incidental note is made of left pleural effusion. Electronically Signed   By: Lowella Grip III M.D.   On:  05/01/2017 09:19   Dg Chest Port 1 View  Result Date: 05/01/2017 CLINICAL DATA:  Short of breath EXAM: PORTABLE CHEST 1 VIEW COMPARISON:  04/30/2017, 07/22/2016 FINDINGS: Post sternotomy changes. Cardiomegaly with vascular congestion. Development of diffuse interstitial and airspace disease suspicious for pulmonary edema with more confluent airspace disease at the bases. Tiny pleural effusions. Aortic atherosclerosis. No pneumothorax. Surgical clips at the right neck IMPRESSION: 1. Cardiomegaly with vascular congestion and worsening interstitial and alveolar opacity most consistent with pulmonary edema. More confluent opacity at the lung bases makes it difficult to exclude superimposed pneumonia 2. Tiny pleural effusions. Electronically Signed   By: Donavan Foil M.D.   On: 05/01/2017 17:10     Medications:   . heparin 1,050 Units/hr (05/01/17 1401)   . aspirin EC  81 mg Oral Daily  . atorvastatin  80 mg Oral Daily  . budesonide (PULMICORT) nebulizer solution  0.5 mg Nebulization BID  . calcium citrate  200 mg of elemental calcium Oral Daily  . carvedilol  3.125 mg Oral BID WC  . cholecalciferol  4,000 Units Oral Daily  . ezetimibe  10 mg Oral Daily  . furosemide  40 mg Intravenous Q8H  . isosorbide mononitrate  60 mg Oral Daily  . latanoprost  1 drop Both Eyes QHS  . levothyroxine  75 mcg Oral QAC breakfast  . multivitamin with minerals  1 tablet Oral Daily  . NIFEdipine  30 mg Oral BID  . omega-3 acid ethyl esters   Oral BID  . sodium chloride flush  3 mL Intravenous Q12H  . timolol  1 drop Both Eyes BID   acetaminophen, ALPRAZolam, ipratropium-albuterol, nitroGLYCERIN, nitroGLYCERIN, ondansetron (ZOFRAN) IV, phenol, traMADol  Assessment/ Plan:  [name] is a 84 y.o. white male with hypertension, coronary artery disease, history of DVT, glaucoma, history of prostate cancer, hyperlipidemia, hypothyroidism, aortic stenosis, diastolic congestive heart failure who is admitted  for acute coronary syndrome.   1. Acute renal failure on chronic kidney disease stage III: Baseline creatinine 1.48, GFR of 12/2016.  Acute renal failure from acute coronary syndrome and acute cardiorenal syndrome.  - Recommend renally dosing all medications. Appreciate cardiology input.  - holding valsartan  2. Hypertension: with acute diastolic congestive heart failure/aortic stenosis and coronary syndrome. Blood pressure is low. Echocardiogram pending - Continue furosemide - Continue cavedilol, imdur, nifedipine - Appreciate cardiology input.   3. Anemia of chronic kidney disease: hemoglobin 10.7. No indication for ESA    LOS: 2 Dennis Burns 2/27/20196:18 PM

## 2017-05-02 NOTE — Care Management (Signed)
Patient admitted with nstemi. He is currently requiring HFNC.  He is not on oxygen chronically.  Unable to proceed with cardiac cath at present due to elevated creatinine.  Nephrology to consult.

## 2017-05-03 DIAGNOSIS — I5021 Acute systolic (congestive) heart failure: Secondary | ICD-10-CM

## 2017-05-03 DIAGNOSIS — R0602 Shortness of breath: Secondary | ICD-10-CM

## 2017-05-03 DIAGNOSIS — N189 Chronic kidney disease, unspecified: Secondary | ICD-10-CM

## 2017-05-03 DIAGNOSIS — Z7189 Other specified counseling: Secondary | ICD-10-CM

## 2017-05-03 DIAGNOSIS — Z515 Encounter for palliative care: Secondary | ICD-10-CM

## 2017-05-03 LAB — CBC
HEMATOCRIT: 34.8 % — AB (ref 40.0–52.0)
Hemoglobin: 11.4 g/dL — ABNORMAL LOW (ref 13.0–18.0)
MCH: 30 pg (ref 26.0–34.0)
MCHC: 32.9 g/dL (ref 32.0–36.0)
MCV: 91.1 fL (ref 80.0–100.0)
Platelets: 359 10*3/uL (ref 150–440)
RBC: 3.81 MIL/uL — ABNORMAL LOW (ref 4.40–5.90)
RDW: 15.3 % — AB (ref 11.5–14.5)
WBC: 14.2 10*3/uL — AB (ref 3.8–10.6)

## 2017-05-03 LAB — BASIC METABOLIC PANEL
Anion gap: 15 (ref 5–15)
BUN: 94 mg/dL — AB (ref 6–20)
CO2: 17 mmol/L — ABNORMAL LOW (ref 22–32)
Calcium: 8.6 mg/dL — ABNORMAL LOW (ref 8.9–10.3)
Chloride: 104 mmol/L (ref 101–111)
Creatinine, Ser: 2.05 mg/dL — ABNORMAL HIGH (ref 0.61–1.24)
GFR calc Af Amer: 32 mL/min — ABNORMAL LOW (ref >60)
GFR, EST NON AFRICAN AMERICAN: 28 mL/min — AB (ref >60)
GLUCOSE: 196 mg/dL — AB (ref 65–99)
POTASSIUM: 5 mmol/L (ref 3.5–5.1)
Sodium: 136 mmol/L (ref 135–145)

## 2017-05-03 LAB — MRSA PCR SCREENING: MRSA by PCR: NEGATIVE

## 2017-05-03 LAB — GLUCOSE, CAPILLARY: Glucose-Capillary: 280 mg/dL — ABNORMAL HIGH (ref 65–99)

## 2017-05-03 LAB — HEPARIN LEVEL (UNFRACTIONATED): Heparin Unfractionated: 0.44 IU/mL (ref 0.30–0.70)

## 2017-05-03 MED ORDER — SODIUM CHLORIDE 0.9 % IV SOLN
1.0000 g | Freq: Every day | INTRAVENOUS | Status: DC
Start: 2017-05-03 — End: 2017-05-04
  Administered 2017-05-03 – 2017-05-04 (×2): 1 g via INTRAVENOUS
  Filled 2017-05-03 (×2): qty 1

## 2017-05-03 MED ORDER — CARVEDILOL 3.125 MG PO TABS
3.1250 mg | ORAL_TABLET | Freq: Once | ORAL | Status: AC
  Administered 2017-05-03: 3.125 mg via ORAL
  Filled 2017-05-03: qty 1

## 2017-05-03 MED ORDER — MORPHINE SULFATE (PF) 2 MG/ML IV SOLN
1.0000 mg | INTRAVENOUS | Status: DC | PRN
  Administered 2017-05-03 (×2): 2 mg via INTRAVENOUS
  Filled 2017-05-03: qty 1

## 2017-05-03 MED ORDER — BUDESONIDE 0.5 MG/2ML IN SUSP
0.5000 mg | Freq: Two times a day (BID) | RESPIRATORY_TRACT | Status: DC
  Administered 2017-05-03 – 2017-05-04 (×2): 0.5 mg via RESPIRATORY_TRACT
  Filled 2017-05-03 (×2): qty 2

## 2017-05-03 MED ORDER — MORPHINE SULFATE (PF) 2 MG/ML IV SOLN
INTRAVENOUS | Status: AC
  Filled 2017-05-03: qty 1

## 2017-05-03 MED ORDER — IPRATROPIUM-ALBUTEROL 0.5-2.5 (3) MG/3ML IN SOLN
3.0000 mL | RESPIRATORY_TRACT | Status: DC
  Administered 2017-05-03 – 2017-05-04 (×6): 3 mL via RESPIRATORY_TRACT
  Filled 2017-05-03 (×6): qty 3

## 2017-05-03 MED ORDER — CARVEDILOL 6.25 MG PO TABS
6.2500 mg | ORAL_TABLET | Freq: Two times a day (BID) | ORAL | Status: DC
  Administered 2017-05-03 – 2017-05-10 (×12): 6.25 mg via ORAL
  Filled 2017-05-03 (×13): qty 1

## 2017-05-03 MED ORDER — MORPHINE SULFATE (PF) 2 MG/ML IV SOLN
2.0000 mg | INTRAVENOUS | Status: DC | PRN
  Administered 2017-05-03 – 2017-05-10 (×12): 2 mg via INTRAVENOUS
  Filled 2017-05-03 (×15): qty 1

## 2017-05-03 NOTE — Progress Notes (Signed)
ANTICOAGULATION CONSULT NOTE - Initial Consult  Pharmacy Consult for heparin drip Indication: chest pain/ACS  No Known Allergies  Patient Measurements: Height: 5\' 3"  (160 cm) Weight: 138 lb 0.1 oz (62.6 kg) IBW/kg (Calculated) : 56.9 Heparin Dosing Weight: 67 kg  Vital Signs: Temp: 98.5 F (36.9 C) (02/28 0021) Temp Source: Oral (02/28 0021) BP: 107/65 (02/28 0500) Pulse Rate: 110 (02/28 0500)  Labs: Recent Labs    04/30/17 1741 04/30/17 2108 05/01/17 0219 05/01/17 0413 05/01/17 0810 05/01/17 1004 05/01/17 2248 05/02/17 0638 05/03/17 0402 05/03/17 0533  HGB  --   --   --  10.4*  --   --   --  10.7*  --  11.4*  HCT  --   --   --  30.8*  --   --   --  31.0*  --  34.8*  PLT  --   --   --  234  --   --   --  278  --  359  APTT 48*  --  50*  --   --  60* 66* 78*  --   --   LABPROT 18.0*  --   --   --   --   --   --   --   --   --   INR 1.50  --   --   --   --   --   --   --   --   --   HEPARINUNFRC 2.04*  --  1.44*  --   --  1.40*  --  0.53  --  0.44  CREATININE  --   --   --  3.70*  --   --   --  2.49* 2.05*  --   TROPONINI  --  12.08* 10.60*  --  6.92*  --   --   --   --   --     Estimated Creatinine Clearance: 20.8 mL/min (A) (by C-G formula based on SCr of 2.05 mg/dL (H)).   Medical History: Past Medical History:  Diagnosis Date  . Allergic rhinitis due to pollen    as child--better now  . Chronic kidney disease, stage III (moderate) (HCC)   . Coronary artery disease   . DVT, lower extremity, recurrent ([place])   . Glaucoma    [facility]   . History of prostate cancer 2004  . Hyperlipidemia   . Hypertension   . Hypothyroidism   . Impaired fasting glucose   . Spinal stenosis of lumbar region with radiculopathy     Assessment: Pharmacy consulted to dose and monitor heparin drip in this 84 year old man for ACS.  Patient was taking rivaroxaban PTA and reports last dose taken on 2/24 around 2000.   Baseline labs ordered including baseline heparin level. If  baseline heparin level is elevated, will need to dose/monitor using  APTT until levels correlate.  Goal of Therapy:  APTT 66-102 seconds Heparin level 0.3-0.7 units/ml Monitor platelets by anticoagulation protocol: Yes   Plan:  No bolus as patient is currently anticoagulated on rivaroxaban Will start heparin 800 units/hr 24 hours after last rivaroxaban dose per protocol Will check HL and APTT in 8 hours and CBC daily.  02/26 @ 0200 HL 1.44, aPTT 50, HL supratherapeutic (but trending down), aPTT subtherapeutic. Both not correlating, will rebolus w/ heparin 1000 units IV x 1 and increase rate to 900 units/hr. Will recheck both HL/aPTT @ 1000, will continue to dose off of aPTT  until both HL/aPTT correlate then will go back to dosing off of HL.   02/26 @ 1400: aPTT 60, will increase heparin infusion to 1050 units/hr and recheck aPTT in 8 hours.   02/26 @ 2248 aPTT 66 therapeutic. Will continue current rate and recheck HL/aPTT @ 0700.  02/27 @ 0815: aPTT 78, HL 0.53. Heparin Level and aPTT now correlate and both are in range. Continue heparin infusion at 1050 unit/hr. No need to further check aPTT. Next HL/CBC in AM.   02/28 @ 0533 HL 0.44 therapeutic. Will continue current rate and will recheck w/ am labs w/ CBC.  Tobie Lords, PharmD, BCPS Clinical Pharmacist 05/03/2017,6:38 AM

## 2017-05-03 NOTE — Progress Notes (Signed)
Patient is complaining of unreleived chest pain stretching across his chest and SOB. Pain was not relieved by tramadol at 2000. Notified Dr.Maier of his current condition. Orders received for morphine.

## 2017-05-03 NOTE — Progress Notes (Signed)
[place] at [place] NAME: Dennis Burns    MR#:  [id number]  DATE OF BIRTH:  1928-12-04  SUBJECTIVE:   Patient to ensure to ICU due to increased respiratory effort. He is currently on BiPAP  REVIEW OF SYSTEMS:    Review of Systems  Constitutional: Negative for fever, chills weight loss HENT: Negative for ear pain, nosebleeds, congestion, facial swelling, rhinorrhea, neck pain, neck stiffness and ear discharge.   Respiratory: Negative for cough, ++shortness of breath, NO wheezing  Cardiovascular: Negative for chest pain, palpitations and leg swelling.  Gastrointestinal: Negative for heartburn, abdominal pain, vomiting, diarrhea or consitpation Genitourinary: Negative for dysuria, urgency, frequency, hematuria Musculoskeletal: Negative for back pain or joint pain Neurological: Negative for dizziness, seizures, syncope, focal weakness,  numbness and headaches.  Hematological: Does not bruise/bleed easily.  Psychiatric/Behavioral: Negative for hallucinations, confusion, dysphoric mood    Tolerating Diet: yes      DRUG ALLERGIES:  No Known Allergies  VITALS:  Blood pressure 119/69, pulse (!) 115, temperature (!) 100.5 F (38.1 C), temperature source Axillary, resp. rate 19, height 5\' 3"  (1.6 m), weight 62.6 kg (138 lb 0.1 oz), SpO2 94 %.  PHYSICAL EXAMINATION:  Constitutional: Appears well-developed and well-nourished. No distress. HENT: Normocephalic. Patient on BiPAP Eyes: Conjunctivae and EOM are normal. PERRLA, no scleral icterus.  Neck: Normal ROM. Neck supple. ++JVD. No tracheal deviation. CVS: Tachycardia S1/S2 +, 2/6 SEM,  no gallops, no carotid bruit.  Pulmonary: Effort and breath sounds normal, no stridor, rhonchi, wheezes, rales.  Abdominal: Soft. BS +,  no distension, tenderness, rebound or guarding.  Musculoskeletal: Normal range of motion. No edema and no tenderness.  Neuro: Alert. CN 2-12 grossly intact. No focal  deficits. Skin: Skin is warm and dry. No rash noted. Psychiatric: Normal mood and affect.      LABORATORY PANEL:   CBC Recent Labs  Lab 05/03/17 0533  WBC 14.2*  HGB 11.4*  HCT 34.8*  PLT 359   ------------------------------------------------------------------------------------------------------------------  Chemistries  Recent Labs  Lab 05/03/17 0402  NA 136  K 5.0  CL 104  CO2 17*  GLUCOSE 196*  BUN 94*  CREATININE 2.05*  CALCIUM 8.6*   ------------------------------------------------------------------------------------------------------------------  Cardiac Enzymes Recent Labs  Lab 04/30/17 2108 05/01/17 0219 05/01/17 0810  TROPONINI 12.08* 10.60* 6.92*   ------------------------------------------------------------------------------------------------------------------  RADIOLOGY:  Dg Chest Port 1 View  Result Date: 05/01/2017 CLINICAL DATA:  Short of breath EXAM: PORTABLE CHEST 1 VIEW COMPARISON:  04/30/2017, 07/22/2016 FINDINGS: Post sternotomy changes. Cardiomegaly with vascular congestion. Development of diffuse interstitial and airspace disease suspicious for pulmonary edema with more confluent airspace disease at the bases. Tiny pleural effusions. Aortic atherosclerosis. No pneumothorax. Surgical clips at the right neck IMPRESSION: 1. Cardiomegaly with vascular congestion and worsening interstitial and alveolar opacity most consistent with pulmonary edema. More confluent opacity at the lung bases makes it difficult to exclude superimposed pneumonia 2. Tiny pleural effusions. Electronically Signed   By: Donavan Foil M.D.   On: 05/01/2017 17:10     ASSESSMENT AND PLAN:  84 year old male with history of CAD status post CABG, PAD, hypertension , chronic kidney disease stage III and DVT on anticoagulation who presented due to shortness of breath.  1. Non-ST elevation MI: Troponin max is 12.08 Due to acute on chronic kidney disease he is currently not a  candidate for cardiac catheterization Continue aspirin, statin, Coreg Continue heparin drip Cardiology consultation appreciated Echocardiogram shows ejection fraction 35-40% with probable hypokinesis  of the apical anterior,lateral, and apical myocardium. Probable hypokinesis of the basal-midinferolateral myocardium Possible cardiac catheterization at a later date as recommended by cardiology  2. Acute hypoxic respiratory failure due to Acute on chronic combined systolic and diastolic heart failure with ejection fraction 35-40%  heart failure and pulmonary hypertension:  Continue BiPAP and titrate as per intensivist Continue aggressive IV Lasix Monitor intake and output daily weight Wean high flow O2 nasal cannula as tolerated Echo shows elevated right heart pressure.  3. Hyperlipidemia: Continues Zetia and Lipitor  4. Hypothyroidism: Continue Synthroid  5. Essential hypertension: Continue Coreg and nifedipine and isosorbide 6. Acute on chronic kidney disease stage III: Nephrology consultation appreciated acute kidney injury in the setting of cardiorenal syndrome  Creatinine improving with diuresis   Renal ultrasound reviewed, no obstruction seen.   7. History of DVT: Currently on heparin xarelto on hold due to this Dopplers were negative for acute DVT    Management plans discussed with the patient and wife and they are in agreement.  CODE STATUS: full  TOTAL TIME TAKING CARE OF THIS PATIENT: 25 minutes.   D/w dr Abigail Butts and dr [name]  POSSIBLE D/C 3-4 days, DEPENDING ON CLINICAL CONDITION.   [name] M.D on 05/03/2017 at 12:35 PM  Between 7am to 6pm - Pager - [phone] After 6pm go to [email or web address] - password [email or web address] [facility]  Office  [phone]  CC: Primary care physician; [name], MD  Note: This dictation was prepared with Dragon dictation along with smaller phrase technology. Any transcriptional errors that result from  this process are unintentional.

## 2017-05-03 NOTE — Progress Notes (Signed)
Notified wife that patient was being moved to ICU to be placed on bipap.

## 2017-05-03 NOTE — Progress Notes (Signed)
Progress Note  Patient Name: Dennis Burns Date of Encounter: 05/03/2017  Primary Cardiologist: Ida Rogue, MD  Subjective   Respiratory distress overnight rest,  Requiring BiPAP, Chest pain, treated with tramadol by nursing overnight, resolved This morning in the ICU, comfortable, tachycardic, sinus tach, worse with movement With some urine retention, 400-500 in his bladder, Foley being placed   Increase WBC 14, ?PNA  Inpatient Medications    Scheduled Meds: . aspirin EC  81 mg Oral Daily  . atorvastatin  80 mg Oral Daily  . budesonide (PULMICORT) nebulizer solution  0.5 mg Nebulization BID  . calcium citrate  200 mg of elemental calcium Oral Daily  . carvedilol  3.125 mg Oral Once  . carvedilol  6.25 mg Oral BID WC  . cholecalciferol  4,000 Units Oral Daily  . ezetimibe  10 mg Oral Daily  . furosemide  40 mg Intravenous Q8H  . ipratropium-albuterol  3 mL Nebulization Q4H  . isosorbide mononitrate  60 mg Oral Daily  . latanoprost  1 drop Both Eyes QHS  . levothyroxine  75 mcg Oral QAC breakfast  . morphine      . multivitamin with minerals  1 tablet Oral Daily  . NIFEdipine  30 mg Oral BID  . omega-3 acid ethyl esters   Oral BID  . sodium chloride flush  3 mL Intravenous Q12H  . timolol  1 drop Both Eyes BID   Continuous Infusions: . ceFEPime (MAXIPIME) IV Stopped (05/03/17 1056)  . heparin 1,050 Units/hr (05/03/17 0500)   PRN Meds: acetaminophen, ALPRAZolam, ipratropium-albuterol, morphine injection, nitroGLYCERIN, nitroGLYCERIN, ondansetron (ZOFRAN) IV, phenol, traMADol   Vital Signs    Vitals:   05/03/17 1000 05/03/17 1030 05/03/17 1100 05/03/17 1107  BP: 132/72  132/71   Pulse: (!) 115 (!) 113 (!) 126 (!) 125  Resp: (!) 28 (!) 30 (!) 37 (!) 27  Temp:      TempSrc:      SpO2: 94% 94% 90% 93%  Weight:      Height:        Intake/Output Summary (Last 24 hours) at 05/03/2017 1143 Last data filed at 05/03/2017 1026 Gross per 24 hour  Intake 593.53  ml  Output 900 ml  Net -306.47 ml   Filed Weights   05/01/17 0255 05/02/17 0525 05/03/17 0500  Weight: 143 lb (64.9 kg) 142 lb 8 oz (64.6 kg) 138 lb 0.1 oz (62.6 kg)    Physical Exam   Constitutional:  oriented to person, place, and time.  Moderate respiratory distress, on BiPAP HENT:  Head: Normocephalic and atraumatic.  Eyes:  no discharge. No scleral icterus.  Neck: Normal range of motion. Neck supple. 12+  JVD present.  Cardiovascular: Regular, tachycardic , normal heart sounds and intact distal pulses. Exam reveals no gallop and no friction rub. No edema No murmur heard. Pulmonary/Chest: Effort normal and breath sounds normal. No stridor. No respiratory distress.  no wheezes.  no rales.  no tenderness.  Abdominal: Soft.  no distension.  no tenderness.  Musculoskeletal: Normal range of motion.  no  tenderness or deformity.  Neurological:  normal muscle tone. Coordination normal. No atrophy Skin: Skin is warm and dry. No rash noted. not diaphoretic.  Psychiatric:  normal mood and affect. behavior is normal. Thought content normal.      Labs    Chemistry Recent Labs  Lab 05/01/17 0413 05/02/17 0638 05/03/17 0402  NA 135 136 136  K 4.7 4.3 5.0  CL 103 106 104  CO2 20* 18* 17*  GLUCOSE 129* 200* 196*  BUN 106* 105* 94*  CREATININE 3.70* 2.49* 2.05*  CALCIUM 8.2* 8.6* 8.6*  GFRNONAA 14* 22* 28*  GFRAA 16* 25* 32*  ANIONGAP 12 12 15      Hematology Recent Labs  Lab 05/01/17 0413 05/02/17 0638 05/03/17 0533  WBC 7.4 6.1 14.2*  RBC 3.36* 3.44* 3.81*  HGB 10.4* 10.7* 11.4*  HCT 30.8* 31.0* 34.8*  MCV 91.6 90.0 91.1  MCH 30.9 31.1 30.0  MCHC 33.7 34.6 32.9  RDW 15.1* 15.0* 15.3*  PLT 234 278 359    Cardiac Enzymes Recent Labs  Lab 04/30/17 1447 04/30/17 2108 05/01/17 0219 05/01/17 0810  TROPONINI 9.24* 12.08* 10.60* 6.92*   BNP Recent Labs  Lab 04/30/17 1741 04/30/17 2108  BNP 4,309.0* 4,115.0*      Radiology    Dg Chest Port 1  View  Result Date: 05/01/2017 CLINICAL DATA:  Short of breath EXAM: PORTABLE CHEST 1 VIEW COMPARISON:  04/30/2017, 07/22/2016 FINDINGS: Post sternotomy changes. Cardiomegaly with vascular congestion. Development of diffuse interstitial and airspace disease suspicious for pulmonary edema with more confluent airspace disease at the bases. Tiny pleural effusions. Aortic atherosclerosis. No pneumothorax. Surgical clips at the right neck IMPRESSION: 1. Cardiomegaly with vascular congestion and worsening interstitial and alveolar opacity most consistent with pulmonary edema. More confluent opacity at the lung bases makes it difficult to exclude superimposed pneumonia 2. Tiny pleural effusions. Electronically Signed   By: Donavan Foil M.D.   On: 05/01/2017 17:10    Telemetry    RSR - Personally Reviewed  Cardiac Studies   Left ventricle: The cavity size was normal. Wall thickness was   increased in a pattern of mild LVH. Systolic function was   moderately reduced. The estimated ejection fraction was in the   range of 35% to 40%. Probable hypokinesis of the apicalanterior,   lateral, and apical myocardium. Probable hypokinesis of the   basal-midinferolateral myocardium. Doppler parameters are   consistent with restrictive physiology, indicative of decreased   left ventricular diastolic compliance and/or increased left   atrial pressure. Doppler parameters are consistent with high   ventricular filling pressure. - Aortic valve: Valve mobility was restricted. Transvalvular   velocity was increased. There was likely moderate stenosis;   degree of stenosis/transvavular gradient may be underestimated by   low LVEF. There was moderate regurgitation. - Mitral valve: Calcified annulus. Mildly thickened leaflets .   There was moderate to severe regurgitation. - Left atrium: The atrium was mildly dilated. - Right ventricle: Systolic function was mildly reduced. - Tricuspid valve: There was moderate  regurgitation. - Pulmonary arteries: Systolic pressure was severely increased, in   the range of 70 mm Hg to 75 mm Hg. - Pericardium, extracardiac: There was a left pleural effusion. _____________   Cardiac catheterization in December 2018 that showed severe underlying heavily calcified three-vessel coronary artery disease with patent LIMA to LAD and SVG to distal RCA. SVG to OM was likely occluded but could not be selectively engaged from the left radial artery. The patient was treated medically  _____________   Patient Profile     84 y.o. male w/ a h/o CAD s/p CABG in 2003, PAH, mod AS/AI, carotid dzs s/p R CEA, PAD, HTN, HL, DVT on chronic xarelto, CKD III, prostate CA, sciatica, and spinal stenosis, who was admitted from the office on 2/25 and found to have significant volume overload, AKI, and NSTEMI.  Assessment & Plan    1.  NSTEMI/CAD:  Trop 12.08.  Poor candidate for cardiac catheterization on arrival given worsening renal function Creatinine now down to 2.0 Respiratory distress on BiPAP Will discuss with Dr. Fletcher Anon, rounding tomorrow  could do cardiac catheterization at a later date Episode of chest pain last night, etiology unclear  2.  Acute on chronic diastolic CHF:   In setting of PAH and AS/AI.   Lasix 40 IV TID Creatinine continues to improve Still on high flow nasal cannula oxygen severely elevated right heart pressures on echo 70-75 Would continue aggressive diuresis --Consider medications for pulmonary hypertension.  We will have to hold Imdur, start revatio  3.  Acute on chronic stage III kidney dzs:   Creatinine improving with diuresis 2.0  Nephrology following  4.  Essential HTN:   isosorbide down to 30 mg daily Coreg up to 6.25 BID  5.  HL:   Cont statin/zetia.  LDL 58.  6.  H/o DVT:    On chronic xarelto  on hold - currently on heparin.    Consider palliative care consult given severe cardiac disease, renal disease, pulmonary disease.  Case  discussed with pulmonary service, patient's wife  Total encounter time more than 35 minutes  Greater than 50% was spent in counseling and coordination of care with the patient  Signed, Ida Rogue, MD  05/03/2017, 11:43 AM    For questions or updates, please contact   Please consult [email or web address] for contact info under Cardiology/STEMI.

## 2017-05-03 NOTE — Progress Notes (Signed)
RT called to room for pt SOB with significant increase in WOB, pt sat 84% hr 115 rr 45 on HFNC 35L 55% increased flow to 45L and FiO2 to 65% with no decrease in WOB. Placed on BiPAP per order with positive outcome, RR decreased to low 30's sat mid-low 90's hr remains in low 100's. Transported pt to unit on BiPAP.

## 2017-05-03 NOTE — Consult Note (Signed)
Consultation Note Date: 05/03/2017   Patient Name: Dennis Burns  DOB: August 20, 1928  MRN: [id number]  Age / Sex: 84 y.o., male  PCP: [name], MD Referring Physician: Bettey Costa, MD  Reason for Consultation: Establishing goals of care  HPI/Patient Profile: Mr. Tenbrink is an 84 year old man with history of CAD status post CABG, presenting with shortness of breath and elevated troponin complicated by acute/subacute kidney injury.    Currently admitted for NSTEMI.   Clinical Assessment and Goals of Care: Patient resting in bed on BIPAP, wife at bedside. They currently live at [facility] in independent living. Velva Harman his wife states they chose that facility based on activities and their active lifestyle. The couple used to enjoy biking and Sonia walked 7 miles per day 10 years ago, prior to spinal fusion. His activity level has declined gradually over the years. In Novemeber 2018 they went on vacation to [place] and noticed he would need to stop about every block to rest. 3 months ago he began having chest pain and tightness requiring Isosorbide and Nitro. Since then, when going somewhere they have parked as close as possible to the entrance of where they were going because he breathing has become progressively worse.   We discussed diagnosis and prognosis.They have spoken with the doctors and state that they realize things will progressively become worse and want to be sure he is comfortable.    The difference between an aggressive medical intervention path and a hospice comfort care path for this patient at this time was discussed.  Values and goals of care important to patient and family were attempted to be elicited.  We discussed GOC and they state they want things that are reasonable. Upon further discussion they add that they have decided against a DNR. They are not sure about a ventilator or what  length of time he would want to trial a ventilator, as they state if he were on a ventilator, it would not allow him to speak to his family.  Ultimatelty, they state it is very soon since the admission and are waiting to see what his health and functional status will look like.       SUMMARY OF RECOMMENDATIONS    Will follow to discuss [place].     Code Status/Advance Care Planning:  Full code    Symptom Management:   Per primary team.  Palliative Prophylaxis:   Oral Care   Prognosis:   Poor.   Discharge Planning: To Be Determined      Primary Diagnoses: Present on Admission: . NSTEMI (non-ST elevated myocardial infarction) ([place])   I have reviewed the medical record, interviewed the patient and family, and examined the patient. The following aspects are pertinent.  Past Medical History:  Diagnosis Date  . Allergic rhinitis due to pollen    as child--better now  . Chronic kidney disease, stage III (moderate) (H[facility])   . Coronary artery disease   . DVT, lower extremity, recurrent ([place])   . Glaucoma    [facility]   . History of prostate cancer 2004  . Hyperlipidemia   . Hypertension   . Hypothyroidism   . Impaired fasting glucose   . Spinal stenosis of lumbar region with radiculopathy    Social History   Socioeconomic History  . Marital status: Married    Spouse name: None  . Number of children: 5  . Years of education: None  . Highest education level: None  Social Needs  . Financial resource strain: None  . Food insecurity - worry: None  . Food insecurity - inability: None  . Transportation needs - medical: None  . Transportation needs - non-medical: None  Occupational History  . 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 Smoking status: Former Smoker    Last attempt to quit: 1978    Years since quitting: 41.1  . Smokeless tobacco: Never Used  Substance and Sexual Activity  . Alcohol use: Yes  . Drug  use: No  . Sexual activity: None  Other Topics Concern  . None  Social History Narrative   2nd marriage--- 1980   5 children--2 step children   12 grandchildren   71 great grandchildren      Has living will   Wife is health care POA   Would accept resuscitation   Not sure about tube feeds   Family History  Problem Relation Age of Onset  . Cancer Mother        colon (cause of death) and breast  . Glaucoma Mother   . Cancer Father        colon  . Stroke Maternal Grandfather   . Diabetes Neg Hx    Scheduled Meds: . aspirin EC  81 mg Oral Daily  . atorvastatin  80 mg Oral Daily  . budesonide (PULMICORT) nebulizer solution  0.5 mg Nebulization BID  . calcium citrate  200 mg of elemental calcium Oral Daily  . carvedilol  6.25 mg Oral BID WC  . cholecalciferol  4,000 Units Oral Daily  . ezetimibe  10 mg Oral Daily  . furosemide  40 mg Intravenous Q8H  . ipratropium-albuterol  3 mL Nebulization Q4H  . isosorbide mononitrate  60 mg Oral Daily  . latanoprost  1 drop Both Eyes QHS  . levothyroxine  75 mcg Oral QAC breakfast  . multivitamin with minerals  1 tablet Oral Daily  . NIFEdipine  30 mg Oral BID  . omega-3 acid ethyl esters   Oral BID  . sodium chloride flush  3 mL Intravenous Q12H  . timolol  1 drop Both Eyes BID   Continuous Infusions: . ceFEPime (MAXIPIME) IV Stopped (05/03/17 1056)  . heparin 1,050 Units/hr (05/03/17 0500)   PRN Meds:.acetaminophen, ALPRAZolam, ipratropium-albuterol, morphine injection, nitroGLYCERIN, nitroGLYCERIN, ondansetron (ZOFRAN) IV, phenol, traMADol Medications Prior to Admission:  Prior to Admission medications   Medication Sig Start Date End Date Taking? Authorizing Provider  Alpha-Lipoic Acid 200 MG CAPS Take 1 capsule by mouth daily.   Yes [provider]  atorvastatin (LIPITOR) 80 MG tablet Take 1 tablet (80 mg total) by mouth daily. 02/16/17  Yes Minna Merritts, MD  CALCIUM CITRATE PO Take 1 tablet by mouth daily. Takes  300mg  daily.   Yes [provider]  carvedilol (COREG) 6.25 MG tablet Take 1 tablet (6.25 mg total) by mouth 2 (two) times daily. 03/20/17  Yes Minna Merritts, MD  Cholecalciferol (VITAMIN D) 2000 units CAPS Take 4,000 Units by mouth daily.  Yes [provider]  Coenzyme Q10 (CO Q10) 200 MG CAPS Take 1 capsule by mouth daily.   Yes [provider]  CRANBERRY EXTRACT PO Take 1 tablet by mouth daily. Takes 1500mg  daily   Yes [provider]  furosemide (LASIX) 40 MG tablet Take 40 mg by mouth daily.   Yes [provider]  isosorbide mononitrate (IMDUR) 30 MG 24 hr tablet Take 2 tablets (60 mg total) by mouth daily. 02/15/17 08/14/17 Yes Cheryln Manly, NP  latanoprost (XALATAN) 0.005 % ophthalmic solution Place 1 drop into both eyes at bedtime. In morning 10/27/15  Yes [provider]  levothyroxine (SYNTHROID, LEVOTHROID) 75 MCG tablet Take 1 tablet (75 mcg total) by mouth daily. 07/21/16  Yes [name], MD  Multiple Vitamin (MULTIVITAMIN) tablet Take 1 tablet by mouth daily.   Yes [provider]  NIFEdipine (PROCARDIA-XL/ADALAT CC) 60 MG 24 hr tablet Take 1 tablet (60 mg total) by mouth 2 (two) times daily. 03/20/17  Yes Gollan, Kathlene November, MD  nitroGLYCERIN (NITROSTAT) 0.4 MG SL tablet Place 1 tablet (0.4 mg total) under the tongue every 5 (five) minutes as needed for chest pain. 07/25/16 04/30/17 Yes Gollan, Kathlene November, MD  Omega 3-6-9 Fatty Acids (OMEGA-3-6-9 PO) Take 1 capsule by mouth 2 (two) times daily.   Yes [provider]  Rivaroxaban (XARELTO) 15 MG TABS tablet Take 1 tablet (15 mg total) by mouth daily with supper. 10/27/16  Yes Gollan, Kathlene November, MD  timolol (TIMOPTIC) 0.25 % ophthalmic solution Place 1 drop into both eyes 2 (two) times daily. am 08/14/15  Yes [provider]  traMADol (ULTRAM) 50 MG tablet Take 2 tablets (100 mg total) by mouth 3 (three) times daily as needed. 03/01/17  Yes [name], MD  valsartan (DIOVAN) 80 MG tablet Take 1 tablet (80 mg total) by mouth 2 (two) times daily. 02/16/17  Yes Minna Merritts, MD  ezetimibe (ZETIA) 10 MG tablet Take 1 tablet (10 mg total) by mouth daily. 02/16/17   Minna Merritts, MD   No Known Allergies Review of Systems  Physical Exam  Vital Signs: BP 107/69   Pulse 91   Temp (!) 100.5 F (38.1 C) (Axillary)   Resp (!) 26   Ht 5\' 3"  (1.6 m)   Wt 62.6 kg (138 lb 0.1 oz)   SpO2 93%   BMI 24.45 kg/m  Pain Assessment: 0-10   Pain Score: 0-No pain   SpO2: SpO2: 93 % O2 Device:SpO2: 93 % O2 Flow Rate: .O2 Flow Rate (L/min): 50 L/min  IO: Intake/output summary:   Intake/Output Summary (Last 24 hours) at 05/03/2017 1621 Last data filed at 05/03/2017 1400 Gross per 24 hour  Intake 630.98 ml  Output 800 ml  Net -169.02 ml    LBM: Last BM Date: 04/30/17 Baseline Weight: Weight: 66.7 kg (147 lb) Most recent weight: Weight: 62.6 kg (138 lb 0.1 oz)     Palliative Assessment/Data: 70%     Time In: 3:30 Time Out: 4:20 Time Total:50 min Greater than 50%  of this time was spent counseling and coordinating care related to the above assessment and plan.  Signed by: Asencion Gowda, NP   Please contact Palliative Medicine Team phone at [phone] for questions and concerns.  For individual provider: See Shea Evans

## 2017-05-03 NOTE — Progress Notes (Signed)
Notified Dr. Duane Boston of worsening resp status.  EKG obtained and patient placed on bipap.  Moved to CCU with belongings.

## 2017-05-03 NOTE — Progress Notes (Signed)
[facility]  ROUNDING NOTE   Subjective:   Wife at bedside.   Moved to ICU. Placed on Bipap last night.   Patient complains of anuria and being thirsty.   Objective:  Vital signs in last 24 hours:  Temp:  [98 F (36.7 C)-98.5 F (36.9 C)] 98.5 F (36.9 C) (02/28 0021) Pulse Rate:  [83-131] 110 (02/28 0500) Resp:  [18-29] 27 (02/28 0500) BP: (102-122)/(50-110) 107/65 (02/28 0500) SpO2:  [90 %-99 %] 96 % (02/28 0500) FiO2 (%):  [55 %] 55 % (02/27 1950) Weight:  [62.6 kg (138 lb 0.1 oz)] 62.6 kg (138 lb 0.1 oz) (02/28 0500)  Weight change: -2.038 kg (-7.9 oz) Filed Weights   05/01/17 0255 05/02/17 0525 05/03/17 0500  Weight: 64.9 kg (143 lb) 64.6 kg (142 lb 8 oz) 62.6 kg (138 lb 0.1 oz)    Intake/Output: I/O last 3 completed shifts: In: 436.5 [I.V.:436.5] Out: 1450 [Urine:1450]   Intake/Output this shift:  No intake/output data recorded.  Physical Exam: General: NAD,   Head: Normocephalic, atraumatic. Moist oral mucosal membranes  Eyes: Anicteric, PERRL  Neck: Supple, trachea midline  Lungs:  Clear to auscultation, HFNC+  Heart: Regular rate and rhythm, +murmur  Abdomen:  Soft, nontender,   Extremities: no peripheral edema.  Neurologic: Nonfocal, moving all four extremities  Skin: No lesions        Basic Metabolic Panel: Recent Labs  Lab 04/30/17 1447 05/01/17 0413 05/02/17 0638 05/03/17 0402  NA 134* 135 136 136  K 5.0 4.7 4.3 5.0  CL 102 103 106 104  CO2 17* 20* 18* 17*  GLUCOSE 163* 129* 200* 196*  BUN 94* 106* 105* 94*  CREATININE 3.73* 3.70* 2.49* 2.05*  CALCIUM 8.5* 8.2* 8.6* 8.6*    Liver Function Tests: No results for input(s): AST, ALT, ALKPHOS, BILITOT, PROT, ALBUMIN in the last 168 hours. No results for input(s): LIPASE, AMYLASE in the last 168 hours. No results for input(s): AMMONIA in the last 168 hours.  CBC: Recent Labs  Lab 04/30/17 1447 05/01/17 0413 05/02/17 0638 05/03/17 0533  WBC 9.9 7.4 6.1 14.2*  HGB  11.0* 10.4* 10.7* 11.4*  HCT 33.2* 30.8* 31.0* 34.8*  MCV 91.9 91.6 90.0 91.1  PLT 264 234 278 359    Cardiac Enzymes: Recent Labs  Lab 04/30/17 1447 04/30/17 2108 05/01/17 0219 05/01/17 0810  TROPONINI 9.24* 12.08* 10.60* 6.92*    BNP: Invalid input(s): POCBNP  CBG: Recent Labs  Lab 05/03/17 0133  GLUCAP 280*    Microbiology: Results for orders placed or performed during the hospital encounter of 04/30/17  MRSA PCR Screening     Status: None   Collection Time: 05/03/17  1:32 AM  Result Value Ref Range Status   MRSA by PCR NEGATIVE NEGATIVE Final    Comment:        The GeneXpert MRSA Assay (FDA approved for NASAL specimens only), is one component of a comprehensive MRSA colonization surveillance program. It is not intended to diagnose MRSA infection nor to guide or monitor treatment for MRSA infections. Performed at [facility], [address] [place], [place] [address]     Coagulation Studies: Recent Labs    04/30/17 1741  LABPROT 18.0*  INR 1.50    Urinalysis: No results for input(s): COLORURINE, LABSPEC, PHURINE, GLUCOSEU, HGBUR, BILIRUBINUR, KETONESUR, PROTEINUR, UROBILINOGEN, NITRITE, LEUKOCYTESUR in the last 72 hours.  Invalid input(s): APPERANCEUR    Imaging: US Renal  Result Date: 05/01/2017 CLINICAL DATA:  Acute renal failure EXAM:  RENAL / URINARY TRACT ULTRASOUND COMPLETE COMPARISON:  None. FINDINGS: Right Kidney: Length: 11.7 cm. Echogenicity and renal cortical thickness are within normal limits. No mass, perinephric fluid, or hydronephrosis visualized. No sonographically demonstrable calculus or ureterectasis. Left Kidney: Length: 7.7 cm. Echogenicity is increased. There is renal cortical thinning on the left. No perinephric fluid or hydronephrosis visualized. There is a cyst arising from the upper pole left kidney measuring 0.7 x 0.7 x 0.9 cm. There is a calculus in the lower pole left kidney measuring 6 mm. No ureterectasis.  Bladder: Appears normal for degree of bladder distention. Incidental note is made of a left pleural effusion. IMPRESSION: 1. Left kidney is small with increased echogenicity and decreased cortical thickness consistent with atrophy. Small cyst upper pole left kidney. Nonobstructing 6 mm calculus lower pole left kidney. Etiology for the atrophy uncertain. It is possible that this atrophy is due to chronic scarring. It is also possible that there is renal artery stenosis on the left. In this regard, question whether patient is hypertensive. 2.  Right kidney appears normal. 3.  Incidental note is made of left pleural effusion. Electronically Signed   By: Lowella Grip III M.D.   On: 05/01/2017 09:19   Dg Chest Port 1 View  Result Date: 05/01/2017 CLINICAL DATA:  Short of breath EXAM: PORTABLE CHEST 1 VIEW COMPARISON:  04/30/2017, 07/22/2016 FINDINGS: Post sternotomy changes. Cardiomegaly with vascular congestion. Development of diffuse interstitial and airspace disease suspicious for pulmonary edema with more confluent airspace disease at the bases. Tiny pleural effusions. Aortic atherosclerosis. No pneumothorax. Surgical clips at the right neck IMPRESSION: 1. Cardiomegaly with vascular congestion and worsening interstitial and alveolar opacity most consistent with pulmonary edema. More confluent opacity at the lung bases makes it difficult to exclude superimposed pneumonia 2. Tiny pleural effusions. Electronically Signed   By: Donavan Foil M.D.   On: 05/01/2017 17:10     Medications:   . heparin 1,050 Units/hr (05/03/17 0500)   . aspirin EC  81 mg Oral Daily  . atorvastatin  80 mg Oral Daily  . budesonide (PULMICORT) nebulizer solution  0.5 mg Nebulization BID  . calcium citrate  200 mg of elemental calcium Oral Daily  . carvedilol  3.125 mg Oral BID WC  . cholecalciferol  4,000 Units Oral Daily  . ezetimibe  10 mg Oral Daily  . furosemide  40 mg Intravenous Q8H  . isosorbide mononitrate  60 mg  Oral Daily  . latanoprost  1 drop Both Eyes QHS  . levothyroxine  75 mcg Oral QAC breakfast  . morphine      . multivitamin with minerals  1 tablet Oral Daily  . NIFEdipine  30 mg Oral BID  . omega-3 acid ethyl esters   Oral BID  . sodium chloride flush  3 mL Intravenous Q12H  . timolol  1 drop Both Eyes BID   acetaminophen, ALPRAZolam, ipratropium-albuterol, morphine injection, nitroGLYCERIN, nitroGLYCERIN, ondansetron (ZOFRAN) IV, phenol, traMADol  Assessment/ Plan:  [name] is a 84 y.o. white male with hypertension, coronary artery disease, history of DVT, glaucoma, history of prostate cancer, hyperlipidemia, hypothyroidism, aortic stenosis, diastolic congestive heart failure who is admitted for acute coronary syndrome.   1. Acute renal failure on chronic kidney disease stage III: Baseline creatinine 1.48, GFR of 12/2016.  Acute renal failure from acute coronary syndrome and acute cardiorenal syndrome.  Renal ultrasound consistent with left sided renal artery stenosis.  Nonoliguric urine output. Creatinine improving.  Monitor urine output.  - Recommend  renally dosing all medications. Appreciate cardiology input.  - holding valsartan  2. Hypertension: with acute diastolic congestive heart failure/aortic stenosis and coronary syndrome.  Acute respiratory failure with hypoxia. Appreciate critical care input Blood pressure is low. Echocardiogram pending - Continue furosemide 40mg  IV q8 - Continue cavedilol, imdur, nifedipine - Appreciate cardiology input.   3. Anemia of chronic kidney disease: hemoglobin 11.4. No indication for ESA    LOS: 3 Dennis Burns 2/28/20198:19 AM

## 2017-05-03 NOTE — Progress Notes (Signed)
   05/03/17 1050  Clinical Encounter Type  Visited With Patient and family together  Visit Type Initial   Introductory visit with patient and spouse.  Expressed appreciation for visit but indicated that their priest had been to visit in the last few days and they had no need for chaplain support at this time.

## 2017-05-03 NOTE — Progress Notes (Signed)
Pharmacy Antibiotic Note  Dennis Burns is a 84 y.o. male admitted on 04/30/2017 with pneumonia and bacteremia.  Pharmacy has been consulted for cefepime dosing. Per ICU rounds pneumonia could not be ruled out on x-ray.   Plan: Cefepime 1g IV Q24hr.   Height: 5\' 3"  (160 cm) Weight: 138 lb 0.1 oz (62.6 kg) IBW/kg (Calculated) : 56.9  Temp (24hrs), Avg:98.9 F (37.2 C), Min:98 F (36.7 C), Max:100.5 F (38.1 C)  Recent Labs  Lab 04/30/17 1447 05/01/17 0413 05/02/17 0638 05/03/17 0402 05/03/17 0533  WBC 9.9 7.4 6.1  --  14.2*  CREATININE 3.73* 3.70* 2.49* 2.05*  --     Estimated Creatinine Clearance: 20.8 mL/min (A) (by C-G formula based on SCr of 2.05 mg/dL (H)).    No Known Allergies  Antimicrobials this admission: Cefepime 2/28 >>   Dose adjustments this admission: N/A  Microbiology results: 2/28 MRSA PCR: negative   Thank you for allowing pharmacy to be a part of this patient's care.  Simpson,Michael L 05/03/2017 3:23 PM

## 2017-05-03 NOTE — Addendum Note (Signed)
Addended by: Anselm Pancoast on: 05/03/2017 10:19 AM   Modules accepted: Orders

## 2017-05-03 NOTE — Progress Notes (Signed)
Patient only voiding small amounts during the morning, patient bladder scanned and 520 mL showed. Per Dr. Mortimer Fries okay to place foley if patient unable to void. Patient able to void 200 mL, and requested to wear condom cath. Condom cath placed. Patient still only voided 200 mL in 5 hours. Bladder scanned again and showed 500 mL. Order placed for foley, unable to place foley d/t prostate, coude placed instead. 500 mL drained. Dennis Burns

## 2017-05-03 NOTE — Progress Notes (Signed)
CHIEF COMPLAINT:   Chief Complaint  Patient presents with  . Shortness of Breath    Subjective  Transferred to ICU/SD for acute resp failure Placed on BiPAP  Patient alert and awake Plan to wean off as tolerated Oxygen as needed Try high flow [place]      Objective   Examination:  General exam: Appears calm and comfortable  Respiratory system: Clear to auscultation. Respiratory effort normal. HEENT: [place]/AT, PERRLA, no thrush, no stridor. Cardiovascular system: S1 & S2 heard, RRR. No JVD, murmurs, rubs, gallops or clicks. No pedal edema. Gastrointestinal system: Abdomen is nondistended, soft and nontender. No organomegaly or masses felt. Normal bowel sounds heard. Central nervous system: Alert and oriented. No focal neurological deficits. Extremities: Symmetric 5 x 5 power. Skin: No rashes, lesions or ulcers Psychiatry: Judgement and insight appear normal. Mood & affect appropriate.   VITALS:  height is 5\' 3"  (1.6 m) and weight is 138 lb 0.1 oz (62.6 kg). His oral temperature is 98.5 F (36.9 C). His blood pressure is 107/65 and his pulse is 110 (abnormal). His respiration is 27 (abnormal) and oxygen saturation is 96%.   I personally reviewed Labs under Results section.  Radiology Reports US Renal  Result Date: 05/01/2017 CLINICAL DATA:  Acute renal failure EXAM: RENAL / URINARY TRACT ULTRASOUND COMPLETE COMPARISON:  None. FINDINGS: Right Kidney: Length: 11.7 cm. Echogenicity and renal cortical thickness are within normal limits. No mass, perinephric fluid, or hydronephrosis visualized. No sonographically demonstrable calculus or ureterectasis. Left Kidney: Length: 7.7 cm. Echogenicity is increased. There is renal cortical thinning on the left. No perinephric fluid or hydronephrosis visualized. There is a cyst arising from the upper pole left kidney measuring 0.7 x 0.7 x 0.9 cm. There is a calculus in the lower pole left kidney measuring 6 mm. No ureterectasis. Bladder: Appears  normal for degree of bladder distention. Incidental note is made of a left pleural effusion. IMPRESSION: 1. Left kidney is small with increased echogenicity and decreased cortical thickness consistent with atrophy. Small cyst upper pole left kidney. Nonobstructing 6 mm calculus lower pole left kidney. Etiology for the atrophy uncertain. It is possible that this atrophy is due to chronic scarring. It is also possible that there is renal artery stenosis on the left. In this regard, question whether patient is hypertensive. 2.  Right kidney appears normal. 3.  Incidental note is made of left pleural effusion. Electronically Signed   By: Lowella Grip III M.D.   On: 05/01/2017 09:19   Dg Chest Port 1 View  Result Date: 05/01/2017 CLINICAL DATA:  Short of breath EXAM: PORTABLE CHEST 1 VIEW COMPARISON:  04/30/2017, 07/22/2016 FINDINGS: Post sternotomy changes. Cardiomegaly with vascular congestion. Development of diffuse interstitial and airspace disease suspicious for pulmonary edema with more confluent airspace disease at the bases. Tiny pleural effusions. Aortic atherosclerosis. No pneumothorax. Surgical clips at the right neck IMPRESSION: 1. Cardiomegaly with vascular congestion and worsening interstitial and alveolar opacity most consistent with pulmonary edema. More confluent opacity at the lung bases makes it difficult to exclude superimposed pneumonia 2. Tiny pleural effusions. Electronically Signed   By: Donavan Foil M.D.   On: 05/01/2017 17:10  I have Independently reviewed images of  CXR   on 2.26.19 Interpretation:b/l lower lobe opacities      Assessment/Plan:  84 year old male with history of CAD status post CABG, PAD, hypertension , chronic kidney disease stage III and DVT on anticoagulation who presented due to shortness of breath with acute sustolic  CHF EF 30%.  1. Non-ST elevation MI: Troponin max is 12.08 Due to acute on chronic kidney disease he is currently not a candidate for  cardiac catheterization Continue aspirin, statin, Coreg Continue heparin drip Cardiology consultation follow up Echocardiogram shows ejection fraction 35-40% with probable hypokinesis of the apical anterior,lateral, and apical myocardium. Probable hypokinesis of the basal-midinferolateral myocardium   2. Acute hypoxic respiratory failure due to Acute on chronic combined systolic and diastolic heart failure with ejection fraction 35-40%  heart failure:  Continue aggressive IV Lasix Monitor intake and output daily weight Wean high flow O2 nasal cannula as tolerated Echo shows elevated right heart pressure.  3. Hyperlipidemia: Continues Zetia and Lipitor  4. Hypothyroidism: Continue Synthroid  5. Essential hypertension: Continue Coreg and nifedipine 6. Acute on chronic kidney disease stage III: Nephrology consultation appreciated acute kidney injury in the setting of cardiorenal syndrome  Creatinine improving with diuresis   Renal ultrasound reviewed, no obstruction seen.   7. History of DVT: Currently on heparin xarelto on hold due to this Dopplers were negative for acute DVT            Code Status:FULL  Remains SD status    Khayri Kargbo Patricia Pesa, M.D.  [facility]  Medical Director [place] Director [facility] Cardio-Pulmonary Department

## 2017-05-03 NOTE — Progress Notes (Signed)
ANTICOAGULATION CONSULT NOTE   Pharmacy Consult for heparin drip Indication: chest pain/ACS  No Known Allergies  Patient Measurements: Height: 5\' 3"  (160 cm) Weight: 138 lb 0.1 oz (62.6 kg) IBW/kg (Calculated) : 56.9 Heparin Dosing Weight: 67 kg  Vital Signs: Temp: 100.5 F (38.1 C) (02/28 1152) Temp Source: Axillary (02/28 1152) BP: 107/69 (02/28 1400) Pulse Rate: 91 (02/28 1400)  Labs: Recent Labs    04/30/17 1741 04/30/17 2108 05/01/17 0219  05/01/17 0413 05/01/17 0810 05/01/17 1004 05/01/17 2248 05/02/17 0638 05/03/17 0402 05/03/17 0533  HGB  --   --   --    < > 10.4*  --   --   --  10.7*  --  11.4*  HCT  --   --   --   --  30.8*  --   --   --  31.0*  --  34.8*  PLT  --   --   --   --  234  --   --   --  278  --  359  APTT 48*  --  50*  --   --   --  60* 66* 78*  --   --   LABPROT 18.0*  --   --   --   --   --   --   --   --   --   --   INR 1.50  --   --   --   --   --   --   --   --   --   --   HEPARINUNFRC 2.04*  --  1.44*  --   --   --  1.40*  --  0.53  --  0.44  CREATININE  --   --   --   --  3.70*  --   --   --  2.49* 2.05*  --   TROPONINI  --  12.08* 10.60*  --   --  6.92*  --   --   --   --   --    < > = values in this interval not displayed.    Estimated Creatinine Clearance: 20.8 mL/min (A) (by C-G formula based on SCr of 2.05 mg/dL (H)).   Medical History: Past Medical History:  Diagnosis Date  . Allergic rhinitis due to pollen    as child--better now  . Chronic kidney disease, stage III (moderate) (HCC)   . Coronary artery disease   . DVT, lower extremity, recurrent ([place])   . Glaucoma    [facility]   . History of prostate cancer 2004  . Hyperlipidemia   . Hypertension   . Hypothyroidism   . Impaired fasting glucose   . Spinal stenosis of lumbar region with radiculopathy     Assessment: Pharmacy consulted to dose and monitor heparin drip in this 84 year old man for ACS. Patient on rivaroxaban as an outpatient and initially monitored  via APTTs. Patient now being managed with anti-Xa levels and currently receiving heparin at 1050 units/hr.    Goal of Therapy:  APTT 66-102 seconds Heparin level 0.3-0.7 units/ml Monitor platelets by anticoagulation protocol: Yes   Plan:  Continue heparin at 1050 units/hr and proceed with next scheduled anti-Xa level with am labs.   Pharmacy will continue to monitor and adjust per consult.   Ileene Allie L 05/03/2017,3:17 PM

## 2017-05-04 ENCOUNTER — Inpatient Hospital Stay: Payer: Medicare PPO

## 2017-05-04 DIAGNOSIS — J810 Acute pulmonary edema: Secondary | ICD-10-CM

## 2017-05-04 LAB — BASIC METABOLIC PANEL
Anion gap: 15 (ref 5–15)
BUN: 100 mg/dL — AB (ref 6–20)
CHLORIDE: 103 mmol/L (ref 101–111)
CO2: 20 mmol/L — AB (ref 22–32)
Calcium: 8.6 mg/dL — ABNORMAL LOW (ref 8.9–10.3)
Creatinine, Ser: 1.91 mg/dL — ABNORMAL HIGH (ref 0.61–1.24)
GFR calc non Af Amer: 30 mL/min — ABNORMAL LOW (ref >60)
GFR, EST AFRICAN AMERICAN: 35 mL/min — AB (ref >60)
Glucose, Bld: 140 mg/dL — ABNORMAL HIGH (ref 65–99)
POTASSIUM: 4 mmol/L (ref 3.5–5.1)
Sodium: 138 mmol/L (ref 135–145)

## 2017-05-04 LAB — CBC
HEMATOCRIT: 33.5 % — AB (ref 40.0–52.0)
HEMOGLOBIN: 11.2 g/dL — AB (ref 13.0–18.0)
MCH: 30.5 pg (ref 26.0–34.0)
MCHC: 33.5 g/dL (ref 32.0–36.0)
MCV: 91 fL (ref 80.0–100.0)
Platelets: 333 10*3/uL (ref 150–440)
RBC: 3.68 MIL/uL — ABNORMAL LOW (ref 4.40–5.90)
RDW: 15.1 % — ABNORMAL HIGH (ref 11.5–14.5)
WBC: 10.8 10*3/uL — ABNORMAL HIGH (ref 3.8–10.6)

## 2017-05-04 LAB — PROCALCITONIN: Procalcitonin: 1.72 ng/mL

## 2017-05-04 LAB — HEPARIN LEVEL (UNFRACTIONATED)
HEPARIN UNFRACTIONATED: 0.36 [IU]/mL (ref 0.30–0.70)
Heparin Unfractionated: 0.22 IU/mL — ABNORMAL LOW (ref 0.30–0.70)
Heparin Unfractionated: 0.29 IU/mL — ABNORMAL LOW (ref 0.30–0.70)

## 2017-05-04 MED ORDER — FUROSEMIDE 10 MG/ML IJ SOLN
60.0000 mg | Freq: Three times a day (TID) | INTRAMUSCULAR | Status: DC
Start: 2017-05-04 — End: 2017-05-07
  Administered 2017-05-04 – 2017-05-07 (×9): 60 mg via INTRAVENOUS
  Filled 2017-05-04 (×9): qty 6

## 2017-05-04 MED ORDER — HEPARIN BOLUS VIA INFUSION
1000.0000 [IU] | Freq: Once | INTRAVENOUS | Status: AC
  Administered 2017-05-04: 1000 [IU] via INTRAVENOUS
  Filled 2017-05-04: qty 1000

## 2017-05-04 MED ORDER — MILRINONE LACTATE IN DEXTROSE 20-5 MG/100ML-% IV SOLN
0.2500 ug/kg/min | INTRAVENOUS | Status: DC
  Administered 2017-05-04 – 2017-05-06 (×3): 0.25 ug/kg/min via INTRAVENOUS
  Filled 2017-05-04 (×4): qty 100

## 2017-05-04 NOTE — Progress Notes (Signed)
[place] [facility] [place] NAME: Dennis Burns    MR#:  [id number]  DATE OF BIRTH:  05/06/1928  SUBJECTIVE:   Patient with difficulty voiding yesterday. Condom catheter placed. Patient is at high flow nasal cannula. He was on BiPAP overnight.  REVIEW OF SYSTEMS:    Review of Systems  Constitutional: Negative for fever, chills weight loss HENT: Negative for ear pain, nosebleeds, congestion, facial swelling, rhinorrhea, neck pain, neck stiffness and ear discharge.   Respiratory: Negative for cough, ++shortness of breath, NO wheezing  Cardiovascular: Negative for chest pain, palpitations and leg swelling.  Gastrointestinal: Negative for heartburn, abdominal pain, vomiting, diarrhea or consitpation Genitourinary: Negative for dysuria, urgency, frequency, hematuria Musculoskeletal: Negative for back pain or joint pain Neurological: Negative for dizziness, seizures, syncope, focal weakness,  numbness and headaches.  Hematological: Does not bruise/bleed easily.  Psychiatric/Behavioral: Negative for hallucinations, confusion, dysphoric mood    Tolerating Diet: yes      DRUG ALLERGIES:  No Known Allergies  VITALS:  Blood pressure 113/60, pulse 96, temperature 97.8 F (36.6 C), temperature source Oral, resp. rate (!) 23, height 5\' 3"  (1.6 m), weight 65.1 kg (143 lb 8.3 oz), SpO2 96 %.  PHYSICAL EXAMINATION:  Constitutional: Appears well-developed and well-nourished. No distress. HENT: Normocephalic. Patient on BiPAP Eyes: Conjunctivae and EOM are normal. PERRLA, no scleral icterus.  Neck: Normal ROM. Neck supple. ++JVD. No tracheal deviation. CVS: Tachycardia S1/S2 +, 2/6 SEM,  no gallops, no carotid bruit.  Pulmonary: Effort and breath sounds normal, no stridor, rhonchi, wheezes, rales.  Abdominal: Soft. BS +,  no distension, tenderness, rebound or guarding.  Musculoskeletal: Normal range of motion. No edema and no tenderness.  Neuro: Alert.  CN 2-12 grossly intact. No focal deficits. Skin: Skin is warm and dry. No rash noted. Psychiatric: Normal mood and affect.      LABORATORY PANEL:   CBC Recent Labs  Lab 05/04/17 0502  WBC 10.8*  HGB 11.2*  HCT 33.5*  PLT 333   ------------------------------------------------------------------------------------------------------------------  Chemistries  Recent Labs  Lab 05/04/17 0502  NA 138  K 4.0  CL 103  CO2 20*  GLUCOSE 140*  BUN 100*  CREATININE 1.91*  CALCIUM 8.6*   ------------------------------------------------------------------------------------------------------------------  Cardiac Enzymes Recent Labs  Lab 04/30/17 2108 05/01/17 0219 05/01/17 0810  TROPONINI 12.08* 10.60* 6.92*   ------------------------------------------------------------------------------------------------------------------  RADIOLOGY:  Dg Chest Port 1 View  Result Date: 05/04/2017 CLINICAL DATA:  Respiratory failure EXAM: PORTABLE CHEST 1 VIEW COMPARISON:  05/01/2017 FINDINGS: Cardiomegaly, symmetric interstitial and airspace opacity and small pleural effusions. Prior median sternotomy. History of carotid endarterectomy with clips on the right. IMPRESSION: CHF. Electronically Signed   By: Monte Fantasia M.D.   On: 05/04/2017 06:44     ASSESSMENT AND PLAN:  84 year old male with history of CAD status post CABG, PAD, hypertension , chronic kidney disease stage III and DVT on anticoagulation who presented due to shortness of breath.  1. Non-ST elevation MI: Troponin max is 12.08 Due to acute on chronic kidney disease he is currently not a candidate for cardiac catheterization Continue aspirin, statin, Coreg Continue heparin drip Cardiology consultation appreciated Echocardiogram shows ejection fraction 35-40% with probable hypokinesis of the apical anterior,lateral, and apical myocardium. Probable hypokinesis of the basal-midinferolateral myocardium Possible cardiac  catheterization at a later date as recommended by cardiology  2. Acute hypoxic respiratory failure due to Acute on chronic combined systolic and diastolic heart failure with ejection fraction 35-40%  heart failure  and pulmonary hypertension:  Continue to wean to nasal cannula as tolerated  Continue IV Lasix  Monitor intake and output daily weight Echo shows elevated right heart pressure. Appreciate intensivist consult Milrinone added for inoptropic support  3. Hyperlipidemia: Continues Zetia and Lipitor  4. Hypothyroidism: Continue Synthroid  5. Essential hypertension: Continue Coreg and nifedipine and isosorbide 6. Acute on chronic kidney disease stage III: Nephrology consultation appreciated acute kidney injury in the setting of cardiorenal syndrome  Creatinine improving with diuresis   Renal ultrasound reviewed, no obstruction seen.   7. History of DVT: Currently on heparin xarelto on hold due to this Dopplers were negative for acute DVT    Management plans discussed with the patient and wife and they are in agreement.  CODE STATUS: full  TOTAL TIME TAKING CARE OF THIS PATIENT: 25 minutes.    POSSIBLE D/C 3-4 days, DEPENDING ON CLINICAL CONDITION.   [name] M.D on 05/04/2017 at 8:33 AM  Between 7am to 6pm - Pager - [phone] After 6pm go to [email or web address] - password [email or web address] [facility]  Office  [phone]  CC: Primary care physician; [name], MD  Note: This dictation was prepared with Dragon dictation along with smaller phrase technology. Any transcriptional errors that result from this process are unintentional.

## 2017-05-04 NOTE — Progress Notes (Signed)
Progress Note  Patient Name: Dennis Burns Date of Encounter: 05/04/2017  Primary Cardiologist: Ida Rogue, MD   Subjective   Persistent hypoxia that required BiPAP and high flow nasal cannula.  Chest x-ray was done today and shows worsening heart failure.   his weight is still the same since admission.    Inpatient Medications    Scheduled Meds: . aspirin EC  81 mg Oral Daily  . atorvastatin  80 mg Oral Daily  . budesonide (PULMICORT) nebulizer solution  0.5 mg Nebulization BID  . calcium citrate  200 mg of elemental calcium Oral Daily  . carvedilol  6.25 mg Oral BID WC  . cholecalciferol  4,000 Units Oral Daily  . ezetimibe  10 mg Oral Daily  . furosemide  60 mg Intravenous Q8H  . heparin  1,000 Units Intravenous Once  . ipratropium-albuterol  3 mL Nebulization Q4H  . isosorbide mononitrate  60 mg Oral Daily  . latanoprost  1 drop Both Eyes QHS  . levothyroxine  75 mcg Oral QAC breakfast  . multivitamin with minerals  1 tablet Oral Daily  . omega-3 acid ethyl esters   Oral BID  . sodium chloride flush  3 mL Intravenous Q12H  . timolol  1 drop Both Eyes BID   Continuous Infusions: . ceFEPime (MAXIPIME) IV Stopped (05/03/17 1056)  . heparin 1,050 Units/hr (05/03/17 2222)   PRN Meds: acetaminophen, ALPRAZolam, ipratropium-albuterol, morphine injection, nitroGLYCERIN, nitroGLYCERIN, ondansetron (ZOFRAN) IV, phenol, traMADol   Vital Signs    Vitals:   05/04/17 0400 05/04/17 0415 05/04/17 0500 05/04/17 0600  BP: (!) 106/58  112/63 113/60  Pulse: 84  99 96  Resp: (!) 22  (!) 24 (!) 23  Temp:      TempSrc:      SpO2: 96% 96% 95% 96%  Weight:   143 lb 8.3 oz (65.1 kg)   Height:        Intake/Output Summary (Last 24 hours) at 05/04/2017 0822 Last data filed at 05/04/2017 0600 Gross per 24 hour  Intake 362.51 ml  Output 2850 ml  Net -2487.49 ml   Filed Weights   05/02/17 0525 05/03/17 0500 05/04/17 0500  Weight: 142 lb 8 oz (64.6 kg) 138 lb 0.1 oz (62.6 kg) 143  lb 8.3 oz (65.1 kg)    Telemetry    Sinus rhythm with sinus tachycardia- Personally Reviewed  ECG      Physical Exam   GEN:  Mild distress Neck:  Significant JVD Cardiac: RRR, no murmurs, rubs, or gallops.  Respiratory:  Diminished breath sound at the base with bibasilar crackles. GI: Soft, nontender, non-distended  MS: No edema; No deformity. Neuro:  Nonfocal  Psych: Normal affect   Labs    Chemistry Recent Labs  Lab 05/02/17 0638 05/03/17 0402 05/04/17 0502  NA 136 136 138  K 4.3 5.0 4.0  CL 106 104 103  CO2 18* 17* 20*  GLUCOSE 200* 196* 140*  BUN 105* 94* 100*  CREATININE 2.49* 2.05* 1.91*  CALCIUM 8.6* 8.6* 8.6*  GFRNONAA 22* 28* 30*  GFRAA 25* 32* 35*  ANIONGAP 12 15 15      Hematology Recent Labs  Lab 05/02/17 1093 05/03/17 0533 05/04/17 0502  WBC 6.1 14.2* 10.8*  RBC 3.44* 3.81* 3.68*  HGB 10.7* 11.4* 11.2*  HCT 31.0* 34.8* 33.5*  MCV 90.0 91.1 91.0  MCH 31.1 30.0 30.5  MCHC 34.6 32.9 33.5  RDW 15.0* 15.3* 15.1*  PLT 278 359 333    Cardiac Enzymes Recent  Labs  Lab 04/30/17 1447 04/30/17 2108 05/01/17 0219 05/01/17 0810  TROPONINI 9.24* 12.08* 10.60* 6.92*   No results for input(s): TROPIPOC in the last 168 hours.   BNP Recent Labs  Lab 04/30/17 1741 04/30/17 2108  BNP 4,309.0* 4,115.0*     DDimer No results for input(s): DDIMER in the last 168 hours.   Radiology    Dg Chest Port 1 View  Result Date: 05/04/2017 CLINICAL DATA:  Respiratory failure EXAM: PORTABLE CHEST 1 VIEW COMPARISON:  05/01/2017 FINDINGS: Cardiomegaly, symmetric interstitial and airspace opacity and small pleural effusions. Prior median sternotomy. History of carotid endarterectomy with clips on the right. IMPRESSION: CHF. Electronically Signed   By: Monte Fantasia M.D.   On: 05/04/2017 06:44    Cardiac Studies   Echo: - Left ventricle: The cavity size was normal. Wall thickness was   increased in a pattern of mild LVH. Systolic function was    moderately reduced. The estimated ejection fraction was in the   range of 35% to 40%. Probable hypokinesis of the apicalanterior,   lateral, and apical myocardium. Probable hypokinesis of the   basal-midinferolateral myocardium. Doppler parameters are   consistent with restrictive physiology, indicative of decreased   left ventricular diastolic compliance and/or increased left   atrial pressure. Doppler parameters are consistent with high   ventricular filling pressure. - Aortic valve: Valve mobility was restricted. Transvalvular   velocity was increased. There was likely moderate stenosis;   degree of stenosis/transvavular gradient may be underestimated by   low LVEF. There was moderate regurgitation. - Mitral valve: Calcified annulus. Mildly thickened leaflets .   There was moderate to severe regurgitation. - Left atrium: The atrium was mildly dilated. - Right ventricle: Systolic function was mildly reduced. - Tricuspid valve: There was moderate regurgitation. - Pulmonary arteries: Systolic pressure was severely increased, in   the range of 70 mm Hg to 75 mm Hg. - Pericardium, extracardiac: There was a left pleural effusion.  Patient Profile     84 y.o. male w/ a h/o CAD s/p CABG in 2003, PAH, mod AS/AI, carotid dzs s/p R CEA, PAD, HTN, HL, DVT on chronic xarelto, CKD III, prostate CA, sciatica, and spinal stenosis, who was admitted from the office on 2/25 and found to have significant volume overload, AKI, and NSTEMI.    Assessment & Plan    1.  Non-ST elevation myocardial infarction: Peak troponin was 12.  Not a good candidate for cardiac catheterization at the present time due to acute on chronic renal failure and significant heart failure and hypoxia.  Continue unfractionated heparin. Previous catheterization in December showed possible occlusion of SVG to OM which could not be selectively engaged. If the patient's overall condition improved and renal function returns to  baseline, repeat heart catheterization can be done early next week via the femoral approach.  2.  Acute systolic heart failure: This is likely due to ischemic cardiomyopathy.  This was associated with significant pulmonary hypertension on echo.  Worsening respiratory status.  I personally reviewed his chest x-ray which shows worsening pulmonary edema.  The patient is still hypoxic and requiring high flow nasal cannula. It has been difficult to diurese him given his renal function. I elected to start small dose milrinone to see if he can tolerate short-term inotropic treatment. I  increased furosemide to 60 mg intravenously 3 times daily.  3.  Acute on chronic kidney disease: Likely due to cardiorenal syndrome.  Slight improvement with diuresis and hopefully this can improve  further with inotropic treatment.  4.  Essential hypertension: I discontinued nifedipine due to decreased ejection fraction.  Continue carvedilol and Imdur. 5.  History of DVT : On chronic Xarelto but currently is on heparin drip.  The patient's overall prognosis is poor.  I agree with palliative care consult. I discussed the case with nursing, Dr. Shawna Orleans and Dr. Juleen China.  For questions or updates, please contact [place] Please consult [email or web address] for contact info under Cardiology/STEMI.      Signed, [name], MD  05/04/2017, 8:22 AM

## 2017-05-04 NOTE — Progress Notes (Addendum)
Daily Progress Note   Patient Name: Dennis Burns       Date: 05/04/2017 DOB: 27-Mar-1928  Age: 84 y.o. MRN#: [id number] Attending Physician: Bettey Costa, MD Primary Care Physician: [name], MD Admit Date: 04/30/2017  Reason for Consultation/Follow-up: Establishing goals of care  Subjective: Dennis Burns is resting in bed on high flow O2. His wife and son at bedside. He denies feeling better.   We discussed diagnosis, prognosis, and GOC.  A detailed discussion was had today regarding advanced directives.  Concepts specific to code status, artifical feeding and hydration, continued IV antibiotics and rehospitalization was discussed.  The difference between an aggressive medical intervention path and a hospice comfort care path for this patient at this time was discussed.  Values and goals of care important to patient and family were attempted to be elicited.  They state it is difficult to picture what their quality of life will be at discharge, and in the future.  They state they have discussed the goals of care and upon further discussion today, he does not want chest compressions, shocks, a breathing tube, or to be placed on a ventilator. DNR/DNI. He may be willing to try dialysis, but would never want a feeding tube.   MOST form completed for DNR/DNI, no feeding tube.  Length of Stay: 4  Current Medications: Scheduled Meds:  . aspirin EC  81 mg Oral Daily  . atorvastatin  80 mg Oral Daily  . calcium citrate  200 mg of elemental calcium Oral Daily  . carvedilol  6.25 mg Oral BID WC  . cholecalciferol  4,000 Units Oral Daily  . ezetimibe  10 mg Oral Daily  . furosemide  60 mg Intravenous Q8H  . isosorbide mononitrate  60 mg Oral Daily  . latanoprost  1 drop Both Eyes QHS  .  levothyroxine  75 mcg Oral QAC breakfast  . multivitamin with minerals  1 tablet Oral Daily  . omega-3 acid ethyl esters   Oral BID  . sodium chloride flush  3 mL Intravenous Q12H  . timolol  1 drop Both Eyes BID    Continuous Infusions: . ceFEPime (MAXIPIME) IV Stopped (05/04/17 1318)  . heparin 1,150 Units/hr (05/04/17 0903)  . milrinone 0.25 mcg/kg/min (05/04/17 0913)    PRN Meds: acetaminophen, ALPRAZolam, ipratropium-albuterol, morphine injection, nitroGLYCERIN, nitroGLYCERIN,  ondansetron (ZOFRAN) IV, phenol, traMADol  Physical Exam  Constitutional: No distress.  HENT:  Head: Atraumatic.  Pulmonary/Chest:  High flow O2.  Neurological: He is alert.  Oriented.  Skin: Skin is warm and dry.            Vital Signs: BP (!) 89/50   Pulse 96   Temp 98.2 F (36.8 C) (Axillary)   Resp 17   Ht 5\' 3"  (1.6 m)   Wt 65.1 kg (143 lb 8.3 oz)   SpO2 92%   BMI 25.42 kg/m  SpO2: SpO2: 92 % O2 Device: O2 Device: High Flow Nasal Cannula O2 Flow Rate: O2 Flow Rate (L/min): 50 L/min  Intake/output summary:   Intake/Output Summary (Last 24 hours) at 05/04/2017 1404 Last data filed at 05/04/2017 1224 Gross per 24 hour  Intake 803.04 ml  Output 2675 ml  Net -1871.96 ml   LBM: Last BM Date: 05/02/17 Baseline Weight: Weight: 66.7 kg (147 lb) Most recent weight: Weight: 65.1 kg (143 lb 8.3 oz)       Palliative Assessment/Data: 30%    Flowsheet Rows     Most Recent Value  Intake Tab  Referral Department  Hospitalist  Unit at Time of Referral  Med/Surg Unit  Palliative Care Primary Diagnosis  Cardiac  Date Notified  05/03/17  Palliative Care Type  New Palliative care  Reason for referral  Clarify Goals of Care  Date of Admission  04/30/17  Date first seen by Palliative Care  05/03/17  # of days Palliative referral response time  0 Day(s)  # of days IP prior to Palliative referral  3  Clinical Assessment  Psychosocial & Spiritual Assessment  Palliative Care Outcomes       Patient Active Problem List   Diagnosis Date Noted  . NSTEMI (non-ST elevated myocardial infarction) ([place]) 04/30/2017  . Effort angina ([place]) 02/14/2017  . Positive cardiac stress test 02/04/2017  . Preventative health care 05/03/2016  . Pulmonary hypertension ([place]) 05/03/2016  . PAD (peripheral artery disease) ([place]) 04/18/2016  . DOE (dyspnea on exertion) 11/02/2015  . Spinal stenosis of lumbar region with radiculopathy   . Coronary artery disease   . History of prostate cancer   . Hyperlipidemia   . Hypertension   . Chronic kidney disease, stage III (moderate) (HCC)   . DVT, lower extremity, recurrent ([place])   . Glaucoma   . Hypothyroidism   . Impaired fasting glucose     Palliative Care Assessment & Plan   Patient Profile: Dennis Burns is an 84 year old man with history of CAD status post CABG, presenting with shortness of breath and elevated troponin complicated by acute/subacute kidney injury.      Assessment/Recommendations/Plan:  DNR/DNI, would not want a feeding tube.    Will continue to follow here, recommend palliative to follow outpatient.     Code Status:    Code Status Orders  (From admission, onward)        Start     Ordered   05/04/17 1404  Do not attempt resuscitation (DNR)  Continuous    Question Answer Comment  In the event of cardiac or respiratory ARREST Do not call a "code blue"   In the event of cardiac or respiratory ARREST Do not perform Intubation, CPR, defibrillation or ACLS   In the event of cardiac or respiratory ARREST Use medication by any route, position, wound care, and other measures to relive pain and suffering. May use oxygen, suction and manual treatment of airway obstruction as  needed for comfort.      05/04/17 1403    Code Status History    Date Active Date Inactive Code Status Order ID Comments User Context   04/30/2017 20:28 05/04/2017 14:03 Full Code [id number]  Gorden Harms, MD Inpatient   02/14/2017 16:45  02/15/2017 15:07 Full Code [id number]  Wellington Hampshire, MD Inpatient    Advance Directive Documentation     Most Recent Value  Type of Advance Directive  Healthcare Power of Attorney, Living will  Pre-existing out of facility DNR order (yellow form or pink MOST form)  No data  "MOST" Form in Place?  No data       Prognosis:   Unable to determine  Discharge Planning:  To Be Determined  Care plan was discussed with CCM, RN  Thank you for allowing the Palliative Medicine Team to assist in the care of this patient.   Time In:  1:10 Time Out: 2:20 Total Time 70 min Prolonged Time Billed  yes       Greater than 50%  of this time was spent counseling and coordinating care related to the above assessment and plan.  Asencion Gowda, NP  Please contact Palliative Medicine Team phone at [phone] for questions and concerns.

## 2017-05-04 NOTE — Progress Notes (Signed)
ANTICOAGULATION CONSULT NOTE   Pharmacy Consult for heparin drip Indication: chest pain/ACS  No Known Allergies  Patient Measurements: Height: 5\' 3"  (160 cm) Weight: 143 lb 8.3 oz (65.1 kg) IBW/kg (Calculated) : 56.9 Heparin Dosing Weight: 67 kg  Vital Signs: Temp: 98.2 F (36.8 C) (03/01 0830) Temp Source: Axillary (03/01 0830) BP: 87/49 (03/01 1500) Pulse Rate: 90 (03/01 1500)  Labs: Recent Labs    05/01/17 2248  05/02/17 3335 05/03/17 0402 05/03/17 0533 05/04/17 0502 05/04/17 1251  HGB  --    < > 10.7*  --  11.4* 11.2*  --   HCT  --   --  31.0*  --  34.8* 33.5*  --   PLT  --   --  278  --  359 333  --   APTT 66*  --  78*  --   --   --   --   HEPARINUNFRC  --    < > 0.53  --  0.44 0.22* 0.36  CREATININE  --   --  2.49* 2.05*  --  1.91*  --    < > = values in this interval not displayed.    Estimated Creatinine Clearance: 22.3 mL/min (A) (by C-G formula based on SCr of 1.91 mg/dL (H)).   Medical History: Past Medical History:  Diagnosis Date  . Allergic rhinitis due to pollen    as child--better now  . Chronic kidney disease, stage III (moderate) (HCC)   . Coronary artery disease   . DVT, lower extremity, recurrent ([place])   . Glaucoma    [facility]   . History of prostate cancer 2004  . Hyperlipidemia   . Hypertension   . Hypothyroidism   . Impaired fasting glucose   . Spinal stenosis of lumbar region with radiculopathy     Assessment: Pharmacy consulted to dose and monitor heparin drip in this 84 year old man for ACS. Patient on rivaroxaban as an outpatient and initially monitored via APTTs. Patient now being managed with anti-Xa levels and currently receiving heparin at 1150 units/hr and received 1000 units/hr    Goal of Therapy:  APTT 66-102 seconds Heparin level 0.3-0.7 units/ml Monitor platelets by anticoagulation protocol: Yes   Plan:  Continue heparin at 1150 units/hr and obtain confirmatory anti-Xa level 2100.  Pharmacy will continue to  monitor and adjust per consult.   MLS 05/04/2017

## 2017-05-04 NOTE — Progress Notes (Addendum)
ANTICOAGULATION CONSULT NOTE   Pharmacy Consult for heparin drip Indication: chest pain/ACS  No Known Allergies  Patient Measurements: Height: 5\' 3"  (160 cm) Weight: 143 lb 8.3 oz (65.1 kg) IBW/kg (Calculated) : 56.9 Heparin Dosing Weight: 67 kg  Vital Signs: Temp: 97.8 F (36.6 C) (03/01 0200) Temp Source: Oral (03/01 0200) BP: 113/60 (03/01 0600) Pulse Rate: 96 (03/01 0600)  Labs: Recent Labs    05/01/17 0810  05/01/17 1004 05/01/17 2248  05/02/17 0638 05/03/17 0402 05/03/17 0533 05/04/17 0502  HGB  --   --   --   --    < > 10.7*  --  11.4* 11.2*  HCT  --   --   --   --   --  31.0*  --  34.8* 33.5*  PLT  --   --   --   --   --  278  --  359 333  APTT  --   --  60* 66*  --  78*  --   --   --   HEPARINUNFRC  --    < > 1.40*  --   --  0.53  --  0.44 0.22*  CREATININE  --   --   --   --   --  2.49* 2.05*  --  1.91*  TROPONINI 6.92*  --   --   --   --   --   --   --   --    < > = values in this interval not displayed.    Estimated Creatinine Clearance: 22.3 mL/min (A) (by C-G formula based on SCr of 1.91 mg/dL (H)).   Medical History: Past Medical History:  Diagnosis Date  . Allergic rhinitis due to pollen    as child--better now  . Chronic kidney disease, stage III (moderate) (HCC)   . Coronary artery disease   . DVT, lower extremity, recurrent ([place])   . Glaucoma    [facility]   . History of prostate cancer 2004  . Hyperlipidemia   . Hypertension   . Hypothyroidism   . Impaired fasting glucose   . Spinal stenosis of lumbar region with radiculopathy     Assessment: Pharmacy consulted to dose and monitor heparin drip in this 84 year old man for ACS. Patient on rivaroxaban as an outpatient and initially monitored via APTTs. Patient now being managed with anti-Xa levels and currently receiving heparin at 1050 units/hr.    Goal of Therapy:  APTT 66-102 seconds Heparin level 0.3-0.7 units/ml Monitor platelets by anticoagulation protocol: Yes   Plan:   Continue heparin at 1050 units/hr and proceed with next scheduled anti-Xa level with am labs.   03/01 @ 0500 HL 0.22 subtherapeutic. Will bolus w/ heparin 1000 units IV x 1 and increase rate to 1150 units/hr and will recheck HL @ 1300. CBC stable.  Pharmacy will continue to monitor and adjust per consult.   Tobie Lords, PharmD, BCPS Clinical Pharmacist 05/04/2017

## 2017-05-04 NOTE — Progress Notes (Signed)
[facility]  ROUNDING NOTE   Subjective:   BIPAP overnight.  Foley placed yesterday. UOP improved.   Continues to have pulmonary edema.  Furosemide increased to 60mg  IV q8  Objective:  Vital signs in last 24 hours:  Temp:  [97.6 F (36.4 C)-100.5 F (38.1 C)] 97.8 F (36.6 C) (03/01 0200) Pulse Rate:  [83-126] 96 (03/01 0600) Resp:  [19-37] 23 (03/01 0600) BP: (100-132)/(54-72) 113/60 (03/01 0600) SpO2:  [88 %-96 %] 96 % (03/01 0600) FiO2 (%):  [70 %-100 %] 80 % (03/01 0415) Weight:  [65.1 kg (143 lb 8.3 oz)] 65.1 kg (143 lb 8.3 oz) (03/01 0500)  Weight change: 2.5 kg (5 lb 8.2 oz) Filed Weights   05/02/17 0525 05/03/17 0500 05/04/17 0500  Weight: 64.6 kg (142 lb 8 oz) 62.6 kg (138 lb 0.1 oz) 65.1 kg (143 lb 8.3 oz)    Intake/Output: I/O last 3 completed shifts: In: 799 [I.V.:699; IV Piggyback:100] Out: 3150 [Urine:3150]   Intake/Output this shift:  No intake/output data recorded.  Physical Exam: General: NAD,   Head: Normocephalic, atraumatic. Moist oral mucosal membranes  Eyes: Anicteric, PERRL  Neck: Supple, trachea midline  Lungs:  Bilateral crackles,  HFNC+  Heart: Regular rate and rhythm, +murmur  Abdomen:  Soft, nontender,   Extremities: no peripheral edema.  Neurologic: Nonfocal, moving all four extremities  Skin: No lesions        Basic Metabolic Panel: Recent Labs  Lab 04/30/17 1447 05/01/17 0413 05/02/17 0638 05/03/17 0402 05/04/17 0502  NA 134* 135 136 136 138  K 5.0 4.7 4.3 5.0 4.0  CL 102 103 106 104 103  CO2 17* 20* 18* 17* 20*  GLUCOSE 163* 129* 200* 196* 140*  BUN 94* 106* 105* 94* 100*  CREATININE 3.73* 3.70* 2.49* 2.05* 1.91*  CALCIUM 8.5* 8.2* 8.6* 8.6* 8.6*    Liver Function Tests: No results for input(s): AST, ALT, ALKPHOS, BILITOT, PROT, ALBUMIN in the last 168 hours. No results for input(s): LIPASE, AMYLASE in the last 168 hours. No results for input(s): AMMONIA in the last 168 hours.  CBC: Recent Labs   Lab 04/30/17 1447 05/01/17 0413 05/02/17 0638 05/03/17 0533 05/04/17 0502  WBC 9.9 7.4 6.1 14.2* 10.8*  HGB 11.0* 10.4* 10.7* 11.4* 11.2*  HCT 33.2* 30.8* 31.0* 34.8* 33.5*  MCV 91.9 91.6 90.0 91.1 91.0  PLT 264 234 278 359 333    Cardiac Enzymes: Recent Labs  Lab 04/30/17 1447 04/30/17 2108 05/01/17 0219 05/01/17 0810  TROPONINI 9.24* 12.08* 10.60* 6.92*    BNP: Invalid input(s): POCBNP  CBG: Recent Labs  Lab 05/03/17 0133  GLUCAP 280*    Microbiology: Results for orders placed or performed during the hospital encounter of 04/30/17  MRSA PCR Screening     Status: None   Collection Time: 05/03/17  1:32 AM  Result Value Ref Range Status   MRSA by PCR NEGATIVE NEGATIVE Final    Comment:        The GeneXpert MRSA Assay (FDA approved for NASAL specimens only), is one component of a comprehensive MRSA colonization surveillance program. It is not intended to diagnose MRSA infection nor to guide or monitor treatment for MRSA infections. Performed at [facility], [address] [place], [place] [address]     Coagulation Studies: No results for input(s): LABPROT, INR in the last 72 hours.  Urinalysis: No results for input(s): COLORURINE, LABSPEC, PHURINE, GLUCOSEU, HGBUR, BILIRUBINUR, KETONESUR, PROTEINUR, UROBILINOGEN, NITRITE, LEUKOCYTESUR in the last 72 hours.  Invalid  input(s): APPERANCEUR    Imaging: Dg Chest Port 1 View  Result Date: 05/04/2017 CLINICAL DATA:  Respiratory failure EXAM: PORTABLE CHEST 1 VIEW COMPARISON:  05/01/2017 FINDINGS: Cardiomegaly, symmetric interstitial and airspace opacity and small pleural effusions. Prior median sternotomy. History of carotid endarterectomy with clips on the right. IMPRESSION: CHF. Electronically Signed   By: Monte Fantasia M.D.   On: 05/04/2017 06:44     Medications:   . ceFEPime (MAXIPIME) IV Stopped (05/03/17 1056)  . heparin 1,050 Units/hr (05/03/17 2222)   . aspirin EC  81 mg Oral  Daily  . atorvastatin  80 mg Oral Daily  . budesonide (PULMICORT) nebulizer solution  0.5 mg Nebulization BID  . calcium citrate  200 mg of elemental calcium Oral Daily  . carvedilol  6.25 mg Oral BID WC  . cholecalciferol  4,000 Units Oral Daily  . ezetimibe  10 mg Oral Daily  . furosemide  60 mg Intravenous Q8H  . heparin  1,000 Units Intravenous Once  . ipratropium-albuterol  3 mL Nebulization Q4H  . isosorbide mononitrate  60 mg Oral Daily  . latanoprost  1 drop Both Eyes QHS  . levothyroxine  75 mcg Oral QAC breakfast  . multivitamin with minerals  1 tablet Oral Daily  . omega-3 acid ethyl esters   Oral BID  . sodium chloride flush  3 mL Intravenous Q12H  . timolol  1 drop Both Eyes BID   acetaminophen, ALPRAZolam, ipratropium-albuterol, morphine injection, nitroGLYCERIN, nitroGLYCERIN, ondansetron (ZOFRAN) IV, phenol, traMADol  Assessment/ Plan:  [name] is a 84 y.o. white male with hypertension, coronary artery disease, history of DVT, glaucoma, history of prostate cancer, hyperlipidemia, hypothyroidism, aortic stenosis, diastolic congestive heart failure who is admitted for acute coronary syndrome.   1. Acute renal failure on chronic kidney disease stage III: Baseline creatinine 1.48, GFR of 12/2016.  Acute renal failure from acute coronary syndrome and acute cardiorenal syndrome.  Renal ultrasound consistent with left sided renal artery stenosis.  Nonoliguric urine output. Creatinine improving.  Monitor urine output.  - Recommend renally dosing all medications. Appreciate cardiology input.  - holding valsartan  2. Hypertension: with acute diastolic/systolic congestive heart failure/aortic stenosis and coronary syndrome.  Acute respiratory failure with hypoxia. Appreciate critical care input - Continue furosemide 60mg  IV q8 - Continue cavedilol, imdur, nifedipine - Appreciate cardiology input. Milrinone gtt is being considered  3. Anemia of chronic kidney  disease: hemoglobin 11.4. No indication for ESA    LOS: 4 Dennis Burns 3/1/20198:37 AM

## 2017-05-04 NOTE — Progress Notes (Signed)
He is in no distress on HFNC at 90% He has been cycling on and off BiPAP He denies chest pain Cognition intact Palliative care has seen him and patient is now DNR cardiac arrest and DNI in event of respiratory failure  Vitals:   05/04/17 1000 05/04/17 1100 05/04/17 1200 05/04/17 1300  BP: 105/62 (!) 91/52 (!) 83/48 (!) 89/50  Pulse: 88 97 94 96  Resp: (!) 22 (!) 24 (!) 27 17  Temp:      TempSrc:      SpO2: 94% 95% 90% 92%  Weight:      Height:         Gen: WDWN in NAD on HFNC O2 HEENT: NCAT, sclerae white, oropharynx normal Neck: No LAN, no JVD noted Lungs: Widely diminished BS with bilateral crackles, no wheezes Cardiovascular: Reg, 2 component systolic M Abdomen: Soft, NT, +BS Ext: Warm, no edema  Neuro: PERRL, EOMI, motor/sensory grossly intact Skin: No lesions noted  BMP Latest Ref Rng & Units 05/04/2017 05/03/2017 05/02/2017  Glucose 65 - 99 mg/dL 140(H) 196(H) 200(H)  BUN 6 - 20 mg/dL 100(H) 94(H) 105(H)  Creatinine 0.61 - 1.24 mg/dL 1.91(H) 2.05(H) 2.49(H)  BUN/Creat Ratio 10 - 24 - - -  Sodium 135 - 145 mmol/L 138 136 136  Potassium 3.5 - 5.1 mmol/L 4.0 5.0 4.3  Chloride 101 - 111 mmol/L 103 104 106  CO2 22 - 32 mmol/L 20(L) 17(L) 18(L)  Calcium 8.9 - 10.3 mg/dL 8.6(L) 8.6(L) 8.6(L)    CBC Latest Ref Rng & Units 05/04/2017 05/03/2017 05/02/2017  WBC 3.8 - 10.6 K/uL 10.8(H) 14.2(H) 6.1  Hemoglobin 13.0 - 18.0 g/dL 11.2(L) 11.4(L) 10.7(L)  Hematocrit 40.0 - 52.0 % 33.5(L) 34.8(L) 31.0(L)  Platelets 150 - 440 K/uL 333 359 278    CXR: CM, edema pattern with B effusions   IMPRESSION: NSTEMI  Ischemic cardiomyopathy Aortic stenosis Mitral regurgitation History of pulmonary hypertension Refractory pulmonary edema Doubt pneumonia History of DVT AKI/CKD DNR/DNI  PLAN/REC:  Continue supplemental oxygen to maintain SPO2 >90% Continue BiPAP as needed for respiratory distress Continue diuresis as permitted by BP and renal function Milrinone infusion initiated by  cardiology Holding rivaroxaban while on heparin infusion Discontinue cefepime    Merton Border, MD PCCM service Mobile [phone] Pager [phone] 05/04/2017 3:07 PM

## 2017-05-05 LAB — BASIC METABOLIC PANEL
ANION GAP: 14 (ref 5–15)
BUN: 101 mg/dL — ABNORMAL HIGH (ref 6–20)
CALCIUM: 8.4 mg/dL — AB (ref 8.9–10.3)
CO2: 24 mmol/L (ref 22–32)
CREATININE: 1.97 mg/dL — AB (ref 0.61–1.24)
Chloride: 100 mmol/L — ABNORMAL LOW (ref 101–111)
GFR, EST AFRICAN AMERICAN: 34 mL/min — AB (ref >60)
GFR, EST NON AFRICAN AMERICAN: 29 mL/min — AB (ref >60)
Glucose, Bld: 148 mg/dL — ABNORMAL HIGH (ref 65–99)
Potassium: 3.8 mmol/L (ref 3.5–5.1)
SODIUM: 138 mmol/L (ref 135–145)

## 2017-05-05 LAB — CBC
HCT: 31.3 % — ABNORMAL LOW (ref 40.0–52.0)
HEMOGLOBIN: 10.4 g/dL — AB (ref 13.0–18.0)
MCH: 29.9 pg (ref 26.0–34.0)
MCHC: 33.2 g/dL (ref 32.0–36.0)
MCV: 90.1 fL (ref 80.0–100.0)
Platelets: 338 10*3/uL (ref 150–440)
RBC: 3.47 MIL/uL — AB (ref 4.40–5.90)
RDW: 14.6 % — ABNORMAL HIGH (ref 11.5–14.5)
WBC: 10 10*3/uL (ref 3.8–10.6)

## 2017-05-05 LAB — HEPARIN LEVEL (UNFRACTIONATED)
HEPARIN UNFRACTIONATED: 0.5 [IU]/mL (ref 0.30–0.70)
Heparin Unfractionated: 0.41 IU/mL (ref 0.30–0.70)

## 2017-05-05 LAB — BRAIN NATRIURETIC PEPTIDE: B NATRIURETIC PEPTIDE 5: 2914 pg/mL — AB (ref 0.0–100.0)

## 2017-05-05 MED ORDER — SENNA 8.6 MG PO TABS
1.0000 | ORAL_TABLET | Freq: Every day | ORAL | Status: DC
  Administered 2017-05-05 – 2017-05-09 (×3): 8.6 mg via ORAL
  Filled 2017-05-05 (×4): qty 1

## 2017-05-05 MED ORDER — DOCUSATE SODIUM 100 MG PO CAPS
100.0000 mg | ORAL_CAPSULE | Freq: Two times a day (BID) | ORAL | Status: DC
  Administered 2017-05-06 – 2017-05-10 (×6): 100 mg via ORAL
  Filled 2017-05-05 (×7): qty 1

## 2017-05-05 MED ORDER — CLOPIDOGREL BISULFATE 75 MG PO TABS
75.0000 mg | ORAL_TABLET | Freq: Every day | ORAL | Status: DC
  Administered 2017-05-05 – 2017-05-06 (×2): 75 mg via ORAL
  Filled 2017-05-05 (×2): qty 1

## 2017-05-05 MED ORDER — HEPARIN BOLUS VIA INFUSION
1000.0000 [IU] | Freq: Once | INTRAVENOUS | Status: AC
  Administered 2017-05-05: 1000 [IU] via INTRAVENOUS
  Filled 2017-05-05: qty 1000

## 2017-05-05 NOTE — Progress Notes (Addendum)
Progress Note  Patient Name: Dennis Burns Date of Encounter: 05/05/2017  Primary Cardiologist: Ida Rogue, MD   Subjective   Dyspnea improving; no chest pain  Inpatient Medications    Scheduled Meds: . aspirin EC  81 mg Oral Daily  . atorvastatin  80 mg Oral Daily  . calcium citrate  200 mg of elemental calcium Oral Daily  . carvedilol  6.25 mg Oral BID WC  . cholecalciferol  4,000 Units Oral Daily  . ezetimibe  10 mg Oral Daily  . furosemide  60 mg Intravenous Q8H  . isosorbide mononitrate  60 mg Oral Daily  . latanoprost  1 drop Both Eyes QHS  . levothyroxine  75 mcg Oral QAC breakfast  . multivitamin with minerals  1 tablet Oral Daily  . omega-3 acid ethyl esters   Oral BID  . sodium chloride flush  3 mL Intravenous Q12H  . timolol  1 drop Both Eyes BID   Continuous Infusions: . heparin 1,250 Units/hr (05/05/17 0900)  . milrinone 0.25 mcg/kg/min (05/05/17 0900)   PRN Meds: acetaminophen, ALPRAZolam, ipratropium-albuterol, morphine injection, nitroGLYCERIN, nitroGLYCERIN, ondansetron (ZOFRAN) IV, phenol, traMADol   Vital Signs    Vitals:   05/05/17 0700 05/05/17 0800 05/05/17 0900 05/05/17 1000  BP: 112/61 109/78 106/66 (!) 100/55  Pulse: 89 79 91 91  Resp: 17 20 (!) 25 19  Temp:  98.2 F (36.8 C)    TempSrc:  Oral    SpO2: 97% 93% 95% 90%  Weight:      Height:        Intake/Output Summary (Last 24 hours) at 05/05/2017 1027 Last data filed at 05/05/2017 0930 Gross per 24 hour  Intake 503.85 ml  Output 2525 ml  Net -2021.15 ml   Filed Weights   05/02/17 0525 05/03/17 0500 05/04/17 0500  Weight: 142 lb 8 oz (64.6 kg) 138 lb 0.1 oz (62.6 kg) 143 lb 8.3 oz (65.1 kg)    Telemetry    Sinus with PACs and PVCs; isolated couplet; brief PAT- Personally Reviewed   Physical Exam   GEN: No acute distress.   Neck: supple Cardiac: RRR, 3/6 systolic murmur LSB Respiratory: Dimiinished BS bases GI: Soft, nontender, non-distended  MS: No edema Neuro:   Nonfocal  Psych: Normal affect   Labs    Chemistry Recent Labs  Lab 05/03/17 0402 05/04/17 0502 05/05/17 0508  NA 136 138 138  K 5.0 4.0 3.8  CL 104 103 100*  CO2 17* 20* 24  GLUCOSE 196* 140* 148*  BUN 94* 100* 101*  CREATININE 2.05* 1.91* 1.97*  CALCIUM 8.6* 8.6* 8.4*  GFRNONAA 28* 30* 29*  GFRAA 32* 35* 34*  ANIONGAP 15 15 14      Hematology Recent Labs  Lab 05/03/17 0533 05/04/17 0502 05/05/17 0508  WBC 14.2* 10.8* 10.0  RBC 3.81* 3.68* 3.47*  HGB 11.4* 11.2* 10.4*  HCT 34.8* 33.5* 31.3*  MCV 91.1 91.0 90.1  MCH 30.0 30.5 29.9  MCHC 32.9 33.5 33.2  RDW 15.3* 15.1* 14.6*  PLT 359 333 338    Cardiac Enzymes Recent Labs  Lab 04/30/17 1447 04/30/17 2108 05/01/17 0219 05/01/17 0810  TROPONINI 9.24* 12.08* 10.60* 6.92*    BNP Recent Labs  Lab 04/30/17 1741 04/30/17 2108 05/05/17 0508  BNP 4,309.0* 4,115.0* 2,914.0*     Radiology    Dg Chest Port 1 View  Result Date: 05/04/2017 CLINICAL DATA:  Respiratory failure EXAM: PORTABLE CHEST 1 VIEW COMPARISON:  05/01/2017 FINDINGS: Cardiomegaly, symmetric interstitial and airspace  opacity and small pleural effusions. Prior median sternotomy. History of carotid endarterectomy with clips on the right. IMPRESSION: CHF. Electronically Signed   By: Monte Fantasia M.D.   On: 05/04/2017 06:44    Patient Profile     84 y.o. male w/ a h/o CAD s/p CABG in 2003, PAH, mod AS/AI, carotid dzs s/p R CEA, PAD, HTN, HL, DVT on chronic xarelto, CKD III, prostate CA, sciatica, and spinal stenosis, who was admitted from the office on 2/25 and found to have significant volume overload, AKI, and NSTEMI.    Assessment & Plan    1 non-ST elevation myocardial infarction-patient denies chest pain this morning.  Given severity of renal insufficiency I do not think he would be a good candidate for cardiac catheterization.  Would plan medical therapy.  Continue aspirin, heparin, statin and beta-blocker.  Add Plavix 75 mg daily.  2  acute on chronic combined systolic/diastolic congestive heart failure-I/O - 2070; renal function relatively stable compared to yesterday.  Will continue present dose of Lasix and follow.  Clinically he is improving.  Will also continue milrinone for now.  3 valvular heart disease-patient with moderate aortic stenosis, moderate aortic insufficiency and moderate to severe mitral regurgitation.  Likely contributing to congestive heart failure.  4 acute on chronic stage IV kidney disease-follow renal function closely with diuresis.    5 Hypertension-blood pressure is controlled.  Continue present medications.  6 no CODE BLUE  7 history of DVT-continue heparin.  Will need to reassess Xarelto long-term based on renal function after diuresis.  For questions or updates, please contact [place] Please consult [email or web address] for contact info under Cardiology/STEMI.      Signed, Kirk Ruths, MD  05/05/2017, 10:27 AM

## 2017-05-05 NOTE — Progress Notes (Signed)
He remains in no distress on HFNC.  We are decreasing FiO2.  Presently 50% He has not required BiPAP since yesterday He continues to deny chest pain His cognition remains intact No new complaints  Vitals:   05/05/17 0800 05/05/17 0900 05/05/17 0907 05/05/17 1000  BP: 109/78 106/66  (!) 100/55  Pulse: 79 91  91  Resp: 20 (!) 25  19  Temp: 98.2 F (36.8 C)     TempSrc: Oral     SpO2: 93% 95% 95% 90%  Weight:      Height:         Gen: NAD HEENT: NCAT, sclerae white, oropharynx normal Neck: No LAN, no JVD noted Lungs: Bilateral crackles, no wheezes Cardiovascular: Reg, systolic M unchanged Abdomen: Soft, NT, +BS Ext: Warm, no edema  Neuro: Remains grossly intact  BMP Latest Ref Rng & Units 05/05/2017 05/04/2017 05/03/2017  Glucose 65 - 99 mg/dL 148(H) 140(H) 196(H)  BUN 6 - 20 mg/dL 101(H) 100(H) 94(H)  Creatinine 0.61 - 1.24 mg/dL 1.97(H) 1.91(H) 2.05(H)  BUN/Creat Ratio 10 - 24 - - -  Sodium 135 - 145 mmol/L 138 138 136  Potassium 3.5 - 5.1 mmol/L 3.8 4.0 5.0  Chloride 101 - 111 mmol/L 100(L) 103 104  CO2 22 - 32 mmol/L 24 20(L) 17(L)  Calcium 8.9 - 10.3 mg/dL 8.4(L) 8.6(L) 8.6(L)    CBC Latest Ref Rng & Units 05/05/2017 05/04/2017 05/03/2017  WBC 3.8 - 10.6 K/uL 10.0 10.8(H) 14.2(H)  Hemoglobin 13.0 - 18.0 g/dL 10.4(L) 11.2(L) 11.4(L)  Hematocrit 40.0 - 52.0 % 31.3(L) 33.5(L) 34.8(L)  Platelets 150 - 440 K/uL 338 333 359    CXR: No new film   IMPRESSION: Recent NSTEMI  Ischemic cardiomyopathy Aortic stenosis Mitral regurgitation History of pulmonary hypertension Acute pulmonary edema History of DVT AKI/CKD -creatinine relatively stable at 1.97, not far from baseline and improved since admission DNR/DNI  PLAN/REC:  Continue supplemental oxygen to maintain SPO2 >90%.  Wean off HFNC as able Continue diuresis as permitted by BP and renal function Milrinone infusion per cardiology Remains on heparin infusion, holding rivaroxaban  He needs to remain in the stepdown  unit as long as he is on HFNC and milrinone infusion   Merton Border, MD PCCM service Mobile [phone] Pager [phone] 05/05/2017 11:31 AM

## 2017-05-05 NOTE — Progress Notes (Signed)
[facility] [place] NAME: Dennis Burns    MR#:  [id number]  DATE OF BIRTH:  [birth year]  SUBJECTIVE:   Patient remains on high flow nasal cannula. No acute events overnight. Patient is on [place] gtt.  REVIEW OF SYSTEMS:    Review of Systems  Constitutional: Negative for fever, chills weight loss HENT: Negative for ear pain, nosebleeds, congestion, facial swelling, rhinorrhea, neck pain, neck stiffness and ear discharge.   Respiratory: Negative for cough, ++shortness of breath (better), NO wheezing  Cardiovascular: Negative for chest pain, palpitations and leg swelling.  Gastrointestinal: Negative for heartburn, abdominal pain, vomiting, diarrhea or consitpation Genitourinary: Negative for dysuria, urgency, frequency, hematuria Musculoskeletal: Negative for back pain or joint pain Neurological: Negative for dizziness, seizures, syncope, focal weakness,  numbness and headaches.  Hematological: Does not bruise/bleed easily.  Psychiatric/Behavioral: Negative for hallucinations, confusion, dysphoric mood    Tolerating Diet: yes      DRUG ALLERGIES:  No Known Allergies  VITALS:  Blood pressure (!) 100/55, pulse 91, temperature 98.2 F (36.8 C), temperature source Oral, resp. rate 19, height 5\' 3"  (1.6 m), weight 65.1 kg (143 lb 8.3 oz), SpO2 90 %.  PHYSICAL EXAMINATION:  Constitutional: Appears well-developed and well-nourished. No distress. HENT: Normocephalic. Patient on HFNCEyes: Conjunctivae and EOM are normal. PERRLA, no scleral icterus.  Neck: Normal ROM. Neck supple. No tracheal deviation. CVS: Tachycardia S1/S2 +, 2/6 SEM,  no gallops, no carotid bruit.  Pulmonary: Effort and breath sounds normal, no stridor, rhonchi, wheezes, rales.  Abdominal: Soft. BS +,  no distension, tenderness, rebound or guarding.  Musculoskeletal: Normal range of motion. No edema and no tenderness.  Neuro: Alert. CN 2-12 grossly intact. No focal  deficits. Skin: Skin is warm and dry. No rash noted. Psychiatric: Normal mood and affect.      LABORATORY PANEL:   CBC Recent Labs  Lab 05/05/17 0508  WBC 10.0  HGB 10.4*  HCT 31.3*  PLT 338   ------------------------------------------------------------------------------------------------------------------  Chemistries  Recent Labs  Lab 05/05/17 0508  NA 138  K 3.8  CL 100*  CO2 24  GLUCOSE 148*  BUN 101*  CREATININE 1.97*  CALCIUM 8.4*   ------------------------------------------------------------------------------------------------------------------  Cardiac Enzymes Recent Labs  Lab 04/30/17 2108 05/01/17 0219 05/01/17 0810  TROPONINI 12.08* 10.60* 6.92*   ------------------------------------------------------------------------------------------------------------------  RADIOLOGY:  Dg Chest Port 1 View  Result Date: 05/04/2017 CLINICAL DATA:  Respiratory failure EXAM: PORTABLE CHEST 1 VIEW COMPARISON:  05/01/2017 FINDINGS: Cardiomegaly, symmetric interstitial and airspace opacity and small pleural effusions. Prior median sternotomy. History of carotid endarterectomy with clips on the right. IMPRESSION: CHF. Electronically Signed   By: Monte Fantasia M.D.   On: 05/04/2017 06:44     ASSESSMENT AND PLAN:  84 year old male with history of CAD status post CABG, PAD, hypertension , chronic kidney disease stage III and DVT on anticoagulation who presented due to shortness of breath.  1. Non-ST elevation MI: Troponin max is 12.08 Due to acute on chronic kidney disease he is currently not a candidate for cardiac catheterization Continue aspirin, statin, Coreg Continue heparin drip Cardiology consultation appreciated Echocardiogram shows ejection fraction 35-40% with probable hypokinesis of the apical anterior,lateral, and apical myocardium. Probable hypokinesis of the basal-midinferolateral myocardium Possible cardiac catheterization at a later date as  recommended by cardiology  2. Acute hypoxic respiratory failure due to Acute on chronic combined systolic and diastolic heart failure with ejection fraction 35-40%  heart failure and pulmonary hypertension:  Continue  to wean to nasal cannula as tolerated  Continue IV Lasix and MILRINONE for inotropic support. Monitor intake and output daily weight Echo shows elevated right heart pressure.  3. Hyperlipidemia: Continues Zetia and Lipitor  4. Hypothyroidism: Continue Synthroid  5. Essential hypertension: Continue Coreg  and isosorbide 6. Acute on chronic kidney disease stage III: Nephrology consultation appreciated acute kidney injury in the setting of cardiorenal syndrome  Creatinine improving with diuresis   Renal ultrasound reviewed, no obstruction seen.   7. History of DVT: Currently on heparin xarelto on hold due to this Dopplers were negative for acute DVT    Management plans discussed with the patient and he is  CODE STATUS: full  TOTAL TIME TAKING CARE OF THIS PATIENT: 22 minutes.  D/w dr simonds  POSSIBLE D/C 3-4 days, DEPENDING ON CLINICAL CONDITION.   Ayannah Faddis M.D on 05/05/2017 at 11:16 AM  Between 7am to 6pm - Pager - [phone] After 6pm go to [email or web address] - password [email or web address] [facility]  Office  [phone]  CC: Primary care physician; [name], MD  Note: This dictation was prepared with Dragon dictation along with smaller phrase technology. Any transcriptional errors that result from this process are unintentional.

## 2017-05-05 NOTE — Progress Notes (Signed)
ANTICOAGULATION CONSULT NOTE   Pharmacy Consult for heparin drip Indication: chest pain/ACS  No Known Allergies  Patient Measurements: Height: 5\' 3"  (160 cm) Weight: 143 lb 8.3 oz (65.1 kg) IBW/kg (Calculated) : 56.9 Heparin Dosing Weight: 67 kg  Vital Signs: Temp: 98.2 F (36.8 C) (03/02 0800) Temp Source: Oral (03/02 0800) BP: 102/58 (03/02 1600) Pulse Rate: 93 (03/02 1600)  Labs: Recent Labs    05/03/17 0402  05/03/17 0533 05/04/17 0502  05/04/17 2208 05/05/17 0508 05/05/17 0820 05/05/17 1549  HGB  --    < > 11.4* 11.2*  --   --  10.4*  --   --   HCT  --   --  34.8* 33.5*  --   --  31.3*  --   --   PLT  --   --  359 333  --   --  338  --   --   HEPARINUNFRC  --    < > 0.44 0.22*   < > 0.29*  --  0.50 0.41  CREATININE 2.05*  --   --  1.91*  --   --  1.97*  --   --    < > = values in this interval not displayed.    Estimated Creatinine Clearance: 21.7 mL/min (A) (by C-G formula based on SCr of 1.97 mg/dL (H)).   Medical History: Past Medical History:  Diagnosis Date  . Allergic rhinitis due to pollen    as child--better now  . Chronic kidney disease, stage III (moderate) (HCC)   . Coronary artery disease   . DVT, lower extremity, recurrent ([place])   . Glaucoma    [facility]   . History of prostate cancer 2004  . Hyperlipidemia   . Hypertension   . Hypothyroidism   . Impaired fasting glucose   . Spinal stenosis of lumbar region with radiculopathy     Assessment: Pharmacy consulted to dose and monitor heparin drip in this 84 year old man for ACS. Patient on rivaroxaban as an outpatient and initially monitored via APTTs. Patient now being managed with anti-Xa levels and currently receiving heparin at 1150 units/hr   Goal of Therapy:  APTT 66-102 seconds Heparin level 0.3-0.7 units/ml Monitor platelets by anticoagulation protocol: Yes   Plan:  Bolus heparin 1000 units and increase rate to 1250 units/hr and obtain anti-Xa level 0800.   3/2 08:20 HL =  0.50.  Continue current drip rate.  Recheck HL today at 16:00. 3/2 15:49 HL = 0.41.  Continue current drip rate.  Recheck HL tomorrow at 0500.  Pharmacy will continue to monitor and adjust per consult.   Donna Christen Quinteria Chisum, [facility] 05/05/2017, 9:46 AM

## 2017-05-05 NOTE — Progress Notes (Signed)
ANTICOAGULATION CONSULT NOTE   Pharmacy Consult for heparin drip Indication: chest pain/ACS  No Known Allergies  Patient Measurements: Height: 5\' 3"  (160 cm) Weight: 143 lb 8.3 oz (65.1 kg) IBW/kg (Calculated) : 56.9 Heparin Dosing Weight: 67 kg  Vital Signs: Temp: 98 F (36.7 C) (03/01 2000) Temp Source: Oral (03/01 2000) BP: 90/49 (03/01 2100) Pulse Rate: 88 (03/01 2351)  Labs: Recent Labs    05/02/17 2924 05/03/17 0402 05/03/17 0533 05/04/17 0502 05/04/17 1251 05/04/17 2208  HGB 10.7*  --  11.4* 11.2*  --   --   HCT 31.0*  --  34.8* 33.5*  --   --   PLT 278  --  359 333  --   --   APTT 78*  --   --   --   --   --   HEPARINUNFRC 0.53  --  0.44 0.22* 0.36 0.29*  CREATININE 2.49* 2.05*  --  1.91*  --   --     Estimated Creatinine Clearance: 22.3 mL/min (A) (by C-G formula based on SCr of 1.91 mg/dL (H)).   Medical History: Past Medical History:  Diagnosis Date  . Allergic rhinitis due to pollen    as child--better now  . Chronic kidney disease, stage III (moderate) (HCC)   . Coronary artery disease   . DVT, lower extremity, recurrent ([place])   . Glaucoma    [facility]   . History of prostate cancer 2004  . Hyperlipidemia   . Hypertension   . Hypothyroidism   . Impaired fasting glucose   . Spinal stenosis of lumbar region with radiculopathy     Assessment: Pharmacy consulted to dose and monitor heparin drip in this 84 year old man for ACS. Patient on rivaroxaban as an outpatient and initially monitored via APTTs. Patient now being managed with anti-Xa levels and currently receiving heparin at 1150 units/hr   Goal of Therapy:  APTT 66-102 seconds Heparin level 0.3-0.7 units/ml Monitor platelets by anticoagulation protocol: Yes   Plan:  Bolus heparin 1000 units and increase rate to 1250 units/hr and obtain anti-Xa level 0800.  Pharmacy will continue to monitor and adjust per consult.   MLS 05/05/2017

## 2017-05-05 NOTE — Progress Notes (Signed)
[facility]  ROUNDING NOTE   Subjective:   Wife at bedside.   HFNC O2  UOP 2575  Creatinine 1.97 (1.91)  On Milrinone gtt  Objective:  Vital signs in last 24 hours:  Temp:  [98 F (36.7 C)] 98 F (36.7 C) (03/01 2000) Pulse Rate:  [79-97] 89 (03/02 0700) Resp:  [16-30] 17 (03/02 0700) BP: (83-112)/(48-62) 112/61 (03/02 0700) SpO2:  [90 %-98 %] 97 % (03/02 0700) FiO2 (%):  [60 %-87 %] 85 % (03/02 0814)  Weight change:  Filed Weights   05/02/17 0525 05/03/17 0500 05/04/17 0500  Weight: 64.6 kg (142 lb 8 oz) 62.6 kg (138 lb 0.1 oz) 65.1 kg (143 lb 8.3 oz)    Intake/Output: I/O last 3 completed shifts: In: 625.7 [I.V.:525.7; IV Piggyback:100] Out: 0454 [Urine:4225]   Intake/Output this shift:  No intake/output data recorded.  Physical Exam: General: NAD,   Head: Normocephalic, atraumatic. Moist oral mucosal membranes  Eyes: Anicteric, PERRL  Neck: Supple, trachea midline  Lungs:  Bilateral crackles,  HFNC+  Heart: Regular rate and rhythm, +murmur  Abdomen:  Soft, nontender,   Extremities: no peripheral edema.  Neurologic: Nonfocal, moving all four extremities  Skin: No lesions        Basic Metabolic Panel: Recent Labs  Lab 05/01/17 0413 05/02/17 0981 05/03/17 0402 05/04/17 0502 05/05/17 0508  NA 135 136 136 138 138  K 4.7 4.3 5.0 4.0 3.8  CL 103 106 104 103 100*  CO2 20* 18* 17* 20* 24  GLUCOSE 129* 200* 196* 140* 148*  BUN 106* 105* 94* 100* 101*  CREATININE 3.70* 2.49* 2.05* 1.91* 1.97*  CALCIUM 8.2* 8.6* 8.6* 8.6* 8.4*    Liver Function Tests: No results for input(s): AST, ALT, ALKPHOS, BILITOT, PROT, ALBUMIN in the last 168 hours. No results for input(s): LIPASE, AMYLASE in the last 168 hours. No results for input(s): AMMONIA in the last 168 hours.  CBC: Recent Labs  Lab 05/01/17 0413 05/02/17 1914 05/03/17 0533 05/04/17 0502 05/05/17 0508  WBC 7.4 6.1 14.2* 10.8* 10.0  HGB 10.4* 10.7* 11.4* 11.2* 10.4*  HCT 30.8*  31.0* 34.8* 33.5* 31.3*  MCV 91.6 90.0 91.1 91.0 90.1  PLT 234 278 359 333 338    Cardiac Enzymes: Recent Labs  Lab 04/30/17 1447 04/30/17 2108 05/01/17 0219 05/01/17 0810  TROPONINI 9.24* 12.08* 10.60* 6.92*    BNP: Invalid input(s): POCBNP  CBG: Recent Labs  Lab 05/03/17 0133  GLUCAP 280*    Microbiology: Results for orders placed or performed during the hospital encounter of 04/30/17  MRSA PCR Screening     Status: None   Collection Time: 05/03/17  1:32 AM  Result Value Ref Range Status   MRSA by PCR NEGATIVE NEGATIVE Final    Comment:        The GeneXpert MRSA Assay (FDA approved for NASAL specimens only), is one component of a comprehensive MRSA colonization surveillance program. It is not intended to diagnose MRSA infection nor to guide or monitor treatment for MRSA infections. Performed at [facility], [address] [place], [place] [address]     Coagulation Studies: No results for input(s): LABPROT, INR in the last 72 hours.  Urinalysis: No results for input(s): COLORURINE, LABSPEC, PHURINE, GLUCOSEU, HGBUR, BILIRUBINUR, KETONESUR, PROTEINUR, UROBILINOGEN, NITRITE, LEUKOCYTESUR in the last 72 hours.  Invalid input(s): APPERANCEUR    Imaging: Dg Chest Port 1 View  Result Date: 05/04/2017 CLINICAL DATA:  Respiratory failure EXAM: PORTABLE CHEST 1 VIEW COMPARISON:  05/01/2017  FINDINGS: Cardiomegaly, symmetric interstitial and airspace opacity and small pleural effusions. Prior median sternotomy. History of carotid endarterectomy with clips on the right. IMPRESSION: CHF. Electronically Signed   By: Monte Fantasia M.D.   On: 05/04/2017 06:44     Medications:   . heparin 1,250 Units/hr (05/05/17 0700)  . milrinone 0.25 mcg/kg/min (05/05/17 0700)   . aspirin EC  81 mg Oral Daily  . atorvastatin  80 mg Oral Daily  . calcium citrate  200 mg of elemental calcium Oral Daily  . carvedilol  6.25 mg Oral BID WC  . cholecalciferol  4,000  Units Oral Daily  . ezetimibe  10 mg Oral Daily  . furosemide  60 mg Intravenous Q8H  . isosorbide mononitrate  60 mg Oral Daily  . latanoprost  1 drop Both Eyes QHS  . levothyroxine  75 mcg Oral QAC breakfast  . multivitamin with minerals  1 tablet Oral Daily  . omega-3 acid ethyl esters   Oral BID  . sodium chloride flush  3 mL Intravenous Q12H  . timolol  1 drop Both Eyes BID   acetaminophen, ALPRAZolam, ipratropium-albuterol, morphine injection, nitroGLYCERIN, nitroGLYCERIN, ondansetron (ZOFRAN) IV, phenol, traMADol  Assessment/ Plan:  [name] is a 84 y.o. white male with hypertension, coronary artery disease, history of DVT, glaucoma, history of prostate cancer, hyperlipidemia, hypothyroidism, aortic stenosis, diastolic congestive heart failure who is admitted for acute coronary syndrome.   1. Acute renal failure on chronic kidney disease stage III: Baseline creatinine 1.48, GFR of 12/2016. Bland urine. Acute renal failure from acute coronary syndrome and acute cardiorenal syndrome.  Renal ultrasound consistent with left sided renal artery stenosis.  Nonoliguric urine output. Creatinine improving.  Monitor urine output.  - Recommend renally dosing all medications. Appreciate cardiology input.  - holding valsartan  2. Hypertension: with acute diastolic/systolic congestive heart failure/aortic stenosis and coronary syndrome.  Acute respiratory failure with hypoxia. Appreciate critical care input - Continue furosemide 60mg  IV q8 - Continue cavedilol, imdur, nifedipine - Appreciate cardiology input.  - Continue Milrinone gtt   3. Anemia of chronic kidney disease: hemoglobin 10.4. No indication for ESA    LOS: 5 Thedore Pickel 3/2/20199:21 AM

## 2017-05-05 NOTE — Progress Notes (Signed)
ANTICOAGULATION CONSULT NOTE   Pharmacy Consult for heparin drip Indication: chest pain/ACS  No Known Allergies  Patient Measurements: Height: 5\' 3"  (160 cm) Weight: 143 lb 8.3 oz (65.1 kg) IBW/kg (Calculated) : 56.9 Heparin Dosing Weight: 67 kg  Vital Signs: BP: 106/66 (03/02 0900) Pulse Rate: 91 (03/02 0900)  Labs: Recent Labs    05/03/17 0402  05/03/17 0533 05/04/17 0502 05/04/17 1251 05/04/17 2208 05/05/17 0508 05/05/17 0820  HGB  --    < > 11.4* 11.2*  --   --  10.4*  --   HCT  --   --  34.8* 33.5*  --   --  31.3*  --   PLT  --   --  359 333  --   --  338  --   HEPARINUNFRC  --    < > 0.44 0.22* 0.36 0.29*  --  0.50  CREATININE 2.05*  --   --  1.91*  --   --  1.97*  --    < > = values in this interval not displayed.    Estimated Creatinine Clearance: 21.7 mL/min (A) (by C-G formula based on SCr of 1.97 mg/dL (H)).   Medical History: Past Medical History:  Diagnosis Date  . Allergic rhinitis due to pollen    as child--better now  . Chronic kidney disease, stage III (moderate) (HCC)   . Coronary artery disease   . DVT, lower extremity, recurrent ([place])   . Glaucoma    [facility]   . History of prostate cancer 2004  . Hyperlipidemia   . Hypertension   . Hypothyroidism   . Impaired fasting glucose   . Spinal stenosis of lumbar region with radiculopathy     Assessment: Pharmacy consulted to dose and monitor heparin drip in this 84 year old man for ACS. Patient on rivaroxaban as an outpatient and initially monitored via APTTs. Patient now being managed with anti-Xa levels and currently receiving heparin at 1150 units/hr   Goal of Therapy:  APTT 66-102 seconds Heparin level 0.3-0.7 units/ml Monitor platelets by anticoagulation protocol: Yes   Plan:  Bolus heparin 1000 units and increase rate to 1250 units/hr and obtain anti-Xa level 0800.   3/2 08:20 HL = 0.50.  Continue current drip rate.  Recheck HL today at 16:00.  Pharmacy will continue to  monitor and adjust per consult.   Olivia Canter, [facility] 05/05/2017, 9:46 AM

## 2017-05-06 ENCOUNTER — Inpatient Hospital Stay: Payer: Medicare PPO

## 2017-05-06 LAB — BASIC METABOLIC PANEL
Anion gap: 14 (ref 5–15)
BUN: 93 mg/dL — AB (ref 6–20)
CHLORIDE: 98 mmol/L — AB (ref 101–111)
CO2: 25 mmol/L (ref 22–32)
CREATININE: 1.74 mg/dL — AB (ref 0.61–1.24)
Calcium: 8.6 mg/dL — ABNORMAL LOW (ref 8.9–10.3)
GFR calc Af Amer: 39 mL/min — ABNORMAL LOW (ref >60)
GFR calc non Af Amer: 34 mL/min — ABNORMAL LOW (ref >60)
GLUCOSE: 146 mg/dL — AB (ref 65–99)
POTASSIUM: 3.4 mmol/L — AB (ref 3.5–5.1)
Sodium: 137 mmol/L (ref 135–145)

## 2017-05-06 LAB — CBC
HEMATOCRIT: 31.6 % — AB (ref 40.0–52.0)
Hemoglobin: 10.6 g/dL — ABNORMAL LOW (ref 13.0–18.0)
MCH: 29.9 pg (ref 26.0–34.0)
MCHC: 33.4 g/dL (ref 32.0–36.0)
MCV: 89.5 fL (ref 80.0–100.0)
Platelets: 373 10*3/uL (ref 150–440)
RBC: 3.53 MIL/uL — ABNORMAL LOW (ref 4.40–5.90)
RDW: 14.7 % — AB (ref 11.5–14.5)
WBC: 9.1 10*3/uL (ref 3.8–10.6)

## 2017-05-06 LAB — TROPONIN I: Troponin I: 6.27 ng/mL (ref <0.03)

## 2017-05-06 LAB — HEPARIN LEVEL (UNFRACTIONATED): Heparin Unfractionated: 0.53 IU/mL (ref 0.30–0.70)

## 2017-05-06 MED ORDER — HYDROCORTISONE ACETATE 25 MG RE SUPP
25.0000 mg | Freq: Two times a day (BID) | RECTAL | Status: DC | PRN
Start: 2017-05-06 — End: 2017-05-07
  Administered 2017-05-06 – 2017-05-07 (×2): 25 mg via RECTAL
  Filled 2017-05-06 (×3): qty 1

## 2017-05-06 MED ORDER — MILRINONE LACTATE IN DEXTROSE 20-5 MG/100ML-% IV SOLN
0.1250 ug/kg/min | INTRAVENOUS | Status: DC
  Administered 2017-05-06: 0.25 ug/kg/min via INTRAVENOUS
  Administered 2017-05-07: 0.125 ug/kg/min via INTRAVENOUS
  Filled 2017-05-06 (×2): qty 100

## 2017-05-06 NOTE — Progress Notes (Signed)
Progress Note  Patient Name: Dennis Burns Date of Encounter: 05/06/2017  Primary Cardiologist: Ida Rogue, MD   Subjective   "Weak"; denies CP or dyspnea  Inpatient Medications    Scheduled Meds: . aspirin EC  81 mg Oral Daily  . atorvastatin  80 mg Oral Daily  . calcium citrate  200 mg of elemental calcium Oral Daily  . carvedilol  6.25 mg Oral BID WC  . cholecalciferol  4,000 Units Oral Daily  . clopidogrel  75 mg Oral Daily  . docusate sodium  100 mg Oral BID  . ezetimibe  10 mg Oral Daily  . furosemide  60 mg Intravenous Q8H  . isosorbide mononitrate  60 mg Oral Daily  . latanoprost  1 drop Both Eyes QHS  . levothyroxine  75 mcg Oral QAC breakfast  . multivitamin with minerals  1 tablet Oral Daily  . omega-3 acid ethyl esters   Oral BID  . senna  1 tablet Oral Daily  . sodium chloride flush  3 mL Intravenous Q12H  . timolol  1 drop Both Eyes BID   Continuous Infusions: . heparin 1,250 Units/hr (05/06/17 0800)  . milrinone 0.25 mcg/kg/min (05/06/17 0800)   PRN Meds: acetaminophen, ALPRAZolam, ipratropium-albuterol, morphine injection, nitroGLYCERIN, nitroGLYCERIN, ondansetron (ZOFRAN) IV, phenol, traMADol   Vital Signs    Vitals:   05/06/17 0700 05/06/17 0800 05/06/17 0806 05/06/17 0900  BP: (!) 113/52 (!) 117/54  (!) 102/53  Pulse: 85 88  91  Resp: (!) 3 12  (!) 21  Temp:  97.9 F (36.6 C)    TempSrc:  Oral    SpO2: 93% (!) 89% 94% 95%  Weight:      Height:        Intake/Output Summary (Last 24 hours) at 05/06/2017 0935 Last data filed at 05/06/2017 0900 Gross per 24 hour  Intake 417.6 ml  Output 2975 ml  Net -2557.4 ml   Filed Weights   05/02/17 0525 05/03/17 0500 05/04/17 0500  Weight: 142 lb 8 oz (64.6 kg) 138 lb 0.1 oz (62.6 kg) 143 lb 8.3 oz (65.1 kg)    Telemetry    Sinus with PVCs- Personally Reviewed   Physical Exam   GEN: WD/WN No acute distress.   Neck: supple; JVP 10 cm Cardiac: RRR, 3/6 systolic murmur Respiratory:  Dimiinished BS bases; no wheeze GI: Soft, nontender, non-distended, no masses  MS: No edema Neuro: Grossly intact   Labs    Chemistry Recent Labs  Lab 05/04/17 0502 05/05/17 0508 05/06/17 0305  NA 138 138 137  K 4.0 3.8 3.4*  CL 103 100* 98*  CO2 20* 24 25  GLUCOSE 140* 148* 146*  BUN 100* 101* 93*  CREATININE 1.91* 1.97* 1.74*  CALCIUM 8.6* 8.4* 8.6*  GFRNONAA 30* 29* 34*  GFRAA 35* 34* 39*  ANIONGAP 15 14 14      Hematology Recent Labs  Lab 05/04/17 0502 05/05/17 0508 05/06/17 0305  WBC 10.8* 10.0 9.1  RBC 3.68* 3.47* 3.53*  HGB 11.2* 10.4* 10.6*  HCT 33.5* 31.3* 31.6*  MCV 91.0 90.1 89.5  MCH 30.5 29.9 29.9  MCHC 33.5 33.2 33.4  RDW 15.1* 14.6* 14.7*  PLT 333 338 373    Cardiac Enzymes Recent Labs  Lab 04/30/17 2108 05/01/17 0219 05/01/17 0810 05/06/17 0305  TROPONINI 12.08* 10.60* 6.92* 6.27*    BNP Recent Labs  Lab 04/30/17 1741 04/30/17 2108 05/05/17 0508  BNP 4,309.0* 4,115.0* 2,914.0*     Radiology    Dg Chest  Port 1 View  Result Date: 05/06/2017 CLINICAL DATA:  Respiratory failure. EXAM: PORTABLE CHEST 1 VIEW COMPARISON:  05/04/2017 FINDINGS: There is persistent bilateral interstitial thickening, although hazy airspace opacity has mostly resolved and pleural effusions have decreased in size. There are no new lung abnormalities. No pneumothorax. IMPRESSION: 1. Improved, but not resolved, congestive heart failure. Electronically Signed   By: Lajean Manes M.D.   On: 05/06/2017 07:33    Patient Profile     84 y.o. male w/ a h/o CAD s/p CABG in 2003, PAH, mod AS/AI, carotid dzs s/p R CEA, PAD, HTN, HL, DVT on chronic xarelto, CKD III, prostate CA, sciatica, and spinal stenosis, who was admitted from the office on 2/25 and found to have significant volume overload, AKI, and NSTEMI.    Assessment & Plan    1 non-ST elevation myocardial infarction-Unless significant improvement in renal insufficiency I do not think he would be a good  candidate for cardiac catheterization at this point.  Will plan medical therapy for now.  Continue aspirin, plavix, heparin, statin and beta-blocker. Pt also on nitrates.  2 acute on chronic combined systolic/diastolic congestive heart failure-Pt continues to improve clinically; I/O - 2607; renal function improved compared to yesterday.  Chest xray with improving CHF. Will continue present dose of Lasix and follow. Continue milrinone at present dose and begin wean in 48 hours. No ARB for now given renal insufficiency.  3 valvular heart disease-patient with moderate aortic stenosis, moderate aortic insufficiency and moderate to severe mitral regurgitation.  Likely contributing to congestive heart failure.   4 acute on chronic stage IV kidney disease-some improvement this AM likely related to addition of milrinone and improved perfusion; follow.  5 Hypertension-blood pressure is controlled.  Continue present medications.  6 no CODE BLUE  7 history of DVT-continue heparin. Will need to follow renal function closely prior to resuming xarelto.  For questions or updates, please contact [place] Please consult [email or web address] for contact info under Cardiology/STEMI.      Signed, Kirk Ruths, MD  05/06/2017, 9:35 AM

## 2017-05-06 NOTE — Progress Notes (Signed)
Alert and oriented x4. No c/o chestpain. Mild dyspnea with exertion. HFNC this am was exchanged for 02 at 6L/[place]. Pt currently with good sats on 3L/[place]. He sat up in chair for 1 1/2 hr for lunch. Moves well but a little unsteady on feet.VSS. NSR on monitor. Heparin and Milrinone gtts. Lasix 60mg  q 8 hr.  Good uop. Family at bedside all day. His major complaint is pain at anus. He has frequent incontinence of small smeary stool. Anusol suppository and tramadol relieve pain

## 2017-05-06 NOTE — Progress Notes (Signed)
[facility]  ROUNDING NOTE   Subjective:   UOP 3025 (2575)  Creatinine 1.74 (1.97) (1.91)  On Milrinone gtt  Objective:  Vital signs in last 24 hours:  Temp:  [98 F (36.7 C)] 98 F (36.7 C) (03/02 2000) Pulse Rate:  [77-99] 85 (03/03 0700) Resp:  [3-25] 3 (03/03 0700) BP: (82-121)/(40-71) 113/52 (03/03 0700) SpO2:  [86 %-98 %] 94 % (03/03 0806) FiO2 (%):  [40 %-50 %] 40 % (03/02 1946)  Weight change:  Filed Weights   05/02/17 0525 05/03/17 0500 05/04/17 0500  Weight: 64.6 kg (142 lb 8 oz) 62.6 kg (138 lb 0.1 oz) 65.1 kg (143 lb 8.3 oz)    Intake/Output: I/O last 3 completed shifts: In: 786.7 [I.V.:786.7] Out: 3557 [Urine:4625]   Intake/Output this shift:  No intake/output data recorded.  Physical Exam: General: NAD  Head: Normocephalic, atraumatic. Moist oral mucosal membranes  Eyes: Anicteric, PERRL  Neck: Supple, trachea midline  Lungs:  Bilateral crackles, 6L [place]  Heart: Regular rate and rhythm, +murmur  Abdomen:  Soft, nontender,   Extremities: no peripheral edema.  Neurologic: Nonfocal, moving all four extremities  Skin: No lesions        Basic Metabolic Panel: Recent Labs  Lab 05/02/17 0638 05/03/17 0402 05/04/17 0502 05/05/17 0508 05/06/17 0305  NA 136 136 138 138 137  K 4.3 5.0 4.0 3.8 3.4*  CL 106 104 103 100* 98*  CO2 18* 17* 20* 24 25  GLUCOSE 200* 196* 140* 148* 146*  BUN 105* 94* 100* 101* 93*  CREATININE 2.49* 2.05* 1.91* 1.97* 1.74*  CALCIUM 8.6* 8.6* 8.6* 8.4* 8.6*    Liver Function Tests: No results for input(s): AST, ALT, ALKPHOS, BILITOT, PROT, ALBUMIN in the last 168 hours. No results for input(s): LIPASE, AMYLASE in the last 168 hours. No results for input(s): AMMONIA in the last 168 hours.  CBC: Recent Labs  Lab 05/02/17 3220 05/03/17 0533 05/04/17 0502 05/05/17 0508 05/06/17 0305  WBC 6.1 14.2* 10.8* 10.0 9.1  HGB 10.7* 11.4* 11.2* 10.4* 10.6*  HCT 31.0* 34.8* 33.5* 31.3* 31.6*  MCV 90.0 91.1 91.0  90.1 89.5  PLT 278 359 333 338 373    Cardiac Enzymes: Recent Labs  Lab 04/30/17 1447 04/30/17 2108 05/01/17 0219 05/01/17 0810 05/06/17 0305  TROPONINI 9.24* 12.08* 10.60* 6.92* 6.27*    BNP: Invalid input(s): POCBNP  CBG: Recent Labs  Lab 05/03/17 0133  GLUCAP 74*    Microbiology: Results for orders placed or performed during the hospital encounter of 04/30/17  MRSA PCR Screening     Status: None   Collection Time: 05/03/17  1:32 AM  Result Value Ref Range Status   MRSA by PCR NEGATIVE NEGATIVE Final    Comment:        The GeneXpert MRSA Assay (FDA approved for NASAL specimens only), is one component of a comprehensive MRSA colonization surveillance program. It is not intended to diagnose MRSA infection nor to guide or monitor treatment for MRSA infections. Performed at [facility], [address] [place], [place] [address]     Coagulation Studies: No results for input(s): LABPROT, INR in the last 72 hours.  Urinalysis: No results for input(s): COLORURINE, LABSPEC, PHURINE, GLUCOSEU, HGBUR, BILIRUBINUR, KETONESUR, PROTEINUR, UROBILINOGEN, NITRITE, LEUKOCYTESUR in the last 72 hours.  Invalid input(s): APPERANCEUR    Imaging: Dg Chest Port 1 View  Result Date: 05/06/2017 CLINICAL DATA:  Respiratory failure. EXAM: PORTABLE CHEST 1 VIEW COMPARISON:  05/04/2017 FINDINGS: There is persistent bilateral interstitial  thickening, although hazy airspace opacity has mostly resolved and pleural effusions have decreased in size. There are no new lung abnormalities. No pneumothorax. IMPRESSION: 1. Improved, but not resolved, congestive heart failure. Electronically Signed   By: Lajean Manes M.D.   On: 05/06/2017 07:33     Medications:   . heparin 1,250 Units/hr (05/06/17 0700)  . milrinone 0.25 mcg/kg/min (05/06/17 0700)   . aspirin EC  81 mg Oral Daily  . atorvastatin  80 mg Oral Daily  . calcium citrate  200 mg of elemental calcium Oral Daily  .  carvedilol  6.25 mg Oral BID WC  . cholecalciferol  4,000 Units Oral Daily  . clopidogrel  75 mg Oral Daily  . docusate sodium  100 mg Oral BID  . ezetimibe  10 mg Oral Daily  . furosemide  60 mg Intravenous Q8H  . isosorbide mononitrate  60 mg Oral Daily  . latanoprost  1 drop Both Eyes QHS  . levothyroxine  75 mcg Oral QAC breakfast  . multivitamin with minerals  1 tablet Oral Daily  . omega-3 acid ethyl esters   Oral BID  . senna  1 tablet Oral Daily  . sodium chloride flush  3 mL Intravenous Q12H  . timolol  1 drop Both Eyes BID   acetaminophen, ALPRAZolam, ipratropium-albuterol, morphine injection, nitroGLYCERIN, nitroGLYCERIN, ondansetron (ZOFRAN) IV, phenol, traMADol  Assessment/ Plan:  Mr. Arieon Corcoran is a 84 y.o. white male with hypertension, coronary artery disease, history of DVT, glaucoma, history of prostate cancer, hyperlipidemia, hypothyroidism, aortic stenosis, diastolic congestive heart failure who is admitted for acute coronary syndrome.   Hospital Course: Admitted on 04/30/2017 for acute exacerbation of congestive heart failure and acute coronary syndrome. Placed on heparin gtt. Cardiology recommends medical mangement. Moved to ICU on 2/28 and placed on BIPAP. Milrinone started on 3/1 by Cardiology. Transitioned to HFNC. Sunday on 8L [place] O2.   1. Acute renal failure on chronic kidney disease stage III: Baseline creatinine 1.48, GFR of 12/2016. Bland urine. Acute renal failure from acute coronary syndrome and acute cardiorenal syndrome.  Renal ultrasound consistent with left sided renal artery stenosis.  Nonoliguric urine output. Creatinine improving.  Monitor urine output.  - Recommend renally dosing all medications. Appreciate cardiology input.  - holding valsartan  2. Hypertension: with acute diastolic/systolic congestive heart failure/aortic stenosis and coronary syndrome.  Acute respiratory failure with hypoxia. Appreciate critical care input - Continue  furosemide 60mg  IV q8 - Continue cavedilol, imdur, nifedipine - Appreciate cardiology input.  - Continue Milrinone gtt   3. Anemia of chronic kidney disease: hemoglobin 10.6. No indication for ESA    LOS: 6 Gavan Nordby 3/3/20198:38 AM

## 2017-05-06 NOTE — Progress Notes (Signed)
[facility] NAME: Dennis Burns    MR#:  [id number]  DATE OF BIRTH:  06/05/1928  SUBJECTIVE:   Patient doing well this am Family at bedside   REVIEW OF SYSTEMS:    Review of Systems  Constitutional: Negative for fever, chills weight loss HENT: Negative for ear pain, nosebleeds, congestion, facial swelling, rhinorrhea, neck pain, neck stiffness and ear discharge.   Respiratory: Negative for cough, denies SOB Cardiovascular: Negative for chest pain, palpitations and leg swelling.  Gastrointestinal: Negative for heartburn, abdominal pain, vomiting, diarrhea or consitpation Genitourinary: Negative for dysuria, urgency, frequency, hematuria Musculoskeletal: Negative for back pain or joint pain Neurological: Negative for dizziness, seizures, syncope, focal weakness,  numbness and headaches.  Hematological: Does not bruise/bleed easily.  Psychiatric/Behavioral: Negative for hallucinations, confusion, dysphoric mood    Tolerating Diet: yes      DRUG ALLERGIES:  No Known Allergies  VITALS:  Blood pressure (!) 102/53, pulse 91, temperature 97.9 F (36.6 C), temperature source Oral, resp. rate (!) 21, height 5\' 3"  (1.6 m), weight 65.1 kg (143 lb 8.3 oz), SpO2 95 %.  PHYSICAL EXAMINATION:  Constitutional: Appears well-developed and well-nourished. No distress. HENT: Normocephalic. Patient on HFNCEyes: Conjunctivae and EOM are normal. PERRLA, no scleral icterus.  Neck: Normal ROM. Neck supple. No tracheal deviation. CVS: Tachycardia S1/S2 +, 2/6 SEM,  no gallops, no carotid bruit.  Pulmonary: Effort and breath sounds normal, no stridor, rhonchi, wheezes, rales.  Abdominal: Soft. BS +,  no distension, tenderness, rebound or guarding.  Musculoskeletal: Normal range of motion. No edema and no tenderness.  Neuro: Alert. CN 2-12 grossly intact. No focal deficits. Skin: Skin is warm and dry. No rash noted. Psychiatric: Normal mood  and affect.      LABORATORY PANEL:   CBC Recent Labs  Lab 05/06/17 0305  WBC 9.1  HGB 10.6*  HCT 31.6*  PLT 373   ------------------------------------------------------------------------------------------------------------------  Chemistries  Recent Labs  Lab 05/06/17 0305  NA 137  K 3.4*  CL 98*  CO2 25  GLUCOSE 146*  BUN 93*  CREATININE 1.74*  CALCIUM 8.6*   ------------------------------------------------------------------------------------------------------------------  Cardiac Enzymes Recent Labs  Lab 05/01/17 0219 05/01/17 0810 05/06/17 0305  TROPONINI 10.60* 6.92* 6.27*   ------------------------------------------------------------------------------------------------------------------  RADIOLOGY:  Dg Chest Port 1 View  Result Date: 05/06/2017 CLINICAL DATA:  Respiratory failure. EXAM: PORTABLE CHEST 1 VIEW COMPARISON:  05/04/2017 FINDINGS: There is persistent bilateral interstitial thickening, although hazy airspace opacity has mostly resolved and pleural effusions have decreased in size. There are no new lung abnormalities. No pneumothorax. IMPRESSION: 1. Improved, but not resolved, congestive heart failure. Electronically Signed   By: Lajean Manes M.D.   On: 05/06/2017 07:33     ASSESSMENT AND PLAN:  84 year old male with history of CAD status post CABG, PAD, hypertension , chronic kidney disease stage III and DVT on anticoagulation who presented due to shortness of breath.  1. Non-ST elevation MI: Troponin max is 12.08 Due to acute on chronic kidney disease he is currently not a candidate for cardiac catheterization Continue aspirin, Plavix statin, Coreg, isosorbide  Cardiology consultation appreciated Echocardiogram shows ejection fraction 35-40% with probable hypokinesis of the apical anterior,lateral, and apical myocardium. Probable hypokinesis of the basal-midinferolateral myocardium At this point medical management is advised.  2. Acute  hypoxic respiratory failure due to Acute on chronic combined systolic and diastolic heart failure with ejection fraction 35-40%  heart failure and pulmonary hypertension:  Continue to wean  to nasal cannula as tolerated  Continue IV Lasix and MILRINONE for inotropic support. Plan to wean Milrinone in 48 hours Monitor intake and output daily weight Echo shows elevated right heart pressure. Chest x-ray shows improving CHF.  3. Hyperlipidemia: Continues Zetia and Lipitor  4. Hypothyroidism: Continue Synthroid  5. Essential hypertension: Continue Coreg  and isosorbide 6. Acute on chronic kidney disease stage III: Nephrology consultation appreciated acute kidney injury in the setting of cardiorenal syndrome  Creatinine improving with diuresis/milrinone  Renal ultrasound reviewed, no obstruction seen.   7. History of DVT: Currently on heparin xarelto on hold due to this Dopplers were negative for acute DVT    Management plans discussed with the patient and he is in agreement  CODE STATUS: full  TOTAL TIME TAKING CARE OF THIS PATIENT: 22 minutes.  D/w dr Stanford Breed  POSSIBLE D/C 3-4 days, DEPENDING ON CLINICAL CONDITION.   [name] M.D on 05/06/2017 at 10:53 AM  Between 7am to 6pm - Pager - [phone] After 6pm go to [email or web address] - password [email or web address] [facility]  Office  [phone]  CC: Primary care physician; [name], MD  Note: This dictation was prepared with Dragon dictation along with smaller phrase technology. Any transcriptional errors that result from this process are unintentional.

## 2017-05-06 NOTE — Progress Notes (Signed)
[place] for heparin drip Indication: chest pain/ACS  No Known Allergies  Patient Measurements: Height: 5\' 3"  (160 cm) Weight: 143 lb 8.3 oz (65.1 kg) IBW/kg (Calculated) : 56.9 Heparin Dosing Weight: 67 kg  Vital Signs: BP: 120/68 (03/03 0300) Pulse Rate: 84 (03/03 0300)  Labs: Recent Labs    05/04/17 0502  05/05/17 0508 05/05/17 0820 05/05/17 1549 05/06/17 0305  HGB 11.2*  --  10.4*  --   --  10.6*  HCT 33.5*  --  31.3*  --   --  31.6*  PLT 333  --  338  --   --  373  HEPARINUNFRC 0.22*   < >  --  0.50 0.41 0.53  CREATININE 1.91*  --  1.97*  --   --  1.74*  TROPONINI  --   --   --   --   --  6.27*   < > = values in this interval not displayed.    Estimated Creatinine Clearance: 24.5 mL/min (A) (by C-G formula based on SCr of 1.74 mg/dL (H)).   Medical History: Past Medical History:  Diagnosis Date  . Allergic rhinitis due to pollen    as child--better now  . Chronic kidney disease, stage III (moderate) (HCC)   . Coronary artery disease   . DVT, lower extremity, recurrent ([place])   . Glaucoma    [facility]   . History of prostate cancer 2004  . Hyperlipidemia   . Hypertension   . Hypothyroidism   . Impaired fasting glucose   . Spinal stenosis of lumbar region with radiculopathy     Assessment: Pharmacy consulted to dose and monitor heparin drip in this 84 year old man for ACS. Patient on rivaroxaban as an outpatient and initially monitored via APTTs. Patient now being managed with anti-Xa levels and currently receiving heparin at 1150 units/hr   Goal of Therapy:  APTT 66-102 seconds Heparin level 0.3-0.7 units/ml Monitor platelets by anticoagulation protocol: Yes   Plan:  Bolus heparin 1000 units and increase rate to 1250 units/hr and obtain anti-Xa level 0800.   3/2 08:20 HL = 0.50.  Continue current drip rate.  Recheck HL today at 16:00. 3/2 15:49 HL = 0.41.  Continue current drip rate.  Recheck HL tomorrow  at 0500.  03/03 @ 0300 HL 0.53 therapeutic. Will continue current rate and recheck tomorrow w/ am labs. CBC stable.  Pharmacy will continue to monitor and adjust per consult.   Tobie Lords, PharmD, BCPS Clinical Pharmacist 05/06/2017

## 2017-05-06 NOTE — Progress Notes (Signed)
CC follow  Up resp distress  SUBJECTIVE He remains in no distress on HFNC.  We are decreasing FiO2.  Presently 40% He has not required BiPAP since yesterday He continues to deny chest pain His cognition remains intact No new complaints  Vitals:   05/06/17 0400 05/06/17 0500 05/06/17 0600 05/06/17 0700  BP: 121/62 (!) 109/54 115/71 (!) 113/52  Pulse: 87 86 86 85  Resp: (!) 9 17 13  (!) 3  Temp:      TempSrc:      SpO2: 93% 94% 93% 93%  Weight:      Height:        Gen: NAD HEENT: NCAT, sclerae white, oropharynx normal Neck: No LAN, no JVD noted Lungs: Bilateral crackles, no wheezes Cardiovascular: Reg, systolic M unchanged Abdomen: Soft, NT, +BS Ext: Warm, no edema  Neuro: Remains grossly intact   BMP Latest Ref Rng & Units 05/06/2017 05/05/2017 05/04/2017  Glucose 65 - 99 mg/dL 146(H) 148(H) 140(H)  BUN 6 - 20 mg/dL 93(H) 101(H) 100(H)  Creatinine 0.61 - 1.24 mg/dL 1.74(H) 1.97(H) 1.91(H)  BUN/Creat Ratio 10 - 24 - - -  Sodium 135 - 145 mmol/L 137 138 138  Potassium 3.5 - 5.1 mmol/L 3.4(L) 3.8 4.0  Chloride 101 - 111 mmol/L 98(L) 100(L) 103  CO2 22 - 32 mmol/L 25 24 20(L)  Calcium 8.9 - 10.3 mg/dL 8.6(L) 8.4(L) 8.6(L)    CBC Latest Ref Rng & Units 05/06/2017 05/05/2017 05/04/2017  WBC 3.8 - 10.6 K/uL 9.1 10.0 10.8(H)  Hemoglobin 13.0 - 18.0 g/dL 10.6(L) 10.4(L) 11.2(L)  Hematocrit 40.0 - 52.0 % 31.6(L) 31.3(L) 33.5(L)  Platelets 150 - 440 K/uL 373 338 333    CXR: No new film  IMPRESSION: 84 yo white male with progressive cardiorenal syndrome with hypoxic resp failure on milrinone infusion and heparin infusion  Recent NSTEMI  Ischemic cardiomyopathy Aortic stenosis Mitral regurgitation History of pulmonary hypertension Acute pulmonary edema History of DVT AKI/CKD -creatinine relatively stable at 1.97, not far from baseline and improved since admission DNR/DNI  PLAN/REC:  Continue supplemental oxygen to maintain SPO2 >90%.  Wean off HFNC as able Continue diuresis as  permitted by BP and renal function Milrinone infusion per cardiology Remains on heparin infusion, holding rivaroxaban  He needs to remain in the stepdown unit as long as he is on HFNC and milrinone infusion   Corrin Parker, M.D.  [facility]  Medical Director [place] Director [facility] Cardio-Pulmonary Department

## 2017-05-07 LAB — HEPARIN LEVEL (UNFRACTIONATED): HEPARIN UNFRACTIONATED: 0.65 [IU]/mL (ref 0.30–0.70)

## 2017-05-07 LAB — RENAL FUNCTION PANEL
Albumin: 2.5 g/dL — ABNORMAL LOW (ref 3.5–5.0)
Anion gap: 13 (ref 5–15)
BUN: 83 mg/dL — AB (ref 6–20)
CHLORIDE: 95 mmol/L — AB (ref 101–111)
CO2: 28 mmol/L (ref 22–32)
Calcium: 8.8 mg/dL — ABNORMAL LOW (ref 8.9–10.3)
Creatinine, Ser: 1.79 mg/dL — ABNORMAL HIGH (ref 0.61–1.24)
GFR calc Af Amer: 38 mL/min — ABNORMAL LOW (ref >60)
GFR calc non Af Amer: 33 mL/min — ABNORMAL LOW (ref >60)
Glucose, Bld: 120 mg/dL — ABNORMAL HIGH (ref 65–99)
POTASSIUM: 3.5 mmol/L (ref 3.5–5.1)
Phosphorus: 3.5 mg/dL (ref 2.5–4.6)
Sodium: 136 mmol/L (ref 135–145)

## 2017-05-07 LAB — CBC WITH DIFFERENTIAL/PLATELET
BASOS ABS: 0.1 10*3/uL (ref 0–0.1)
Basophils Relative: 1 %
EOS PCT: 2 %
Eosinophils Absolute: 0.2 10*3/uL (ref 0–0.7)
HEMATOCRIT: 33.7 % — AB (ref 40.0–52.0)
Hemoglobin: 10.9 g/dL — ABNORMAL LOW (ref 13.0–18.0)
LYMPHS ABS: 0.8 10*3/uL — AB (ref 1.0–3.6)
LYMPHS PCT: 8 %
MCH: 29.3 pg (ref 26.0–34.0)
MCHC: 32.5 g/dL (ref 32.0–36.0)
MCV: 90.3 fL (ref 80.0–100.0)
Monocytes Absolute: 1 10*3/uL (ref 0.2–1.0)
Monocytes Relative: 9 %
NEUTROS ABS: 8.2 10*3/uL — AB (ref 1.4–6.5)
Neutrophils Relative %: 80 %
PLATELETS: 406 10*3/uL (ref 150–440)
RBC: 3.73 MIL/uL — AB (ref 4.40–5.90)
RDW: 14.6 % — ABNORMAL HIGH (ref 11.5–14.5)
WBC: 10.2 10*3/uL (ref 3.8–10.6)

## 2017-05-07 LAB — BASIC METABOLIC PANEL
ANION GAP: 13 (ref 5–15)
BUN: 83 mg/dL — AB (ref 6–20)
CO2: 25 mmol/L (ref 22–32)
Calcium: 8.7 mg/dL — ABNORMAL LOW (ref 8.9–10.3)
Chloride: 95 mmol/L — ABNORMAL LOW (ref 101–111)
Creatinine, Ser: 1.63 mg/dL — ABNORMAL HIGH (ref 0.61–1.24)
GFR calc Af Amer: 42 mL/min — ABNORMAL LOW (ref >60)
GFR, EST NON AFRICAN AMERICAN: 37 mL/min — AB (ref >60)
GLUCOSE: 205 mg/dL — AB (ref 65–99)
Potassium: 3.6 mmol/L (ref 3.5–5.1)
Sodium: 133 mmol/L — ABNORMAL LOW (ref 135–145)

## 2017-05-07 LAB — MAGNESIUM: Magnesium: 2.1 mg/dL (ref 1.7–2.4)

## 2017-05-07 LAB — BRAIN NATRIURETIC PEPTIDE: B Natriuretic Peptide: 3671 pg/mL — ABNORMAL HIGH (ref 0.0–100.0)

## 2017-05-07 MED ORDER — FUROSEMIDE 10 MG/ML IJ SOLN
60.0000 mg | Freq: Two times a day (BID) | INTRAMUSCULAR | Status: DC
  Administered 2017-05-07 – 2017-05-08 (×2): 60 mg via INTRAVENOUS
  Filled 2017-05-07 (×3): qty 6

## 2017-05-07 MED ORDER — APIXABAN 2.5 MG PO TABS
2.5000 mg | ORAL_TABLET | Freq: Two times a day (BID) | ORAL | Status: DC
  Administered 2017-05-07 – 2017-05-10 (×7): 2.5 mg via ORAL
  Filled 2017-05-07 (×7): qty 1

## 2017-05-07 MED ORDER — PRAMOXINE HCL 1 % RE FOAM
Freq: Three times a day (TID) | RECTAL | Status: DC | PRN
  Filled 2017-05-07: qty 15

## 2017-05-07 MED ORDER — HYDROCORTISONE ACE-PRAMOXINE 1-1 % RE FOAM
1.0000 | Freq: Two times a day (BID) | RECTAL | Status: DC
  Administered 2017-05-07 – 2017-05-10 (×7): 1 via RECTAL
  Filled 2017-05-07 (×2): qty 10

## 2017-05-07 NOTE — Progress Notes (Signed)
   05/07/17 1350  Clinical Encounter Type  Visited With Patient and family together  Visit Type Follow-up   Chaplain offered visit; visitors present appreciative, but patient ready to rest.

## 2017-05-07 NOTE — Progress Notes (Signed)
Progress Note  Patient Name: Dennis Burns Date of Encounter: 05/07/2017  Primary Cardiologist: Ida Rogue, MD   Subjective   He denies chest pain.  He reports improvement in shortness of breath.  Oxygen requirement has improved.   Inpatient Medications    Scheduled Meds: . aspirin EC  81 mg Oral Daily  . atorvastatin  80 mg Oral Daily  . calcium citrate  200 mg of elemental calcium Oral Daily  . carvedilol  6.25 mg Oral BID WC  . cholecalciferol  4,000 Units Oral Daily  . clopidogrel  75 mg Oral Daily  . docusate sodium  100 mg Oral BID  . ezetimibe  10 mg Oral Daily  . furosemide  60 mg Intravenous Q8H  . isosorbide mononitrate  60 mg Oral Daily  . latanoprost  1 drop Both Eyes QHS  . levothyroxine  75 mcg Oral QAC breakfast  . multivitamin with minerals  1 tablet Oral Daily  . omega-3 acid ethyl esters   Oral BID  . senna  1 tablet Oral Daily  . sodium chloride flush  3 mL Intravenous Q12H  . timolol  1 drop Both Eyes BID   Continuous Infusions: . heparin 1,250 Units/hr (05/06/17 1925)  . milrinone 0.25 mcg/kg/min (05/06/17 2321)   PRN Meds: acetaminophen, ALPRAZolam, hydrocortisone, ipratropium-albuterol, morphine injection, nitroGLYCERIN, nitroGLYCERIN, ondansetron (ZOFRAN) IV, phenol, traMADol   Vital Signs    Vitals:   05/07/17 0400 05/07/17 0500 05/07/17 0505 05/07/17 0600  BP: (!) 122/59 (!) 118/55  (!) 117/58  Pulse: 85 84  85  Resp: 15     Temp:      TempSrc:      SpO2: (!) 89% 93%  91%  Weight:   130 lb 4.7 oz (59.1 kg)   Height:        Intake/Output Summary (Last 24 hours) at 05/07/2017 0828 Last data filed at 05/07/2017 0634 Gross per 24 hour  Intake 864.48 ml  Output 2910 ml  Net -2045.52 ml   Filed Weights   05/04/17 0500 05/06/17 2047 05/07/17 0505  Weight: 143 lb 8.3 oz (65.1 kg) 137 lb 5.6 oz (62.3 kg) 130 lb 4.7 oz (59.1 kg)    Telemetry    Sinus rhythm with intermittent sinus tachycardia and PVCs- Personally  Reviewed   Physical Exam   GEN: WD/WN No acute distress.   Neck: supple; mild JVD Cardiac: RRR with premature beats.  There is a 3 out of 6 crescendo decrescendo systolic murmur in the aortic area Respiratory: Dimiinished BS bases; no wheeze GI: Soft, nontender, non-distended, no masses  MS: No edema Neuro: Grossly intact   Labs    Chemistry Recent Labs  Lab 05/05/17 0508 05/06/17 0305 05/07/17 0504  NA 138 137 136  K 3.8 3.4* 3.5  CL 100* 98* 95*  CO2 24 25 28   GLUCOSE 148* 146* 120*  BUN 101* 93* 83*  CREATININE 1.97* 1.74* 1.79*  CALCIUM 8.4* 8.6* 8.8*  ALBUMIN  --   --  2.5*  GFRNONAA 29* 34* 33*  GFRAA 34* 39* 38*  ANIONGAP 14 14 13      Hematology Recent Labs  Lab 05/04/17 0502 05/05/17 0508 05/06/17 0305  WBC 10.8* 10.0 9.1  RBC 3.68* 3.47* 3.53*  HGB 11.2* 10.4* 10.6*  HCT 33.5* 31.3* 31.6*  MCV 91.0 90.1 89.5  MCH 30.5 29.9 29.9  MCHC 33.5 33.2 33.4  RDW 15.1* 14.6* 14.7*  PLT 333 338 373    Cardiac Enzymes Recent Labs  Lab  04/30/17 2108 05/01/17 0219 05/01/17 0810 05/06/17 0305  TROPONINI 12.08* 10.60* 6.92* 6.27*    BNP Recent Labs  Lab 04/30/17 2108 05/05/17 0508 05/07/17 0504  BNP 4,115.0* 2,914.0* 3,671.0*     Radiology    Dg Chest Port 1 View  Result Date: 05/06/2017 CLINICAL DATA:  Respiratory failure. EXAM: PORTABLE CHEST 1 VIEW COMPARISON:  05/04/2017 FINDINGS: There is persistent bilateral interstitial thickening, although hazy airspace opacity has mostly resolved and pleural effusions have decreased in size. There are no new lung abnormalities. No pneumothorax. IMPRESSION: 1. Improved, but not resolved, congestive heart failure. Electronically Signed   By: Lajean Manes M.D.   On: 05/06/2017 07:33    Patient Profile     84 y.o. male w/ a h/o CAD s/p CABG in 2003, PAH, mod AS/AI, carotid dzs s/p R CEA, PAD, HTN, HL, DVT on chronic xarelto, CKD III, prostate CA, sciatica, and spinal stenosis, who was admitted from the  office on 2/25 and found to have significant volume overload, AKI, and NSTEMI.    Assessment & Plan    1 non-ST elevation myocardial infarction: Continue medical therapy.  Given his age and recent acute on chronic renal failure, probably best to treat him medically initially.  An outpatient cardiac catheterization can be considered if the patient makes significant improvement in renal function returns to baseline.   Given that the patient is on anticoagulation for DVT, I stopped Plavix and continued aspirin.  2 acute on chronic combined systolic/diastolic congestive heart failure-Pt continues to improve clinically; he is -10 L for the admission and his weight is down at least 12 pounds.  He appears to be close to being euvolemic. I decreased milrinone to 0.125 mcg/kg/min with plans to stop this tomorrow and possibly transfer to telemetry after that. Continue intravenous diuresis but I decrease the frequency to twice daily instead of 3 times daily. Continue treatment with carvedilol and Imdur.  We can consider adding hydralazine if blood pressure allows.   3 valvular heart disease-patient with moderate aortic stenosis, moderate aortic insufficiency and moderate to severe mitral regurgitation.  Likely contributing to congestive heart failure.   4 acute on chronic stage IV kidney disease-some improvement : Gradual improvement with milrinone and diuresis.  5 Hypertension-blood pressure is controlled.  Continue present medications.  6 the patient is DNR.  7 history of DVT: I am stopping heparin today and switching to Eliquis 2.5 mg twice daily.  I discussed the plan with the patient's family, Dr.Kasa and nursing.  For questions or updates, please contact [place] Please consult [email or web address] for contact info under Cardiology/STEMI.      Signed, [name], MD  05/07/2017, 8:28 AM

## 2017-05-07 NOTE — Progress Notes (Signed)
[facility]  ROUNDING NOTE   Subjective:  Overall shortness of breath has improved. Lasix frequency decreased from 3 times daily to twice daily. Milrinone dosage was also decreased.   Objective:  Vital signs in last 24 hours:  Temp:  [97.6 F (36.4 C)-98.4 F (36.9 C)] 98.4 F (36.9 C) (03/04 0100) Pulse Rate:  [79-90] 85 (03/04 0600) Resp:  [6-30] 15 (03/04 0400) BP: (98-126)/(50-73) 117/58 (03/04 0600) SpO2:  [87 %-95 %] 91 % (03/04 0600) FiO2 (%):  [32 %-44 %] 32 % (03/03 1930) Weight:  [59.1 kg (130 lb 4.7 oz)-62.3 kg (137 lb 5.6 oz)] 59.1 kg (130 lb 4.7 oz) (03/04 0505)  Weight change:  Filed Weights   05/04/17 0500 05/06/17 2047 05/07/17 0505  Weight: 65.1 kg (143 lb 8.3 oz) 62.3 kg (137 lb 5.6 oz) 59.1 kg (130 lb 4.7 oz)    Intake/Output: I/O last 3 completed shifts: In: 1348.1 [P.O.:720; I.V.:628.1] Out: 7654 [Urine:4935]   Intake/Output this shift:  No intake/output data recorded.  Physical Exam: General: NAD  Head: Normocephalic, atraumatic. Moist oral mucosal membranes  Eyes: Anicteric  Neck: Supple, trachea midline  Lungs:  Bilateral crackles, normal effort  Heart: irregular, +murmur  Abdomen:  Soft, nontender, BS  Extremities: no peripheral edema.  Neurologic: Nonfocal, moving all four extremities  Skin: No lesions        Basic Metabolic Panel: Recent Labs  Lab 05/03/17 0402 05/04/17 0502 05/05/17 0508 05/06/17 0305 05/07/17 0504  NA 136 138 138 137 136  K 5.0 4.0 3.8 3.4* 3.5  CL 104 103 100* 98* 95*  CO2 17* 20* 24 25 28   GLUCOSE 196* 140* 148* 146* 120*  BUN 94* 100* 101* 93* 83*  CREATININE 2.05* 1.91* 1.97* 1.74* 1.79*  CALCIUM 8.6* 8.6* 8.4* 8.6* 8.8*  MG  --   --   --   --  2.1  PHOS  --   --   --   --  3.5    Liver Function Tests: Recent Labs  Lab 05/07/17 0504  ALBUMIN 2.5*   No results for input(s): LIPASE, AMYLASE in the last 168 hours. No results for input(s): AMMONIA in the last 168  hours.  CBC: Recent Labs  Lab 05/02/17 6503 05/03/17 0533 05/04/17 0502 05/05/17 0508 05/06/17 0305  WBC 6.1 14.2* 10.8* 10.0 9.1  HGB 10.7* 11.4* 11.2* 10.4* 10.6*  HCT 31.0* 34.8* 33.5* 31.3* 31.6*  MCV 90.0 91.1 91.0 90.1 89.5  PLT 278 359 333 338 373    Cardiac Enzymes: Recent Labs  Lab 04/30/17 1447 04/30/17 2108 05/01/17 0219 05/01/17 0810 05/06/17 0305  TROPONINI 9.24* 12.08* 10.60* 6.92* 6.27*    BNP: Invalid input(s): POCBNP  CBG: Recent Labs  Lab 05/03/17 0133  GLUCAP 28*    Microbiology: Results for orders placed or performed during the hospital encounter of 04/30/17  MRSA PCR Screening     Status: None   Collection Time: 05/03/17  1:32 AM  Result Value Ref Range Status   MRSA by PCR NEGATIVE NEGATIVE Final    Comment:        The GeneXpert MRSA Assay (FDA approved for NASAL specimens only), is one component of a comprehensive MRSA colonization surveillance program. It is not intended to diagnose MRSA infection nor to guide or monitor treatment for MRSA infections. Performed at [facility], [address] [place], [place] [address]     Coagulation Studies: No results for input(s): LABPROT, INR in the last 72 hours.  Urinalysis:  No results for input(s): COLORURINE, LABSPEC, [place], GLUCOSEU, HGBUR, BILIRUBINUR, KETONESUR, PROTEINUR, UROBILINOGEN, NITRITE, LEUKOCYTESUR in the last 72 hours.  Invalid input(s): APPERANCEUR    Imaging: Dg Chest Port 1 View  Result Date: 05/06/2017 CLINICAL DATA:  Respiratory failure. EXAM: PORTABLE CHEST 1 VIEW COMPARISON:  05/04/2017 FINDINGS: There is persistent bilateral interstitial thickening, although hazy airspace opacity has mostly resolved and pleural effusions have decreased in size. There are no new lung abnormalities. No pneumothorax. IMPRESSION: 1. Improved, but not resolved, congestive heart failure. Electronically Signed   By: Lajean Manes M.D.   On: 05/06/2017 07:33      Medications:   . milrinone 0.25 mcg/kg/min (05/06/17 2321)   . apixaban  2.5 mg Oral BID  . aspirin EC  81 mg Oral Daily  . atorvastatin  80 mg Oral Daily  . calcium citrate  200 mg of elemental calcium Oral Daily  . carvedilol  6.25 mg Oral BID WC  . cholecalciferol  4,000 Units Oral Daily  . docusate sodium  100 mg Oral BID  . ezetimibe  10 mg Oral Daily  . furosemide  60 mg Intravenous BID  . isosorbide mononitrate  60 mg Oral Daily  . latanoprost  1 drop Both Eyes QHS  . levothyroxine  75 mcg Oral QAC breakfast  . multivitamin with minerals  1 tablet Oral Daily  . omega-3 acid ethyl esters   Oral BID  . senna  1 tablet Oral Daily  . sodium chloride flush  3 mL Intravenous Q12H  . timolol  1 drop Both Eyes BID   acetaminophen, ALPRAZolam, hydrocortisone, ipratropium-albuterol, morphine injection, nitroGLYCERIN, nitroGLYCERIN, ondansetron (ZOFRAN) IV, phenol, traMADol  Assessment/ Plan:  [name] is a 84 y.o. white male with hypertension, coronary artery disease, history of DVT, glaucoma, history of prostate cancer, hyperlipidemia, hypothyroidism, aortic stenosis, diastolic congestive heart failure who is admitted for acute coronary syndrome.   Hospital Course: Admitted on 04/30/2017 for acute exacerbation of congestive heart failure and acute coronary syndrome. Placed on heparin gtt. Cardiology recommends medical mangement. Moved to ICU on 2/28 and placed on BIPAP. Milrinone started on 3/1 by Cardiology. Transitioned to HFNC. Sunday on 8L [place] O2.   1. Acute renal failure on chronic kidney disease stage III: Baseline creatinine 1.48, GFR of 12/2016. Bland urine. Acute renal failure from acute coronary syndrome and acute cardiorenal syndrome.  Renal ultrasound consistent with left sided renal artery stenosis.  -Creatinine appears to be stable however BUN remains high likely secondary to use of diuretics.  However his heart failure appears to be improving clinically  therefore we will maintain current dosage of Lasix.  2. Hypertension: with acute diastolic/systolic congestive heart failure/aortic stenosis and coronary syndrome.  Acute respiratory failure with hypoxia. Appreciate critical care input -Lasix decreased to 60 mg IV every 12 hours.  Milrinone dosage also decreased.  3. Anemia of chronic kidney disease: Most recent hemoglobin was 10.6.  No indication for Epogen at this time.    LOS: 7 Dennis Burns 3/4/20199:22 AM

## 2017-05-07 NOTE — Consult Note (Signed)
[place] Nurse wound consult note Reason for Consult: Buttock blisters Wound type: Unknown etiology  Patchy papules on patients buttocks suggestive of herpes simplex lesions.  Patient denies pain or itching. I recommend a viral biopsy of the lesions.  This exceeds the scope of the [facility] RN.  Consider a dermatology consult.  Discussed POC with patient and bedside nurse.  Re consult if needed, will not follow at this time. Thank you,  Val Riles MSN,RN,CWOCN,CNS-BC, [phone])

## 2017-05-07 NOTE — Progress Notes (Signed)
[address] at [place] NAME: Dennis Burns    MR#:  [id number]  DATE OF BIRTH:  [birth year]  SUBJECTIVE:   Patient without SOB this am   REVIEW OF SYSTEMS:    Review of Systems  Constitutional: Negative for fever, chills weight loss HENT: Negative for ear pain, nosebleeds, congestion, facial swelling, rhinorrhea, neck pain, neck stiffness and ear discharge.   Respiratory: Negative for cough, denies SOB Cardiovascular: Negative for chest pain, palpitations and leg swelling.  Gastrointestinal: Negative for heartburn, abdominal pain, vomiting, diarrhea or consitpation Genitourinary: Negative for dysuria, urgency, frequency, hematuria Musculoskeletal: Negative for back pain or joint pain Neurological: Negative for dizziness, seizures, syncope, focal weakness,  numbness and headaches.  Hematological: Does not bruise/bleed easily.  Psychiatric/Behavioral: Negative for hallucinations, confusion, dysphoric mood    Tolerating Diet: yes      DRUG ALLERGIES:  No Known Allergies  VITALS:  Blood pressure (!) 117/53, pulse 93, temperature 98.2 F (36.8 C), temperature source Oral, resp. rate 13, height 5\' 3"  (1.6 m), weight 59.1 kg (130 lb 4.7 oz), SpO2 92 %.  PHYSICAL EXAMINATION:  Constitutional: Appears well-developed and well-nourished. No distress. HENT: Normocephalic. Patient on HFNCEyes: Conjunctivae and EOM are normal. PERRLA, no scleral icterus.  Neck: Normal ROM. Neck supple. No tracheal deviation. CVS: Tachycardia S1/S2 +, 2/6 SEM,  no gallops, no carotid bruit.  Pulmonary: Effort and breath sounds normal, no stridor, rhonchi, wheezes, rales.  Abdominal: Soft. BS +,  no distension, tenderness, rebound or guarding.  Musculoskeletal: Normal range of motion. No edema and no tenderness.  Neuro: Alert. CN 2-12 grossly intact. No focal deficits. Skin: Skin is warm and dry. No rash noted. Psychiatric: Normal mood and affect.       LABORATORY PANEL:   CBC Recent Labs  Lab 05/06/17 0305  WBC 9.1  HGB 10.6*  HCT 31.6*  PLT 373   ------------------------------------------------------------------------------------------------------------------  Chemistries  Recent Labs  Lab 05/07/17 0504  NA 136  K 3.5  CL 95*  CO2 28  GLUCOSE 120*  BUN 83*  CREATININE 1.79*  CALCIUM 8.8*  MG 2.1   ------------------------------------------------------------------------------------------------------------------  Cardiac Enzymes Recent Labs  Lab 05/01/17 0219 05/01/17 0810 05/06/17 0305  TROPONINI 10.60* 6.92* 6.27*   ------------------------------------------------------------------------------------------------------------------  RADIOLOGY:  Dg Chest Port 1 View  Result Date: 05/06/2017 CLINICAL DATA:  Respiratory failure. EXAM: PORTABLE CHEST 1 VIEW COMPARISON:  05/04/2017 FINDINGS: There is persistent bilateral interstitial thickening, although hazy airspace opacity has mostly resolved and pleural effusions have decreased in size. There are no new lung abnormalities. No pneumothorax. IMPRESSION: 1. Improved, but not resolved, congestive heart failure. Electronically Signed   By: Lajean Manes M.D.   On: 05/06/2017 07:33     ASSESSMENT AND PLAN:  84 year old male with history of CAD status post CABG, PAD, hypertension , chronic kidney disease stage III and DVT on anticoagulation who presented due to shortness of breath.  1. Non-ST elevation MI: Troponin max was 12.08 Due to acute on chronic kidney disease he is currently not a candidate for cardiac catheterization Continue aspirin, Plavix statin, Coreg, isosorbide  Cardiology consultation appreciated Echocardiogram shows ejection fraction 35-40% with probable hypokinesis of the apical anterior,lateral, and apical myocardium. Probable hypokinesis of the basal-midinferolateral myocardium At this point medical management is advised.  2. Acute  hypoxic respiratory failure due to Acute on chronic combined systolic and diastolic heart failure with ejection fraction 35-40%  heart failure and pulmonary hypertension:  Continue to wean to  nasal cannula as tolerated  Continue IV Lasix and MILRINONE for inotropic support. Plan to wean Milrinone today and off tomorrow Monitor intake and output daily weight Echo shows elevated right heart pressure. Chest x-ray shows improving CHF.  3. Hyperlipidemia: Continues Zetia and Lipitor  4. Hypothyroidism: Continue Synthroid  5. Essential hypertension: Continue Coreg  and isosorbide 6. Acute on chronic kidney disease stage III: Nephrology consultation appreciated acute kidney injury in the setting of cardiorenal syndrome  Creatinine improving with diuresis/milrinone  Renal ultrasound reviewed, no obstruction seen.   7. History of DVT: Off of heparin Will be on Eliquis as per cardiology RECS  Dopplers were negative for acute DVT    Management plans discussed with the patient and he is in agreement  CODE STATUS: full  TOTAL TIME TAKING CARE OF THIS PATIENT: 22 minutes.    POSSIBLE D/C 3-4 days, DEPENDING ON CLINICAL CONDITION.   Davari Lopes M.D on 05/07/2017 at 11:33 AM  Between 7am to 6pm - Pager - [phone] After 6pm go to [email or web address] - password [email or web address] [facility]  Office  [phone]  CC: Primary care physician; [name], MD  Note: This dictation was prepared with Dragon dictation along with smaller phrase technology. Any transcriptional errors that result from this process are unintentional.

## 2017-05-07 NOTE — Progress Notes (Addendum)
Dr Mortimer Fries informed of rectal bleeding with last stool, and rectal pain not improving.  ORder new suppository  Also informed Dr Mortimer Fries that I was entered patient's room to place a purple band on him.  When I told the patient and his wife what the purple band meant they both stated that the patient does wish to be resuscitated at this time and did not feel that he ever gave any indication that he wanted to be a DNR.  Per Dr Mortimer Fries patient's code status was changed to "Full Code."

## 2017-05-07 NOTE — Progress Notes (Signed)
ANTICOAGULATION CONSULT NOTE   Pharmacy Consult for heparin drip Indication: chest pain/ACS  No Known Allergies  Patient Measurements: Height: 5\' 3"  (160 cm) Weight: 130 lb 4.7 oz (59.1 kg) IBW/kg (Calculated) : 56.9 Heparin Dosing Weight: 67 kg  Vital Signs: Temp: 98.4 F (36.9 C) (03/04 0100) Temp Source: Oral (03/03 2042) BP: 121/53 (03/04 0200) Pulse Rate: 90 (03/04 0200)  Labs: Recent Labs    05/05/17 0508  05/05/17 1549 05/06/17 0305 05/07/17 0504  HGB 10.4*  --   --  10.6*  --   HCT 31.3*  --   --  31.6*  --   PLT 338  --   --  373  --   HEPARINUNFRC  --    < > 0.41 0.53 0.65  CREATININE 1.97*  --   --  1.74*  --   TROPONINI  --   --   --  6.27*  --    < > = values in this interval not displayed.    Estimated Creatinine Clearance: 24.5 mL/min (A) (by C-G formula based on SCr of 1.74 mg/dL (H)).   Medical History: Past Medical History:  Diagnosis Date  . Allergic rhinitis due to pollen    as child--better now  . Chronic kidney disease, stage III (moderate) (HCC)   . Coronary artery disease   . DVT, lower extremity, recurrent ([place])   . Glaucoma    [facility]   . History of prostate cancer 2004  . Hyperlipidemia   . Hypertension   . Hypothyroidism   . Impaired fasting glucose   . Spinal stenosis of lumbar region with radiculopathy     Assessment: Pharmacy consulted to dose and monitor heparin drip in this 84 year old man for ACS. Patient on rivaroxaban as an outpatient and initially monitored via APTTs. Patient now being managed with anti-Xa levels and currently receiving heparin at 1150 units/hr   Goal of Therapy:  APTT 66-102 seconds Heparin level 0.3-0.7 units/ml Monitor platelets by anticoagulation protocol: Yes   Plan:  Bolus heparin 1000 units and increase rate to 1250 units/hr and obtain anti-Xa level 0800.   3/2 08:20 HL = 0.50.  Continue current drip rate.  Recheck HL today at 16:00. 3/2 15:49 HL = 0.41.  Continue current drip rate.   Recheck HL tomorrow at 0500.  03/03 @ 0300 HL 0.53 therapeutic. Will continue current rate and recheck tomorrow w/ am labs. CBC stable.  03/04 @ 0500 HL 0.65 therapeutic. Will continue current rate and recheck next level w/ am labs.  Pharmacy will continue to monitor and adjust per consult.   Tobie Lords, PharmD, BCPS Clinical Pharmacist 05/07/2017

## 2017-05-08 DIAGNOSIS — N184 Chronic kidney disease, stage 4 (severe): Secondary | ICD-10-CM

## 2017-05-08 DIAGNOSIS — I5043 Acute on chronic combined systolic (congestive) and diastolic (congestive) heart failure: Secondary | ICD-10-CM

## 2017-05-08 DIAGNOSIS — I5042 Chronic combined systolic (congestive) and diastolic (congestive) heart failure: Secondary | ICD-10-CM

## 2017-05-08 LAB — CBC
HEMATOCRIT: 33.3 % — AB (ref 40.0–52.0)
HEMOGLOBIN: 11 g/dL — AB (ref 13.0–18.0)
MCH: 29.9 pg (ref 26.0–34.0)
MCHC: 33.1 g/dL (ref 32.0–36.0)
MCV: 90.2 fL (ref 80.0–100.0)
Platelets: 402 10*3/uL (ref 150–440)
RBC: 3.69 MIL/uL — AB (ref 4.40–5.90)
RDW: 14.6 % — ABNORMAL HIGH (ref 11.5–14.5)
WBC: 9.8 10*3/uL (ref 3.8–10.6)

## 2017-05-08 LAB — BASIC METABOLIC PANEL
Anion gap: 12 (ref 5–15)
BUN: 73 mg/dL — AB (ref 6–20)
CHLORIDE: 96 mmol/L — AB (ref 101–111)
CO2: 29 mmol/L (ref 22–32)
Calcium: 8.9 mg/dL (ref 8.9–10.3)
Creatinine, Ser: 1.57 mg/dL — ABNORMAL HIGH (ref 0.61–1.24)
GFR calc Af Amer: 44 mL/min — ABNORMAL LOW (ref >60)
GFR calc non Af Amer: 38 mL/min — ABNORMAL LOW (ref >60)
GLUCOSE: 118 mg/dL — AB (ref 65–99)
POTASSIUM: 3.8 mmol/L (ref 3.5–5.1)
Sodium: 137 mmol/L (ref 135–145)

## 2017-05-08 MED ORDER — VALACYCLOVIR HCL 500 MG PO TABS
1000.0000 mg | ORAL_TABLET | Freq: Every day | ORAL | Status: DC
  Administered 2017-05-08 – 2017-05-10 (×3): 1000 mg via ORAL
  Filled 2017-05-08 (×3): qty 2

## 2017-05-08 MED ORDER — HYDRALAZINE HCL 10 MG PO TABS
10.0000 mg | ORAL_TABLET | Freq: Three times a day (TID) | ORAL | Status: DC
  Administered 2017-05-08 – 2017-05-10 (×8): 10 mg via ORAL
  Filled 2017-05-08 (×9): qty 1

## 2017-05-08 MED ORDER — ENSURE ENLIVE PO LIQD
237.0000 mL | Freq: Two times a day (BID) | ORAL | Status: DC
  Administered 2017-05-08 – 2017-05-10 (×2): 237 mL via ORAL

## 2017-05-08 MED ORDER — FUROSEMIDE 40 MG PO TABS
40.0000 mg | ORAL_TABLET | Freq: Once | ORAL | Status: AC
  Administered 2017-05-08: 40 mg via ORAL
  Filled 2017-05-08: qty 1

## 2017-05-08 MED ORDER — CARVEDILOL 3.125 MG PO TABS
3.1250 mg | ORAL_TABLET | Freq: Once | ORAL | Status: AC
  Administered 2017-05-08: 3.125 mg via ORAL
  Filled 2017-05-08: qty 1

## 2017-05-08 NOTE — Progress Notes (Signed)
Per Dr.  Benjie Karvonen, order PT and OT consults

## 2017-05-08 NOTE — Progress Notes (Signed)
BP 104/47. Dr. Caryl Comes notified and aware of due meds. Instructed to hold the lasix and coreg that is ordered and instead give 40mg  lasix PO now and 3.125mg  coreg PO at 2200.

## 2017-05-08 NOTE — Progress Notes (Signed)
[facility]  ROUNDING NOTE   Subjective:  Patient seen at bedside. He is currently sitting up. Less short of breath at the moment.   Objective:  Vital signs in last 24 hours:  Temp:  [98 F (36.7 C)-98.3 F (36.8 C)] 98.3 F (36.8 C) (03/05 0022) Pulse Rate:  [67-98] 87 (03/05 0700) Resp:  [12-29] 13 (03/05 0700) BP: (102-150)/(47-73) 150/62 (03/05 0808) SpO2:  [84 %-97 %] 91 % (03/05 0700) Weight:  [60.3 kg (132 lb 15 oz)] 60.3 kg (132 lb 15 oz) (03/05 0329)  Weight change: -2 kg (-6.6 oz) Filed Weights   05/06/17 2047 05/07/17 0505 05/08/17 0329  Weight: 62.3 kg (137 lb 5.6 oz) 59.1 kg (130 lb 4.7 oz) 60.3 kg (132 lb 15 oz)    Intake/Output: I/O last 3 completed shifts: In: 1205.8 [P.O.:937; I.V.:268.8] Out: 3760 [Urine:3760]   Intake/Output this shift:  No intake/output data recorded.  Physical Exam: General: NAD  Head: Normocephalic, atraumatic. Moist oral mucosal membranes  Eyes: Anicteric  Neck: Supple, trachea midline  Lungs:  CTAB, normal effort  Heart: irregular, +murmur  Abdomen:  Soft, nontender, BS  Extremities: no peripheral edema.  Neurologic: Nonfocal, moving all four extremities  Skin: No lesions        Basic Metabolic Panel: Recent Labs  Lab 05/04/17 0502 05/05/17 0508 05/06/17 0305 05/07/17 0504 05/07/17 1303  NA 138 138 137 136 133*  K 4.0 3.8 3.4* 3.5 3.6  CL 103 100* 98* 95* 95*  CO2 20* 24 25 28 25   GLUCOSE 140* 148* 146* 120* 205*  BUN 100* 101* 93* 83* 83*  CREATININE 1.91* 1.97* 1.74* 1.79* 1.63*  CALCIUM 8.6* 8.4* 8.6* 8.8* 8.7*  MG  --   --   --  2.1  --   PHOS  --   --   --  3.5  --     Liver Function Tests: Recent Labs  Lab 05/07/17 0504  ALBUMIN 2.5*   No results for input(s): LIPASE, AMYLASE in the last 168 hours. No results for input(s): AMMONIA in the last 168 hours.  CBC: Recent Labs  Lab 05/04/17 0502 05/05/17 0508 05/06/17 0305 05/07/17 1303 05/08/17 0405  WBC 10.8* 10.0 9.1 10.2  9.8  NEUTROABS  --   --   --  8.2*  --   HGB 11.2* 10.4* 10.6* 10.9* 11.0*  HCT 33.5* 31.3* 31.6* 33.7* 33.3*  MCV 91.0 90.1 89.5 90.3 90.2  PLT 333 338 373 406 402    Cardiac Enzymes: Recent Labs  Lab 05/06/17 0305  TROPONINI 6.27*    BNP: Invalid input(s): POCBNP  CBG: Recent Labs  Lab 05/03/17 0133  GLUCAP 280*    Microbiology: Results for orders placed or performed during the hospital encounter of 04/30/17  MRSA PCR Screening     Status: None   Collection Time: 05/03/17  1:32 AM  Result Value Ref Range Status   MRSA by PCR NEGATIVE NEGATIVE Final    Comment:        The GeneXpert MRSA Assay (FDA approved for NASAL specimens only), is one component of a comprehensive MRSA colonization surveillance program. It is not intended to diagnose MRSA infection nor to guide or monitor treatment for MRSA infections. Performed at [facility], [address] [place], [place] [address]     Coagulation Studies: No results for input(s): LABPROT, INR in the last 72 hours.  Urinalysis: No results for input(s): COLORURINE, LABSPEC, [place], [place], [place], [place], [place], [place], [place], NITRITE, LEUKOCYTESUR  in the last 72 hours.  Invalid input(s): APPERANCEUR    Imaging: No results found.   Medications:    . apixaban  2.5 mg Oral BID  . aspirin EC  81 mg Oral Daily  . atorvastatin  80 mg Oral Daily  . calcium citrate  200 mg of elemental calcium Oral Daily  . carvedilol  6.25 mg Oral BID WC  . cholecalciferol  4,000 Units Oral Daily  . docusate sodium  100 mg Oral BID  . ezetimibe  10 mg Oral Daily  . furosemide  60 mg Intravenous BID  . hydrALAZINE  10 mg Oral Q8H  . hydrocortisone-pramoxine  1 applicator Rectal BID  . isosorbide mononitrate  60 mg Oral Daily  . latanoprost  1 drop Both Eyes QHS  . levothyroxine  75 mcg Oral QAC breakfast  . multivitamin with minerals  1 tablet Oral Daily  . omega-3 acid ethyl esters   Oral  BID  . senna  1 tablet Oral Daily  . sodium chloride flush  3 mL Intravenous Q12H  . timolol  1 drop Both Eyes BID   acetaminophen, ALPRAZolam, ipratropium-albuterol, morphine injection, nitroGLYCERIN, nitroGLYCERIN, ondansetron (ZOFRAN) IV, phenol, traMADol  Assessment/ Plan:  Mr. Saint Hank is a 84 y.o. white male with hypertension, coronary artery disease, history of DVT, glaucoma, history of prostate cancer, hyperlipidemia, hypothyroidism, aortic stenosis, diastolic congestive heart failure who is admitted for acute coronary syndrome.   Hospital Course: Admitted on 04/30/2017 for acute exacerbation of congestive heart failure and acute coronary syndrome. Placed on heparin gtt. Cardiology recommends medical mangement. Moved to ICU on 2/28 and placed on BIPAP. Milrinone started on 3/1 by Cardiology. Transitioned to HFNC. Sunday on 8L [place] O2.   1. Acute renal failure on chronic kidney disease stage III: Baseline creatinine 1.48, GFR of 12/2016. Bland urine. Acute renal failure from acute coronary syndrome and acute cardiorenal syndrome.  Renal ultrasound consistent with left sided renal artery stenosis.  -Awaiting new renal function testing this a.m.  Good urine output noted yesterday.  Continue current dosage of Lasix.  2. Hypertension: with acute diastolic/systolic congestive heart failure/aortic stenosis and coronary syndrome.  Acute respiratory failure with hypoxia, improved.  -Awaiting new renal function testing today.  Consider decreasing Lasix dosage however defer this to cardiology.  3. Anemia of chronic kidney disease: IMA globin up to 11.0.  No indication for Procrit at the moment.   LOS: 8 Clarisse Rodriges 3/5/20198:49 AM

## 2017-05-08 NOTE — Progress Notes (Signed)
[place] at [place] NAME: Dennis Burns    MR#:  [id number]  DATE OF BIRTH:  June 21, 1928  SUBJECTIVE:   Patient sitting in chair this morning. Shortness of breath is resolving. Patient is doing well. Wife is at bedside.   REVIEW OF SYSTEMS:    Review of Systems  Constitutional: Negative for fever, chills weight loss HENT: Negative for ear pain, nosebleeds, congestion, facial swelling, rhinorrhea, neck pain, neck stiffness and ear discharge.   Respiratory: Negative for cough, denies SOB Cardiovascular: Negative for chest pain, palpitations and leg swelling.  Gastrointestinal: Negative for heartburn, abdominal pain, vomiting, diarrhea or consitpation Genitourinary: Negative for dysuria, urgency, frequency, hematuria Musculoskeletal: Negative for back pain or joint pain Neurological: Negative for dizziness, seizures, syncope, focal weakness,  numbness and headaches.  Hematological: Does not bruise/bleed easily.  Psychiatric/Behavioral: Negative for hallucinations, confusion, dysphoric mood    Tolerating Diet: yes      DRUG ALLERGIES:  No Known Allergies  VITALS:  Blood pressure (!) 109/42, pulse 74, temperature 98.6 F (37 C), temperature source Axillary, resp. rate (!) 21, height 5\' 3"  (1.6 m), weight 60.3 kg (132 lb 15 oz), SpO2 91 %.  PHYSICAL EXAMINATION:  Constitutional: Appears well-developed and well-nourished. No distress. HENT: Normocephalic. Patient on nasal cannula  Eyes: Conjunctivae and EOM are normal. PERRLA, no scleral icterus.  Neck: Normal ROM. Neck supple. No tracheal deviation. CVS: Tachycardia S1/S2 +, 2/6 SEM,  no gallops, no carotid bruit.  Pulmonary: Effort and breath sounds normal, no stridor, rhonchi, wheezes, rales.  Abdominal: Soft. BS +,  no distension, tenderness, rebound or guarding.  Musculoskeletal: Normal range of motion. No edema and no tenderness.  Neuro: Alert. CN 2-12 grossly intact. No focal  deficits. Skin: Skin is warm and dry. No rash noted. Psychiatric: Normal mood and affect.      LABORATORY PANEL:   CBC Recent Labs  Lab 05/08/17 0405  WBC 9.8  HGB 11.0*  HCT 33.3*  PLT 402   ------------------------------------------------------------------------------------------------------------------  Chemistries  Recent Labs  Lab 05/07/17 0504  05/08/17 0405  NA 136   < > 137  K 3.5   < > 3.8  CL 95*   < > 96*  CO2 28   < > 29  GLUCOSE 120*   < > 118*  BUN 83*   < > 73*  CREATININE 1.79*   < > 1.57*  CALCIUM 8.8*   < > 8.9  MG 2.1  --   --    < > = values in this interval not displayed.   ------------------------------------------------------------------------------------------------------------------  Cardiac Enzymes Recent Labs  Lab 05/06/17 0305  TROPONINI 6.27*   ------------------------------------------------------------------------------------------------------------------  RADIOLOGY:  No results found.   ASSESSMENT AND PLAN:  84 year old male with history of CAD status post CABG, PAD, hypertension , chronic kidney disease stage III and DVT on anticoagulation who presented due to shortness of breath.  1. Non-ST elevation MI: Troponin max was 12.08 Due to acute on chronic kidney disease he is currently not a candidate for cardiac catheterization during this hospital stay. This will be deferred as an outpatient.  Continue aspirin, Plavix statin, Coreg, isosorbide  Cardiology consultation appreciated Echocardiogram shows ejection fraction 35-40% with probable hypokinesis of the apical anterior,lateral, and apical myocardium. Probable hypokinesis of the basal-midinferolateral myocardium   2. Acute hypoxic respiratory failure due to Acute on chronic combined systolic and diastolic heart failure with ejection fraction 35-40%  heart failure and pulmonary hypertension:  Continue to wean to nasal cannula as tolerated  Patient appears to be  euvolemic.  Milrinone discontinued  Hydralazine added for afterload reduction  Continue IV Lasix  Monitor intake and output daily weight Echo shows elevated right heart pressure.   3. Hyperlipidemia: Continues Zetia and Lipitor  4. Hypothyroidism: Continue Synthroid  5. Essential hypertension: Continue Coreg  and isosorbide 6. Acute on chronic kidney disease stage III: Nephrology consultation appreciated acute kidney injury in the setting of cardiorenal syndrome  Creatinine improving with diuresis/milrinone  Renal ultrasound reviewed, no obstruction seen.   7. History of DVT: Off of heparin Continue on Eliquis as per cardiology RECS  Dopplers were negative for acute DVT    Management plans discussed with the patient and wife and he is in agreement  CODE STATUS: full  TOTAL TIME TAKING CARE OF THIS PATIENT: 22 minutes.   Discussed with cardiology POSSIBLE D/C 2 days, DEPENDING ON CLINICAL CONDITION.   Dennis Burns M.D on 05/08/2017 at 12:28 PM  Between 7am to 6pm - Pager - [phone] After 6pm go to [email or web address] - password [email or web address] [facility]  Office  [phone]  CC: Primary care physician; [name], MD  Note: This dictation was prepared with Dragon dictation along with smaller phrase technology. Any transcriptional errors that result from this process are unintentional.

## 2017-05-08 NOTE — Progress Notes (Signed)
Report called to Lovena Le, pt to transport via wheelchair with his belongings and his chart and meds, VSS, pt using 1 liter PRN, up to chair this AM with minimal assist.

## 2017-05-08 NOTE — Progress Notes (Addendum)
Daily Progress Note   Patient Name: Rett Stehlik       Date: 05/08/2017 DOB: 1928-07-16  Age: 84 y.o. MRN#: [id number] Attending Physician: Bettey Costa, MD Primary Care Physician: [name], MD Admit Date: 04/30/2017  Reason for Consultation/Follow-up: Establishing goals of care  Subjective: Mr. Yeatts is resting in bed side chair, wife at bedside. He is improving and is off BIPAP and denies chest pain or discomfort. Off Milronone. He states he is feeling better. He does not like the low sodium hospital foods.   Code status discussed last visit. During that conversation, they stated if CPR was something that would not work or give the quality of life he wanted, he would not want it. He would not ever want to be placed on a ventilator. Discussed health status, desire to never want to be placed on a ventilator, and CCM recommendations with patient/family, patient shook his head and stated no that he would not want  CPR.      Patient changed to full code yesterday. Patient does not want to have [place] conversations today, and asked wife to discuss. Wife states code status is something they want to speak with their doctor about to determine if they should have it.  MOST form previously completed in chart discontinued.   Length of Stay: 8  Current Medications: Scheduled Meds:  . apixaban  2.5 mg Oral BID  . aspirin EC  81 mg Oral Daily  . atorvastatin  80 mg Oral Daily  . calcium citrate  200 mg of elemental calcium Oral Daily  . carvedilol  6.25 mg Oral BID WC  . cholecalciferol  4,000 Units Oral Daily  . docusate sodium  100 mg Oral BID  . ezetimibe  10 mg Oral Daily  . furosemide  60 mg Intravenous BID  . hydrALAZINE  10 mg Oral Q8H  . hydrocortisone-pramoxine  1 applicator Rectal  BID  . isosorbide mononitrate  60 mg Oral Daily  . latanoprost  1 drop Both Eyes QHS  . levothyroxine  75 mcg Oral QAC breakfast  . multivitamin with minerals  1 tablet Oral Daily  . omega-3 acid ethyl esters   Oral BID  . senna  1 tablet Oral Daily  . sodium chloride flush  3 mL Intravenous Q12H  . timolol  1 drop  Both Eyes BID    Continuous Infusions:   PRN Meds: acetaminophen, ALPRAZolam, ipratropium-albuterol, morphine injection, nitroGLYCERIN, nitroGLYCERIN, ondansetron (ZOFRAN) IV, phenol, traMADol  Physical Exam  Constitutional: No distress.  HENT:  Head: Atraumatic.  Pulmonary/Chest: Eff[place] normal.  [place]  Neurological: He is alert.  Oriented.            Vital Signs: BP (!) 150/62   Pulse 87   Temp 98.3 F (36.8 C) (Oral)   Resp 13   Ht 5\' 3"  (1.6 m)   Wt 60.3 kg (132 lb 15 oz)   SpO2 91%   BMI 23.55 kg/m  SpO2: SpO2: 91 % O2 Device: O2 Device: Nasal Cannula O2 Flow Rate: O2 Flow Rate (L/min): 3 L/min  Intake/output summary:   Intake/Output Summary (Last 24 hours) at 05/08/2017 0830 Last data filed at 05/08/2017 0500 Gross per 24 hour  Intake 748.9 ml  Output 1670 ml  Net -921.1 ml   LBM: Last BM Date: 05/08/17 Baseline Weight: Weight: 66.7 kg (147 lb) Most recent weight: Weight: 60.3 kg (132 lb 15 oz)       Palliative Assessment/Data: 50%    Flowsheet Rows     Most Recent Value  Intake Tab  Referral Department  Hospitalist  Unit at Time of Referral  Med/Surg Unit  Palliative Care Primary Diagnosis  Cardiac  Date Notified  05/03/17  Palliative Care Type  New Palliative care  Reason for referral  Clarify Goals of Care  Date of Admission  04/30/17  Date first seen by Palliative Care  05/03/17  # of days Palliative referral response time  0 Day(s)  # of days IP prior to Palliative referral  3  Clinical Assessment  Psychosocial & Spiritual Assessment  Palliative Care Outcomes      Patient Active Problem List   Diagnosis Date Noted  . NSTEMI  (non-ST elevated myocardial infarction) ([place]) 04/30/2017  . Effort angina ([place]) 02/14/2017  . Positive cardiac stress test 02/04/2017  . Preventative health care 05/03/2016  . Pulmonary hypertension ([place]) 05/03/2016  . PAD (peripheral artery disease) ([place]) 04/18/2016  . DOE (dyspnea on exertion) 11/02/2015  . Spinal stenosis of lumbar region with radiculopathy   . Coronary artery disease   . History of prostate cancer   . Hyperlipidemia   . Hypertension   . Chronic kidney disease, stage III (moderate) (HCC)   . DVT, lower extremity, recurrent ([place])   . Glaucoma   . Hypothyroidism   . Impaired fasting glucose     Palliative Care Assessment & Plan   Patient Profile: Mr. Stager is an 84 year old man with history of CAD status post CABG, presenting with shortness of breath and elevated troponin complicated by acute/subacute kidney injury.      Assessment/Recommendations/Plan:  Full code.    Recommend palliative to follow outpatient.     Code Status:    Code Status Orders  (From admission, onward)        Start     Ordered   05/04/17 1404  Do not attempt resuscitation (DNR)  Continuous    Question Answer Comment  In the event of cardiac or respiratory ARREST Do not call a "code blue"   In the event of cardiac or respiratory ARREST Do not perform Intubation, CPR, defibrillation or ACLS   In the event of cardiac or respiratory ARREST Use medication by any route, position, wound care, and other measures to relive pain and suffering. May use oxygen, suction and manual treatment of airway obstruction as  needed for comfort.      05/04/17 1403    Code Status History    Date Active Date Inactive Code Status Order ID Comments User Context   04/30/2017 20:28 05/04/2017 14:03 Full Code [id number]  Gorden Harms, MD Inpatient   02/14/2017 16:45 02/15/2017 15:07 Full Code [id number]  Wellington Hampshire, MD Inpatient    Advance Directive Documentation     Most Recent Value  Type  of Advance Directive  Healthcare Power of Attorney, Living will  Pre-existing out of facility DNR order (yellow form or pink MOST form)  No data  "MOST" Form in Place?  No data       Prognosis:   Unable to determine  Discharge Planning:  To Be Determined  Care plan was discussed with  RN  Thank you for allowing the Palliative Medicine Team to assist in the care of this patient.   Total Time 25 min Prolonged Time Billed  no      Greater than 50%  of this time was spent counseling and coordinating care related to the above assessment and plan.  Asencion Gowda, NP  Please contact Palliative Medicine Team phone at [phone] for questions and concerns.

## 2017-05-08 NOTE — Progress Notes (Signed)
Progress Note  Patient Name: Dennis Burns Date of Encounter: 05/08/2017  Primary Cardiologist: [name]  Subjective   SOB improving. No chest pain. Loss of voice. Feels weak. Minus 1 L for the past 24 hours and net - 12 L for the admission. Weight down 15 pounds from 147-->132 pounds. Remains on milrinone infusion. Renal function pending this morning. BNP 2914-->3671. Magnesium at goal. Potassium 3.6. Renal function from 3/4 of 1.63 which is improved from 1.79 earlier on 3/4.   Inpatient Medications    Scheduled Meds: . apixaban  2.5 mg Oral BID  . aspirin EC  81 mg Oral Daily  . atorvastatin  80 mg Oral Daily  . calcium citrate  200 mg of elemental calcium Oral Daily  . carvedilol  6.25 mg Oral BID WC  . cholecalciferol  4,000 Units Oral Daily  . docusate sodium  100 mg Oral BID  . ezetimibe  10 mg Oral Daily  . furosemide  60 mg Intravenous BID  . hydrocortisone-pramoxine  1 applicator Rectal BID  . isosorbide mononitrate  60 mg Oral Daily  . latanoprost  1 drop Both Eyes QHS  . levothyroxine  75 mcg Oral QAC breakfast  . multivitamin with minerals  1 tablet Oral Daily  . omega-3 acid ethyl esters   Oral BID  . senna  1 tablet Oral Daily  . sodium chloride flush  3 mL Intravenous Q12H  . timolol  1 drop Both Eyes BID   Continuous Infusions: . milrinone 0.125 mcg/kg/min (05/07/17 2148)   PRN Meds: acetaminophen, ALPRAZolam, ipratropium-albuterol, morphine injection, nitroGLYCERIN, nitroGLYCERIN, ondansetron (ZOFRAN) IV, phenol, traMADol   Vital Signs    Vitals:   05/08/17 0400 05/08/17 0500 05/08/17 0600 05/08/17 0700  BP: 124/73 (!) 122/58 127/66 (!) 126/59  Pulse: 72 79 80 87  Resp: 14 17 14 13   Temp:      TempSrc:      SpO2: 95% 93% 93% 91%  Weight:      Height:        Intake/Output Summary (Last 24 hours) at 05/08/2017 0740 Last data filed at 05/08/2017 0500 Gross per 24 hour  Intake 990.6 ml  Output 2020 ml  Net -1029.4 ml   Filed Weights   05/06/17  2047 05/07/17 0505 05/08/17 0329  Weight: 137 lb 5.6 oz (62.3 kg) 130 lb 4.7 oz (59.1 kg) 132 lb 15 oz (60.3 kg)    Telemetry    NSR, frequent PVCs, 7 beats of SVT - Personally Reviewed  ECG    n/a - Personally Reviewed  Physical Exam   GEN: Frail and elderly appearing. No acute distress.   Neck: Mild JVD. Cardiac: RRR with premature beats, III/VI systolic murmur at the RUSB, no rubs, or gallops.  Respiratory: Mildly diminished along the bilateral bases.  GI: Soft, nontender, non-distended.   MS: No edema; No deformity. Neuro:  Alert and oriented x 3; Nonfocal.  Psych: Normal affect.  Labs    Chemistry Recent Labs  Lab 05/06/17 0305 05/07/17 0504 05/07/17 1303  NA 137 136 133*  K 3.4* 3.5 3.6  CL 98* 95* 95*  CO2 25 28 25   GLUCOSE 146* 120* 205*  BUN 93* 83* 83*  CREATININE 1.74* 1.79* 1.63*  CALCIUM 8.6* 8.8* 8.7*  ALBUMIN  --  2.5*  --   GFRNONAA 34* 33* 37*  GFRAA 39* 38* 42*  ANIONGAP 14 13 13      Hematology Recent Labs  Lab 05/06/17 0305 05/07/17 1303 05/08/17 0405  WBC 9.1 10.2 9.8  RBC 3.53* 3.73* 3.69*  HGB 10.6* 10.9* 11.0*  HCT 31.6* 33.7* 33.3*  MCV 89.5 90.3 90.2  MCH 29.9 29.3 29.9  MCHC 33.4 32.5 33.1  RDW 14.7* 14.6* 14.6*  PLT 373 406 402    Cardiac Enzymes Recent Labs  Lab 05/01/17 0810 05/06/17 0305  TROPONINI 6.92* 6.27*   No results for input(s): TROPIPOC in the last 168 hours.   BNP Recent Labs  Lab 05/05/17 0508 05/07/17 0504  BNP 2,914.0* 3,671.0*     DDimer No results for input(s): DDIMER in the last 168 hours.   Radiology    No results found.  Cardiac Studies   TTE 05/01/2017:  Study Conclusions  - Left ventricle: The cavity size was normal. Wall thickness was   increased in a pattern of mild LVH. Systolic function was   moderately reduced. The estimated ejection fraction was in the   range of 35% to 40%. Probable hypokinesis of the apicalanterior,   lateral, and apical myocardium. Probable  hypokinesis of the   basal-midinferolateral myocardium. Doppler parameters are   consistent with restrictive physiology, indicative of decreased   left ventricular diastolic compliance and/or increased left   atrial pressure. Doppler parameters are consistent with high   ventricular filling pressure. - Aortic valve: Valve mobility was restricted. Transvalvular   velocity was increased. There was likely moderate stenosis;   degree of stenosis/transvavular gradient may be underestimated by   low LVEF. There was moderate regurgitation. - Mitral valve: Calcified annulus. Mildly thickened leaflets .   There was moderate to severe regurgitation. - Left atrium: The atrium was mildly dilated. - Right ventricle: Systolic function was mildly reduced. - Tricuspid valve: There was moderate regurgitation. - Pulmonary arteries: Systolic pressure was severely increased, in   the range of 70 mm Hg to 75 mm Hg. - Pericardium, extracardiac: There was a left pleural effusion.  Patient Profile     84 y.o. male with history of CAD s/p CABG in 2003, PAH, mod AS/AI, carotid dzs s/p R CEA, PAD, HTN, HL, DVT on chronic xarelto, CKD III, prostate CA, sciatica, and spinal stenosis, who was admitted from the office on 2/25 and found to have significant volume overload, AKI, and NSTEMI.  Assessment & Plan    1. NSTEMI: -Chest pain free -Medically managed given his age and acute on CKD stage IV -Has completed heparin infusion -Could consider outpatient cardiac cath if there is significant improvement and his renal function returns to baseline -Given he is on anticoagulation for DVT, Plavix has been stopped and he is on ASA and Eliquis   2. Acute on chronic combined CHF/pulmonary hypertension: -Volume status is overall much improved -He is 12 L negative for the admission with 15 pounds weight loss -Stop milrinone infusion today, with possible transfer to 2A on 3/6 -Check bmet -Continue IV Lasix 60 mg bid with  KCl repletion -Continue Coreg and Imdur -Add low-dose hydralazine 10 mg tid -CHF education -Daily weights, strict Is and Os  3. Valvular heart disease: -Moderate aortic stenosis with moderate aortic insufficiency and moderate to severe mitral regurgitation -Likely contributing to his CHF -Unlikely to be a candidate for invasive procedures  4. Acute on CKD stage IV: -Renal function improving with diuresis and milrinone infusion -Likely some component of cardiorenal syndrome   5. HTN: -BP well controlled/improved  6. History of DVT: -Heparin stopped on 3/4 -Resumed on Eliquis 2.5 mg bid   7. Dispo: -DNR -Prognosis guarded   For  questions or updates, please contact [place] Please consult [email or web address] for contact info under Cardiology/STEMI.    Signed, Christell Faith, PA-C [place] Pager: [phone] 05/08/2017, 7:40 AM

## 2017-05-08 NOTE — Progress Notes (Signed)
CC follow  Up resp distress  SUBJECTIVE He remains in no distress on HFNC.  We are decreasing FiO2.  [place] oxygen Patient reversed CODE STATUS He has not required BiPAP since yesterday He continues to deny chest pain His cognition remains intact No new complaints Weaned off Milrenone  Vitals:   05/08/17 0700 05/08/17 0800 05/08/17 0808 05/08/17 0900  BP: (!) 126/59 (!) 150/62 (!) 150/62 (!) 109/42  Pulse: 87 88  74  Resp: 13 20  (!) 21  Temp:  98.6 F (37 C)    TempSrc:  Axillary    SpO2: 91% 92%  91%  Weight:      Height:         Gen: NAD HEENT: NCAT, sclerae white, oropharynx normal Neck: No LAN, no JVD noted Lungs: Bilateral crackles, no wheezes Cardiovascular: Reg, systolic M unchanged Abdomen: Soft, NT, +BS Ext: Warm, no edema  Neuro: Remains grossly intact  BMP Latest Ref Rng & Units 05/08/2017 05/07/2017 05/07/2017  Glucose 65 - 99 mg/dL 118(H) 205(H) 120(H)  BUN 6 - 20 mg/dL 73(H) 83(H) 83(H)  Creatinine 0.61 - 1.24 mg/dL 1.57(H) 1.63(H) 1.79(H)  BUN/Creat Ratio 10 - 24 - - -  Sodium 135 - 145 mmol/L 137 133(L) 136  Potassium 3.5 - 5.1 mmol/L 3.8 3.6 3.5  Chloride 101 - 111 mmol/L 96(L) 95(L) 95(L)  CO2 22 - 32 mmol/L 29 25 28   Calcium 8.9 - 10.3 mg/dL 8.9 8.7(L) 8.8(L)    CBC Latest Ref Rng & Units 05/08/2017 05/07/2017 05/06/2017  WBC 3.8 - 10.6 K/uL 9.8 10.2 9.1  Hemoglobin 13.0 - 18.0 g/dL 11.0(L) 10.9(L) 10.6(L)  Hematocrit 40.0 - 52.0 % 33.3(L) 33.7(L) 31.6(L)  Platelets 150 - 440 K/uL 402 406 373    CXR: No new film  IMPRESSION: 84 yo white male admitted for progressive cardiorenal syndrome with hypoxic resp failure on milrinone infusion and heparin infusion Resp status slowly improving, weaned off milrinone this AM  Recent NSTEMI  Ischemic cardiomyopathy Aortic stenosis Mitral regurgitation History of pulmonary hypertension Acute pulmonary edema History of DVT AKI/CKD -creatinine relatively stable at 1.97, not far from baseline and improved since  admission DNR/DNI  PLAN/REC:  Continue supplemental oxygen to maintain SPO2 >90%.   Continue diuresis as permitted by BP and renal function Milrinone infusion per cardiology-now off Remains on heparin infusion, holding rivaroxaban  Ok to transfer to 2A  Corrin Parker, M.D.  [facility]  Medical Director [place] Director [facility] Cardio-Pulmonary Department

## 2017-05-08 NOTE — Care Management (Signed)
Patient moved out of icu today.  Currently on 1 liter of oxygen after being weaned from Bipap to HFNC to nasal cannula.  Required milrinone infusion while in icu. Being followed by nephrology for stage IV kidney disease. Have reached out to attending to request PT and OT consults

## 2017-05-08 NOTE — Progress Notes (Signed)
Initial Nutrition Assessment  DOCUMENTATION CODES:   Not applicable  INTERVENTION:  Provide Ensure Enlive po BID, each supplement provides 350 kcal and 20 grams of protein.  Continue daily MVI.  NUTRITION DIAGNOSIS:   Inadequate oral intake related to decreased appetite as evidenced by per patient/family report.  GOAL:   Patient will meet greater than or equal to 90% of their needs  MONITOR:   PO intake, Supplement acceptance, Labs, Weight trends, Skin, I & O's  REASON FOR ASSESSMENT:   Malnutrition Screening Tool    ASSESSMENT:   84 year old male with PMHx of spinal stenosis of lumbar region, prostate cancer, HLD, HTN, CKD stage III, hx DVT, glaucoma, hypothyroidism, CAD s/p CABG, PAD who is admitted with NSTEMI but not a candidate for cardiac catheterization during hospital stay in setting of CKD, acute hypoxic respiratory failure due to acute on chronic combined systolic and diastolic heart failure.   At time of RD assessment patient's wife asked RD to not wake patient. Wife provided history. She reports patient has had a decreased appetite lately and has not been as hungry. He has been eating smaller portion sizes at home. She reports for breakfast today he had a blueberry muffin, orange juice, milk, and 1/2 Ensure. He enjoys Ensure shakes. Patient did wake up at end of assessment so RD was able to complete NFPE.  Meal Completion: 15-50%   UBW had been 135-140 lbs. She reports his weight got up higher than that recently (up to 147 lbs) but it was from fluid. He is now down to 129 lbs during this admission.  Medications reviewed and include: Eliquis, calcium citrate 200 mg daily, vitamin D 4000 units daily, Colace, Lasix 60 mg BID IV, levothyroxine, MVI daily, Lovaza BID, senna.  Labs reviewed: Chloride 96, BUN 73, Creatinine 1.57.  Patient is at risk for malnutrition. He does not meet criteria at this time as the wasting in his legs are likely due to limited  mobility.  Discussed with RN and on rounds.  NUTRITION - FOCUSED PHYSICAL EXAM:    Most Recent Value  Orbital Region  Mild depletion  Upper Arm Region  Moderate depletion  Thoracic and Lumbar Region  Mild depletion  Buccal Region  Mild depletion  Temple Region  No depletion  Clavicle Bone Region  No depletion  Clavicle and Acromion Bone Region  No depletion  Scapular Bone Region  No depletion  Dorsal Hand  Mild depletion  Patellar Region  Moderate depletion [pt has limited mobility per wife]  Anterior Thigh Region  Moderate depletion [pt has limited mobility per wife]  Posterior Calf Region  Moderate depletion [pt has limited mobility per wife]  Edema (RD Assessment)  -- [non-pitting]  Hair  Reviewed  Eyes  Reviewed  Mouth  Reviewed  Skin  Reviewed  Nails  Reviewed     Diet Order:  Diet 2 gram sodium Room service appropriate? Yes; Fluid consistency: Thin  EDUCATION NEEDS:   No education needs have been identified at this time  Skin:  Skin Assessment: Reviewed RN Assessment(ecchymosis; rash left buttocks)  Last BM:  05/08/2017 - smear type 5  Height:   Ht Readings from Last 1 Encounters:  05/08/17 5\' 3"  (1.6 m)    Weight:   Wt Readings from Last 1 Encounters:  05/08/17 129 lb 3.2 oz (58.6 kg)    Ideal Body Weight:  56.4 kg  BMI:  Body mass index is 22.89 kg/m.  Estimated Nutritional Needs:   Kcal:  7510-2585 (  MSJ x 1.2-1.4)  Protein:  70-82 grams (1.2-1.4 grams/kg)  Fluid:  1.4 L/day (25 mL/kg)  Willey Blade, MS, RD, LDN Office: [phone] Pager: [phone] After Hours/Weekend Pager: [phone]

## 2017-05-09 LAB — BASIC METABOLIC PANEL
ANION GAP: 12 (ref 5–15)
BUN: 79 mg/dL — ABNORMAL HIGH (ref 6–20)
CALCIUM: 8.8 mg/dL — AB (ref 8.9–10.3)
CO2: 29 mmol/L (ref 22–32)
Chloride: 96 mmol/L — ABNORMAL LOW (ref 101–111)
Creatinine, Ser: 1.51 mg/dL — ABNORMAL HIGH (ref 0.61–1.24)
GFR calc non Af Amer: 40 mL/min — ABNORMAL LOW (ref >60)
GFR, EST AFRICAN AMERICAN: 46 mL/min — AB (ref >60)
Glucose, Bld: 132 mg/dL — ABNORMAL HIGH (ref 65–99)
Potassium: 4 mmol/L (ref 3.5–5.1)
Sodium: 137 mmol/L (ref 135–145)

## 2017-05-09 MED ORDER — FUROSEMIDE 40 MG PO TABS
40.0000 mg | ORAL_TABLET | Freq: Two times a day (BID) | ORAL | Status: DC
  Administered 2017-05-09 – 2017-05-10 (×3): 40 mg via ORAL
  Filled 2017-05-09 (×3): qty 1

## 2017-05-09 MED ORDER — WITCH HAZEL-GLYCERIN EX PADS
MEDICATED_PAD | CUTANEOUS | Status: DC | PRN
  Administered 2017-05-09 – 2017-05-10 (×4): via TOPICAL
  Filled 2017-05-09 (×2): qty 100

## 2017-05-09 MED ORDER — HYDROCORTISONE 2.5 % RE CREA
TOPICAL_CREAM | Freq: Two times a day (BID) | RECTAL | Status: DC
  Administered 2017-05-09 – 2017-05-10 (×3): via RECTAL
  Filled 2017-05-09 (×2): qty 28.35

## 2017-05-09 NOTE — Care Management (Signed)
CM informed that physical therapy will be recommending skilled nursing facility placement but has not documented assessment yet.  Informed that therapist was concerned that patient may not accept going to a facility.  CM met with patient,son and daughter.  Patient relays that he is from independent living at [facility].  He verbalized concern that he was only able to ambulate < 20 feet and experienced profound weakness with increase in his heart rate to  around 120.  He is open to consider going to the health care unit at [facility] if it is needed before he goes home.  Updated CSW.

## 2017-05-09 NOTE — Evaluation (Signed)
Physical Therapy Evaluation Patient Details Name: Dennis Burns MRN: [id number] DOB: 12/15/1928 Today's Date: 05/09/2017   History of Present Illness  84 yo male with onset of NSTEMI and volume overload was admitted and note CHF, AKI, dramatic deconditioning.  Has PMHx:  aortic insufficiency, mitral regurg, CAD, CABG, PAD, HTN, DVT, CKD 3, spinal stenosis.    Clinical Impression  Pt is up to walk with assistance to the chair then to the outdoors of his room, tolerating short trips with minimal cardiac change but with longer trip is tachycardic and resting pulse was 107 after finishing walk.  He is at 97% sat down to 88% with effort on 1L O2.  Will progress gait and transfers as tolerated, with monitoring of sats and with O2 as MD requires.  Pt is not currently able to wean down O2 with the effort.  Left up in chair with O2 to supplement his activity, and was then returned to bed as pt did not want to stay up due to peri pain.    Follow Up Recommendations SNF    Equipment Recommendations  None recommended by PT    Recommendations for Other Services       Precautions / Restrictions Precautions Precautions: Fall Precaution Comments: monitor O2 and HR Restrictions Weight Bearing Restrictions: No      Mobility  Bed Mobility Overal bed mobility: Needs Assistance Bed Mobility: Supine to Sit     Supine to sit: Min guard;Min assist     General bed mobility comments: pt declines with OT, fatigued; spoke with PT, pt required Min assist for sup<>sit with PT  Transfers Overall transfer level: Needs assistance Equipment used: Rolling walker (2 wheeled);1 person hand held assist Transfers: Sit to/from Stand Sit to Stand: Min guard;Min assist         General transfer comment: pt declines with OT, fatigued; spoke with PT, pt required min-mod assist for transfers with PT  Ambulation/Gait Ambulation/Gait assistance: Min guard;Min assist Ambulation Distance (Feet): 70 Feet Assistive  device: Rolling walker (2 wheeled);1 person hand held assist Gait Pattern/deviations: Step-through pattern;Step-to pattern;Decreased stride length;Wide base of support(unsteady knees) Gait velocity: reduced Gait velocity interpretation: Below normal speed for age/gender    Stairs            Wheelchair Mobility    Modified Rankin (Stroke Patients Only)       Balance Overall balance assessment: Needs assistance Sitting-balance support: Bilateral upper extremity supported;Feet supported Sitting balance-Leahy Scale: Fair     Standing balance support: Bilateral upper extremity supported;During functional activity Standing balance-Leahy Scale: Poor                               Pertinent Vitals/Pain Pain Assessment: No/denies pain(mild discomfort from internal hemorrhoid) Faces Pain Scale: Hurts even more Pain Location: perineum    Home Living Family/patient expects to be discharged to:: Private residence Living Arrangements: Spouse/significant other Available Help at Discharge: Family;Available 24 hours/day Type of Home: House(at [place]) Home Access: Level entry Entrance Stairs-Rails: None   Home Layout: One level Home Equipment: None      Prior Function Level of Independence: Independent         Comments: pt was walking independently with no AD prior to hospital, no falls, indep with ADL, stays active     Hand Dominance   Dominant Hand: Right    Extremity/Trunk Assessment   Upper Extremity Assessment Upper Extremity Assessment: Overall WFL for tasks  assessed(grossly 4+/5 )    Lower Extremity Assessment Lower Extremity Assessment: Defer to PT evaluation;Generalized weakness(at least 4/5)    Cervical / Trunk Assessment Cervical / Trunk Assessment: Normal  Communication   Communication: HOH  Cognition Arousal/Alertness: Awake/alert Behavior During Therapy: WFL for tasks assessed/performed Overall Cognitive Status: Within  Functional Limits for tasks assessed                                        General Comments      Exercises Other Exercises Other Exercises: Pt/family instructed in energy conservation strategies including pursed lip breathing, activity pacing, work simplification, falls prevention, with handout provided. Pt/family verbalizes understanding.   Assessment/Plan    PT Assessment Patient needs continued PT services  PT Problem List Decreased range of motion;Decreased activity tolerance;Decreased balance;Decreased mobility;Decreased coordination;Decreased knowledge of use of DME;Decreased safety awareness;Cardiopulmonary status limiting activity;Decreased skin integrity;Pain       PT Treatment Interventions DME instruction;Gait training;Functional mobility training;Therapeutic activities;Therapeutic exercise;Balance training;Neuromuscular re-education;Patient/family education    PT Goals (Current goals can be found in the Care Plan section)  Acute Rehab PT Goals Patient Stated Goal: go to rehab to help build endurance for activity and return to PLOF PT Goal Formulation: With patient/family Time For Goal Achievement: 05/23/17 Potential to Achieve Goals: Good    Frequency Min 2X/week   Barriers to discharge Decreased caregiver support will need continual supervision and monitoring of vitals with O2 at home    Co-evaluation               AM-PAC PT "6 Clicks" Daily Activity  Outcome Measure Difficulty turning over in bed (including adjusting bedclothes, sheets and blankets)?: A Little Difficulty moving from lying on back to sitting on the side of the bed? : Unable Difficulty sitting down on and standing up from a chair with arms (e.g., wheelchair, bedside commode, etc,.)?: Unable Help needed moving to and from a bed to chair (including a wheelchair)?: A Little Help needed walking in hospital room?: A Little Help needed climbing 3-5 steps with a railing? :  Total 6 Click Score: 12    End of Session Equipment Utilized During Treatment: Gait belt;Oxygen Activity Tolerance: Treatment limited secondary to medical complications (Comment)(O2 sats down to 88% on 1L and pulse 120 wiht effort) Patient left: in chair;with call bell/phone within reach;with chair alarm set;with family/visitor present Nurse Communication: Mobility status;Other (comment)(replace skin cream from cleaning perineum) PT Visit Diagnosis: Unsteadiness on feet (R26.81);Difficulty in walking, not elsewhere classified (R26.2);Pain Pain - Right/Left: (perineum)    Time: 6962-9528 PT Time Calculation (min) (ACUTE ONLY): 40 min   Charges:   PT Evaluation $PT Eval Moderate Complexity: 1 Mod PT Treatments $Gait Training: 8-22 mins $Therapeutic Exercise: 8-22 mins   PT G Codes:   PT G-Codes **NOT FOR INPATIENT CLASS** Functional Assessment Tool Used: AM-PAC 6 Clicks Basic Mobility    Ramond Dial 05/09/2017, 5:30 PM   Mee Hives, PT MS Acute Rehab Dept. Number: [place] [facility] [place]

## 2017-05-09 NOTE — Progress Notes (Signed)
Progress Note  Patient Name: Dennis Burns Date of Encounter: 05/09/2017  Primary Cardiologist: [name]  Subjective   Transferred out of the ICU to telemetry. Off milrinone gtt. Renal function continues to improve to 1.51 (baseline ~ 1.3-1.6). Weight up one pound from 129-->130. Net - 12.6 L for the admission. SOB continues to improve, remains on supplemental oxygen via nasal cannula. Notes constipation with hemorrhoids. No chest pain. Feels weak. Voice is improving.   Inpatient Medications    Scheduled Meds: . apixaban  2.5 mg Oral BID  . aspirin EC  81 mg Oral Daily  . atorvastatin  80 mg Oral Daily  . calcium citrate  200 mg of elemental calcium Oral Daily  . carvedilol  6.25 mg Oral BID WC  . cholecalciferol  4,000 Units Oral Daily  . docusate sodium  100 mg Oral BID  . ezetimibe  10 mg Oral Daily  . feeding supplement (ENSURE ENLIVE)  237 mL Oral BID BM  . furosemide  40 mg Oral BID  . hydrALAZINE  10 mg Oral Q8H  . hydrocortisone-pramoxine  1 applicator Rectal BID  . isosorbide mononitrate  60 mg Oral Daily  . latanoprost  1 drop Both Eyes QHS  . levothyroxine  75 mcg Oral QAC breakfast  . multivitamin with minerals  1 tablet Oral Daily  . omega-3 acid ethyl esters   Oral BID  . senna  1 tablet Oral Daily  . sodium chloride flush  3 mL Intravenous Q12H  . timolol  1 drop Both Eyes BID  . valACYclovir  1,000 mg Oral Daily   Continuous Infusions:  PRN Meds: acetaminophen, ALPRAZolam, ipratropium-albuterol, morphine injection, nitroGLYCERIN, nitroGLYCERIN, ondansetron (ZOFRAN) IV, phenol, traMADol   Vital Signs    Vitals:   05/08/17 1925 05/09/17 0343 05/09/17 0806 05/09/17 0810  BP: 129/74 (!) 137/51 139/73   Pulse: 84 82 87   Resp: 18 17 19    Temp: 97.8 F (36.6 C) 98.4 F (36.9 C) 97.9 F (36.6 C)   TempSrc: Oral Oral Oral   SpO2: 99% 97% 90% 97%  Weight:  130 lb 3.2 oz (59.1 kg)    Height:        Intake/Output Summary (Last 24 hours) at 05/09/2017  0846 Last data filed at 05/09/2017 0500 Gross per 24 hour  Intake 350 ml  Output 1150 ml  Net -800 ml   Filed Weights   05/08/17 0329 05/08/17 1430 05/09/17 0343  Weight: 132 lb 15 oz (60.3 kg) 129 lb 3.2 oz (58.6 kg) 130 lb 3.2 oz (59.1 kg)    Telemetry    NSR with occasional PACs, PVCs, NSVT, SVT - Personally Reviewed  ECG    n/a - Personally Reviewed  Physical Exam   GEN: Frail and elderly appearing; No acute distress.   Neck: No JVD. Cardiac: RRR, III/VI systolic murmur RUSB, no rubs, or gallops.  Respiratory: Clear to auscultation bilaterally.  GI: Soft, nontender, non-distended.   MS: No edema; No deformity. Neuro:  Alert and oriented x 3; Nonfocal.  Psych: Normal affect.  Labs    Chemistry Recent Labs  Lab 05/07/17 0504 05/07/17 1303 05/08/17 0405 05/09/17 0321  NA 136 133* 137 137  K 3.5 3.6 3.8 4.0  CL 95* 95* 96* 96*  CO2 28 25 29 29   GLUCOSE 120* 205* 118* 132*  BUN 83* 83* 73* 79*  CREATININE 1.79* 1.63* 1.57* 1.51*  CALCIUM 8.8* 8.7* 8.9 8.8*  ALBUMIN 2.5*  --   --   --  GFRNONAA 33* 37* 38* 40*  GFRAA 38* 42* 44* 46*  ANIONGAP 13 13 12 12      Hematology Recent Labs  Lab 05/06/17 0305 05/07/17 1303 05/08/17 0405  WBC 9.1 10.2 9.8  RBC 3.53* 3.73* 3.69*  HGB 10.6* 10.9* 11.0*  HCT 31.6* 33.7* 33.3*  MCV 89.5 90.3 90.2  MCH 29.9 29.3 29.9  MCHC 33.4 32.5 33.1  RDW 14.7* 14.6* 14.6*  PLT 373 406 402    Cardiac Enzymes Recent Labs  Lab 05/06/17 0305  TROPONINI 6.27*   No results for input(s): TROPIPOC in the last 168 hours.   BNP Recent Labs  Lab 05/05/17 0508 05/07/17 0504  BNP 2,914.0* 3,671.0*     DDimer No results for input(s): DDIMER in the last 168 hours.   Radiology    No results found.  Cardiac Studies   TTE 05/01/2017:  Study Conclusions  - Left ventricle: The cavity size was normal. Wall thickness was increased in a pattern of mild LVH. Systolic function was moderately reduced. The estimated  ejection fraction was in the range of 35% to 40%. Probable hypokinesis of the apicalanterior, lateral, and apical myocardium. Probable hypokinesis of the basal-midinferolateral myocardium. Doppler parameters are consistent with restrictive physiology, indicative of decreased left ventricular diastolic compliance and/or increased left atrial pressure. Doppler parameters are consistent with high ventricular filling pressure. - Aortic valve: Valve mobility was restricted. Transvalvular velocity was increased. There was likely moderate stenosis; degree of stenosis/transvavular gradient may be underestimated by low LVEF. There was moderate regurgitation. - Mitral valve: Calcified annulus. Mildly thickened leaflets . There was moderate to severe regurgitation. - Left atrium: The atrium was mildly dilated. - Right ventricle: Systolic function was mildly reduced. - Tricuspid valve: There was moderate regurgitation. - Pulmonary arteries: Systolic pressure was severely increased, in the range of 70 mm Hg to 75 mm Hg. - Pericardium, extracardiac: There was a left pleural effusion.   Patient Profile     84 y.o. male with history of CAD s/p CABG in 2003, PAH, mod AS/AI, carotid dzs s/p R CEA, PAD, HTN, HL, DVT on chronic xarelto, CKD III, prostate CA, sciatica, and spinal stenosis, who was admitted from the office on 2/25 and found to have significant volume overload, AKI, and NSTEMI.  Assessment & Plan    1. NSTEMI: -Chest pain free -Medically managed given his age and acute on CKD stage IV -Has completed heparin infusion -Could consider outpatient cardiac cath if there is significant improvement and his renal function returns to baseline -Given he is on anticoagulation for DVT, Plavix has been stopped and he is on ASA and Eliquis   2. Acute on chronic combined CHF/pulmonary hypertension: -Volume status is overall much improved -He is 12.6 L negative for the  admission with 14 pounds weight loss -Now off milrinone infusion as of 3/4 -Check bmet -Change IV Lasix to PO 40 mg bid with KCl repletion -Continue Coreg, Imdur, and low-dose hydralazine -CHF education -Daily weights, strict Is and Os  3. Valvular heart disease: -Moderate aortic stenosis with moderate aortic insufficiency and moderate to severe mitral regurgitation -Likely contributing to his CHF -Unlikely to be a candidate for invasive procedures  4. Acute on CKD stage IV: -Renal function improving with diuresis and milrinone infusion -Likely some component of cardiorenal syndrome  -Now off milrinone as of 3/5  5. HTN: -BP well controlled  6. History of DVT: -Heparin stopped on 3/4 -Resumed on Eliquis 2.5 mg bid   7. NSVT/SVT/PACs/PVCs: -Potassium at goal -  Recheck magnesium, at goal as of 3/4 -TSH normal -Continue Coreg  8. Dispo: -Changed to a full code now -Needs to ambbulate -Prognosis guarded    For questions or updates, please contact [place] Please consult [email or web address] for contact info under Cardiology/STEMI.    Signed, Christell Faith, PA-C [place] Pager: [phone] 05/09/2017, 8:46 AM

## 2017-05-09 NOTE — Progress Notes (Addendum)
[facility] [place] NAME: Dennis Burns    MR#:  [id number]  DATE OF BIRTH:  Nov 19, 1928  SUBJECTIVE:   Patient is doing well this am  REVIEW OF SYSTEMS:    Review of Systems  Constitutional: Negative for fever, chills weight loss HENT: Negative for ear pain, nosebleeds, congestion, facial swelling, rhinorrhea, neck pain, neck stiffness and ear discharge.   Respiratory: Negative for cough, denies SOB Cardiovascular: Negative for chest pain, palpitations and leg swelling.  Gastrointestinal: Negative for heartburn, abdominal pain, vomiting, diarrhea or consitpation Genitourinary: Negative for dysuria, urgency, frequency, hematuria Musculoskeletal: Negative for back pain or joint pain Neurological: Negative for dizziness, seizures, syncope, focal weakness,  numbness and headaches.  Hematological: Does not bruise/bleed easily.  Psychiatric/Behavioral: Negative for hallucinations, confusion, dysphoric mood    Tolerating Diet: yes      DRUG ALLERGIES:  No Known Allergies  VITALS:  Blood pressure 139/73, pulse 87, temperature 97.9 F (36.6 C), temperature source Oral, resp. rate 19, height 5\' 3"  (1.6 m), weight 59.1 kg (130 lb 3.2 oz), SpO2 97 %.  PHYSICAL EXAMINATION:  Constitutional: Appears well-developed and well-nourished. No distress. HENT: Normocephalic. Patient on nasal cannula  Eyes: Conjunctivae and EOM are normal. PERRLA, no scleral icterus.  Neck: Normal ROM. Neck supple. No tracheal deviation. CVS: Tachycardia S1/S2 +, 2/6 SEM,  no gallops, no carotid bruit.  Pulmonary: Effort and breath sounds normal, no stridor, rhonchi, wheezes, rales.  Abdominal: Soft. BS +,  no distension, tenderness, rebound or guarding.  Musculoskeletal: Normal range of motion. No edema and no tenderness.  Neuro: Alert. CN 2-12 grossly intact. No focal deficits. Skin: Skin is warm and dry. No rash noted. Psychiatric: Normal mood and affect.       LABORATORY PANEL:   CBC Recent Labs  Lab 05/08/17 0405  WBC 9.8  HGB 11.0*  HCT 33.3*  PLT 402   ------------------------------------------------------------------------------------------------------------------  Chemistries  Recent Labs  Lab 05/07/17 0504  05/09/17 0321  NA 136   < > 137  K 3.5   < > 4.0  CL 95*   < > 96*  CO2 28   < > 29  GLUCOSE 120*   < > 132*  BUN 83*   < > 79*  CREATININE 1.79*   < > 1.51*  CALCIUM 8.8*   < > 8.8*  MG 2.1  --   --    < > = values in this interval not displayed.   ------------------------------------------------------------------------------------------------------------------  Cardiac Enzymes Recent Labs  Lab 05/06/17 0305  TROPONINI 6.27*   ------------------------------------------------------------------------------------------------------------------  RADIOLOGY:  No results found.   ASSESSMENT AND PLAN:  84 year old male with history of CAD status post CABG, PAD, hypertension , chronic kidney disease stage III and DVT on anticoagulation who presented due to shortness of breath.  1. Non-ST elevation MI: Troponin max was 12.08 Due to acute on chronic kidney disease he is currently not a candidate for cardiac catheterization during this hospital stay. This will be deferred as an outpatient.  Continue aspirin,  statin, Coreg, isosorbide  Cardiology consultation appreciated Echocardiogram shows ejection fraction 35-40% with probable hypokinesis of the apical anterior,lateral, and apical myocardium. Probable hypokinesis of the basal-midinferolateral myocardium Patient not a candidate for cardiac rehabilitation until evaluated as an outpatient by cardiology with consideration of either stress test or cardiac catheterization.  2. Acute hypoxic respiratory failure due to Acute on chronic combined systolic and diastolic heart failure with ejection fraction 35-40%  heart failure and pulmonary hypertension:  Continue  to wean to nasal cannula as tolerated  Patient is euvolemic.  Milrinone discontinued  Hydralazine added for afterload reduction  Continue  Lasix  Monitor intake and output daily weight Echo shows elevated right heart pressure.   3. Hyperlipidemia: Continues Zetia and Lipitor  4. Hypothyroidism: Continue Synthroid  5. Essential hypertension: Continue Coreg  and isosorbide 6. Acute on chronic kidney disease stage III: Nephrology consultation appreciated acute kidney injury in the setting of cardiorenal syndrome  Creatinine improving with diuresis/milrinone  Renal ultrasound reviewed, no obstruction seen.   7. History of DVT: Off of heparin Continue on Eliquis as per cardiology RECS  Dopplers were negative for acute DVT  PT/OT consult  Management plans discussed with the patient and wife and he is in agreement  CODE STATUS: full  TOTAL TIME TAKING CARE OF THIS PATIENT: 22 minutes.    POSSIBLE D/C tomorrow, DEPENDING ON CLINICAL CONDITION.   Shaquia Berkley M.D on 05/09/2017 at 10:33 AM  Between 7am to 6pm - Pager - [phone] After 6pm go to [email or web address] - password [email or web address] [facility]  Office  [phone]  CC: Primary care physician; [name], MD  Note: This dictation was prepared with Dragon dictation along with smaller phrase technology. Any transcriptional errors that result from this process are unintentional.

## 2017-05-09 NOTE — Care Management Important Message (Signed)
Important Message  Patient Details  Name: Cylan Borum MRN: [id number] Date of Birth: 1929/03/02   Medicare Important Message Given:  Yes Signed IM notice given    Katrina Stack, RN 05/09/2017, 2:53 PM

## 2017-05-09 NOTE — Clinical Social Work Note (Addendum)
CSW was informed that patient will need short term rehab.  Patient is from [facility] living, however he will need SNF Placement.  CSW spoke to [facility], and they will review patients clinical information and contact insurance company for insurance authorization.  CSW contacted patient's wife Velva Harman [phone] and talked to her about SNF placement process and role of CSW.   Jones Broom. Leake, MSW, [place]  05/09/2017 3:16 PM

## 2017-05-09 NOTE — Evaluation (Signed)
Occupational Therapy Evaluation Patient Details Name: Dennis Burns MRN: [id number] DOB: Jun 09, 1928 Today's Date: 05/09/2017    History of Present Illness 84 yo male with onset of NSTEMI and volume overload was admitted and note CHF, AKI, dramatic deconditioning.  Has PMHx:  aortic insufficiency, mitral regurg, CAD, CABG, PAD, HTN, DVT, CKD 3, spinal stenosis.     Clinical Impression   Pt seen for OT evaluation this date. Prior to hospital admission, pt was active and independent in all aspects of life, living with spouse in [place] home with level entry. Currently pt is supervision to minimal assist for LB ADL limited primarily by cardiopulmonary status and significantly decreased activity tolerance. Pt declined mobility this afternoon (per conversation with PT, pt ambulated with min assist using RW approx 20 ft before O2 sats dropping and HR ~ 120). Pt/family instructed in energy conservation strategies including pursed lip breathing, activity pacing, work simplification, falls prevention, with handout provided. Pt/family verbalizes understanding. Pt would benefit from skilled OT to address noted impairments and functional limitations (see below for any additional details).  Upon hospital discharge, recommend pt discharge to STR to improve activity tolerance and help support return to PLOF.    Follow Up Recommendations  SNF    Equipment Recommendations  None recommended by OT    Recommendations for Other Services       Precautions / Restrictions Precautions Precautions: Fall Precaution Comments: monitor O2 and HR Restrictions Weight Bearing Restrictions: No      Mobility Bed Mobility Overal bed mobility: Needs Assistance             General bed mobility comments: pt declines with OT, fatigued; spoke with PT, pt required Min assist for sup<>sit with PT  Transfers Overall transfer level: Needs assistance               General transfer comment: pt declines with  OT, fatigued; spoke with PT, pt required min-mod assist for transfers with PT    Balance                                           ADL either performed or assessed with clinical judgement   ADL Overall ADL's : Needs assistance/impaired                                       General ADL Comments: Pt near baseline, just very weak and fatigues very quickly. Would benefit from education/training in AE/DME to improve independence and minimize SOB with ADL. Supervision to min assist level for seated shower and LB dressing tasks.     Vision Baseline Vision/History: Wears glasses Wears Glasses: At all times Patient Visual Report: No change from baseline       Perception     Praxis      Pertinent Vitals/Pain Pain Assessment: No/denies pain(mild discomfort from internal hemorrhoid)     Hand Dominance Right   Extremity/Trunk Assessment Upper Extremity Assessment Upper Extremity Assessment: Overall WFL for tasks assessed(grossly 4+/5 )   Lower Extremity Assessment Lower Extremity Assessment: Defer to PT evaluation;Generalized weakness(at least 4/5)   Cervical / Trunk Assessment Cervical / Trunk Assessment: Normal   Communication Communication Communication: HOH   Cognition Arousal/Alertness: Awake/alert Behavior During Therapy: WFL for tasks assessed/performed Overall Cognitive Status: Within Functional Limits for  tasks assessed                                     General Comments       Exercises Other Exercises Other Exercises: Pt/family instructed in energy conservation strategies including pursed lip breathing, activity pacing, work simplification, falls prevention, with handout provided. Pt/family verbalizes understanding.   Shoulder Instructions      Home Living Family/patient expects to be discharged to:: Private residence Living Arrangements: Spouse/significant other Available Help at Discharge: Family;Available  24 hours/day Type of Home: House(at [place]) Home Access: Level entry   Entrance Stairs-Rails: None Home Layout: One level     Bathroom Shower/Tub: Occupational psychologist: Standard     Home Equipment: None          Prior Functioning/Environment Level of Independence: Independent        Comments: pt was walking independently with no AD prior to hospital, no falls, indep with ADL, stays active        OT Problem List: Decreased activity tolerance;Cardiopulmonary status limiting activity;Decreased knowledge of use of DME or AE      OT Treatment/Interventions: Self-care/ADL training;Balance training;Therapeutic exercise;Therapeutic activities;Energy conservation;DME and/or AE instruction;Patient/family education    OT Goals(Current goals can be found in the care plan section) Acute Rehab OT Goals Patient Stated Goal: go to rehab to help build endurance for activity and return to PLOF OT Goal Formulation: With patient/family Time For Goal Achievement: 05/23/17 Potential to Achieve Goals: Good ADL Goals Pt Will Perform Lower Body Dressing: with modified independence;sit to/from stand(w/ comp. strategies/AE , HR <100, O2 sats >95% on RA) Pt Will Transfer to Toilet: with supervision;ambulating(comfort height toilet, LRAD, HR <100, O2 sats >95% on RA) Additional ADL Goal #1: Pt will verbalize plan for implementing at least 1 learned ECS to maximize safety, independence, and minimize SOB/over exertion during ADL/mobility.  OT Frequency: Min 2X/week   Barriers to D/C:            Co-evaluation              AM-PAC PT "6 Clicks" Daily Activity     Outcome Measure Help from another person eating meals?: None Help from another person taking care of personal grooming?: None Help from another person toileting, which includes using toliet, bedpan, or urinal?: A Little Help from another person bathing (including washing, rinsing, drying)?: A Little Help from  another person to put on and taking off regular upper body clothing?: None Help from another person to put on and taking off regular lower body clothing?: A Little 6 Click Score: 21   End of Session    Activity Tolerance: Patient limited by fatigue Patient left: in bed;with call bell/phone within reach;with bed alarm set;with family/visitor present  OT Visit Diagnosis: Other abnormalities of gait and mobility (R26.89);Muscle weakness (generalized) (M62.81)                Time: 8676-1950 OT Time Calculation (min): 28 min Charges:  OT General Charges $OT Visit: 1 Visit OT Evaluation $OT Eval Low Complexity: 1 Low OT Treatments $Self Care/Home Management : 8-22 mins  Jeni Salles, MPH, MS, OTR/L ascom [phone] 05/09/17, 5:16 PM

## 2017-05-09 NOTE — Progress Notes (Signed)
[facility]  ROUNDING NOTE   Subjective:  Renal function has improved. Creatinine down to 1.5 at the moment.   Objective:  Vital signs in last 24 hours:  Temp:  [97.8 F (36.6 C)-98.4 F (36.9 C)] 97.9 F (36.6 C) (03/06 0806) Pulse Rate:  [82-87] 87 (03/06 0806) Resp:  [17-19] 19 (03/06 0806) BP: (104-139)/(47-74) 139/73 (03/06 0806) SpO2:  [90 %-99 %] 97 % (03/06 0810) Weight:  [59.1 kg (130 lb 3.2 oz)] 59.1 kg (130 lb 3.2 oz) (03/06 0343)  Weight change: -1.695 kg (-11.8 oz) Filed Weights   05/08/17 0329 05/08/17 1430 05/09/17 0343  Weight: 60.3 kg (132 lb 15 oz) 58.6 kg (129 lb 3.2 oz) 59.1 kg (130 lb 3.2 oz)    Intake/Output: I/O last 3 completed shifts: In: 614.6 [P.O.:587; I.V.:27.6] Out: 2490 [Urine:2490]   Intake/Output this shift:  Total I/O In: 363 [P.O.:360; I.V.:3] Out: -   Physical Exam: General: NAD  Head: Normocephalic, atraumatic. Moist oral mucosal membranes  Eyes: Anicteric  Neck: Supple, trachea midline  Lungs:  CTAB, normal effort  Heart: irregular, +murmur  Abdomen:  Soft, nontender, BS  Extremities: no peripheral edema.  Neurologic: Nonfocal, moving all four extremities  Skin: No lesions        Basic Metabolic Panel: Recent Labs  Lab 05/06/17 0305 05/07/17 0504 05/07/17 1303 05/08/17 0405 05/09/17 0321  NA 137 136 133* 137 137  K 3.4* 3.5 3.6 3.8 4.0  CL 98* 95* 95* 96* 96*  CO2 25 28 25 29 29   GLUCOSE 146* 120* 205* 118* 132*  BUN 93* 83* 83* 73* 79*  CREATININE 1.74* 1.79* 1.63* 1.57* 1.51*  CALCIUM 8.6* 8.8* 8.7* 8.9 8.8*  MG  --  2.1  --   --   --   PHOS  --  3.5  --   --   --     Liver Function Tests: Recent Labs  Lab 05/07/17 0504  ALBUMIN 2.5*   No results for input(s): LIPASE, AMYLASE in the last 168 hours. No results for input(s): AMMONIA in the last 168 hours.  CBC: Recent Labs  Lab 05/04/17 0502 05/05/17 0508 05/06/17 0305 05/07/17 1303 05/08/17 0405  WBC 10.8* 10.0 9.1 10.2 9.8   NEUTROABS  --   --   --  8.2*  --   HGB 11.2* 10.4* 10.6* 10.9* 11.0*  HCT 33.5* 31.3* 31.6* 33.7* 33.3*  MCV 91.0 90.1 89.5 90.3 90.2  PLT 333 338 373 406 402    Cardiac Enzymes: Recent Labs  Lab 05/06/17 0305  TROPONINI 6.27*    BNP: Invalid input(s): POCBNP  CBG: Recent Labs  Lab 05/03/17 0133  GLUCAP 280*    Microbiology: Results for orders placed or performed during the hospital encounter of 04/30/17  MRSA PCR Screening     Status: None   Collection Time: 05/03/17  1:32 AM  Result Value Ref Range Status   MRSA by PCR NEGATIVE NEGATIVE Final    Comment:        The GeneXpert MRSA Assay (FDA approved for NASAL specimens only), is one component of a comprehensive MRSA colonization surveillance program. It is not intended to diagnose MRSA infection nor to guide or monitor treatment for MRSA infections. Performed at [facility], [address] [place], [place] [address]     Coagulation Studies: No results for input(s): LABPROT, INR in the last 72 hours.  Urinalysis: No results for input(s): COLORURINE, LABSPEC, [place], [place], [place], [place], [place], [place], [place], NITRITE, LEUKOCYTESUR  in the last 72 hours.  Invalid input(s): APPERANCEUR    Imaging: No results found.   Medications:    . apixaban  2.5 mg Oral BID  . aspirin EC  81 mg Oral Daily  . atorvastatin  80 mg Oral Daily  . calcium citrate  200 mg of elemental calcium Oral Daily  . carvedilol  6.25 mg Oral BID WC  . cholecalciferol  4,000 Units Oral Daily  . docusate sodium  100 mg Oral BID  . ezetimibe  10 mg Oral Daily  . feeding supplement (ENSURE ENLIVE)  237 mL Oral BID BM  . furosemide  40 mg Oral BID  . hydrALAZINE  10 mg Oral Q8H  . hydrocortisone   Rectal BID  . hydrocortisone-pramoxine  1 applicator Rectal BID  . isosorbide mononitrate  60 mg Oral Daily  . latanoprost  1 drop Both Eyes QHS  . levothyroxine  75 mcg Oral QAC breakfast  .  multivitamin with minerals  1 tablet Oral Daily  . omega-3 acid ethyl esters   Oral BID  . senna  1 tablet Oral Daily  . sodium chloride flush  3 mL Intravenous Q12H  . timolol  1 drop Both Eyes BID  . valACYclovir  1,000 mg Oral Daily   acetaminophen, ALPRAZolam, ipratropium-albuterol, morphine injection, nitroGLYCERIN, nitroGLYCERIN, ondansetron (ZOFRAN) IV, phenol, traMADol, witch hazel-glycerin  Assessment/ Plan:  [name] is a 84 y.o. white male with hypertension, coronary artery disease, history of DVT, glaucoma, history of prostate cancer, hyperlipidemia, hypothyroidism, aortic stenosis, diastolic congestive heart failure who is admitted for acute coronary syndrome.   Hospital Course: Admitted on 04/30/2017 for acute exacerbation of congestive heart failure and acute coronary syndrome. Placed on heparin gtt. Cardiology recommends medical mangement. Moved to ICU on 2/28 and placed on BIPAP. Milrinone started on 3/1 by Cardiology. Transitioned to HFNC. Sunday on 8L [place] O2.   1. Acute renal failure on chronic kidney disease stage III: Baseline creatinine 1.48, GFR of 12/2016. Bland urine. Acute renal failure from acute coronary syndrome and acute cardiorenal syndrome.  Renal ultrasound consistent with left sided renal artery stenosis.  -renal function continues to improve.  Creatinine down to 1.5.  Okay to continue furosemide.  2. Hypertension: with acute diastolic/systolic congestive heart failure/aortic stenosis and coronary syndrome.  Acute respiratory failure with hypoxia, improved.  -continue the patient on hydralazine, isosorbide mononitrate and carvedilol.  3. Anemia of chronic kidney disease: most recent hemoglobin was 11.  No indication for Procrit.   LOS: 9 Dennis Burns 3/6/20192:48 PM

## 2017-05-10 ENCOUNTER — Telehealth: Payer: Self-pay | Admitting: *Deleted

## 2017-05-10 DIAGNOSIS — I5042 Chronic combined systolic (congestive) and diastolic (congestive) heart failure: Secondary | ICD-10-CM | POA: Diagnosis not present

## 2017-05-10 DIAGNOSIS — N183 Chronic kidney disease, stage 3 (moderate): Secondary | ICD-10-CM | POA: Diagnosis not present

## 2017-05-10 DIAGNOSIS — R262 Difficulty in walking, not elsewhere classified: Secondary | ICD-10-CM | POA: Diagnosis not present

## 2017-05-10 DIAGNOSIS — N189 Chronic kidney disease, unspecified: Secondary | ICD-10-CM | POA: Diagnosis not present

## 2017-05-10 DIAGNOSIS — I214 Non-ST elevation (NSTEMI) myocardial infarction: Secondary | ICD-10-CM | POA: Diagnosis not present

## 2017-05-10 DIAGNOSIS — I25119 Atherosclerotic heart disease of native coronary artery with unspecified angina pectoris: Secondary | ICD-10-CM | POA: Diagnosis not present

## 2017-05-10 DIAGNOSIS — I509 Heart failure, unspecified: Secondary | ICD-10-CM | POA: Diagnosis not present

## 2017-05-10 DIAGNOSIS — I272 Pulmonary hypertension, unspecified: Secondary | ICD-10-CM | POA: Diagnosis not present

## 2017-05-10 DIAGNOSIS — J9601 Acute respiratory failure with hypoxia: Secondary | ICD-10-CM | POA: Diagnosis not present

## 2017-05-10 DIAGNOSIS — I5033 Acute on chronic diastolic (congestive) heart failure: Secondary | ICD-10-CM | POA: Diagnosis not present

## 2017-05-10 DIAGNOSIS — N184 Chronic kidney disease, stage 4 (severe): Secondary | ICD-10-CM | POA: Diagnosis not present

## 2017-05-10 DIAGNOSIS — Z7189 Other specified counseling: Secondary | ICD-10-CM | POA: Diagnosis not present

## 2017-05-10 DIAGNOSIS — I130 Hypertensive heart and chronic kidney disease with heart failure and stage 1 through stage 4 chronic kidney disease, or unspecified chronic kidney disease: Secondary | ICD-10-CM | POA: Diagnosis not present

## 2017-05-10 DIAGNOSIS — N179 Acute kidney failure, unspecified: Secondary | ICD-10-CM | POA: Diagnosis not present

## 2017-05-10 DIAGNOSIS — Z515 Encounter for palliative care: Secondary | ICD-10-CM | POA: Diagnosis not present

## 2017-05-10 DIAGNOSIS — M6281 Muscle weakness (generalized): Secondary | ICD-10-CM | POA: Diagnosis not present

## 2017-05-10 DIAGNOSIS — I2510 Atherosclerotic heart disease of native coronary artery without angina pectoris: Secondary | ICD-10-CM | POA: Diagnosis not present

## 2017-05-10 LAB — BASIC METABOLIC PANEL
Anion gap: 11 (ref 5–15)
BUN: 74 mg/dL — ABNORMAL HIGH (ref 6–20)
CHLORIDE: 96 mmol/L — AB (ref 101–111)
CO2: 30 mmol/L (ref 22–32)
CREATININE: 1.6 mg/dL — AB (ref 0.61–1.24)
Calcium: 9 mg/dL (ref 8.9–10.3)
GFR, EST AFRICAN AMERICAN: 43 mL/min — AB (ref >60)
GFR, EST NON AFRICAN AMERICAN: 37 mL/min — AB (ref >60)
Glucose, Bld: 136 mg/dL — ABNORMAL HIGH (ref 65–99)
Potassium: 4.1 mmol/L (ref 3.5–5.1)
SODIUM: 137 mmol/L (ref 135–145)

## 2017-05-10 MED ORDER — FUROSEMIDE 40 MG PO TABS: 40.0000 mg | ORAL_TABLET | Freq: Two times a day (BID) | ORAL | 0 refills | Status: DC

## 2017-05-10 MED ORDER — TAMSULOSIN HCL 0.4 MG PO CAPS: 0.4000 mg | ORAL_CAPSULE | Freq: Every day | ORAL | 0 refills | Status: DC

## 2017-05-10 MED ORDER — ENSURE ENLIVE PO LIQD: 237.0000 mL | Freq: Two times a day (BID) | ORAL | 12 refills | Status: DC

## 2017-05-10 MED ORDER — TRAMADOL HCL 50 MG PO TABS: 100.0000 mg | ORAL_TABLET | Freq: Three times a day (TID) | ORAL | 2 refills | Status: DC | PRN

## 2017-05-10 MED ORDER — NITROGLYCERIN 0.4 MG SL SUBL: 0.4000 mg | SUBLINGUAL_TABLET | SUBLINGUAL | 12 refills | Status: DC | PRN

## 2017-05-10 MED ORDER — CARVEDILOL 12.5 MG PO TABS: 12.5000 mg | ORAL_TABLET | Freq: Two times a day (BID) | ORAL | 0 refills | Status: DC

## 2017-05-10 MED ORDER — CARVEDILOL 12.5 MG PO TABS: 12.5000 mg | ORAL_TABLET | Freq: Two times a day (BID) | ORAL | Status: DC

## 2017-05-10 MED ORDER — HYDROCORTISONE 2.5 % RE CREA: TOPICAL_CREAM | Freq: Two times a day (BID) | RECTAL | 0 refills | Status: DC

## 2017-05-10 MED ORDER — HYDROCORTISONE ACE-PRAMOXINE 1-1 % RE FOAM: 1.0000 | Freq: Two times a day (BID) | RECTAL | 0 refills | Status: DC

## 2017-05-10 MED ORDER — SENNA 8.6 MG PO TABS: 1.0000 | ORAL_TABLET | Freq: Every day | ORAL | 0 refills | Status: DC

## 2017-05-10 MED ORDER — TAMSULOSIN HCL 0.4 MG PO CAPS
0.4000 mg | ORAL_CAPSULE | Freq: Every day | ORAL | Status: DC
  Administered 2017-05-10: 0.4 mg via ORAL
  Filled 2017-05-10: qty 1

## 2017-05-10 MED ORDER — APIXABAN 2.5 MG PO TABS: 2.5000 mg | ORAL_TABLET | Freq: Two times a day (BID) | ORAL | 0 refills | Status: DC

## 2017-05-10 MED ORDER — HYDRALAZINE HCL 10 MG PO TABS: 10.0000 mg | ORAL_TABLET | Freq: Three times a day (TID) | ORAL | Status: DC

## 2017-05-10 MED ORDER — WITCH HAZEL-GLYCERIN EX PADS: MEDICATED_PAD | CUTANEOUS | 12 refills | Status: DC | PRN

## 2017-05-10 NOTE — Discharge Instructions (Signed)
Information on my medicine - ELIQUIS (apixaban)  This medication education was reviewed with me or my healthcare representative as part of my discharge preparation.  The pharmacist that spoke with me during my hospital stay was:  Ramond Dial, [facility]  Why was Eliquis prescribed for you? Eliquis was prescribed for you to reduce the risk of forming blood clots.  What do You need to know about Eliquis ? Take your Eliquis TWICE DAILY - one tablet in the morning and one tablet in the evening with or without food.  It would be best to take the doses about the same time each day.  If you have difficulty swallowing the tablet whole please discuss with your pharmacist how to take the medication safely.  Take Eliquis exactly as prescribed by your doctor and DO NOT stop taking Eliquis without talking to the doctor who prescribed the medication.  Stopping may increase your risk of developing a new clot or stroke.  Refill your prescription before you run out.  After discharge, you should have regular check-up appointments with your healthcare provider that is prescribing your Eliquis.  In the future your dose may need to be changed if your kidney function or weight changes by a significant amount or as you get older.  What do you do if you miss a dose? If you miss a dose, take it as soon as you remember on the same day and resume taking twice daily.  Do not take more than one dose of ELIQUIS at the same time.  Important Safety Information A possible side effect of Eliquis is bleeding. You should call your healthcare provider right away if you experience any of the following: ? Bleeding from an injury or your nose that does not stop. ? Unusual colored urine (red or dark brown) or unusual colored stools (red or black). ? Unusual bruising for unknown reasons. ? A serious fall or if you hit your head (even if there is no bleeding).  Some medicines may interact with Eliquis and might increase your  risk of bleeding or clotting while on Eliquis. To help avoid this, consult your healthcare provider or pharmacist prior to using any new prescription or non-prescription medications, including herbals, vitamins, non-steroidal anti-inflammatory drugs (NSAIDs) and supplements.  This website has more information on Eliquis (apixaban): [email or web address].

## 2017-05-10 NOTE — Telephone Encounter (Signed)
Left detailed voicemail message per release form to call back if he should have any questions regarding discharge instructions or medications and we will see him at upcoming scheduled appointment on 05/21/17 at 09:20AM with Dr. [name] and to call back if any questions.

## 2017-05-10 NOTE — Progress Notes (Signed)
Pt not able to urinate as of 22:00. Bladder scan showed volume >400. MD Jannifer Franklin made aware, in and out cath ordered. Pt tolerated well., 600 ml voided. Will reassess in the a.m.

## 2017-05-10 NOTE — Progress Notes (Signed)
Pt provided free 30 day coupon for eliquis. Education provided about eliquis. Pt was switched from xarelto to eliquis due to kindey function  Dennis Burns D Hallelujah Wysong, Pharm.D, BCPS Clinical Pharmacist

## 2017-05-10 NOTE — Telephone Encounter (Signed)
-----   Message from Blain Pais sent at 05/10/2017 11:00 AM EST ----- Regarding: TCM/PH 3/18 9:20 DR.[name]

## 2017-05-10 NOTE — Clinical Social Work Placement (Signed)
   CLINICAL SOCIAL WORK PLACEMENT  NOTE  Date:  05/10/2017  Patient Details  Name: Dennis Burns MRN: [id number] Date of Birth: 08-11-1928  Clinical Social Work is seeking post-discharge placement for this patient at the [place] level of care (*CSW will initial, date and re-position this form in  chart as items are completed):  Yes   Patient/family provided with [place] Work Department's list of facilities offering this level of care within the geographic area requested by the patient (or if unable, by the patient's family).  Yes   Patient/family informed of their freedom to choose among providers that offer the needed level of care, that participate in Medicare, Medicaid or managed care program needed by the patient, have an available bed and are willing to accept the patient.  Yes   Patient/family informed of [facility]'s ownership interest in [facility] and [facility], as well as of the fact that they are under no obligation to receive care at these facilities.  PASRR submitted to EDS on 05/09/17     PASRR number received on       Existing PASRR number confirmed on 05/09/17     FL2 transmitted to all facilities in geographic area requested by pt/family on 05/09/17     FL2 transmitted to all facilities within larger geographic area on       Patient informed that his/her managed care company has contracts with or will negotiate with certain facilities, including the following:        Yes   Patient/family informed of bed offers received.  Patient chooses bed at [facility]     Physician recommends and patient chooses bed at      Patient to be transferred to [facility] on 05/10/17.  Patient to be transferred to facility by Family's car     Patient family notified on 05/10/17 of transfer.  Name of family member notified:  Wife Velva Harman was at bedside     PHYSICIAN Please sign FL2     Additional Comment:     _______________________________________________ Ross Ludwig, LCSWA 05/10/2017, 5:43 PM

## 2017-05-10 NOTE — Telephone Encounter (Signed)
Patient wife returning call as patient still admitted

## 2017-05-10 NOTE — Clinical Social Work Note (Signed)
CSW received phone call from patient's insurance company that patient has been approved for SNF.  CSW updated patient and his family.  Patient to be d/c'ed today to [facility] room 311.  Patient and family agreeable to plans will transport via family car RN to call report.  Evette Cristal, MSW, [place]

## 2017-05-10 NOTE — Clinical Social Work Note (Signed)
CSW faxed requested clinical information to patient's insurance company, awaiting insurance authorization.  Jones Broom. Hayden, MSW, [place]  05/10/2017 12:28 PM

## 2017-05-10 NOTE — Progress Notes (Signed)
84 year old male with hx of CAD s/p CABG, PAD, HTN, CKD III, and DVT who presented to ER with SOB and NSTEMI.    Problem List this admission Per MD:    1. Non-ST elevation MI: Troponin max was 12.08.  Patient has chronic kidney disease and as a result he is currently not a candidate for cardiac catheterization during this hospital stay. This will be deferred as an outpatient.  Patient on aspirin, statin, Coreg, isosorbide  Echocardiogram shows ejection fraction 35-40% with probable hypokinesis of the apical anterior,lateral, and apical myocardium. Probable hypokinesis of the basal-midinferolateral myocardium  Patient is not a candidate for cardiac rehabilitation until evaluated as an outpatient by cardiology with consideration of either stress test or cardiac catheterization.  2. Acute hypoxic respiratory failure due to Acute on chronic combined systolic and diastolic heart failure with ejection fraction 35-40% heart failure and pulmonary hypertension:  Patientiscurrently euvolemic.   Low-dose Hydralazine added for afterload reduction  He will continue Lasix. His weight needs to be monitored on a daily basis. Echo shows elevated right heart pressure. He is referred to CHF clinic.  (Patient and wife requesting the CHF Clinic appointment be cancelled as patient is being followed by Dr. Candiss Norse.)  He will follow-up with cardiology. 3. Hyperlipidemia: On Zetia and Lipitor 4. Hypothyroidism: On Synthroid 5. Essential hypertension: On Coreg and isosorbide 6. Acute on chronic kidney disease stage III: Nephrology followed patient this admission  For acute kidney injury in the setting of cardiorenal syndrome  Creatinine has improved and is at baseline.  Renal ultrasound reviewed, no obstruction seen. 7. History of DVT:   Continue on Eliquis as per cardiology RECS 8. Urinary retention with history of prostate cancer: Patient is started on Flomax. He will follow-up with urology in 1 week.   Patient is voiding at this time without difficulty.  9. Internal hemorrhoids  CHF Education completed with patient, wife, and family:    ? Provided patient with "Living Better with Heart Failure" packet. Briefly reviewed definition of heart failure and signs and symptoms of an exacerbation.?Discussed the meaning of EF and his preliminary value as compared to normal value.  Explained to patient that HF is a chronic illness which requires self-assessment / self-management along with help from the cardiologist/PCP/HF Clinic.?? ? *Reviewed importance of and reason behind checking weight daily in the AM, after using the bathroom, but before getting dressed.  Patient has scales.   Reviewed the following information with patient:  *Discussed when to call the Dr= weight gain of >2-3lb overnight of 5lb in a week,  *Discussed yellow zone= call MD: weight gain of >2-3lb overnight of 5lb in a week, increased swelling, increased SOB when lying down, chest discomfort, dizziness, increased fatigue *Red Zone= call 911: struggle to breath, fainting or near fainting, significant chest pain   *Patient is on a 2 gm sodium diet.  Reviewed low sodium diet-provided handout of recommended and not recommended foods.   Provided handout on the sodium content of foods.  Reviewed reading labels with patient. Discussed fluid intake with patient as well. Patient not currently on a fluid restriction, but advised no more than 8-8 ounces glass of fluids per day.    *Instructed patient to take medications as prescribed for heart failure. Explained briefly why pt is on the medications (either make you feel better, live longer or keep you out of the hospital) and discussed monitoring and side effects.   *Smoking Cessation - Patient is a former smoker. ? *Exercise -  Patient being discharged to [facility] for STR.  ? *[facility] Failure [facility] - Patient and wife requested this RN cancel the appointment in the [facility] as  the patient is being followed by Dr. [name], Cardiology, and Dr. Candiss Norse, Nephrology due to his CKD.     Again, the 5 Steps to Living Better with Heart Failure reviewed with patient and family.   . ? Roanna Epley, RN, BSN, [facility] Pulmonary Nurse Navigator

## 2017-05-10 NOTE — Discharge Summary (Signed)
[facility] NAME: Dennis Burns    MR#:  [id number]  DATE OF BIRTH:  August 17, 1928  DATE OF ADMISSION:  04/30/2017 ADMITTING PHYSICIAN: Amelia Jo, MD  DATE OF DISCHARGE: 05/10/2017  PRIMARY CARE PHYSICIAN: [name], MD    ADMISSION DIAGNOSIS:  ARF (acute renal failure) (HCC) [N17.9] NSTEMI (non-ST elevated myocardial infarction) ([place]) [I21.4] Acute respiratory failure with hypoxia (HCC) [J96.01] Acute kidney injury superimposed on chronic kidney disease ([place]) [N17.9, N18.9] Acute on chronic congestive heart failure, unspecified heart failure type ([place]) [I50.9]  DISCHARGE DIAGNOSIS:  Active Problems:   NSTEMI (non-ST elevated myocardial infarction) ([place])   Acute on chronic congestive heart failure ([place])   SECONDARY DIAGNOSIS:   Past Medical History:  Diagnosis Date  . Allergic rhinitis due to pollen    as child--better now  . Chronic kidney disease, stage III (moderate) (HCC)   . Coronary artery disease   . DVT, lower extremity, recurrent ([place])   . Glaucoma    [facility]   . History of prostate cancer 2004  . Hyperlipidemia   . Hypertension   . Hypothyroidism   . Impaired fasting glucose   . Spinal stenosis of lumbar region with radiculopathy     HOSPITAL COURSE:   84 year old male with history of CAD status post CABG, PAD, hypertension , chronic kidney disease stage III and DVT on anticoagulation who presented due to shortness of breath.  1. Non-ST elevation MI: Troponin max was 12.08 Due to acute on chronic kidney disease he is currently not a candidate for cardiac catheterization during this hospital stay. This will be deferred as an outpatient.  Continue aspirin,  statin, Coreg, isosorbide  Cardiology consultation appreciated Echocardiogram shows ejection fraction 35-40% with probable hypokinesis of the apical anterior,lateral, and apical myocardium. Probable hypokinesis of  the basal-midinferolateral myocardium Patient not a candidate for cardiac rehabilitation until evaluated as an outpatient by cardiology with consideration of either stress test or cardiac catheterization.  2. Acute hypoxic respiratory failure due to Acute on chronic combined systolic and diastolic heart failure with ejection fraction 35-40%  heart failure and pulmonary hypertension:  Patient was weaned off oxygen. Patient is  currently euvolemic.  Milrinone had been used for inotropic support and this has been discontinued for the past 2 days.   Low-dose Hydralazine added for afterload reduction  He will continue Lasix. His weight needs to be monitored on a daily basis. Echo shows elevated right heart pressure. He is referred to CHF clinic. He will follow-up with cardiology.  3. Hyperlipidemia: Continues Zetia and Lipitor  4. Hypothyroidism: Continue Synthroid  5. Essential hypertension: Continue Coreg  and isosorbide 6. Acute on chronic kidney disease stage III: Nephrology consultation appreciated acute kidney injury in the setting of cardiorenal syndrome  Creatinine has improved and is at baseline.  Renal ultrasound reviewed, no obstruction seen.   7. History of DVT:  Continue on Eliquis as per cardiology RECS  8. Urinary retention with history of prostate cancer: Patient is started on Flomax. He will follow-up with urology in 1 week.  9. Internal hemorrhoids: At some point patient would benefit from GI consultation but not  DISCHARGE CONDITIONS AND DIET:   Stable for discharge on heart healthy diet  CONSULTS OBTAINED:  Treatment Team:  Wellington Hampshire, MD Lavonia Dana, MD  DRUG ALLERGIES:  No Known Allergies  DISCHARGE MEDICATIONS:   Allergies as of 05/10/2017   No Known Allergies  Medication List    STOP taking these medications   NIFEdipine 60 MG 24 hr tablet Commonly known as:  PROCARDIA-XL/ADALAT CC   Rivaroxaban 15 MG Tabs  tablet Commonly known as:  XARELTO   valsartan 80 MG tablet Commonly known as:  DIOVAN     TAKE these medications   Alpha-Lipoic Acid 200 MG Caps Take 1 capsule by mouth daily.   apixaban 2.5 MG Tabs tablet Commonly known as:  ELIQUIS Take 1 tablet (2.5 mg total) by mouth 2 (two) times daily.   atorvastatin 80 MG tablet Commonly known as:  LIPITOR Take 1 tablet (80 mg total) by mouth daily.   CALCIUM CITRATE PO Take 1 tablet by mouth daily. Takes 300mg  daily.   carvedilol 12.5 MG tablet Commonly known as:  COREG Take 1 tablet (12.5 mg total) by mouth 2 (two) times daily with a meal. What changed:    medication strength  how much to take  when to take this   Co Q10 200 MG Caps Take 1 capsule by mouth daily.   CRANBERRY EXTRACT PO Take 1 tablet by mouth daily. Takes 1500mg  daily   ezetimibe 10 MG tablet Commonly known as:  ZETIA Take 1 tablet (10 mg total) by mouth daily.   feeding supplement (ENSURE ENLIVE) Liqd Take 237 mLs by mouth 2 (two) times daily between meals.   furosemide 40 MG tablet Commonly known as:  LASIX Take 1 tablet (40 mg total) by mouth 2 (two) times daily. What changed:  when to take this   hydrALAZINE 10 MG tablet Commonly known as:  APRESOLINE Take 1 tablet (10 mg total) by mouth every 8 (eight) hours.   hydrocortisone 2.5 % rectal cream Commonly known as:  ANUSOL-HC Place rectally 2 (two) times daily.   hydrocortisone-pramoxine rectal foam Commonly known as:  PROCTOFOAM-HC Place 1 applicator rectally 2 (two) times daily.   isosorbide mononitrate 30 MG 24 hr tablet Commonly known as:  IMDUR Take 2 tablets (60 mg total) by mouth daily.   latanoprost 0.005 % ophthalmic solution Commonly known as:  XALATAN Place 1 drop into both eyes at bedtime. In morning   levothyroxine 75 MCG tablet Commonly known as:  SYNTHROID, LEVOTHROID Take 1 tablet (75 mcg total) by mouth daily.   multivitamin tablet Take 1 tablet by mouth daily.    nitroGLYCERIN 0.4 MG SL tablet Commonly known as:  NITROSTAT Place 1 tablet (0.4 mg total) under the tongue every 5 (five) minutes as needed for chest pain.   OMEGA-3-6-9 PO Take 1 capsule by mouth 2 (two) times daily.   senna 8.6 MG Tabs tablet Commonly known as:  SENOKOT Take 1 tablet (8.6 mg total) by mouth daily. Start taking on:  05/11/2017   tamsulosin 0.4 MG Caps capsule Commonly known as:  FLOMAX Take 1 capsule (0.4 mg total) by mouth daily.   timolol 0.25 % ophthalmic solution Commonly known as:  TIMOPTIC Place 1 drop into both eyes 2 (two) times daily. am   traMADol 50 MG tablet Commonly known as:  ULTRAM Take 2 tablets (100 mg total) by mouth 3 (three) times daily as needed.   Vitamin D 2000 units Caps Take 4,000 Units by mouth daily.   witch hazel-glycerin pad Commonly known as:  TUCKS Apply topically as needed for itching.         Today   CHIEF COMPLAINT:   Patient had some trouble urinating last night. Family is at bedside.   VITAL SIGNS:  Blood pressure 137/66,  pulse 97, temperature (!) 97.5 F (36.4 C), temperature source Oral, resp. rate 20, height 5\' 3"  (1.6 m), weight 58.9 kg (129 lb 14.4 oz), SpO2 93 %.   REVIEW OF SYSTEMS:  Review of Systems  Constitutional: Negative.  Negative for chills, fever and malaise/fatigue.  HENT: Negative.  Negative for ear discharge, ear pain, hearing loss, nosebleeds and sore throat.   Eyes: Negative.  Negative for blurred vision and pain.  Respiratory: Negative.  Negative for cough, hemoptysis, shortness of breath and wheezing.   Cardiovascular: Negative.  Negative for chest pain, palpitations and leg swelling.  Gastrointestinal: Negative.  Negative for abdominal pain, blood in stool, diarrhea, nausea and vomiting.  Genitourinary: Negative.  Negative for dysuria.  Musculoskeletal: Negative.  Negative for back pain.  Skin: Negative.   Neurological: Negative for dizziness, tremors, speech change, focal  weakness, seizures and headaches.  Endo/Heme/Allergies: Negative.  Does not bruise/bleed easily.  Psychiatric/Behavioral: Negative.  Negative for depression, hallucinations and suicidal ideas.     PHYSICAL EXAMINATION:  GENERAL:  84 y.o.-year-old patient lying in the bed with no acute distress.  NECK:  Supple, no jugular venous distention. No thyroid enlargement, no tenderness.  LUNGS: Normal breath sounds bilaterally, no wheezing, rales,rhonchi  No use of accessory muscles of respiration.  CARDIOVASCULAR: S1, S2 normal. 2/6 murmurs, no rubs, or gallops.  ABDOMEN: Soft, non-tender, non-distended. Bowel sounds present. No organomegaly or mass.  EXTREMITIES: No pedal edema, cyanosis, or clubbing.  PSYCHIATRIC: The patient is alert and oriented x 3.  SKIN: No obvious rash, lesion, or ulcer.   DATA REVIEW:   CBC Recent Labs  Lab 05/08/17 0405  WBC 9.8  HGB 11.0*  HCT 33.3*  PLT 402    Chemistries  Recent Labs  Lab 05/07/17 0504  05/10/17 0305  NA 136   < > 137  K 3.5   < > 4.1  CL 95*   < > 96*  CO2 28   < > 30  GLUCOSE 120*   < > 136*  BUN 83*   < > 74*  CREATININE 1.79*   < > 1.60*  CALCIUM 8.8*   < > 9.0  MG 2.1  --   --    < > = values in this interval not displayed.    Cardiac Enzymes Recent Labs  Lab 05/06/17 0305  TROPONINI 6.27*    Microbiology Results  @MICRORSLT48 @  RADIOLOGY:  No results found.    Allergies as of 05/10/2017   No Known Allergies     Medication List    STOP taking these medications   NIFEdipine 60 MG 24 hr tablet Commonly known as:  PROCARDIA-XL/ADALAT CC   Rivaroxaban 15 MG Tabs tablet Commonly known as:  XARELTO   valsartan 80 MG tablet Commonly known as:  DIOVAN     TAKE these medications   Alpha-Lipoic Acid 200 MG Caps Take 1 capsule by mouth daily.   apixaban 2.5 MG Tabs tablet Commonly known as:  ELIQUIS Take 1 tablet (2.5 mg total) by mouth 2 (two) times daily.   atorvastatin 80 MG tablet Commonly known  as:  LIPITOR Take 1 tablet (80 mg total) by mouth daily.   CALCIUM CITRATE PO Take 1 tablet by mouth daily. Takes 300mg  daily.   carvedilol 12.5 MG tablet Commonly known as:  COREG Take 1 tablet (12.5 mg total) by mouth 2 (two) times daily with a meal. What changed:    medication strength  how much to take  when to take  this   Co Q10 200 MG Caps Take 1 capsule by mouth daily.   CRANBERRY EXTRACT PO Take 1 tablet by mouth daily. Takes 1500mg  daily   ezetimibe 10 MG tablet Commonly known as:  ZETIA Take 1 tablet (10 mg total) by mouth daily.   feeding supplement (ENSURE ENLIVE) Liqd Take 237 mLs by mouth 2 (two) times daily between meals.   furosemide 40 MG tablet Commonly known as:  LASIX Take 1 tablet (40 mg total) by mouth 2 (two) times daily. What changed:  when to take this   hydrALAZINE 10 MG tablet Commonly known as:  APRESOLINE Take 1 tablet (10 mg total) by mouth every 8 (eight) hours.   hydrocortisone 2.5 % rectal cream Commonly known as:  ANUSOL-HC Place rectally 2 (two) times daily.   hydrocortisone-pramoxine rectal foam Commonly known as:  PROCTOFOAM-HC Place 1 applicator rectally 2 (two) times daily.   isosorbide mononitrate 30 MG 24 hr tablet Commonly known as:  IMDUR Take 2 tablets (60 mg total) by mouth daily.   latanoprost 0.005 % ophthalmic solution Commonly known as:  XALATAN Place 1 drop into both eyes at bedtime. In morning   levothyroxine 75 MCG tablet Commonly known as:  SYNTHROID, LEVOTHROID Take 1 tablet (75 mcg total) by mouth daily.   multivitamin tablet Take 1 tablet by mouth daily.   nitroGLYCERIN 0.4 MG SL tablet Commonly known as:  NITROSTAT Place 1 tablet (0.4 mg total) under the tongue every 5 (five) minutes as needed for chest pain.   OMEGA-3-6-9 PO Take 1 capsule by mouth 2 (two) times daily.   senna 8.6 MG Tabs tablet Commonly known as:  SENOKOT Take 1 tablet (8.6 mg total) by mouth daily. Start taking on:   05/11/2017   tamsulosin 0.4 MG Caps capsule Commonly known as:  FLOMAX Take 1 capsule (0.4 mg total) by mouth daily.   timolol 0.25 % ophthalmic solution Commonly known as:  TIMOPTIC Place 1 drop into both eyes 2 (two) times daily. am   traMADol 50 MG tablet Commonly known as:  ULTRAM Take 2 tablets (100 mg total) by mouth 3 (three) times daily as needed.   Vitamin D 2000 units Caps Take 4,000 Units by mouth daily.   witch hazel-glycerin pad Commonly known as:  TUCKS Apply topically as needed for itching.          Management plans discussed with the patient and he is in agreement. Stable for discharge snf  Patient should follow up with cardiology  CODE STATUS:     Code Status Orders  (From admission, onward)        Start     Ordered   05/07/17 1821  Full code  Continuous     05/07/17 1820    Code Status History    Date Active Date Inactive Code Status Order ID Comments User Context   05/04/2017 14:03 05/07/2017 18:20 DNR [id number]  Asencion Gowda, NP Inpatient   04/30/2017 20:28 05/04/2017 14:03 Full Code [id number]  Gorden Harms, MD Inpatient   02/14/2017 16:45 02/15/2017 15:07 Full Code [id number]  Wellington Hampshire, MD Inpatient    Advance Directive Documentation     Most Recent Value  Type of Advance Directive  Healthcare Power of Attorney, Living will  Pre-existing out of facility DNR order (yellow form or pink MOST form)  No data  "MOST" Form in Place?  No data      TOTAL TIME TAKING CARE OF THIS PATIENT: 38 minutes.  Note: This dictation was prepared with Dragon dictation along with smaller phrase technology. Any transcriptional errors that result from this process are unintentional.  Matricia Begnaud M.D on 05/10/2017 at 10:19 AM  Between 7am to 6pm - Pager - [phone] After 6pm go to [email or web address] - password [email or web address] [facility]  Office  [phone]  CC: Primary care physician; [name], MD

## 2017-05-10 NOTE — Progress Notes (Signed)
Rounded on patient.  Patient appeared to be sleeping.  Room dark.  Wife sitting in a chair next to the bed.  Patient's wife requested I return in the afternoon, as patient is just so exhausted and needs to rest if at all possible.  Will return this afternoon to speak to patient.    Roanna Epley, RN, BSN, [facility] Cardiovascular and Pulmonary Nurse Navigator

## 2017-05-10 NOTE — NC FL2 (Signed)
[place] LEVEL OF CARE SCREENING TOOL     IDENTIFICATION  Patient Name: Dennis Burns Birthdate: 04-07-1928 Sex: male Admission Date (Current Location): 04/30/2017  [place] and [place] Number:      Facility and Address:  [facility], [address] [place], [place] [address]      Provider Number: [id number]  Attending Physician Name and Address:  Bettey Costa, MD  Relative Name and Phone Number:  [place], [place] [phone]     Current Level of Care: SNF Recommended Level of Care: [place] Prior Approval Number:    Date Approved/Denied:   PASRR Number: [id number]  Discharge Plan: SNF    Current Diagnoses: Patient Active Problem List   Diagnosis Date Noted  . Acute on chronic congestive heart failure ([place])   . NSTEMI (non-ST elevated myocardial infarction) ([place]) 04/30/2017  . Effort angina ([place]) 02/14/2017  . Positive cardiac stress test 02/04/2017  . Preventative health care 05/03/2016  . Pulmonary hypertension ([place]) 05/03/2016  . PAD (peripheral artery disease) ([place]) 04/18/2016  . DOE (dyspnea on exertion) 11/02/2015  . Spinal stenosis of lumbar region with radiculopathy   . Coronary artery disease   . History of prostate cancer   . Hyperlipidemia   . Hypertension   . Chronic kidney disease, stage III (moderate) (HCC)   . DVT, lower extremity, recurrent ([place])   . Glaucoma   . Hypothyroidism   . Impaired fasting glucose     Orientation RESPIRATION BLADDER Height & Weight     Self, Time, Situation, Place  Normal Continent Weight: 129 lb 14.4 oz (58.9 kg) Height:  5\' 3"  (160 cm)  BEHAVIORAL SYMPTOMS/MOOD NEUROLOGICAL BOWEL NUTRITION STATUS      Continent Diet(2 g sodium diet)  AMBULATORY STATUS COMMUNICATION OF NEEDS Skin   Limited Assist Verbally Normal                       Personal Care Assistance Level of Assistance  Bathing, Feeding, Dressing Bathing Assistance: Limited  assistance Feeding assistance: Independent Dressing Assistance: Limited assistance     Functional Limitations Info  Speech, Hearing, Sight Sight Info: Adequate Hearing Info: Adequate Speech Info: Adequate    SPECIAL CARE FACTORS FREQUENCY  PT (By licensed PT)     PT Frequency: 5x a week              Contractures Contractures Info: Not present    Additional Factors Info  Code Status, Allergies Code Status Info: Full Code Allergies Info: NKA           Current Medications (05/10/2017):  This is the current hospital active medication list Current Facility-Administered Medications  Medication Dose Route Frequency Provider Last Rate Last Dose  . acetaminophen (TYLENOL) tablet 650 mg  650 mg Oral Q4H PRN Salary, Holly Bodily D, MD   650 mg at 05/08/17 1722  . ALPRAZolam Duanne Moron) tablet 0.25 mg  0.25 mg Oral BID PRN Loney Hering D, MD   0.25 mg at 05/10/17 0811  . apixaban (ELIQUIS) tablet 2.5 mg  2.5 mg Oral BID [name] A, MD   2.5 mg at 05/10/17 0623  . aspirin EC tablet 81 mg  81 mg Oral Daily Salary, Montell D, MD   81 mg at 05/10/17 0814  . atorvastatin (LIPITOR) tablet 80 mg  80 mg Oral Daily Salary, Holly Bodily D, MD   80 mg at 05/09/17 2106  . calcium citrate (CALCITRATE - dosed in mg elemental calcium) tablet 200 mg  of elemental calcium  200 mg of elemental calcium Oral Daily Salary, Montell D, MD   200 mg of elemental calcium at 05/10/17 0815  . carvedilol (COREG) tablet 6.25 mg  6.25 mg Oral BID WC Minna Merritts, MD   6.25 mg at 05/10/17 0814  . cholecalciferol (VITAMIN D) tablet 4,000 Units  4,000 Units Oral Daily Salary, Montell D, MD   4,000 Units at 05/09/17 1049  . docusate sodium (COLACE) capsule 100 mg  100 mg Oral BID Wilhelmina Mcardle, MD   100 mg at 05/10/17 0813  . ezetimibe (ZETIA) tablet 10 mg  10 mg Oral Daily Salary, Montell D, MD   10 mg at 05/10/17 8119  . feeding supplement (ENSURE ENLIVE) (ENSURE ENLIVE) liquid 237 mL  237 mL Oral BID BM Kasa,  Kurian, MD   237 mL at 05/08/17 1511  . furosemide (LASIX) tablet 40 mg  40 mg Oral BID Christell Faith M, PA-C   40 mg at 05/10/17 0813  . hydrALAZINE (APRESOLINE) tablet 10 mg  10 mg Oral Q8H Dunn, Ryan M, PA-C   10 mg at 05/10/17 1478  . hydrocortisone (ANUSOL-HC) 2.5 % rectal cream   Rectal BID Bettey Costa, MD      . hydrocortisone-pramoxine (PROCTOFOAM-HC) rectal foam 1 applicator  1 applicator Rectal BID Flora Lipps, MD   1 applicator at 29/56/21 3086  . ipratropium-albuterol (DUONEB) 0.5-2.5 (3) MG/3ML nebulizer solution 3 mL  3 mL Nebulization Q4H PRN Salary, Montell D, MD   3 mL at 05/03/17 0756  . isosorbide mononitrate (IMDUR) 24 hr tablet 60 mg  60 mg Oral Daily Salary, Montell D, MD   60 mg at 05/10/17 0811  . latanoprost (XALATAN) 0.005 % ophthalmic solution 1 drop  1 drop Both Eyes QHS Salary, Montell D, MD   1 drop at 05/09/17 2107  . levothyroxine (SYNTHROID, LEVOTHROID) tablet 75 mcg  75 mcg Oral QAC breakfast Loney Hering D, MD   75 mcg at 05/10/17 0811  . morphine 2 MG/ML injection 2 mg  2 mg Intravenous Q1H PRN Flora Lipps, MD   2 mg at 05/09/17 2226  . multivitamin with minerals tablet 1 tablet  1 tablet Oral Daily Salary, Avel Peace, MD   1 tablet at 05/10/17 0811  . nitroGLYCERIN (NITROSTAT) SL tablet 0.4 mg  0.4 mg Sublingual Q5 min PRN Salary, Montell D, MD      . nitroGLYCERIN (NITROSTAT) SL tablet 0.4 mg  0.4 mg Sublingual Q5 Min x 3 PRN Salary, Montell D, MD      . omega-3 acid ethyl esters (LOVAZA) capsule   Oral BID Loney Hering D, MD   1 g at 05/08/17 2107  . ondansetron (ZOFRAN) injection 4 mg  4 mg Intravenous Q6H PRN Salary, Montell D, MD      . phenol (CHLORASEPTIC) mouth spray 1 spray  1 spray Mouth/Throat PRN Saundra Shelling, MD   1 spray at 05/03/17 2015  . senna (SENOKOT) tablet 8.6 mg  1 tablet Oral Daily Wilhelmina Mcardle, MD   8.6 mg at 05/09/17 1038  . sodium chloride flush (NS) 0.9 % injection 3 mL  3 mL Intravenous Q12H Bettey Costa, MD   3 mL at 05/10/17  0817  . timolol (TIMOPTIC) 0.25 % ophthalmic solution 1 drop  1 drop Both Eyes BID Salary, Holly Bodily D, MD   1 drop at 05/10/17 0816  . traMADol (ULTRAM) tablet 50 mg  50 mg Oral Q12H PRN Lance Coon, MD  50 mg at 05/09/17 2107  . valACYclovir (VALTREX) tablet 1,000 mg  1,000 mg Oral Daily Flora Lipps, MD   1,000 mg at 05/10/17 0816  . witch hazel-glycerin (TUCKS) pad   Topical PRN Bettey Costa, MD         Discharge Medications: Please see discharge summary for a list of discharge medications.  Relevant Imaging Results:  Relevant Lab Results:   Additional Information SSN [id number]  Ross Ludwig, [place]

## 2017-05-10 NOTE — Clinical Social Work Note (Signed)
Clinical Social Work Assessment  Patient Details  Name: Dennis Burns MRN: [id number] Date of Birth: 1929-02-08  Date of referral:  05/10/17               Reason for consult:  Facility Placement                Permission sought to share information with:  Family Supports, Customer service manager Permission granted to share information::  Yes, Verbal Permission Granted  Name::     [place], [place] [phone]   Agency::  SNF admission  Relationship::     Contact Information:     Housing/Transportation Living arrangements for the past 2 months:  Independent Living Facility([facility]) Source of Information:  Patient, Adult Children, Spouse Patient Interpreter Needed:  None Criminal Activity/Legal Involvement Pertinent to Current Situation/Hospitalization:  No - Comment as needed Significant Relationships:  Adult Children, Spouse, Other Family Members Lives with:  Facility Resident Do you feel safe going back to the place where you live?  No Need for family participation in patient care:  No (Coment)  Care giving concerns:  Patient feels that he needs some short term rehab before he is able to return to [place] at [facility].   Social Worker assessment / plan: Patient is an 84 year old male who is alert and oriented x4.  Patient states he has not been to rehab before, CSW expalained to him process of finding placement and what to expect at SNF.  Patient was also explained role of CSW, and what to expect day of discharge.  Patient and his family were explained how insurance will pay for stay.  Patient and family did not express any other questions or concerns about going to short term rehab.  CSW was given permission to begin bed search at [facility].  Employment status:  Retired Nurse, adult PT Recommendations:  [place] / Referral to community resources:  [place]  Patient/Family's  Response to care:  Patient in agreement to going to SNF at [facility].  Patient/Family's Understanding of and Emotional Response to Diagnosis, Current Treatment, and Prognosis:  Patient is motivated to return back home, and is looking forward to discharging from hospital to SNF.  Emotional Assessment Appearance:  Appears stated age Attitude/Demeanor/Rapport:    Affect (typically observed):  Appropriate, Calm, Stable Orientation:  Oriented to Self, Oriented to Place, Oriented to  Time, Oriented to Situation Alcohol / Substance use:  Not Applicable Psych involvement (Current and /or in the community):  No (Comment)  Discharge Needs  Concerns to be addressed:  Care Coordination, Lack of Support Readmission within the last 30 days:  No Current discharge risk:  Lack of support system Barriers to Discharge:  Ship broker, Continued Medical Work up   Anell Barr 05/10/2017, 5:38 PM

## 2017-05-10 NOTE — Progress Notes (Signed)
Progress Note  Patient Name: Dennis Burns Date of Encounter: 05/10/2017  Primary Cardiologist: [name]  Subjective   No chest pain.  Significant improvement in shortness of breath.  His biggest complaint seems to be significant discomfort related to hemorrhoids.  He is not able to sleep well at night.  He walked with PT yesterday and became tachycardic. Telemetry shows intermittent sinus tachycardia with short runs of SVT and frequent PVCs.  Inpatient Medications    Scheduled Meds: . apixaban  2.5 mg Oral BID  . aspirin EC  81 mg Oral Daily  . atorvastatin  80 mg Oral Daily  . calcium citrate  200 mg of elemental calcium Oral Daily  . carvedilol  12.5 mg Oral BID WC  . cholecalciferol  4,000 Units Oral Daily  . docusate sodium  100 mg Oral BID  . ezetimibe  10 mg Oral Daily  . feeding supplement (ENSURE ENLIVE)  237 mL Oral BID BM  . furosemide  40 mg Oral BID  . hydrALAZINE  10 mg Oral Q8H  . hydrocortisone   Rectal BID  . hydrocortisone-pramoxine  1 applicator Rectal BID  . isosorbide mononitrate  60 mg Oral Daily  . latanoprost  1 drop Both Eyes QHS  . levothyroxine  75 mcg Oral QAC breakfast  . multivitamin with minerals  1 tablet Oral Daily  . omega-3 acid ethyl esters   Oral BID  . senna  1 tablet Oral Daily  . sodium chloride flush  3 mL Intravenous Q12H  . timolol  1 drop Both Eyes BID  . valACYclovir  1,000 mg Oral Daily   Continuous Infusions:  PRN Meds: acetaminophen, ALPRAZolam, ipratropium-albuterol, morphine injection, nitroGLYCERIN, nitroGLYCERIN, ondansetron (ZOFRAN) IV, phenol, traMADol, witch hazel-glycerin   Vital Signs    Vitals:   05/09/17 1559 05/09/17 1952 05/10/17 0418 05/10/17 0800  BP:  (!) 118/56 (!) 148/62 137/66  Pulse: 82 79 82 97  Resp:  17 18 20   Temp:  98.3 F (36.8 C) 97.8 F (36.6 C) (!) 97.5 F (36.4 C)  TempSrc:  Oral Oral Oral  SpO2: 97% 92% 92% 93%  Weight:   129 lb 14.4 oz (58.9 kg)   Height:        Intake/Output  Summary (Last 24 hours) at 05/10/2017 0926 Last data filed at 05/10/2017 0100 Gross per 24 hour  Intake 1013 ml  Output 1350 ml  Net -337 ml   Filed Weights   05/08/17 1430 05/09/17 0343 05/10/17 0418  Weight: 129 lb 3.2 oz (58.6 kg) 130 lb 3.2 oz (59.1 kg) 129 lb 14.4 oz (58.9 kg)    Telemetry    Normal sinus rhythm with sinus tachycardia, short runs of SVT and frequent PVCs.- Personally Reviewed  ECG    n/a - Personally Reviewed  Physical Exam   GEN: Frail and elderly appearing; No acute distress.   Neck: No JVD. Cardiac: RRR, III/VI systolic murmur RUSB, no rubs, or gallops.  Respiratory: Clear to auscultation bilaterally.  GI: Soft, nontender, non-distended.   MS: No edema; No deformity. Neuro:  Alert and oriented x 3; Nonfocal.  Psych: Normal affect.  Labs    Chemistry Recent Labs  Lab 05/07/17 0504  05/08/17 0405 05/09/17 0321 05/10/17 0305  NA 136   < > 137 137 137  K 3.5   < > 3.8 4.0 4.1  CL 95*   < > 96* 96* 96*  CO2 28   < > 29 29 30   GLUCOSE 120*   < >  118* 132* 136*  BUN 83*   < > 73* 79* 74*  CREATININE 1.79*   < > 1.57* 1.51* 1.60*  CALCIUM 8.8*   < > 8.9 8.8* 9.0  ALBUMIN 2.5*  --   --   --   --   GFRNONAA 33*   < > 38* 40* 37*  GFRAA 38*   < > 44* 46* 43*  ANIONGAP 13   < > 12 12 11    < > = values in this interval not displayed.     Hematology Recent Labs  Lab 05/06/17 0305 05/07/17 1303 05/08/17 0405  WBC 9.1 10.2 9.8  RBC 3.53* 3.73* 3.69*  HGB 10.6* 10.9* 11.0*  HCT 31.6* 33.7* 33.3*  MCV 89.5 90.3 90.2  MCH 29.9 29.3 29.9  MCHC 33.4 32.5 33.1  RDW 14.7* 14.6* 14.6*  PLT 373 406 402    Cardiac Enzymes Recent Labs  Lab 05/06/17 0305  TROPONINI 6.27*   No results for input(s): TROPIPOC in the last 168 hours.   BNP Recent Labs  Lab 05/05/17 0508 05/07/17 0504  BNP 2,914.0* 3,671.0*     DDimer No results for input(s): DDIMER in the last 168 hours.   Radiology    No results found.  Cardiac Studies   TTE  05/01/2017:  Study Conclusions  - Left ventricle: The cavity size was normal. Wall thickness was increased in a pattern of mild LVH. Systolic function was moderately reduced. The estimated ejection fraction was in the range of 35% to 40%. Probable hypokinesis of the apicalanterior, lateral, and apical myocardium. Probable hypokinesis of the basal-midinferolateral myocardium. Doppler parameters are consistent with restrictive physiology, indicative of decreased left ventricular diastolic compliance and/or increased left atrial pressure. Doppler parameters are consistent with high ventricular filling pressure. - Aortic valve: Valve mobility was restricted. Transvalvular velocity was increased. There was likely moderate stenosis; degree of stenosis/transvavular gradient may be underestimated by low LVEF. There was moderate regurgitation. - Mitral valve: Calcified annulus. Mildly thickened leaflets . There was moderate to severe regurgitation. - Left atrium: The atrium was mildly dilated. - Right ventricle: Systolic function was mildly reduced. - Tricuspid valve: There was moderate regurgitation. - Pulmonary arteries: Systolic pressure was severely increased, in the range of 70 mm Hg to 75 mm Hg. - Pericardium, extracardiac: There was a left pleural effusion.   Patient Profile     84 y.o. male with history of CAD s/p CABG in 2003, PAH, mod AS/AI, carotid dzs s/p R CEA, PAD, HTN, HL, DVT on chronic xarelto, CKD III, prostate CA, sciatica, and spinal stenosis, who was admitted from the office on 2/25 and found to have significant volume overload, AKI, and NSTEMI.  Assessment & Plan    1. NSTEMI: -Chest pain free -Medically managed given his age and acute on CKD stage IV -Continue low-dose aspirin. Outpatient cardiac catheterization can be considered depending on his progress.  2. Acute on chronic combined CHF/pulmonary hypertension: -. He appears to be  euvolemic and close to his baseline weight Currently on furosemide 40 mg by mouth twice daily.  This should be his discharge dose. I elected to increase carvedilol to 12.5 mg twice daily considering his short runs of SVT, frequent PVCs and sinus tachycardia. The patient received milrinone during this admission and he is at high risk for worsening in the near future. Will need close follow-up with [place] in clinic after 1 week of discharge.  3. Valvular heart disease: -Moderate aortic stenosis with moderate aortic insufficiency and moderate  to severe mitral regurgitation -Likely contributing to his CHF -Unlikely to be a candidate for invasive procedures  4. Acute on CKD stage IV: -Renal function is close to baseline.  5. HTN: -BP well controlled  6. History of DVT: -Continue low-dose Eliquis.  7. NSVT/SVT/PACs/PVCs: -I increased Coreg.  Possible discharge to rehab.  Will arrange for follow-up in our office in 1 week.   For questions or updates, please contact [place] Please consult [email or web address] for contact info under Cardiology/STEMI.    Signed, [name], MD [facility] 05/10/2017, 9:26 AM

## 2017-05-10 NOTE — Telephone Encounter (Signed)
Left voicemail message to call back  

## 2017-05-11 NOTE — Telephone Encounter (Signed)
Patient wife Velva Harman returning call

## 2017-05-11 NOTE — Telephone Encounter (Signed)
I left a message for the patient/ his wife to call.

## 2017-05-14 NOTE — Telephone Encounter (Signed)
No answer. Left message to call back.   

## 2017-05-14 NOTE — Telephone Encounter (Signed)
Patient contacted regarding discharge from [facility] on 05/10/17.  Patient understands to follow up with provider Dr. [name] on 05/21/17 at 09:20 AM at [facility]. Patient understands discharge instructions? Yes Patient understands medications and regiment? Yes Patient understands to bring all medications to this visit? Yes  Spoke with patients wife and she denied any questions at this time. She verbalized understanding of our conversation, confirmed appointment and had no further questions at this time.

## 2017-05-15 DIAGNOSIS — I272 Pulmonary hypertension, unspecified: Secondary | ICD-10-CM | POA: Diagnosis not present

## 2017-05-15 DIAGNOSIS — I25119 Atherosclerotic heart disease of native coronary artery with unspecified angina pectoris: Secondary | ICD-10-CM | POA: Diagnosis not present

## 2017-05-15 DIAGNOSIS — I5042 Chronic combined systolic (congestive) and diastolic (congestive) heart failure: Secondary | ICD-10-CM | POA: Diagnosis not present

## 2017-05-15 DIAGNOSIS — N184 Chronic kidney disease, stage 4 (severe): Secondary | ICD-10-CM | POA: Diagnosis not present

## 2017-05-17 ENCOUNTER — Inpatient Hospital Stay: Payer: Medicare PPO | Admitting: Internal Medicine

## 2017-05-17 ENCOUNTER — Telehealth: Payer: Self-pay | Admitting: *Deleted

## 2017-05-17 ENCOUNTER — Ambulatory Visit: Payer: Self-pay

## 2017-05-17 MED ORDER — HYDRALAZINE HCL 10 MG PO TABS: 10.0000 mg | ORAL_TABLET | Freq: Three times a day (TID) | ORAL | 11 refills | Status: DC

## 2017-05-17 NOTE — Telephone Encounter (Signed)
Spoke to wife We discontinued the tamsulosin (only had brief urinary problems in hospital) Needs the hydralazine since other meds for BP stopped due to AKI therems is just a vitamin--he takes something else

## 2017-05-17 NOTE — Telephone Encounter (Signed)
Copied from [place] [phone]. Topic: General - Other >> May 17, 2017  2:37 PM Yvette Rack wrote: Reason for CRM: patient wife calling stating that she has questions about medicines that [facility] gave pt medicines are  tamsulosin (FLOMAX) 0.4 MG CAPS capsule   hydrALAZINE (APRESOLINE) 10 MG tablet and  Therems if any of these medicines was taking the place of any medicines that DR Silvio Pate put patient on please call asap because one of the medicines he take at night

## 2017-05-18 ENCOUNTER — Ambulatory Visit: Payer: Self-pay

## 2017-05-19 NOTE — Progress Notes (Signed)
Cardiology Office Note  Date:  05/21/2017   ID:  Dennis Burns, DOB 1929-02-14, MRN [id number]  PCP:  Dennis Carbon, MD   Chief Complaint  Patient presents with  . other    Follow up from [facility]; CHF & acute respiratory failure. Meds reviewed by the pt. Pt. c/o not able to sleep and loss of voice.     HPI:  Dennis Burns is a pleasant 84 year old gentleman with history of  Chronic shortness of breath  mild aortic valve stenosis/moderate MR/moderate pulmonary hypertension,  PAD, s/p CEA on the right,  Duplex showed significant bilateral common iliac artery stenosis as well as severe left SFA stenosis. Hyperlipidemia,  hypertension,  coronary artery disease, CABG x 4 in 2003,  Chronic renal insufficiency,  atrophic kidney,  living off one kidney, seen by nephrology,Dr. Candiss Burns chronic back pain, sciatica, spinal stenosis, back surgery 2  prostate cancer, radioactive seeds  DVT in right leg noted before 2012 surgery, on  xarelto echocardiogram October 2017 showing mild to moderate aortic valve stenosis, moderate MR, moderately elevated right heart pressures, ejection fraction 55% or greater prior smoking history less than 10 years when he was younger  who presents for follow-up of his coronary disease and PAD chronic shortness of breath,   Long hospitalization for acute on chronic respiratory distress Hospital records reviewed with the patient in detail On and off bipap, aggressive diuresis , severe pulm HTN, CRF Mod to severe MR,  mod TR, EF 35% milrinone during this admission  Moderate aortic stenosis with moderate aortic insufficiency D/c on lasix 40 BID  Feels well BP stable 449 systolic Weight trending up Weight when he got home 128 Weight at home now 130  Starting to do some exercise, only walking short distances Limited by shortness of breath   EKG personally reviewed by myself on todays visit Shows normal sinus rhythm rate 68 bpm nonspecific ST and T wave  abnormality V3 through V6 1 and aVL, PVC noted  Other past medical history reviewed Cardiac cath 02/14/217 Severe underlying heavily calcified three-vessel coronary artery disease occluded LAD,  ostial right coronary artery and severe ostial left circumflex disease.   Patent LIMA to LAD and SVG to right coronary artery. The SVG to OM is possibly occluded, not visible on asending aortogram.  graft might be open given that there is some mild competitive flow in the distal left circumflex. -- left subclavian tortuosity and heavy calcifications throughout.  2.  Right heart catheterization showed only mildly elevated filling pressures, moderate pulmonary hypertension and normal cardiac output. Pulmonary wedge pressure was 16 mmHg, PA pressure was 55 over 13 mmHg and cardiac output was 5.84 with a cardiac index of 3.5.  3.  Left ventricular angiography was not performed due to chronic kidney disease.  --The native left circumflex is too high risk for PCI given involvement of left main, ostial location and heavy calcifications.  treat medically  --moderate pulmonary hypertension   Followed by Dr. Candiss Burns Previous creatinine 1.48 in October  Takes Lasix daily , sometimes twice a day After fluid hydration following cardiac catheterization creatinine down to 1.2, BUN 21 GFR up to 50  Seen in the emergency room 07/22/2016 for chest pain This occurred when he was rushing and had significant shortness of breath Last stress test September 2017, no significant ischemia on review of images by myself Blood pressure elevated in the emergency room Creatinine up to 1.66, BUN 53  continues to have stable but moderate intensity shortness of breath,  worse on exertion.  Recent episode of chest discomfort on extreme exertion   chronic back pain, now with numbness down his legs Sees pain management, taking tramadol, oxy BID prn  Other past medical history reviewed Previous noninvasive vascular  evaluation which showed normal ABI on the right side and mildly reduced on the left at 0.71.  Duplex showed significant bilateral common iliac artery stenosis as well as severe left SFA stenosis.  Carotid ultrasound reviewed with him showing 60-79% left carotid disease 40-59% disease on the right  Lower extremity arterial Doppler showing at least moderate common iliac arterial disease, severe disease left mid SFA   back surgery 2012 and 2014  PMH:   has a past medical history of Allergic rhinitis due to pollen, Chronic kidney disease, stage III (moderate) ([place]), Coronary artery disease, DVT, lower extremity, recurrent ([place]), Glaucoma, History of prostate cancer (2004), Hyperlipidemia, Hypertension, Hypothyroidism, Impaired fasting glucose, and Spinal stenosis of lumbar region with radiculopathy.  PSH:    Past Surgical History:  Procedure Laterality Date  . CAROTID ENDARTERECTOMY Right 09/2004   [place] GRAFT  2003   [place]   after fall  . INSERTION PROSTATE RADIATION SEED  2004   and RT  . POSTERIOR LUMBAR FUSION  2012, 2014  . RIGHT/LEFT HEART CATH AND CORONARY ANGIOGRAPHY N/A 02/14/2017   Procedure: RIGHT/LEFT HEART CATH AND CORONARY ANGIOGRAPHY;  Surgeon: Dennis Hampshire, MD;  Location: [facility];  Service: Cardiovascular;  Laterality: N/A;    Current Outpatient Medications  Medication Sig Dispense Refill  . Alpha-Lipoic Acid 200 MG CAPS Take 1 capsule by mouth daily.    Marland Kitchen apixaban (ELIQUIS) 2.5 MG TABS tablet Take 1 tablet (2.5 mg total) by mouth 2 (two) times daily. 60 tablet 0  . atorvastatin (LIPITOR) 40 MG tablet Take 40 mg by mouth daily.    Marland Kitchen CALCIUM CITRATE PO Take 1 tablet by mouth daily. Takes 300mg  daily.    . carvedilol (COREG) 12.5 MG tablet Take 1 tablet (12.5 mg total) by mouth 2 (two) times daily with a meal. 60 tablet 0  . Cholecalciferol (VITAMIN D) 2000 units CAPS Take 4,000 Units  by mouth daily.    . Coenzyme Q10 (CO Q10) 200 MG CAPS Take 1 capsule by mouth daily.    Marland Kitchen CRANBERRY EXTRACT PO Take 1 tablet by mouth daily. Takes 1500mg  daily    . ezetimibe (ZETIA) 10 MG tablet Take 1 tablet (10 mg total) by mouth daily. 90 tablet 4  . feeding supplement, ENSURE ENLIVE, (ENSURE ENLIVE) LIQD Take 237 mLs by mouth 2 (two) times daily between meals. 237 mL 12  . furosemide (LASIX) 40 MG tablet Take 1 tablet (40 mg total) by mouth 2 (two) times daily. 30 tablet 0  . hydrALAZINE (APRESOLINE) 10 MG tablet Take 1 tablet (10 mg total) by mouth 3 (three) times daily. 90 tablet 11  . isosorbide mononitrate (IMDUR) 30 MG 24 hr tablet Take 2 tablets (60 mg total) by mouth daily. 90 tablet 3  . latanoprost (XALATAN) 0.005 % ophthalmic solution Place 1 drop into both eyes at bedtime. In morning    . levothyroxine (SYNTHROID, LEVOTHROID) 75 MCG tablet Take 1 tablet (75 mcg total) by mouth daily. 90 tablet 3  . Multiple Vitamin (MULTIVITAMIN) tablet Take 1 tablet by mouth daily.    . nitroGLYCERIN (NITROSTAT) 0.4 MG SL tablet Place 1 tablet (0.4 mg total) under  the tongue every 5 (five) minutes as needed for chest pain.  12  . Omega 3-6-9 Fatty Acids (OMEGA-3-6-9 PO) Take 1 capsule by mouth 2 (two) times daily.    . timolol (TIMOPTIC) 0.25 % ophthalmic solution Place 1 drop into both eyes 2 (two) times daily. am    . traMADol (ULTRAM) 50 MG tablet Take 2 tablets (100 mg total) by mouth 3 (three) times daily as needed. 30 tablet 2   No current facility-administered medications for this visit.      Allergies:   Patient has no known allergies.   Social History:  The patient  reports that he quit smoking about 41 years ago. he has never used smokeless tobacco. He reports that he drinks alcohol. He reports that he does not use drugs.   Family History:   family history includes Cancer in his father and mother; Glaucoma in his mother; Stroke in his maternal grandfather.    Review of  Systems: Review of Systems  Respiratory: Positive for shortness of breath.   Cardiovascular: Negative.   Gastrointestinal: Negative.   Musculoskeletal: Negative.   Neurological: Positive for weakness.  Psychiatric/Behavioral: Negative.   All other systems reviewed and are negative.    PHYSICAL EXAM: VS:  BP (!) 140/56 (BP Location: Left Arm, Patient Position: Sitting, Cuff Size: Normal)   Pulse 68   Ht 5\' 3"  (1.6 m)   Wt 133 lb 8 oz (60.6 kg)   SpO2 96%   BMI 23.65 kg/m  , BMI Body mass index is 23.65 kg/m. Constitutional:  oriented to person, place, and time. No distress.  HENT:  Head: Normocephalic and atraumatic.  Eyes:  no discharge. No scleral icterus.  Neck: Normal range of motion. Neck supple. + JVD present. 12+ Cardiovascular: Normal rate, regular rhythm, normal heart sounds and intact distal pulses. Exam reveals no gallop and no friction rub. No edema No murmur heard. Pulmonary/Chest: Effort normal and breath sounds normal. No stridor. No respiratory distress.  no wheezes.  no rales.  no tenderness.  Abdominal: Soft.  no distension.  no tenderness.  Musculoskeletal: Normal range of motion.  no  tenderness or deformity.  Neurological:  normal muscle tone. Coordination normal. No atrophy Skin: Skin is warm and dry. No rash noted. not diaphoretic.  Psychiatric:  normal mood and affect. behavior is normal. Thought content normal.      Recent Labs: 04/30/2017: TSH 1.819 05/07/2017: B Natriuretic Peptide 3,671.0; Magnesium 2.1 05/08/2017: Hemoglobin 11.0; Platelets 402 05/10/2017: BUN 74; Creatinine, Ser 1.60; Potassium 4.1; Sodium 137    Lipid Panel Lab Results  Component Value Date   CHOL 114 05/01/2017   HDL 34 (L) 05/01/2017   LDLCALC 58 05/01/2017   TRIG 109 05/01/2017      Wt Readings from Last 3 Encounters:  05/21/17 133 lb 8 oz (60.6 kg)  05/10/17 129 lb 14.4 oz (58.9 kg)  04/30/17 147 lb 12 oz (67 kg)       ASSESSMENT AND PLAN:  Coronary artery  disease involving native coronary artery of native heart without angina pectoris  Medical management at this time including ostial left circumflex lesion Currently denies any anginal symptoms  Pulmonary hypertension Stressed importance of aggressive monitoring of his weight Goal weight 130 pounds at home Titration of Lasix higher if needed Fluid restriction Currently on Lasix 40 twice daily, dose can be increased for weight gain  Pure hypercholesterolemia - Plan: EKG 27-XAJO, Basic Metabolic Panel (BMET) Tolerating Zetia with Lipitor Goal LDL less than 70  Essential hypertension - Plan: EKG 22-LNLG, Basic Metabolic Panel (BMET) Blood pressure is well controlled on today's visit. No changes made to the medications. Stable, on less medications after weight loss in the hospital   SOB (shortness of breath) - Plan: EKG 92-JJHE, Basic Metabolic Panel (BMET) Moderate, stable Recommended he continue his rehabilitation  Recurrent deep vein thrombosis (DVT) of right lower extremity (HCC) on anticoagulation , Eliquis 2.5 twice daily given his renal dysfunction   Spinal stenosis of lumbar region with radiculopathy complaining about lower extremity neuropathy, Stable  Chronic kidney disease, stage III (moderate) Followed by Dr. Candiss Burns Creatinine 1.610 days ago  PAD (peripheral artery disease) ([place]) Stable at least moderate bilateral disease Previously seen by Dr. Fletcher Anon   Total encounter time more than 45 minutes  Greater than 50% was spent in counseling and coordination of care with the patient  Disposition:   F/U  6 months   Orders Placed This Encounter  Procedures  . EKG 12-Lead     Signed, Esmond Plants, M.D., Ph.D. 05/21/2017  [place], [place]

## 2017-05-21 ENCOUNTER — Encounter: Payer: Self-pay | Admitting: Cardiovascular Disease

## 2017-05-21 ENCOUNTER — Ambulatory Visit: Payer: Medicare PPO | Admitting: Cardiovascular Disease

## 2017-05-21 VITALS — BP 140/56 | HR 68 | Ht 63.0 in | Wt 133.5 lb

## 2017-05-21 DIAGNOSIS — I5042 Chronic combined systolic (congestive) and diastolic (congestive) heart failure: Secondary | ICD-10-CM

## 2017-05-21 DIAGNOSIS — I272 Pulmonary hypertension, unspecified: Secondary | ICD-10-CM

## 2017-05-21 DIAGNOSIS — N183 Chronic kidney disease, stage 3 unspecified: Secondary | ICD-10-CM

## 2017-05-21 DIAGNOSIS — I25118 Atherosclerotic heart disease of native coronary artery with other forms of angina pectoris: Secondary | ICD-10-CM

## 2017-05-21 DIAGNOSIS — J9601 Acute respiratory failure with hypoxia: Secondary | ICD-10-CM

## 2017-05-21 DIAGNOSIS — I739 Peripheral vascular disease, unspecified: Secondary | ICD-10-CM

## 2017-05-21 MED ORDER — APIXABAN 2.5 MG PO TABS: 2.5000 mg | ORAL_TABLET | Freq: Two times a day (BID) | ORAL | 3 refills | Status: DC

## 2017-05-21 NOTE — Patient Instructions (Signed)
Medication Instructions:   No medication changes made  Labwork:  No new labs needed  Testing/Procedures:  No further testing at this time   Follow-Up: It was a pleasure seeing you in the office today. Please call us if you have new issues that need to be addressed before your next appt.  [phone]  Your physician wants you to follow-up in: 3 months.  You will receive a reminder letter in the mail two months in advance. If you don't receive a letter, please call our office to schedule the follow-up appointment.  If you need a refill on your cardiac medications before your next appointment, please call your pharmacy.  For educational health videos Log in to : [email or web address] Or : [email or web address], password : [email or web address]

## 2017-05-22 ENCOUNTER — Encounter: Payer: Self-pay | Admitting: Cardiovascular Disease

## 2017-05-25 ENCOUNTER — Ambulatory Visit: Payer: Medicare PPO | Admitting: Internal Medicine

## 2017-05-25 ENCOUNTER — Encounter: Payer: Self-pay | Admitting: Internal Medicine

## 2017-05-25 ENCOUNTER — Ambulatory Visit: Payer: Medicare PPO | Admitting: Family

## 2017-05-25 VITALS — BP 116/62 | HR 80 | Temp 97.9°F | Ht 63.0 in | Wt 135.0 lb

## 2017-05-25 DIAGNOSIS — M48061 Spinal stenosis, lumbar region without neurogenic claudication: Secondary | ICD-10-CM

## 2017-05-25 DIAGNOSIS — M5416 Radiculopathy, lumbar region: Secondary | ICD-10-CM | POA: Diagnosis not present

## 2017-05-25 DIAGNOSIS — N183 Chronic kidney disease, stage 3 unspecified: Secondary | ICD-10-CM

## 2017-05-25 DIAGNOSIS — I5042 Chronic combined systolic (congestive) and diastolic (congestive) heart failure: Secondary | ICD-10-CM | POA: Diagnosis not present

## 2017-05-25 DIAGNOSIS — I272 Pulmonary hypertension, unspecified: Secondary | ICD-10-CM

## 2017-05-25 LAB — RENAL FUNCTION PANEL
Albumin: 3.7 g/dL (ref 3.5–5.2)
BUN: 45 mg/dL — ABNORMAL HIGH (ref 6–23)
CALCIUM: 9.1 mg/dL (ref 8.4–10.5)
CHLORIDE: 103 meq/L (ref 96–112)
CO2: 27 meq/L (ref 19–32)
Creatinine, Ser: 1.44 mg/dL (ref 0.40–1.50)
GFR: 49.35 mL/min — AB (ref >60.00)
Glucose, Bld: 130 mg/dL — ABNORMAL HIGH (ref 70–99)
POTASSIUM: 4 meq/L (ref 3.5–5.1)
Phosphorus: 3.5 mg/dL (ref 2.3–4.6)
Sodium: 141 mEq/L (ref 135–145)

## 2017-05-25 LAB — CBC
HCT: 34.4 % — ABNORMAL LOW (ref 39.0–52.0)
HEMOGLOBIN: 11.1 g/dL — AB (ref 13.0–17.0)
MCHC: 32.4 g/dL (ref 30.0–36.0)
MCV: 91.1 fl (ref 78.0–100.0)
PLATELETS: 234 10*3/uL (ref 150.0–400.0)
RBC: 3.78 Mil/uL — AB (ref 4.22–5.81)
RDW: 16.6 % — ABNORMAL HIGH (ref 11.5–15.5)
WBC: 6.2 10*3/uL (ref 4.0–10.5)

## 2017-05-25 MED ORDER — HYDRALAZINE HCL 10 MG PO TABS: 10.0000 mg | ORAL_TABLET | Freq: Three times a day (TID) | ORAL | 3 refills | Status: DC

## 2017-05-25 NOTE — Assessment & Plan Note (Signed)
Seems compensated now Monitoring weight Slowly increasing exercise--- ?cardiac rehab a thought On isosorbide/hydralazine for now---might be able to reconsider low dose ARB in future

## 2017-05-25 NOTE — Progress Notes (Signed)
Subjective:    Patient ID: Dennis Burns, male    DOB: [birth year], 84 y.o.   MRN: [id number]  HPI Here with wife for follow up after hospitalization and rehab at [facility] I did see him there  Home for a week Doing fine Now on hydralazine--off the diovan and nifedipine from the hospital with the AKI No dizziness or orthostatic symptoms  Trying to walk outside by his home---increasing the distance HR does go up over 110 fairly quickly Recovery to 60-70's (using finger probe)  No chest pain Not really SOB---was able to tolerate carrying several trips with groceries (yesterday) No skipped beats--- but feels the faster heart rate No edema  Current Outpatient Medications on File Prior to Visit  Medication Sig Dispense Refill  . Alpha-Lipoic Acid 200 MG CAPS Take 1 capsule by mouth daily.    Marland Kitchen apixaban (ELIQUIS) 2.5 MG TABS tablet Take 1 tablet (2.5 mg total) by mouth 2 (two) times daily. 180 tablet 3  . atorvastatin (LIPITOR) 40 MG tablet Take 40 mg by mouth daily.    Marland Kitchen CALCIUM CITRATE PO Take 1 tablet by mouth daily. Takes 300mg  daily.    . carvedilol (COREG) 12.5 MG tablet Take 1 tablet (12.5 mg total) by mouth 2 (two) times daily with a meal. 60 tablet 0  . Cholecalciferol (VITAMIN D) 2000 units CAPS Take 4,000 Units by mouth daily.    . Coenzyme Q10 (CO Q10) 200 MG CAPS Take 1 capsule by mouth daily.    Marland Kitchen CRANBERRY EXTRACT PO Take 1 tablet by mouth daily. Takes 1500mg  daily    . ezetimibe (ZETIA) 10 MG tablet Take 1 tablet (10 mg total) by mouth daily. 90 tablet 4  . feeding supplement, ENSURE ENLIVE, (ENSURE ENLIVE) LIQD Take 237 mLs by mouth 2 (two) times daily between meals. 237 mL 12  . furosemide (LASIX) 40 MG tablet Take 1 tablet (40 mg total) by mouth 2 (two) times daily. 30 tablet 0  . hydrALAZINE (APRESOLINE) 10 MG tablet Take 1 tablet (10 mg total) by mouth 3 (three) times daily. 90 tablet 11  . isosorbide mononitrate (IMDUR) 30 MG 24 hr tablet Take 2 tablets (60 mg  total) by mouth daily. 90 tablet 3  . latanoprost (XALATAN) 0.005 % ophthalmic solution Place 1 drop into both eyes at bedtime. In morning    . levothyroxine (SYNTHROID, LEVOTHROID) 75 MCG tablet Take 1 tablet (75 mcg total) by mouth daily. 90 tablet 3  . Multiple Vitamin (MULTIVITAMIN) tablet Take 1 tablet by mouth daily.    . nitroGLYCERIN (NITROSTAT) 0.4 MG SL tablet Place 1 tablet (0.4 mg total) under the tongue every 5 (five) minutes as needed for chest pain.  12  . Omega 3-6-9 Fatty Acids (OMEGA-3-6-9 PO) Take 1 capsule by mouth 2 (two) times daily.    . timolol (TIMOPTIC) 0.25 % ophthalmic solution Place 1 drop into both eyes 2 (two) times daily. am    . traMADol (ULTRAM) 50 MG tablet Take 2 tablets (100 mg total) by mouth 3 (three) times daily as needed. 30 tablet 2   No current facility-administered medications on file prior to visit.     No Known Allergies  Past Medical History:  Diagnosis Date  . Allergic rhinitis due to pollen    as child--better now  . Chronic kidney disease, stage III (moderate) (HCC)   . Coronary artery disease   . DVT, lower extremity, recurrent ([place])   . Glaucoma    [facility]   .  History of prostate cancer 2004  . Hyperlipidemia   . Hypertension   . Hypothyroidism   . Impaired fasting glucose   . Spinal stenosis of lumbar region with radiculopathy     Past Surgical History:  Procedure Laterality Date  . CAROTID ENDARTERECTOMY Right 09/2004   [place] GRAFT  2003   [place]   after fall  . INSERTION PROSTATE RADIATION SEED  2004   and RT  . POSTERIOR LUMBAR FUSION  2012, 2014  . RIGHT/LEFT HEART CATH AND CORONARY ANGIOGRAPHY N/A 02/14/2017   Procedure: RIGHT/LEFT HEART CATH AND CORONARY ANGIOGRAPHY;  Surgeon: Wellington Hampshire, MD;  Location: [place] [facility];  Service: Cardiovascular;  Laterality: N/A;    Family History  Problem Relation Age of Onset  . Cancer Mother          colon (cause of death) and breast  . Glaucoma Mother   . Cancer Father        colon  . Stroke Maternal Grandfather   . Diabetes Neg Hx     Social History   Socioeconomic History  . Marital status: Married    Spouse name: Not on file  . Number of children: 5  . Years of education: Not on file  . Highest education level: Not on file  Occupational History  . 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 Financial resource strain: Not on file  . Food insecurity:    Worry: Not on file    Inability: Not on file  . Transportation needs:    Medical: Not on file    Non-medical: Not on file  Tobacco Use  . Smoking status: Former Smoker    Last attempt to quit: 1978    Years since quitting: 41.2  . Smokeless tobacco: Never Used  Substance and Sexual Activity  . Alcohol use: Yes  . Drug use: No  . Sexual activity: Not on file  Lifestyle  . Physical activity:    Days per week: Not on file    Minutes per session: Not on file  . Stress: Not on file  Relationships  . Social connections:    Talks on phone: Not on file    Gets together: Not on file    Attends religious service: Not on file    Active member of club or organization: Not on file    Attends meetings of clubs or organizations: Not on file    Relationship status: Not on file  . Intimate partner violence:    Fear of current or ex partner: Not on file    Emotionally abused: Not on file    Physically abused: Not on file    Forced sexual activity: Not on file  Other Topics Concern  . Not on file  Social History Narrative   2nd marriage--- 1980   5 children--2 step children   12 grandchildren   3 great grandchildren      Has living will   Wife is health care POA   Would accept resuscitation   Not sure about tube feeds   Review of Systems Eating well Has gained back 2# Not sleeping well--same as usual.  Sleeps with wedge pillow---no PND Back pain is about the  same    Objective:   Physical Exam  Constitutional: No distress.  Neck: No thyromegaly present.  Cardiovascular: Normal rate and regular rhythm. Exam reveals  no gallop.  Coarse Gr 2/6 systolic murmur at base  Pulmonary/Chest: Effort normal and breath sounds normal. No respiratory distress. He has no wheezes. He has no rales.  Abdominal: Soft. There is no tenderness.  Musculoskeletal: He exhibits no edema.  Lymphadenopathy:    He has no cervical adenopathy.          Assessment & Plan:

## 2017-05-25 NOTE — Assessment & Plan Note (Signed)
This has been stable.

## 2017-05-25 NOTE — Assessment & Plan Note (Signed)
With AKI Not clear if this was due to decreased perfusion with his cardiac status, the ARB--or both Will recheck  Reconsider low dose ARB in the future

## 2017-05-25 NOTE — Assessment & Plan Note (Signed)
Was fairly severe Will continue to monitor oxygen at home (usually 95-97%) On diuretic

## 2017-05-28 ENCOUNTER — Telehealth: Payer: Self-pay | Admitting: *Deleted

## 2017-05-28 NOTE — Telephone Encounter (Signed)
Spoke with patients wife per release form and reviewed recommendations for cardiac rehab. She verbalized understanding and was agreeable with this. Order entered and advised that someone should be in touch to get this scheduled. She was appreciative for the call with no further questions at this time.

## 2017-05-28 NOTE — Telephone Encounter (Signed)
-----   Message from Minna Merritts, MD sent at 05/27/2017 12:27 PM EDT ----- Sounds like a great idea Triage, Can we call him to see if cardiac rehab was arranged when he left the hospital?  would like to participate in cardiac rehab if not arranged yet? Might help with his continued recovery thx TG    ----- Message ----- From: [name], MD Sent: 05/25/2017  12:47 PM To: Minna Merritts, MD  Tim, Is he a candidate for cardiac rehab? Rich

## 2017-06-04 ENCOUNTER — Encounter: Payer: Medicare PPO | Attending: Cardiovascular Disease | Admitting: *Deleted

## 2017-06-04 VITALS — Ht 63.3 in | Wt 134.5 lb

## 2017-06-04 DIAGNOSIS — I214 Non-ST elevation (NSTEMI) myocardial infarction: Secondary | ICD-10-CM | POA: Diagnosis not present

## 2017-06-04 NOTE — Progress Notes (Signed)
Daily Session Note  Patient Details  Name: Dennis Burns MRN: [id number] Date of Birth: 04-15-1928 Referring Provider:     Cardiac Rehab from 06/04/2017 in [facility]  Referring Provider  Ida Rogue MD      Encounter Date: 06/04/2017  Check In: Session Check In - 06/04/17 1347      Pain Assessment   Currently in Pain?  No/denies        Exercise Prescription Changes - 06/04/17 1300      Response to Exercise   Blood Pressure (Admit)  130/64    Blood Pressure (Exercise)  144/66    Blood Pressure (Exit)  124/62    Heart Rate (Admit)  86 bpm    Heart Rate (Exercise)  118 bpm    Heart Rate (Exit)  97 bpm    Oxygen Saturation (Admit)  98 %    Oxygen Saturation (Exercise)  96 %    Oxygen Saturation (Exit)  96 %    Rating of Perceived Exertion (Exercise)  15    Perceived Dyspnea (Exercise)  2    Symptoms  SOB    Comments  walk test results       Social History   Tobacco Use  Smoking Status Former Smoker  . Packs/day: 0.25  . Years: 18.00  . Pack years: 4.50  . Last attempt to quit: 1978  . Years since quitting: 41.2  Smokeless Tobacco Never Used  Tobacco Comment   Quit in 1978    Goals Met:  Exercise tolerated well Personal goals reviewed No report of cardiac concerns or symptoms  Goals Unmet:  Not Applicable  Comments: Med review   Dr. Emily Filbert is Medical Director for [facility].

## 2017-06-04 NOTE — Progress Notes (Signed)
Cardiac Individual Treatment Plan  Patient Details  Name: Dennis Burns MRN: [id number] Date of Birth: Feb 28, 1929 Referring Provider:     Cardiac Rehab from 06/04/2017 in [facility] Cardiac and Pulmonary Rehab  Referring Provider  Ida Rogue MD      Initial Encounter Date:    Cardiac Rehab from 06/04/2017 in [facility] Cardiac and Pulmonary Rehab  Date  06/04/17  Referring Provider  Ida Rogue MD      Visit Diagnosis: NSTEMI (non-ST elevated myocardial infarction) [facility])  Patient's Home Medications on Admission:  Current Outpatient Medications:  .  Alpha-Lipoic Acid 200 MG CAPS, Take 1 capsule by mouth daily., Disp: , Rfl:  .  apixaban (ELIQUIS) 2.5 MG TABS tablet, Take 1 tablet (2.5 mg total) by mouth 2 (two) times daily., Disp: 180 tablet, Rfl: 3 .  atorvastatin (LIPITOR) 40 MG tablet, Take 40 mg by mouth daily., Disp: , Rfl:  .  CALCIUM CITRATE PO, Take 1 tablet by mouth daily. Takes 36m daily., Disp: , Rfl:  .  carvedilol (COREG) 12.5 MG tablet, Take 1 tablet (12.5 mg total) by mouth 2 (two) times daily with a meal., Disp: 60 tablet, Rfl: 0 .  Cholecalciferol (VITAMIN D) 2000 units CAPS, Take 4,000 Units by mouth daily., Disp: , Rfl:  .  Coenzyme Q10 (CO Q10) 200 MG CAPS, Take 1 capsule by mouth daily., Disp: , Rfl:  .  CRANBERRY EXTRACT PO, Take 1 tablet by mouth daily. Takes 15010mdaily, Disp: , Rfl:  .  ezetimibe (ZETIA) 10 MG tablet, Take 1 tablet (10 mg total) by mouth daily., Disp: 90 tablet, Rfl: 4 .  feeding supplement, ENSURE ENLIVE, (ENSURE ENLIVE) LIQD, Take 237 mLs by mouth 2 (two) times daily between meals., Disp: 237 mL, Rfl: 12 .  furosemide (LASIX) 40 MG tablet, Take 1 tablet (40 mg total) by mouth 2 (two) times daily., Disp: 30 tablet, Rfl: 0 .  hydrALAZINE (APRESOLINE) 10 MG tablet, Take 1 tablet (10 mg total) by mouth 3 (three) times daily., Disp: 270 tablet, Rfl: 3 .  isosorbide mononitrate (IMDUR) 30 MG 24 hr tablet, Take 2 tablets (60 mg total) by mouth  daily., Disp: 90 tablet, Rfl: 3 .  latanoprost (XALATAN) 0.005 % ophthalmic solution, Place 1 drop into both eyes at bedtime. In morning, Disp: , Rfl:  .  levothyroxine (SYNTHROID, LEVOTHROID) 75 MCG tablet, Take 1 tablet (75 mcg total) by mouth daily., Disp: 90 tablet, Rfl: 3 .  Multiple Vitamin (MULTIVITAMIN) tablet, Take 1 tablet by mouth daily., Disp: , Rfl:  .  nitroGLYCERIN (NITROSTAT) 0.4 MG SL tablet, Place 1 tablet (0.4 mg total) under the tongue every 5 (five) minutes as needed for chest pain., Disp: , Rfl: 12 .  Omega 3-6-9 Fatty Acids (OMEGA-3-6-9 PO), Take 1 capsule by mouth 2 (two) times daily., Disp: , Rfl:  .  timolol (TIMOPTIC) 0.25 % ophthalmic solution, Place 1 drop into both eyes 2 (two) times daily. am, Disp: , Rfl:  .  traMADol (ULTRAM) 50 MG tablet, Take 2 tablets (100 mg total) by mouth 3 (three) times daily as needed., Disp: 30 tablet, Rfl: 2  Past Medical History: Past Medical History:  Diagnosis Date  . Allergic rhinitis due to pollen    as child--better now  . Chronic kidney disease, stage III (moderate) (HCC)   . Coronary artery disease   . [place], [place]  . [facility] History of prostate cancer 2004  . Hyperlipidemia   .  Hypertension   . Hypothyroidism   . Impaired fasting glucose   . Spinal stenosis of lumbar region with radiculopathy     Tobacco Use: Social History   Tobacco Use  Smoking Status Former Smoker  . Packs/day: 0.25  . Years: 18.00  . Pack years: 4.50  . Last attempt to quit: 1978  . Years since quitting: 41.2  Smokeless Tobacco Never Used  Tobacco Comment   Quit in 1978    Labs: Recent Review Flowsheet Data    Labs for ITP Cardiac and Pulmonary Rehab Latest Ref Rng & Units 02/07/2016 02/14/2017 02/14/2017 05/01/2017   Cholestrol 0 - 200 mg/dL 136 - - 114   LDLCALC 0 - 99 mg/dL 64 - - 58   HDL >40 mg/dL 54 - - 34(L)   Trlycerides <150 mg/dL 92 - - 109   PHART 7.350 - 7.450 - - 7.386  7.44   PCO2ART 32.0 - 48.0 mmHg - - 33.9 29(L)   HCO3 20.0 - 28.0 mmol/L - 18.6(L) 20.3 19.7(L)   TCO2 22 - 32 mmol/L - 20(L) 21(L) -   ACIDBASEDEF 0.0 - 2.0 mmol/L - 5.0(H) 4.0(H) 3.2(H)   O2SAT % - 70.0 92.0 92.3       Exercise Target Goals: Date: 06/04/17  Exercise Program Goal: Individual exercise prescription set using results from initial 6 min walk test and THRR while considering  patient's activity barriers and safety.   Exercise Prescription Goal: Initial exercise prescription builds to 30-45 minutes a day of aerobic activity, 2-3 days per week.  Home exercise guidelines will be given to patient during program as part of exercise prescription that the participant will acknowledge.  Activity Barriers & Risk Stratification: Activity Barriers & Cardiac Risk Stratification - 06/04/17 1400      Activity Barriers & Cardiac Risk Stratification   Activity Barriers  -- Chronic back and neck pain: controls with exercise and tramadol as needed.  2 spinal fusions Was very active       6 Minute Walk: 6 Minute Walk    Row Name 06/04/17 1515         6 Minute Walk   Phase  Initial     Distance  1030 feet     Walk Time  6 minutes     # of Rest Breaks  0     MPH  1.95     METS  1.98     RPE  15     Perceived Dyspnea   2     VO2 Peak  6.91     Symptoms  Yes (comment)     Comments  SOB     Resting HR  86 bpm     Resting BP  130/64     Resting Oxygen Saturation   98 %     Exercise Oxygen Saturation  during 6 min walk  96 %     Max Ex. HR  118 bpm     Max Ex. BP  144/66     2 Minute Post BP  124/62       Interval HR   1 Minute HR  112     2 Minute HR  117     3 Minute HR  119     4 Minute HR  117     5 Minute HR  119     6 Minute HR  119     2 Minute Post HR  97     Interval Heart  Rate?  Yes       Interval Oxygen   Interval Oxygen?  Yes     Baseline Oxygen Saturation %  98 %     1 Minute Oxygen Saturation %  96 %     1 Minute Liters of Oxygen  0 L Room Air     2  Minute Oxygen Saturation %  97 %     2 Minute Liters of Oxygen  0 L     3 Minute Oxygen Saturation %  98 %     3 Minute Liters of Oxygen  0 L     4 Minute Oxygen Saturation %  98 %     4 Minute Liters of Oxygen  0 L     5 Minute Oxygen Saturation %  98 %     5 Minute Liters of Oxygen  0 L     6 Minute Oxygen Saturation %  96 %     6 Minute Liters of Oxygen  0 L     2 Minute Post Oxygen Saturation %  96 %     2 Minute Post Liters of Oxygen  0 L        Oxygen Initial Assessment:   Oxygen Re-Evaluation:   Oxygen Discharge (Final Oxygen Re-Evaluation):   Initial Exercise Prescription: Initial Exercise Prescription - 06/04/17 1500      Date of Initial Exercise RX and Referring Provider   Date  06/04/17    Referring Provider  Ida Rogue MD      Treadmill   MPH  1.3    Grade  0.5    Minutes  15    METs  2.09      NuStep   Level  2    SPM  80    Minutes  15    METs  2      Recumbant Elliptical   Level  1    RPM  50    Minutes  15    METs  2      Prescription Details   Frequency (times per week)  3    Duration  Progress to 45 minutes of aerobic exercise without signs/symptoms of physical distress      Intensity   THRR 40-80% of Max Heartrate  105-124    Ratings of Perceived Exertion  11-15    Perceived Dyspnea  0-4      Progression   Progression  Continue to progress workloads to maintain intensity without signs/symptoms of physical distress.      Resistance Training   Training Prescription  Yes    Weight  3 lbs    Reps  10-15       Perform Capillary Blood Glucose checks as needed.  Exercise Prescription Changes: Exercise Prescription Changes    Row Name 06/04/17 1300             Response to Exercise   Blood Pressure (Admit)  130/64       Blood Pressure (Exercise)  144/66       Blood Pressure (Exit)  124/62       Heart Rate (Admit)  86 bpm       Heart Rate (Exercise)  118 bpm       Heart Rate (Exit)  97 bpm       Oxygen Saturation  (Admit)  98 %       Oxygen Saturation (Exercise)  96 %       Oxygen Saturation (Exit)  96 %       Rating of Perceived Exertion (Exercise)  15       Perceived Dyspnea (Exercise)  2       Symptoms  SOB       Comments  walk test results          Exercise Comments:   Exercise Goals and Review: Exercise Goals    Row Name 06/04/17 1519             Exercise Goals   Increase Physical Activity  Yes       Intervention  Develop an individualized exercise prescription for aerobic and resistive training based on initial evaluation findings, risk stratification, comorbidities and participant's personal goals.;Provide advice, education, support and counseling about physical activity/exercise needs.       Expected Outcomes  Short Term: Attend rehab on a regular basis to increase amount of physical activity.;Long Term: Add in home exercise to make exercise part of routine and to increase amount of physical activity.;Long Term: Exercising regularly at least 3-5 days a week.       Increase Strength and Stamina  Yes       Intervention  Provide advice, education, support and counseling about physical activity/exercise needs.;Develop an individualized exercise prescription for aerobic and resistive training based on initial evaluation findings, risk stratification, comorbidities and participant's personal goals.       Expected Outcomes  Short Term: Increase workloads from initial exercise prescription for resistance, speed, and METs.;Short Term: Perform resistance training exercises routinely during rehab and add in resistance training at home;Long Term: Improve cardiorespiratory fitness, muscular endurance and strength as measured by increased METs and functional capacity (6MWT)       Able to understand and use rate of perceived exertion (RPE) scale  Yes       Intervention  Provide education and explanation on how to use RPE scale       Expected Outcomes  Short Term: Able to use RPE daily in rehab to  express subjective intensity level;Long Term:  Able to use RPE to guide intensity level when exercising independently       Able to understand and use Dyspnea scale  Yes       Intervention  Provide education and explanation on how to use Dyspnea scale       Expected Outcomes  Short Term: Able to use Dyspnea scale daily in rehab to express subjective sense of shortness of breath during exertion;Long Term: Able to use Dyspnea scale to guide intensity level when exercising independently       Knowledge and understanding of Target Heart Rate Range (THRR)  Yes       Intervention  Provide education and explanation of THRR including how the numbers were predicted and where they are located for reference       Expected Outcomes  Short Term: Able to state/look up THRR;Short Term: Able to use daily as guideline for intensity in rehab;Long Term: Able to use THRR to govern intensity when exercising independently       Able to check pulse independently  Yes       Intervention  Provide education and demonstration on how to check pulse in carotid and radial arteries.;Review the importance of being able to check your own pulse for safety during independent exercise       Expected Outcomes  Short Term: Able to explain why pulse checking is important during independent exercise;Long Term: Able to check pulse independently and accurately  Understanding of Exercise Prescription  Yes       Intervention  Provide education, explanation, and written materials on patient's individual exercise prescription       Expected Outcomes  Short Term: Able to explain program exercise prescription;Long Term: Able to explain home exercise prescription to exercise independently          Exercise Goals Re-Evaluation :   Discharge Exercise Prescription (Final Exercise Prescription Changes): Exercise Prescription Changes - 06/04/17 1300      Response to Exercise   Blood Pressure (Admit)  130/64    Blood Pressure (Exercise)   144/66    Blood Pressure (Exit)  124/62    Heart Rate (Admit)  86 bpm    Heart Rate (Exercise)  118 bpm    Heart Rate (Exit)  97 bpm    Oxygen Saturation (Admit)  98 %    Oxygen Saturation (Exercise)  96 %    Oxygen Saturation (Exit)  96 %    Rating of Perceived Exertion (Exercise)  15    Perceived Dyspnea (Exercise)  2    Symptoms  SOB    Comments  walk test results       Nutrition:  Target Goals: Understanding of nutrition guidelines, daily intake of sodium <159m, cholesterol <2067m calories 30% from fat and 7% or less from saturated fats, daily to have 5 or more servings of fruits and vegetables.  Biometrics: Pre Biometrics - 06/04/17 1519      Pre Biometrics   Height  5' 3.3" (1.608 m)    Weight  134 lb 8 oz (61 kg)    Waist Circumference  33 inches    Hip Circumference  35 inches    Waist to Hip Ratio  0.94 %    BMI (Calculated)  23.6    Single Leg Stand  5.1 seconds        Nutrition Therapy Plan and Nutrition Goals: Nutrition Therapy & Goals - 06/04/17 1333      Intervention Plan   Intervention  Prescribe, educate and counsel regarding individualized specific dietary modifications aiming towards targeted core components such as weight, hypertension, lipid management, diabetes, heart failure and other comorbidities.    Expected Outcomes  Short Term Goal: Understand basic principles of dietary content, such as calories, fat, sodium, cholesterol and nutrients.;Short Term Goal: A plan has been developed with personal nutrition goals set during dietitian appointment.;Long Term Goal: Adherence to prescribed nutrition plan.       Nutrition Assessments: Nutrition Assessments - 06/04/17 1333      MEDFICTS Scores   Pre Score  32       Nutrition Goals Re-Evaluation:   Nutrition Goals Discharge (Final Nutrition Goals Re-Evaluation):   Psychosocial: Target Goals: Acknowledge presence or absence of significant depression and/or stress, maximize coping skills,  provide positive support system. Participant is able to verbalize types and ability to use techniques and skills needed for reducing stress and depression.   Initial Review & Psychosocial Screening: Initial Psych Review & Screening - 06/04/17 1412      Initial Review   Current issues with  Current Sleep Concerns;Current Stress Concerns    Source of Stress Concerns  Unable to participate in former interests or hobbies;Unable to perform yard/household activities    Comments  Gene states that his Shortness of Breath gets in the way of him being able to perform his ADLs. He hopes this will improve and allow him to have more time to perform his ADls with less SOB.  Family Dynamics   Good Support System?  Yes Spouse, 7 children, grand children , relatives, and friends at [facility]      Barriers   Psychosocial barriers to participate in program  There are no identifiable barriers or psychosocial needs.;The patient should benefit from training in stress management and relaxation.      Screening Interventions   Interventions  Encouraged to exercise;To provide support and resources with identified psychosocial needs;Provide feedback about the scores to participant    Expected Outcomes  Short Term goal: Utilizing psychosocial counselor, staff and physician to assist with identification of specific Stressors or current issues interfering with healing process. Setting desired goal for each stressor or current issue identified.;Long Term Goal: Stressors or current issues are controlled or eliminated.;Short Term goal: Identification and review with participant of any Quality of Life or Depression concerns found by scoring the questionnaire.;Long Term goal: The participant improves quality of Life and PHQ9 Scores as seen by post scores and/or verbalization of changes       Quality of Life Scores:  Quality of Life - 06/04/17 1042      Quality of Life Scores   Health/Function Pre  22 %     Socioeconomic Pre  28.93 %    Psych/Spiritual Pre  29.14 %    Family Pre  30 %    GLOBAL Pre  26.33 %      Scores of 19 and below usually indicate a poorer quality of life in these areas.  A difference of  2-3 points is a clinically meaningful difference.  A difference of 2-3 points in the total score of the Quality of Life Index has been associated with significant improvement in overall quality of life, self-image, physical symptoms, and general health in studies assessing change in quality of life.  PHQ-9: Recent Review Flowsheet Data    Depression screen [facility] 2/9 06/04/2017 06/04/2017 05/03/2016   Decreased Interest - 1 0   Down, Depressed, Hopeless - 0 0   PHQ - 2 Score - 1 0   Altered sleeping (No Data)  3 -   Tired, decreased energy - 3 -   Change in appetite - 1 -   Feeling bad or failure about yourself  - 1 -   Trouble concentrating - 1 -   Moving slowly or fidgety/restless - 0 -   Suicidal thoughts - 0 -   PHQ-9 Score - 10 -   Difficult doing work/chores - Not difficult at all -     Interpretation of Total Score  Total Score Depression Severity:  1-4 = Minimal depression, 5-9 = Mild depression, 10-14 = Moderate depression, 15-19 = Moderately severe depression, 20-27 = Severe depression   Psychosocial Evaluation and Intervention:   Psychosocial Re-Evaluation:   Psychosocial Discharge (Final Psychosocial Re-Evaluation):   Vocational Rehabilitation: Provide vocational rehab assistance to qualifying candidates.   Vocational Rehab Evaluation & Intervention: Vocational Rehab - 06/04/17 1038      Initial Vocational Rehab Evaluation & Intervention   Assessment shows need for Vocational Rehabilitation  No       Education: Education Goals: Education classes will be provided on a variety of topics geared toward better understanding of heart health and risk factor modification. Participant will state understanding/return demonstration of topics presented as noted by  education test scores.  Learning Barriers/Preferences: Learning Barriers/Preferences - 06/04/17 1424      Learning Barriers/Preferences   Learning Barriers  Hearing Bilateral hearing aids    Learning Preferences  Individual  Instruction;Verbal Instruction;Group Instruction;Written Material       Education Topics:  AED/CPR: - Group verbal and written instruction with the use of models to demonstrate the basic use of the AED with the basic ABC's of resuscitation.   General Nutrition Guidelines/Fats and Fiber: -Group instruction provided by verbal, written material, models and posters to present the general guidelines for heart healthy nutrition. Gives an explanation and review of dietary fats and fiber.   Controlling Sodium/Reading Food Labels: -Group verbal and written material supporting the discussion of sodium use in heart healthy nutrition. Review and explanation with models, verbal and written materials for utilization of the food label.   Exercise Physiology & General Exercise Guidelines: - Group verbal and written instruction with models to review the exercise physiology of the cardiovascular system and associated critical values. Provides general exercise guidelines with specific guidelines to those with heart or lung disease.    Aerobic Exercise & Resistance Training: - Gives group verbal and written instruction on the various components of exercise. Focuses on aerobic and resistive training programs and the benefits of this training and how to safely progress through these programs..   Flexibility, Balance, Mind/Body Relaxation: Provides group verbal/written instruction on the benefits of flexibility and balance training, including mind/body exercise modes such as yoga, pilates and tai chi.  Demonstration and skill practice provided.   Stress and Anxiety: - Provides group verbal and written instruction about the health risks of elevated stress and causes of high stress.   Discuss the correlation between heart/lung disease and anxiety and treatment options. Review healthy ways to manage with stress and anxiety.   Depression: - Provides group verbal and written instruction on the correlation between heart/lung disease and depressed mood, treatment options, and the stigmas associated with seeking treatment.   Anatomy & Physiology of the Heart: - Group verbal and written instruction and models provide basic cardiac anatomy and physiology, with the coronary electrical and arterial systems. Review of Valvular disease and Heart Failure   Cardiac Procedures: - Group verbal and written instruction to review commonly prescribed medications for heart disease. Reviews the medication, class of the drug, and side effects. Includes the steps to properly store meds and maintain the prescription regimen. (beta blockers and nitrates)   Cardiac Medications I: - Group verbal and written instruction to review commonly prescribed medications for heart disease. Reviews the medication, class of the drug, and side effects. Includes the steps to properly store meds and maintain the prescription regimen.   Cardiac Medications II: -Group verbal and written instruction to review commonly prescribed medications for heart disease. Reviews the medication, class of the drug, and side effects. (all other drug classes)    Go Sex-Intimacy & Heart Disease, Get SMART - Goal Setting: - Group verbal and written instruction through game format to discuss heart disease and the return to sexual intimacy. Provides group verbal and written material to discuss and apply goal setting through the application of the S.M.A.R.T. Method.   Other Matters of the Heart: - Provides group verbal, written materials and models to describe Stable Angina and Peripheral Artery. Includes description of the disease process and treatment options available to the cardiac patient.   Exercise & Equipment Safety: -  Individual verbal instruction and demonstration of equipment use and safety with use of the equipment.   Cardiac Rehab from 06/04/2017 in [facility] Cardiac and Pulmonary Rehab  Date  06/04/17  Educator  [facility]  Instruction Review Code  1- Verbalizes Understanding  Infection Prevention: - Provides verbal and written material to individual with discussion of infection control including proper hand washing and proper equipment cleaning during exercise session.   Cardiac Rehab from 06/04/2017 in [facility] Cardiac and Pulmonary Rehab  Date  06/04/17  Educator  [facility]  Instruction Review Code  1- Verbalizes Understanding      Falls Prevention: - Provides verbal and written material to individual with discussion of falls prevention and safety.   Cardiac Rehab from 06/04/2017 in [facility] Cardiac and Pulmonary Rehab  Date  06/04/17  Educator  [facility]  Instruction Review Code  1- Verbalizes Understanding      Diabetes: - Individual verbal and written instruction to review signs/symptoms of diabetes, desired ranges of glucose level fasting, after meals and with exercise. Acknowledge that pre and post exercise glucose checks will be done for 3 sessions at entry of program.   Know Your Numbers and Risk Factors: -Group verbal and written instruction about important numbers in your health.  Discussion of what are risk factors and how they play a role in the disease process.  Review of Cholesterol, Blood Pressure, Diabetes, and BMI and the role they play in your overall health.   Sleep Hygiene: -Provides group verbal and written instruction about how sleep can affect your health.  Define sleep hygiene, discuss sleep cycles and impact of sleep habits. Review good sleep hygiene tips.    Other: -Provides group and verbal instruction on various topics (see comments)   Knowledge Questionnaire Score: Knowledge Questionnaire Score - 06/04/17 1425      Knowledge Questionnaire Score   Pre Score  28/28       Core  Components/Risk Factors/Patient Goals at Admission: Personal Goals and Risk Factors at Admission - 06/04/17 1404      Core Components/Risk Factors/Patient Goals on Admission   Improve shortness of breath with ADL's  Yes    Intervention  Provide education, individualized exercise plan and daily activity instruction to help decrease symptoms of SOB with activities of daily living.    Expected Outcomes  Short Term: Improve cardiorespiratory fitness to achieve a reduction of symptoms when performing ADLs;Long Term: Be able to perform more ADLs without symptoms or delay the onset of symptoms    Heart Failure  Yes    Intervention  Provide a combined exercise and nutrition program that is supplemented with education, support and counseling about heart failure. Directed toward relieving symptoms such as shortness of breath, decreased exercise tolerance, and extremity edema.    Expected Outcomes  Improve functional capacity of life;Short term: Attendance in program 2-3 days a week with increased exercise capacity. Reported lower sodium intake. Reported increased fruit and vegetable intake. Reports medication compliance.;Short term: Daily weights obtained and reported for increase. Utilizing diuretic protocols set by physician.;Long term: Adoption of self-care skills and reduction of barriers for early signs and symptoms recognition and intervention leading to self-care maintenance.    Hypertension  Yes    Intervention  Provide education on lifestyle modifcations including regular physical activity/exercise, weight management, moderate sodium restriction and increased consumption of fresh fruit, vegetables, and low fat dairy, alcohol moderation, and smoking cessation.;Monitor prescription use compliance.    Expected Outcomes  Short Term: Continued assessment and intervention until BP is < 140/32m HG in hypertensive participants. < 130/848mHG in hypertensive participants with diabetes, heart failure or chronic  kidney disease.;Long Term: Maintenance of blood pressure at goal levels.    Lipids  Yes    Intervention  Provide education and support  for participant on nutrition & aerobic/resistive exercise along with prescribed medications to achieve LDL <5m, HDL >469m    Expected Outcomes  Short Term: Participant states understanding of desired cholesterol values and is compliant with medications prescribed. Participant is following exercise prescription and nutrition guidelines.;Long Term: Cholesterol controlled with medications as prescribed, with individualized exercise RX and with personalized nutrition plan. Value goals: LDL < 7046mHDL > 40 mg.       Core Components/Risk Factors/Patient Goals Review:    Core Components/Risk Factors/Patient Goals at Discharge (Final Review):    ITP Comments: ITP Comments    Row Name 06/04/17 1048           ITP Comments  Medical review completed today. ITP sent to Dr MilSabra Heckr review, changes as needed and signature. Documentation of the diagnosis can be found in [facility] 04/30/2017          Comments: Initial ITP

## 2017-06-04 NOTE — Patient Instructions (Signed)
Patient Instructions  Patient Details  Name: Dennis Burns MRN: [id number] Date of Birth: 1928/09/22 Referring Provider:  Minna Merritts, MD  Below are your personal goals for exercise, nutrition, and risk factors. Our goal is to help you stay on track towards obtaining and maintaining these goals. We will be discussing your progress on these goals with you throughout the program.  Initial Exercise Prescription: Initial Exercise Prescription - 06/04/17 1500      Date of Initial Exercise RX and Referring Provider   Date  06/04/17    Referring Provider  Ida Rogue MD      Treadmill   MPH  1.3    Grade  0.5    Minutes  15    METs  2.09      NuStep   Level  2    SPM  80    Minutes  15    METs  2      Recumbant Elliptical   Level  1    RPM  50    Minutes  15    METs  2      Prescription Details   Frequency (times per week)  3    Duration  Progress to 45 minutes of aerobic exercise without signs/symptoms of physical distress      Intensity   THRR 40-80% of Max Heartrate  105-124    Ratings of Perceived Exertion  11-15    Perceived Dyspnea  0-4      Progression   Progression  Continue to progress workloads to maintain intensity without signs/symptoms of physical distress.      Resistance Training   Training Prescription  Yes    Weight  3 lbs    Reps  10-15       Exercise Goals: Frequency: Be able to perform aerobic exercise two to three times per week in program working toward 2-5 days per week of home exercise.  Intensity: Work with a perceived exertion of 11 (fairly light) - 15 (hard) while following your exercise prescription.  We will make changes to your prescription with you as you progress through the program.   Duration: Be able to do 30 to 45 minutes of continuous aerobic exercise in addition to a 5 minute warm-up and a 5 minute cool-down routine.   Nutrition Goals: Your personal nutrition goals will be established when you do your nutrition  analysis with the dietician.  The following are general nutrition guidelines to follow: Cholesterol < 200mg /day Sodium < 1500mg /day Fiber: Men over 50 yrs - 30 grams per day  Personal Goals: Personal Goals and Risk Factors at Admission - 06/04/17 1404      Core Components/Risk Factors/Patient Goals on Admission   Improve shortness of breath with ADL's  Yes    Intervention  Provide education, individualized exercise plan and daily activity instruction to help decrease symptoms of SOB with activities of daily living.    Expected Outcomes  Short Term: Improve cardiorespiratory fitness to achieve a reduction of symptoms when performing ADLs;Long Term: Be able to perform more ADLs without symptoms or delay the onset of symptoms    Heart Failure  Yes    Intervention  Provide a combined exercise and nutrition program that is supplemented with education, support and counseling about heart failure. Directed toward relieving symptoms such as shortness of breath, decreased exercise tolerance, and extremity edema.    Expected Outcomes  Improve functional capacity of life;Short term: Attendance in program 2-3 days a week with increased  exercise capacity. Reported lower sodium intake. Reported increased fruit and vegetable intake. Reports medication compliance.;Short term: Daily weights obtained and reported for increase. Utilizing diuretic protocols set by physician.;Long term: Adoption of self-care skills and reduction of barriers for early signs and symptoms recognition and intervention leading to self-care maintenance.    Hypertension  Yes    Intervention  Provide education on lifestyle modifcations including regular physical activity/exercise, weight management, moderate sodium restriction and increased consumption of fresh fruit, vegetables, and low fat dairy, alcohol moderation, and smoking cessation.;Monitor prescription use compliance.    Expected Outcomes  Short Term: Continued assessment and  intervention until BP is < 140/40mm HG in hypertensive participants. < 130/65mm HG in hypertensive participants with diabetes, heart failure or chronic kidney disease.;Long Term: Maintenance of blood pressure at goal levels.    Lipids  Yes    Intervention  Provide education and support for participant on nutrition & aerobic/resistive exercise along with prescribed medications to achieve LDL 70mg , HDL >40mg .    Expected Outcomes  Short Term: Participant states understanding of desired cholesterol values and is compliant with medications prescribed. Participant is following exercise prescription and nutrition guidelines.;Long Term: Cholesterol controlled with medications as prescribed, with individualized exercise RX and with personalized nutrition plan. Value goals: LDL < 70mg , HDL > 40 mg.       Tobacco Use Initial Evaluation: Social History   Tobacco Use  Smoking Status Former Smoker  . Packs/day: 0.25  . Years: 18.00  . Pack years: 4.50  . Last attempt to quit: 1978  . Years since quitting: 41.2  Smokeless Tobacco Never Used  Tobacco Comment   Quit in 1978    Exercise Goals and Review: Exercise Goals    Row Name 06/04/17 1519             Exercise Goals   Increase Physical Activity  Yes       Intervention  Develop an individualized exercise prescription for aerobic and resistive training based on initial evaluation findings, risk stratification, comorbidities and participant's personal goals.;Provide advice, education, support and counseling about physical activity/exercise needs.       Expected Outcomes  Short Term: Attend rehab on a regular basis to increase amount of physical activity.;Long Term: Add in home exercise to make exercise part of routine and to increase amount of physical activity.;Long Term: Exercising regularly at least 3-5 days a week.       Increase Strength and Stamina  Yes       Intervention  Provide advice, education, support and counseling about physical  activity/exercise needs.;Develop an individualized exercise prescription for aerobic and resistive training based on initial evaluation findings, risk stratification, comorbidities and participant's personal goals.       Expected Outcomes  Short Term: Increase workloads from initial exercise prescription for resistance, speed, and METs.;Short Term: Perform resistance training exercises routinely during rehab and add in resistance training at home;Long Term: Improve cardiorespiratory fitness, muscular endurance and strength as measured by increased METs and functional capacity (6MWT)       Able to understand and use rate of perceived exertion (RPE) scale  Yes       Intervention  Provide education and explanation on how to use RPE scale       Expected Outcomes  Short Term: Able to use RPE daily in rehab to express subjective intensity level;Long Term:  Able to use RPE to guide intensity level when exercising independently       Able to understand and use  Dyspnea scale  Yes       Intervention  Provide education and explanation on how to use Dyspnea scale       Expected Outcomes  Short Term: Able to use Dyspnea scale daily in rehab to express subjective sense of shortness of breath during exertion;Long Term: Able to use Dyspnea scale to guide intensity level when exercising independently       Knowledge and understanding of Target Heart Rate Range (THRR)  Yes       Intervention  Provide education and explanation of THRR including how the numbers were predicted and where they are located for reference       Expected Outcomes  Short Term: Able to state/look up THRR;Short Term: Able to use daily as guideline for intensity in rehab;Long Term: Able to use THRR to govern intensity when exercising independently       Able to check pulse independently  Yes       Intervention  Provide education and demonstration on how to check pulse in carotid and radial arteries.;Review the importance of being able to check your  own pulse for safety during independent exercise       Expected Outcomes  Short Term: Able to explain why pulse checking is important during independent exercise;Long Term: Able to check pulse independently and accurately       Understanding of Exercise Prescription  Yes       Intervention  Provide education, explanation, and written materials on patient's individual exercise prescription       Expected Outcomes  Short Term: Able to explain program exercise prescription;Long Term: Able to explain home exercise prescription to exercise independently          Copy of goals given to participant.

## 2017-06-05 ENCOUNTER — Encounter: Payer: Self-pay | Admitting: Cardiovascular Disease

## 2017-06-07 ENCOUNTER — Encounter: Payer: Medicare PPO | Admitting: *Deleted

## 2017-06-07 DIAGNOSIS — I214 Non-ST elevation (NSTEMI) myocardial infarction: Secondary | ICD-10-CM | POA: Diagnosis not present

## 2017-06-07 NOTE — Progress Notes (Signed)
Daily Session Note  Patient Details  Name: Dennis Burns MRN: [id number] Date of Birth: [birth year] Referring Provider:     Cardiac Rehab from 06/04/2017 in [facility] Cardiac and Pulmonary Rehab  Referring Provider  Ida Rogue MD      Encounter Date: 06/07/2017  Check In: Session Check In - 06/07/17 1653      Check-In   Location  [facility]-Cardiac & Pulmonary Rehab    Staff Present  Earlean Shawl, BS, ACSM CEP, Exercise Physiologist;Mary Kellie Shropshire, RN, BSN, Donavan Foil, RN, BSN, CCRP    Supervising physician immediately available to respond to emergencies  See telemetry face sheet for immediately available ER MD    Medication changes reported      No    Fall or balance concerns reported     No    Warm-up and Cool-down  Performed on first and last piece of equipment    Resistance Training Performed  Yes    VAD Patient?  No      Pain Assessment   Currently in Pain?  No/denies    Multiple Pain Sites  No          Social History   Tobacco Use  Smoking Status Former Smoker  . Packs/day: 0.25  . Years: 18.00  . Pack years: 4.50  . Last attempt to quit: 1978  . Years since quitting: 41.2  Smokeless Tobacco Never Used  Tobacco Comment   Quit in 1978    Goals Met:  Exercise tolerated well Personal goals reviewed No report of cardiac concerns or symptoms Strength training completed today  Goals Unmet:  Not Applicable  Comments: First full day of exercise!  Patient was oriented to gym and equipment including functions, settings, policies, and procedures.  Patient's individual exercise prescription and treatment plan were reviewed.  All starting workloads were established based on the results of the 6 minute walk test done at initial orientation visit.  The plan for exercise progression was also introduced and progression will be customized based on patient's performance and goals.    Dr. Emily Filbert is Medical Director for [place] and [facility]  Pulmonary [facility].

## 2017-06-11 ENCOUNTER — Other Ambulatory Visit: Payer: Self-pay | Admitting: *Deleted

## 2017-06-11 DIAGNOSIS — I214 Non-ST elevation (NSTEMI) myocardial infarction: Secondary | ICD-10-CM

## 2017-06-11 MED ORDER — METOLAZONE 2.5 MG PO TABS: 2.5000 mg | ORAL_TABLET | ORAL | 0 refills | Status: DC

## 2017-06-11 MED ORDER — FUROSEMIDE 40 MG PO TABS: 60.0000 mg | ORAL_TABLET | Freq: Two times a day (BID) | ORAL | 3 refills | Status: DC

## 2017-06-11 NOTE — Progress Notes (Signed)
Daily Session Note  Patient Details  Name: Dennis Burns MRN: [id number] Date of Birth: [birth year] Referring Provider:     Cardiac Rehab from 06/04/2017 in [facility] Cardiac and Pulmonary Rehab  Referring Provider  Ida Rogue MD      Encounter Date: 06/11/2017  Check In: Session Check In - 06/11/17 1712      Check-In   Location  [facility]-Cardiac & Pulmonary Rehab    Staff Present  Nada Maclachlan, BA, ACSM CEP, Exercise Physiologist;Kelly Amedeo Plenty, BS, ACSM CEP, Exercise Physiologist;Carroll Enterkin, RN, BSN    Supervising physician immediately available to respond to emergencies  See telemetry face sheet for immediately available ER MD    Medication changes reported      No    Fall or balance concerns reported     No    Warm-up and Cool-down  Performed on first and last piece of equipment    Resistance Training Performed  Yes    VAD Patient?  No      Pain Assessment   Currently in Pain?  No/denies    Multiple Pain Sites  No          Social History   Tobacco Use  Smoking Status Former Smoker  . Packs/day: 0.25  . Years: 18.00  . Pack years: 4.50  . Last attempt to quit: 1978  . Years since quitting: 41.2  Smokeless Tobacco Never Used  Tobacco Comment   Quit in 1978    Goals Met:  Independence with exercise equipment Exercise tolerated well No report of cardiac concerns or symptoms Strength training completed today  Goals Unmet:  Not Applicable  Comments: Pt able to follow exercise prescription today without complaint.  Will continue to monitor for progression.    Dr. Emily Filbert is Medical Director for [name].

## 2017-06-12 ENCOUNTER — Other Ambulatory Visit: Payer: Self-pay | Admitting: *Deleted

## 2017-06-12 MED ORDER — POTASSIUM CHLORIDE ER 10 MEQ PO TBCR: 20.0000 meq | EXTENDED_RELEASE_TABLET | Freq: Every day | ORAL | 0 refills | Status: DC

## 2017-06-12 NOTE — Progress Notes (Signed)
Labs before next visit would be great

## 2017-06-13 ENCOUNTER — Encounter: Payer: Medicare PPO | Admitting: *Deleted

## 2017-06-13 DIAGNOSIS — I214 Non-ST elevation (NSTEMI) myocardial infarction: Secondary | ICD-10-CM

## 2017-06-13 NOTE — Progress Notes (Signed)
Daily Session Note  Patient Details  Name: Augustus Zurawski MRN: [id number] Date of Birth: 1929/01/03 Referring Provider:     Cardiac Rehab from 06/04/2017 in [facility] Cardiac and Pulmonary Rehab  Referring Provider  Ida Rogue MD      Encounter Date: 06/13/2017  Check In: Session Check In - 06/13/17 1654      Check-In   Location  [facility]-Cardiac & Pulmonary Rehab    Staff Present  Renita Papa, RN Vickki Hearing, BA, ACSM CEP, Exercise Physiologist;Carroll Enterkin, RN, BSN Jacquelynn Cree Addai RN    Supervising physician immediately available to respond to emergencies  See telemetry face sheet for immediately available ER MD    Medication changes reported      No    Fall or balance concerns reported     No    Warm-up and Cool-down  Performed on first and last piece of equipment    Resistance Training Performed  Yes    VAD Patient?  No      Pain Assessment   Currently in Pain?  No/denies          Social History   Tobacco Use  Smoking Status Former Smoker  . Packs/day: 0.25  . Years: 18.00  . Pack years: 4.50  . Last attempt to quit: 1978  . Years since quitting: 41.2  Smokeless Tobacco Never Used  Tobacco Comment   Quit in 1978    Goals Met:  Independence with exercise equipment Exercise tolerated well No report of cardiac concerns or symptoms Strength training completed today  Goals Unmet:  Not Applicable  Comments: Pt able to follow exercise prescription today without complaint.  Will continue to monitor for progression.    Dr. Emily Filbert is Medical Director for [place] and [facility].

## 2017-06-14 DIAGNOSIS — I214 Non-ST elevation (NSTEMI) myocardial infarction: Secondary | ICD-10-CM | POA: Diagnosis not present

## 2017-06-14 NOTE — Progress Notes (Signed)
Daily Session Note  Patient Details  Name: Dennis Burns MRN: [id number] Date of Birth: 01/11/1929 Referring Provider:     Cardiac Rehab from 06/04/2017 in [facility] Cardiac and Pulmonary Rehab  Referring Provider  Gollan, Timothy MD      Encounter Date: 06/14/2017  Check In: Session Check In - 06/14/17 1702      Check-In   Location  [facility]-Cardiac & Pulmonary Rehab    Staff Present  Amanda Sommer, BA, ACSM CEP, Exercise Physiologist;Meredith Craven, RN BSN;  RCP,RRT,BSRT    Supervising physician immediately available to respond to emergencies  See telemetry face sheet for immediately available ER MD    Medication changes reported      No    Fall or balance concerns reported     No    Tobacco Cessation  No Change    Warm-up and Cool-down  Performed on first and last piece of equipment    Resistance Training Performed  Yes    VAD Patient?  No      Pain Assessment   Currently in Pain?  No/denies          Social History   Tobacco Use  Smoking Status Former Smoker  . Packs/day: 0.25  . Years: 18.00  . Pack years: 4.50  . Last attempt to quit: 1978  . Years since quitting: 41.3  Smokeless Tobacco Never Used  Tobacco Comment   Quit in 1978    Goals Met:  Independence with exercise equipment Exercise tolerated well No report of cardiac concerns or symptoms Strength training completed today  Goals Unmet:  Not Applicable  Comments: Pt able to follow exercise prescription today without complaint.  Will continue to monitor for progression.   Dr. Mark Miller is Medical Director for [facility] and [facility]. 

## 2017-06-18 DIAGNOSIS — I214 Non-ST elevation (NSTEMI) myocardial infarction: Secondary | ICD-10-CM | POA: Diagnosis not present

## 2017-06-18 NOTE — Progress Notes (Signed)
Daily Session Note  Patient Details  Name: Dennis Burns MRN: [id number] Date of Birth: 1929-01-20 Referring Provider:     Cardiac Rehab from 06/04/2017 in [facility] Cardiac and Pulmonary Rehab  Referring Provider  Ida Rogue MD      Encounter Date: 06/18/2017  Check In: Session Check In - 06/18/17 1731      Check-In   Location  [facility]-Cardiac & Pulmonary Rehab    Staff Present  Earlean Shawl, BS, ACSM CEP, Exercise Physiologist;Alyssabeth Bruster Oletta Darter, BA, ACSM CEP, Exercise Physiologist;Carroll Enterkin, RN, BSN    Supervising physician immediately available to respond to emergencies  See telemetry face sheet for immediately available ER MD    Medication changes reported      No    Fall or balance concerns reported     No    Warm-up and Cool-down  Performed on first and last piece of equipment    Resistance Training Performed  Yes    VAD Patient?  No      Pain Assessment   Currently in Pain?  No/denies    Multiple Pain Sites  No          Social History   Tobacco Use  Smoking Status Former Smoker  . Packs/day: 0.25  . Years: 18.00  . Pack years: 4.50  . Last attempt to quit: 1978  . Years since quitting: 41.3  Smokeless Tobacco Never Used  Tobacco Comment   Quit in 1978    Goals Met:  Independence with exercise equipment Exercise tolerated well No report of cardiac concerns or symptoms Strength training completed today  Goals Unmet:  Not Applicable  Comments: Pt able to follow exercise prescription today without complaint.  Will continue to monitor for progression.    Dr. Emily Filbert is Medical Director for [place] and [facility].

## 2017-06-20 ENCOUNTER — Encounter: Payer: Medicare PPO | Admitting: *Deleted

## 2017-06-20 DIAGNOSIS — I214 Non-ST elevation (NSTEMI) myocardial infarction: Secondary | ICD-10-CM

## 2017-06-20 NOTE — Progress Notes (Signed)
Daily Session Note  Patient Details  Name: Dennis Burns MRN: [id number] Date of Birth: January 29, 1929 Referring Provider:     Cardiac Rehab from 06/04/2017 in [facility] Cardiac and Pulmonary Rehab  Referring Provider  Ida Rogue MD      Encounter Date: 06/20/2017  Check In: Session Check In - 06/20/17 1646      Check-In   Location  [facility]-Cardiac & Pulmonary Rehab    Staff Present  Renita Papa, RN Vickki Hearing, BA, ACSM CEP, Exercise Physiologist;Carroll Enterkin, RN, BSN    Supervising physician immediately available to respond to emergencies  See telemetry face sheet for immediately available ER MD    Medication changes reported      No    Fall or balance concerns reported     No    Warm-up and Cool-down  Performed on first and last piece of equipment    Resistance Training Performed  Yes    VAD Patient?  No      Pain Assessment   Currently in Pain?  No/denies        Exercise Prescription Changes - 06/20/17 1200      Response to Exercise   Blood Pressure (Admit)  142/70    Blood Pressure (Exercise)  140/62    Blood Pressure (Exit)  146/62    Heart Rate (Admit)  80 bpm    Heart Rate (Exercise)  117 bpm    Heart Rate (Exit)  87 bpm    Rating of Perceived Exertion (Exercise)  15    Symptoms  none    Duration  Progress to 45 minutes of aerobic exercise without signs/symptoms of physical distress    Intensity  THRR unchanged      Progression   Progression  Continue to progress workloads to maintain intensity without signs/symptoms of physical distress.    Average METs  2.2      Resistance Training   Training Prescription  Yes    Weight  3 lbs    Reps  10-15      Interval Training   Interval Training  No      Treadmill   MPH  1.3    Grade  0.5    Minutes  15    METs  2.17      NuStep   Level  1    SPM  80    Minutes  15    METs  2.2       Social History   Tobacco Use  Smoking Status Former Smoker  . Packs/day: 0.25  . Years: 18.00  . Pack  years: 4.50  . Last attempt to quit: 1978  . Years since quitting: 41.3  Smokeless Tobacco Never Used  Tobacco Comment   Quit in 1978    Goals Met:  Independence with exercise equipment Exercise tolerated well No report of cardiac concerns or symptoms Strength training completed today  Goals Unmet:  Not Applicable  Comments: Pt able to follow exercise prescription today without complaint.  Will continue to monitor for progression.    Dr. Emily Filbert is Medical Director for [place] and [facility].

## 2017-06-20 NOTE — Progress Notes (Signed)
Left voicemail message for patient to call back regarding repeat labs at a later date prior to his next appointment.

## 2017-06-21 ENCOUNTER — Encounter: Payer: Medicare PPO | Admitting: *Deleted

## 2017-06-21 ENCOUNTER — Encounter: Payer: Self-pay | Admitting: Dietician

## 2017-06-21 DIAGNOSIS — I214 Non-ST elevation (NSTEMI) myocardial infarction: Secondary | ICD-10-CM

## 2017-06-21 NOTE — Progress Notes (Signed)
Daily Session Note  Patient Details  Name: Gurnoor Ursua MRN: [id number] Date of Birth: 1928-12-07 Referring Provider:     Cardiac Rehab from 06/04/2017 in [facility] Cardiac and Pulmonary Rehab  Referring Provider  Ida Rogue MD      Encounter Date: 06/21/2017  Check In: Session Check In - 06/21/17 1713      Check-In   Location  [facility]-Cardiac & Pulmonary Rehab    Staff Present  Earlean Shawl, BS, ACSM CEP, Exercise Physiologist;Laureen Owens Shark, BS, RRT, Respiratory Therapist;Meredith Sherryll Burger, RN BSN    Supervising physician immediately available to respond to emergencies  See telemetry face sheet for immediately available ER MD    Medication changes reported      No    Fall or balance concerns reported     No    Warm-up and Cool-down  Performed on first and last piece of equipment    Resistance Training Performed  Yes    VAD Patient?  No      Pain Assessment   Currently in Pain?  No/denies    Multiple Pain Sites  No          Social History   Tobacco Use  Smoking Status Former Smoker  . Packs/day: 0.25  . Years: 18.00  . Pack years: 4.50  . Last attempt to quit: 1978  . Years since quitting: 41.3  Smokeless Tobacco Never Used  Tobacco Comment   Quit in 1978    Goals Met:  Independence with exercise equipment Exercise tolerated well No report of cardiac concerns or symptoms Strength training completed today  Goals Unmet:  Not Applicable  Comments: Pt able to follow exercise prescription today without complaint.  Will continue to monitor for progression. The REL was too difficult for Gene and bothered his knees so he was switched to the BioStep machine for that exercise station.    Dr. Emily Filbert is Medical Director for [place] and [facility].

## 2017-06-25 ENCOUNTER — Encounter: Payer: Medicare PPO | Admitting: *Deleted

## 2017-06-25 ENCOUNTER — Encounter: Payer: Self-pay | Admitting: *Deleted

## 2017-06-25 DIAGNOSIS — I214 Non-ST elevation (NSTEMI) myocardial infarction: Secondary | ICD-10-CM

## 2017-06-25 NOTE — Progress Notes (Signed)
Daily Session Note  Patient Details  Name: Dennis Burns MRN: [id number] Date of Birth: [birth year] Referring Provider:     Cardiac Rehab from 06/04/2017 in [facility]  Referring Provider  Ida Rogue MD      Encounter Date: 06/25/2017  Check In: Session Check In - 06/25/17 1728      Check-In   Location  [facility] Constance Goltz, RN     Staff Present  Renita Papa, RN Moises Blood, BS, ACSM CEP, Exercise Physiologist;Satcha Storlie, RN, BSN    Supervising physician immediately available to respond to emergencies  See telemetry face sheet for immediately available ER MD    Medication changes reported      No    Fall or balance concerns reported     No    Tobacco Cessation  No Change    Warm-up and Cool-down  Performed on first and last piece of equipment    Resistance Training Performed  Yes    VAD Patient?  No      Pain Assessment   Currently in Pain?  No/denies          Social History   Tobacco Use  Smoking Status Former Smoker  . Packs/day: 0.25  . Years: 18.00  . Pack years: 4.50  . Last attempt to quit: 1978  . Years since quitting: 41.3  Smokeless Tobacco Never Used  Tobacco Comment   Quit in 1978    Goals Met:  Proper associated with RPD/PD & O2 Sat Exercise tolerated well No report of cardiac concerns or symptoms Strength training completed today  Goals Unmet:  Not Applicable  Comments:     Dr. Emily Filbert is Medical Director for [facility].

## 2017-06-26 ENCOUNTER — Telehealth: Payer: Self-pay | Admitting: Cardiovascular Disease

## 2017-06-26 ENCOUNTER — Other Ambulatory Visit
Admission: RE | Admit: 2017-06-26 | Discharge: 2017-06-26 | Disposition: A | Discharge status: Home / Self Care | Payer: Medicare PPO | Source: Ambulatory Visit | Attending: Cardiovascular Disease | Admitting: Cardiovascular Disease

## 2017-06-26 DIAGNOSIS — I25118 Atherosclerotic heart disease of native coronary artery with other forms of angina pectoris: Secondary | ICD-10-CM

## 2017-06-26 LAB — CBC WITH DIFFERENTIAL/PLATELET
BASOS PCT: 1 %
Basophils Absolute: 0.1 10*3/uL (ref 0–0.1)
EOS PCT: 1 %
Eosinophils Absolute: 0 10*3/uL (ref 0–0.7)
HEMATOCRIT: 36.8 % — AB (ref 40.0–52.0)
Hemoglobin: 12 g/dL — ABNORMAL LOW (ref 13.0–18.0)
Lymphocytes Relative: 14 %
Lymphs Abs: 1.2 10*3/uL (ref 1.0–3.6)
MCH: 26.4 pg (ref 26.0–34.0)
MCHC: 32.5 g/dL (ref 32.0–36.0)
MCV: 81.3 fL (ref 80.0–100.0)
MONO ABS: 0.9 10*3/uL (ref 0.2–1.0)
MONOS PCT: 11 %
Neutro Abs: 6.2 10*3/uL (ref 1.4–6.5)
Neutrophils Relative %: 73 %
PLATELETS: 222 10*3/uL (ref 150–440)
RBC: 4.53 MIL/uL (ref 4.40–5.90)
RDW: 18.9 % — AB (ref 11.5–14.5)
WBC: 8.5 10*3/uL (ref 3.8–10.6)

## 2017-06-26 LAB — BASIC METABOLIC PANEL
ANION GAP: 10 (ref 5–15)
BUN: 59 mg/dL — ABNORMAL HIGH (ref 6–20)
CALCIUM: 9 mg/dL (ref 8.9–10.3)
CO2: 26 mmol/L (ref 22–32)
Chloride: 102 mmol/L (ref 101–111)
Creatinine, Ser: 1.28 mg/dL — ABNORMAL HIGH (ref 0.61–1.24)
GFR, EST AFRICAN AMERICAN: 57 mL/min — AB (ref >60)
GFR, EST NON AFRICAN AMERICAN: 49 mL/min — AB (ref >60)
GLUCOSE: 147 mg/dL — AB (ref 65–99)
Potassium: 3.4 mmol/L — ABNORMAL LOW (ref 3.5–5.1)
Sodium: 138 mmol/L (ref 135–145)

## 2017-06-26 NOTE — Telephone Encounter (Signed)
Pt scheduled for May 7th

## 2017-06-26 NOTE — Telephone Encounter (Signed)
Spoke with patients wife per release form and advised for her to have him go to [facility] Entrance to have labs done. She reports that Dennis Burns has been very weak and not feeling well and would like to have appointment with Dr. [name]. Advised that I would have scheduling call and assist with getting appointment. She was appreciative for the call back and had no further questions at this time.

## 2017-06-26 NOTE — Telephone Encounter (Signed)
Pt wife calling asking for a call back Would like to make sure the labs we ordered are the correct ones.  Would like a call back

## 2017-06-27 ENCOUNTER — Encounter: Payer: Self-pay | Admitting: *Deleted

## 2017-06-27 DIAGNOSIS — I214 Non-ST elevation (NSTEMI) myocardial infarction: Secondary | ICD-10-CM | POA: Diagnosis not present

## 2017-06-27 NOTE — Progress Notes (Signed)
Cardiac Individual Treatment Plan  Patient Details  Name: Dennis Burns MRN: [id number] Date of Birth: 12-28-1928 Referring Provider:     Cardiac Rehab from 06/04/2017 in [facility] Cardiac and Pulmonary Rehab  Referring Provider  Dennis Rogue MD      Initial Encounter Date:    Cardiac Rehab from 06/04/2017 in [facility] Cardiac and Pulmonary Rehab  Date  06/04/17  Referring Provider  Dennis Rogue MD      Visit Diagnosis: NSTEMI (non-ST elevated myocardial infarction) [name])  Patient's Home Medications on Admission:  Current Outpatient Medications:  .  Alpha-Lipoic Acid 200 MG CAPS, Take 1 capsule by mouth daily., Disp: , Rfl:  .  apixaban (ELIQUIS) 2.5 MG TABS tablet, Take 1 tablet (2.5 mg total) by mouth 2 (two) times daily., Disp: 180 tablet, Rfl: 3 .  atorvastatin (LIPITOR) 40 MG tablet, Take 40 mg by mouth daily., Disp: , Rfl:  .  CALCIUM CITRATE PO, Take 1 tablet by mouth daily. Takes '300mg'$  daily., Disp: , Rfl:  .  carvedilol (COREG) 12.5 MG tablet, Take 1 tablet (12.5 mg total) by mouth 2 (two) times daily with a meal., Disp: 60 tablet, Rfl: 0 .  Cholecalciferol (VITAMIN D) 2000 units CAPS, Take 4,000 Units by mouth daily., Disp: , Rfl:  .  Coenzyme Q10 (CO Q10) 200 MG CAPS, Take 1 capsule by mouth daily., Disp: , Rfl:  .  CRANBERRY EXTRACT PO, Take 1 tablet by mouth daily. Takes '1500mg'$  daily, Disp: , Rfl:  .  ezetimibe (ZETIA) 10 MG tablet, Take 1 tablet (10 mg total) by mouth daily., Disp: 90 tablet, Rfl: 4 .  feeding supplement, ENSURE ENLIVE, (ENSURE ENLIVE) LIQD, Take 237 mLs by mouth 2 (two) times daily between meals., Disp: 237 mL, Rfl: 12 .  furosemide (LASIX) 40 MG tablet, Take 1.5 tablets (60 mg total) by mouth 2 (two) times daily., Disp: 135 tablet, Rfl: 3 .  hydrALAZINE (APRESOLINE) 10 MG tablet, Take 1 tablet (10 mg total) by mouth 3 (three) times daily., Disp: 270 tablet, Rfl: 3 .  isosorbide mononitrate (IMDUR) 30 MG 24 hr tablet, Take 2 tablets (60 mg total) by mouth  daily., Disp: 90 tablet, Rfl: 3 .  latanoprost (XALATAN) 0.005 % ophthalmic solution, Place 1 drop into both eyes at bedtime. In morning, Disp: , Rfl:  .  levothyroxine (SYNTHROID, LEVOTHROID) 75 MCG tablet, Take 1 tablet (75 mcg total) by mouth daily., Disp: 90 tablet, Rfl: 3 .  metolazone (ZAROXOLYN) 2.5 MG tablet, Take 1 tablet (2.5 mg total) by mouth as directed. Twice a week as needed, Disp: 8 tablet, Rfl: 0 .  Multiple Vitamin (MULTIVITAMIN) tablet, Take 1 tablet by mouth daily., Disp: , Rfl:  .  nitroGLYCERIN (NITROSTAT) 0.4 MG SL tablet, Place 1 tablet (0.4 mg total) under the tongue every 5 (five) minutes as needed for chest pain., Disp: , Rfl: 12 .  Omega 3-6-9 Fatty Acids (OMEGA-3-6-9 PO), Take 1 capsule by mouth 2 (two) times daily., Disp: , Rfl:  .  potassium chloride (K-DUR) 10 MEQ tablet, Take 2 tablets (20 mEq total) by mouth daily., Disp: 180 tablet, Rfl: 0 .  timolol (TIMOPTIC) 0.25 % ophthalmic solution, Place 1 drop into both eyes 2 (two) times daily. am, Disp: , Rfl:  .  traMADol (ULTRAM) 50 MG tablet, Take 2 tablets (100 mg total) by mouth 3 (three) times daily as needed., Disp: 30 tablet, Rfl: 2  Past Medical History: Past Medical History:  Diagnosis Date  . Allergic rhinitis due to pollen  as child--better now  . Chronic kidney disease, stage III (moderate) (HCC)   . Coronary artery disease   . DVT, lower extremity, recurrent (Dennis Burns)   . Glaucoma    Dennis Burns - [facility]   . History of prostate cancer 2004  . Hyperlipidemia   . Hypertension   . Hypothyroidism   . Impaired fasting glucose   . Spinal stenosis of lumbar region with radiculopathy     Tobacco Use: Social History   Tobacco Use  Smoking Status Former Smoker  . Packs/day: 0.25  . Years: 18.00  . Pack years: 4.50  . Last attempt to quit: 1978  . Years since quitting: 41.3  Smokeless Tobacco Never Used  Tobacco Comment   Quit in 1978    Labs: Recent Review Flowsheet Data    Labs for ITP Cardiac and  Pulmonary Rehab Latest Ref Rng & Units 02/07/2016 02/14/2017 02/14/2017 05/01/2017   Cholestrol 0 - 200 mg/dL 136 - - 114   LDLCALC 0 - 99 mg/dL 64 - - 58   HDL >40 mg/dL 54 - - 34(L)   Trlycerides <150 mg/dL 92 - - 109   PHART 7.350 - 7.450 - - 7.386 7.44   PCO2ART 32.0 - 48.0 mmHg - - 33.9 29(L)   HCO3 20.0 - 28.0 mmol/L - 18.6(L) 20.3 19.7(L)   TCO2 22 - 32 mmol/L - 20(L) 21(L) -   ACIDBASEDEF 0.0 - 2.0 mmol/L - 5.0(H) 4.0(H) 3.2(H)   O2SAT % - 70.0 92.0 92.3       Exercise Target Goals:    Exercise Program Goal: Individual exercise prescription set using results from initial 6 min walk test and THRR while considering  patient's activity barriers and safety.   Exercise Prescription Goal: Initial exercise prescription builds to 30-45 minutes a day of aerobic activity, 2-3 days per week.  Home exercise guidelines will be given to patient during program as part of exercise prescription that the participant will acknowledge.  Activity Barriers & Risk Stratification: Activity Barriers & Cardiac Risk Stratification - 06/04/17 1400      Activity Barriers & Cardiac Risk Stratification   Activity Barriers  -- Chronic back and neck pain: controls with exercise and tramadol as needed.  2 spinal fusions Was very active       6 Minute Walk: 6 Minute Walk    Row Name 06/04/17 1515         6 Minute Walk   Phase  Initial     Distance  1030 feet     Walk Time  6 minutes     # of Rest Breaks  0     MPH  1.95     METS  1.98     RPE  15     Perceived Dyspnea   2     VO2 Peak  6.91     Symptoms  Yes (comment)     Comments  SOB     Resting HR  86 bpm     Resting BP  130/64     Resting Oxygen Saturation   98 %     Exercise Oxygen Saturation  during 6 min walk  96 %     Max Ex. HR  118 bpm     Max Ex. BP  144/66     2 Minute Post BP  124/62       Interval HR   1 Minute HR  112     2 Minute HR  117  3 Minute HR  119     4 Minute HR  117     5 Minute HR  119     6 Minute HR   119     2 Minute Post HR  97     Interval Heart Rate?  Yes       Interval Oxygen   Interval Oxygen?  Yes     Baseline Oxygen Saturation %  98 %     1 Minute Oxygen Saturation %  96 %     1 Minute Liters of Oxygen  0 L Room Air     2 Minute Oxygen Saturation %  97 %     2 Minute Liters of Oxygen  0 L     3 Minute Oxygen Saturation %  98 %     3 Minute Liters of Oxygen  0 L     4 Minute Oxygen Saturation %  98 %     4 Minute Liters of Oxygen  0 L     5 Minute Oxygen Saturation %  98 %     5 Minute Liters of Oxygen  0 L     6 Minute Oxygen Saturation %  96 %     6 Minute Liters of Oxygen  0 L     2 Minute Post Oxygen Saturation %  96 %     2 Minute Post Liters of Oxygen  0 L        Oxygen Initial Assessment:   Oxygen Re-Evaluation:   Oxygen Discharge (Final Oxygen Re-Evaluation):   Initial Exercise Prescription: Initial Exercise Prescription - 06/04/17 1500      Date of Initial Exercise RX and Referring Provider   Date  06/04/17    Referring Provider  Dennis Rogue MD      Treadmill   MPH  1.3    Grade  0.5    Minutes  15    METs  2.09      NuStep   Level  2    SPM  80    Minutes  15    METs  2      Recumbant Elliptical   Level  1    RPM  50    Minutes  15    METs  2      Prescription Details   Frequency (times per week)  3    Duration  Progress to 45 minutes of aerobic exercise without signs/symptoms of physical distress      Intensity   THRR 40-80% of Max Heartrate  105-124    Ratings of Perceived Exertion  11-15    Perceived Dyspnea  0-4      Progression   Progression  Continue to progress workloads to maintain intensity without signs/symptoms of physical distress.      Resistance Training   Training Prescription  Yes    Weight  3 lbs    Reps  10-15       Perform Capillary Blood Glucose checks as needed.  Exercise Prescription Changes: Exercise Prescription Changes    Row Name 06/04/17 1300 06/20/17 1200           Response to  Exercise   Blood Pressure (Admit)  130/64  142/70      Blood Pressure (Exercise)  144/66  140/62      Blood Pressure (Exit)  124/62  146/62      Heart Rate (Admit)  86 bpm  80 bpm  Heart Rate (Exercise)  118 bpm  117 bpm      Heart Rate (Exit)  97 bpm  87 bpm      Oxygen Saturation (Admit)  98 %  -      Oxygen Saturation (Exercise)  96 %  -      Oxygen Saturation (Exit)  96 %  -      Rating of Perceived Exertion (Exercise)  15  15      Perceived Dyspnea (Exercise)  2  -      Symptoms  SOB  none      Comments  walk test results  -      Duration  -  Progress to 45 minutes of aerobic exercise without signs/symptoms of physical distress      Intensity  -  THRR unchanged        Progression   Progression  -  Continue to progress workloads to maintain intensity without signs/symptoms of physical distress.      Average METs  -  2.2        Resistance Training   Training Prescription  -  Yes      Weight  -  3 lbs      Reps  -  10-15        Interval Training   Interval Training  -  No        Treadmill   MPH  -  1.3      Grade  -  0.5      Minutes  -  15      METs  -  2.17        NuStep   Level  -  1      SPM  -  80      Minutes  -  15      METs  -  2.2         Exercise Comments: Exercise Comments    Row Name 06/07/17 1655 06/21/17 1714         Exercise Comments   First full day of exercise!  Patient was oriented to gym and equipment including functions, settings, policies, and procedures.  Patient's individual exercise prescription and treatment plan were reviewed.  All starting workloads were established based on the results of the 6 minute walk test done at initial orientation visit.  The plan for exercise progression was also introduced and progression will be customized based on patient's performance and goals.  The REL was too difficult for Dennis Burns and bothered his knees so he was switched to the BioStep instead for that exercise station.          Exercise Goals and  Review: Exercise Goals    Row Name 06/04/17 1519             Exercise Goals   Increase Physical Activity  Yes       Intervention  Develop an individualized exercise prescription for aerobic and resistive training based on initial evaluation findings, risk stratification, comorbidities and participant's personal goals.;Provide advice, education, support and counseling about physical activity/exercise needs.       Expected Outcomes  Short Term: Attend rehab on a regular basis to increase amount of physical activity.;Long Term: Add in home exercise to make exercise part of routine and to increase amount of physical activity.;Long Term: Exercising regularly at least 3-5 days a week.       Increase Strength and Stamina  Yes  Intervention  Provide advice, education, support and counseling about physical activity/exercise needs.;Develop an individualized exercise prescription for aerobic and resistive training based on initial evaluation findings, risk stratification, comorbidities and participant's personal goals.       Expected Outcomes  Short Term: Increase workloads from initial exercise prescription for resistance, speed, and METs.;Short Term: Perform resistance training exercises routinely during rehab and add in resistance training at home;Long Term: Improve cardiorespiratory fitness, muscular endurance and strength as measured by increased METs and functional capacity (6MWT)       Able to understand and use rate of perceived exertion (RPE) scale  Yes       Intervention  Provide education and explanation on how to use RPE scale       Expected Outcomes  Short Term: Able to use RPE daily in rehab to express subjective intensity level;Long Term:  Able to use RPE to guide intensity level when exercising independently       Able to understand and use Dyspnea scale  Yes       Intervention  Provide education and explanation on how to use Dyspnea scale       Expected Outcomes  Short Term: Able to  use Dyspnea scale daily in rehab to express subjective sense of shortness of breath during exertion;Long Term: Able to use Dyspnea scale to guide intensity level when exercising independently       Knowledge and understanding of Target Heart Rate Range (THRR)  Yes       Intervention  Provide education and explanation of THRR including how the numbers were predicted and where they are located for reference       Expected Outcomes  Short Term: Able to state/look up THRR;Short Term: Able to use daily as guideline for intensity in rehab;Long Term: Able to use THRR to govern intensity when exercising independently       Able to check pulse independently  Yes       Intervention  Provide education and demonstration on how to check pulse in carotid and radial arteries.;Review the importance of being able to check your own pulse for safety during independent exercise       Expected Outcomes  Short Term: Able to explain why pulse checking is important during independent exercise;Long Term: Able to check pulse independently and accurately       Understanding of Exercise Prescription  Yes       Intervention  Provide education, explanation, and written materials on patient's individual exercise prescription       Expected Outcomes  Short Term: Able to explain program exercise prescription;Long Term: Able to explain home exercise prescription to exercise independently          Exercise Goals Re-Evaluation : Exercise Goals Re-Evaluation    Row Name 06/07/17 1656 06/20/17 1210           Exercise Goal Re-Evaluation   Exercise Goals Review  Understanding of Exercise Prescription;Knowledge and understanding of Target Heart Rate Range (THRR);Able to understand and use rate of perceived exertion (RPE) scale;Increase Strength and Stamina;Increase Physical Activity  Increase Physical Activity;Able to understand and use rate of perceived exertion (RPE) scale;Knowledge and understanding of Target Heart Rate Range  (THRR);Understanding of Exercise Prescription;Increase Strength and Stamina      Comments  Reviewed RPE scale, THR and program prescription with pt today.  Pt voiced understanding and was given a copy of goals to take home.   Dennis Burns is reaching HR and RPE goals.  Staff will continue  to monitor progress.      Expected Outcomes  Short: Use RPE daily to regulate intensity.  Long: Follow program prescription in THR.  Short - Pt will attend consistently Long - Pt will increase overall MET level         Discharge Exercise Prescription (Final Exercise Prescription Changes): Exercise Prescription Changes - 06/20/17 1200      Response to Exercise   Blood Pressure (Admit)  142/70    Blood Pressure (Exercise)  140/62    Blood Pressure (Exit)  146/62    Heart Rate (Admit)  80 bpm    Heart Rate (Exercise)  117 bpm    Heart Rate (Exit)  87 bpm    Rating of Perceived Exertion (Exercise)  15    Symptoms  none    Duration  Progress to 45 minutes of aerobic exercise without signs/symptoms of physical distress    Intensity  THRR unchanged      Progression   Progression  Continue to progress workloads to maintain intensity without signs/symptoms of physical distress.    Average METs  2.2      Resistance Training   Training Prescription  Yes    Weight  3 lbs    Reps  10-15      Interval Training   Interval Training  No      Treadmill   MPH  1.3    Grade  0.5    Minutes  15    METs  2.17      NuStep   Level  1    SPM  80    Minutes  15    METs  2.2       Nutrition:  Target Goals: Understanding of nutrition guidelines, daily intake of sodium '1500mg'$ , cholesterol '200mg'$ , calories 30% from fat and 7% or less from saturated fats, daily to have 5 or more servings of fruits and vegetables.  Biometrics: Pre Biometrics - 06/04/17 1519      Pre Biometrics   Height  5' 3.3" (1.608 m)    Weight  134 lb 8 oz (61 kg)    Waist Circumference  33 inches    Hip Circumference  35 inches    Waist to  Hip Ratio  0.94 %    BMI (Calculated)  23.6    Single Leg Stand  5.1 seconds        Nutrition Therapy Plan and Nutrition Goals: Nutrition Therapy & Goals - 06/21/17 1803      Nutrition Therapy   Diet  DASH    Drug/Food Interactions  Statins/Certain Fruits    Protein (specify units)  6    Fiber  30 grams    Whole Grain Foods  3 servings    Saturated Fats  12 max. grams    Fruits and Vegetables  4 servings/day 8 ideal    Sodium  2000 grams      Personal Nutrition Goals   Nutrition Goal  Consume foods high in iron using the high-iron food list provided due to mild anemia    Personal Goal #2  Consume your high-calorie breakfast smoothie and 1 Ensure daily to help maintain or gain weight He has lost weight and has had trouble maintaining weight since his hospitalization    Personal Goal #3  Look for ways to add calories to meals and snacks. For example, choose high-calorie snacks like dried fruit or full-fat cottage cheese and use whole milk instead of water when cooking  Intervention Plan   Intervention  Prescribe, educate and counsel regarding individualized specific dietary modifications aiming towards targeted core components such as weight, hypertension, lipid management, diabetes, heart failure and other comorbidities.;Nutrition handout(s) given to patient. low sodium diet handout    Expected Outcomes  Short Term Goal: Understand basic principles of dietary content, such as calories, fat, sodium, cholesterol and nutrients.;Short Term Goal: A plan has been developed with personal nutrition goals set during dietitian appointment.;Long Term Goal: Adherence to prescribed nutrition plan.       Nutrition Assessments: Nutrition Assessments - 06/04/17 1333      MEDFICTS Scores   Pre Score  32       Nutrition Goals Re-Evaluation: Nutrition Goals Re-Evaluation    Row Name 06/21/17 1807 06/21/17 1809           Goals   Nutrition Goal  Look for ways to add calories to meals and  snacks. For example, choose high-calorie snacks like dried fruit or full-fat cottage cheese and use whole milk instead of water when cooking  -      Comment  He has had difficulty maintaining his weight in recent months  -      Expected Outcome  He will maintain or gain weight   -        Personal Goal #2 Re-Evaluation   Personal Goal #2  -  Consume your high-calorie breakfast smoothie and 1 Ensure daily to help maintain or gain weight        Personal Goal #3 Re-Evaluation   Personal Goal #3  -  Consume foods high in iron using the high-iron food list provided due to mild anemia         Nutrition Goals Discharge (Final Nutrition Goals Re-Evaluation): Nutrition Goals Re-Evaluation - 06/21/17 1809      Personal Goal #2 Re-Evaluation   Personal Goal #2  Consume your high-calorie breakfast smoothie and 1 Ensure daily to help maintain or gain weight      Personal Goal #3 Re-Evaluation   Personal Goal #3  Consume foods high in iron using the high-iron food list provided due to mild anemia       Psychosocial: Target Goals: Acknowledge presence or absence of significant depression and/or stress, maximize coping skills, provide positive support system. Participant is able to verbalize types and ability to use techniques and skills needed for reducing stress and depression.   Initial Review & Psychosocial Screening: Initial Psych Review & Screening - 06/04/17 1412      Initial Review   Current issues with  Current Sleep Concerns;Current Stress Concerns    Source of Stress Concerns  Unable to participate in former interests or hobbies;Unable to perform yard/household activities    Comments  Dennis Burns states that his Shortness of Breath gets in the way of him being able to perform his ADLs. He hopes this will improve and allow him to have more time to perform his ADls with less SOB.       Family Dynamics   Good Support System?  Yes Dennis Burns, 7 children, grand children , relatives, and friends at Dennis Burns      Barriers   Psychosocial barriers to participate in program  There are no identifiable barriers or psychosocial needs.;The patient should benefit from training in stress management and relaxation.      Screening Interventions   Interventions  Encouraged to exercise;To provide support and resources with identified psychosocial needs;Provide feedback about the scores to participant    Expected  Outcomes  Short Term goal: Utilizing psychosocial counselor, staff and physician to assist with identification of specific Stressors or current issues interfering with healing process. Setting desired goal for each stressor or current issue identified.;Long Term Goal: Stressors or current issues are controlled or eliminated.;Short Term goal: Identification and review with participant of any Quality of Life or Depression concerns found by scoring the questionnaire.;Long Term goal: The participant improves quality of Life and PHQ9 Scores as seen by post scores and/or verbalization of changes       Quality of Life Scores:  Quality of Life - 06/04/17 1042      Quality of Life Scores   Health/Function Pre  22 %    Socioeconomic Pre  28.93 %    Psych/Spiritual Pre  29.14 %    Family Pre  30 %    GLOBAL Pre  26.33 %      Scores of 19 and below usually indicate a poorer quality of life in these areas.  A difference of  2-3 points is a clinically meaningful difference.  A difference of 2-3 points in the total score of the Quality of Life Index has been associated with significant improvement in overall quality of life, self-image, physical symptoms, and general health in studies assessing change in quality of life.  PHQ-9: Recent Review Flowsheet Data    Depression screen Dennis Burns 2/9 06/04/2017 06/04/2017 05/03/2016   Decreased Interest - 1 0   Down, Depressed, Hopeless - 0 0   PHQ - 2 Score - 1 0   Altered sleeping (No Data)  3 -   Tired, decreased energy - 3 -   Change in appetite - 1 -   Feeling bad  or failure about yourself  - 1 -   Trouble concentrating - 1 -   Moving slowly or fidgety/restless - 0 -   Suicidal thoughts - 0 -   PHQ-9 Score - 10 -   Difficult doing work/chores - Not difficult at all -     Interpretation of Total Score  Total Score Depression Severity:  1-4 = Minimal depression, 5-9 = Mild depression, 10-14 = Moderate depression, 15-19 = Moderately severe depression, 20-27 = Severe depression   Psychosocial Evaluation and Intervention: Psychosocial Evaluation - 06/18/17 1710      Psychosocial Evaluation & Interventions   Interventions  Encouraged to exercise with the program and follow exercise prescription    Comments  Counselor met with Dennis Burns today (Dennis Burns) for initial psychosocial evaluation.  He is an 84 year old who had a heart attack on 2/25.  Dennis Burns has a strong support system with his Dennis Burns; neighbors; his children; and active involvement in his local church.  He has had multiple health issues with CABGx5 in 2005 as well as diagnosed with prostate cancer that same year.  He also has had several back surgeries and since he was in the hospital with the most recent heart attack - he has lost his voice.  Dennis Burns states he has severe shortness of breath which makes him very tired easily - to talk or do minor activities.  He reports sleeping "okay" with multiple interruptions as he is on fluid pills and is up and down in the bathroom.  He has a poor appetite as well and has been losing weight.   Dennis Burns denies a history of depression or anxiety or any current symptoms.   He has minimal stress in his life other than his health and that impacts everything.  Dennis Burns has  goals to improve his breathing and have more energy; stamina and strength.  Counselor met with Dennis Burns as well and she reports her husband is somewhat discouraged lately with his lack of energy - but she is not concerned about his mood being depressed in any way.  Counselor provided Dennis Burns with some information on a  long-acting melatonin to ask the Dr. or pharmacist about to see if it might help with his chronic interrupted sleep patterns.  Staff will follow with Dennis Burns throughout the course of this program.      Expected Outcomes  Short:  Dennis Burns or Dennis Burns will ask the Dr. or pharmacist about a controlled release melatonin vs regular to possibly help improve sleep.  Long:  Dennis Burns will benefit from consistent exercise to improve his energy and possibly breathe better.      Continue Psychosocial Services   Follow up required by staff       Psychosocial Re-Evaluation:   Psychosocial Discharge (Final Psychosocial Re-Evaluation):   Vocational Rehabilitation: Provide vocational rehab assistance to qualifying candidates.   Vocational Rehab Evaluation & Intervention: Vocational Rehab - 06/04/17 1038      Initial Vocational Rehab Evaluation & Intervention   Assessment shows need for Vocational Rehabilitation  No       Education: Education Goals: Education classes will be provided on a variety of topics geared toward better understanding of heart health and risk factor modification. Participant will state understanding/return demonstration of topics presented as noted by education test scores.  Learning Barriers/Preferences: Learning Barriers/Preferences - 06/04/17 1424      Learning Barriers/Preferences   Learning Barriers  Hearing Bilateral hearing aids    Learning Preferences  Individual Instruction;Verbal Instruction;Group Instruction;Written Material       Education Topics:  AED/CPR: - Group verbal and written instruction with the use of models to demonstrate the basic use of the AED with the basic ABC's of resuscitation.   General Nutrition Guidelines/Fats and Fiber: -Group instruction provided by verbal, written material, models and posters to present the general guidelines for heart healthy nutrition. Gives an explanation and review of dietary fats and fiber.   Controlling Sodium/Reading Food  Labels: -Group verbal and written material supporting the discussion of sodium use in heart healthy nutrition. Review and explanation with models, verbal and written materials for utilization of the food label.   Exercise Physiology & General Exercise Guidelines: - Group verbal and written instruction with models to review the exercise physiology of the cardiovascular system and associated critical values. Provides general exercise guidelines with specific guidelines to those with heart or lung disease.    Cardiac Rehab from 06/25/2017 in [facility] Cardiac and Pulmonary Rehab  Date  06/13/17  Educator  AS  Instruction Review Code  1- Verbalizes Understanding      Aerobic Exercise & Resistance Training: - Gives group verbal and written instruction on the various components of exercise. Focuses on aerobic and resistive training programs and the benefits of this training and how to safely progress through these programs..   Cardiac Rehab from 06/25/2017 in [facility] Cardiac and Pulmonary Rehab  Date  06/18/17  Educator  [facility]  Instruction Review Code  1- Geologist, engineering, Balance, Mind/Body Relaxation: Provides group verbal/written instruction on the benefits of flexibility and balance training, including mind/body exercise modes such as yoga, pilates and tai chi.  Demonstration and skill practice provided.   Cardiac Rehab from 06/25/2017 in [facility] Cardiac and Pulmonary Rehab  Date  06/25/17  Educator  Wynelle Fanny  Instruction Review Code  1- Verbalizes Understanding      Stress and Anxiety: - Provides group verbal and written instruction about the health risks of elevated stress and causes of high stress.  Discuss the correlation between heart/lung disease and anxiety and treatment options. Review healthy ways to manage with stress and anxiety.   Depression: - Provides group verbal and written instruction on the correlation between heart/lung disease and depressed mood, treatment  options, and the stigmas associated with seeking treatment.   Cardiac Rehab from 06/25/2017 in [facility] Cardiac and Pulmonary Rehab  Date  06/20/17  Educator  [facility]  Instruction Review Code  1- Verbalizes Understanding      Anatomy & Physiology of the Heart: - Group verbal and written instruction and models provide basic cardiac anatomy and physiology, with the coronary electrical and arterial systems. Review of Valvular disease and Heart Failure   Cardiac Procedures: - Group verbal and written instruction to review commonly prescribed medications for heart disease. Reviews the medication, class of the drug, and side effects. Includes the steps to properly store meds and maintain the prescription regimen. (beta blockers and nitrates)   Cardiac Medications I: - Group verbal and written instruction to review commonly prescribed medications for heart disease. Reviews the medication, class of the drug, and side effects. Includes the steps to properly store meds and maintain the prescription regimen.   Cardiac Medications II: -Group verbal and written instruction to review commonly prescribed medications for heart disease. Reviews the medication, class of the drug, and side effects. (all other drug classes)    Go Sex-Intimacy & Heart Disease, Get SMART - Goal Setting: - Group verbal and written instruction through game format to discuss heart disease and the return to sexual intimacy. Provides group verbal and written material to discuss and apply goal setting through the application of the S.M.A.R.T. Method.   Other Matters of the Heart: - Provides group verbal, written materials and models to describe Stable Angina and Peripheral Artery. Includes description of the disease process and treatment options available to the cardiac patient.   Exercise & Equipment Safety: - Individual verbal instruction and demonstration of equipment use and safety with use of the equipment.   Cardiac Rehab from  06/25/2017 in [facility] Cardiac and Pulmonary Rehab  Date  06/04/17  Educator  [facility]  Instruction Review Code  1- Verbalizes Understanding      Infection Prevention: - Provides verbal and written material to individual with discussion of infection control including proper hand washing and proper equipment cleaning during exercise session.   Cardiac Rehab from 06/25/2017 in [facility] Cardiac and Pulmonary Rehab  Date  06/04/17  Educator  [facility]  Instruction Review Code  1- Verbalizes Understanding      Falls Prevention: - Provides verbal and written material to individual with discussion of falls prevention and safety.   Cardiac Rehab from 06/25/2017 in [facility] Cardiac and Pulmonary Rehab  Date  06/04/17  Educator  [facility]  Instruction Review Code  1- Verbalizes Understanding      Diabetes: - Individual verbal and written instruction to review signs/symptoms of diabetes, desired ranges of glucose level fasting, after meals and with exercise. Acknowledge that pre and post exercise glucose checks will be done for 3 sessions at entry of program.   Know Your Numbers and Risk Factors: -Group verbal and written instruction about important numbers in your health.  Discussion of what are risk factors and how they play a role in the  disease process.  Review of Cholesterol, Blood Pressure, Diabetes, and BMI and the role they play in your overall health.   Sleep Hygiene: -Provides group verbal and written instruction about how sleep can affect your health.  Define sleep hygiene, discuss sleep cycles and impact of sleep habits. Review good sleep hygiene tips.    Other: -Provides group and verbal instruction on various topics (see comments)   Knowledge Questionnaire Score: Knowledge Questionnaire Score - 06/04/17 1425      Knowledge Questionnaire Score   Pre Score  28/28       Core Components/Risk Factors/Patient Goals at Admission: Personal Goals and Risk Factors at Admission - 06/04/17 1404      Core  Components/Risk Factors/Patient Goals on Admission   Improve shortness of breath with ADL's  Yes    Intervention  Provide education, individualized exercise plan and daily activity instruction to help decrease symptoms of SOB with activities of daily living.    Expected Outcomes  Short Term: Improve cardiorespiratory fitness to achieve a reduction of symptoms when performing ADLs;Long Term: Be able to perform more ADLs without symptoms or delay the onset of symptoms    Heart Failure  Yes    Intervention  Provide a combined exercise and nutrition program that is supplemented with education, support and counseling about heart failure. Directed toward relieving symptoms such as shortness of breath, decreased exercise tolerance, and extremity edema.    Expected Outcomes  Improve functional capacity of life;Short term: Attendance in program 2-3 days a week with increased exercise capacity. Reported lower sodium intake. Reported increased fruit and vegetable intake. Reports medication compliance.;Short term: Daily weights obtained and reported for increase. Utilizing diuretic protocols set by physician.;Long term: Adoption of self-care skills and reduction of barriers for early signs and symptoms recognition and intervention leading to self-care maintenance.    Hypertension  Yes    Intervention  Provide education on lifestyle modifcations including regular physical activity/exercise, weight management, moderate sodium restriction and increased consumption of fresh fruit, vegetables, and low fat dairy, alcohol moderation, and smoking cessation.;Monitor prescription use compliance.    Expected Outcomes  Short Term: Continued assessment and intervention until BP is < 140/50m HG in hypertensive participants. < 130/853mHG in hypertensive participants with diabetes, heart failure or chronic kidney disease.;Long Term: Maintenance of blood pressure at goal levels.    Lipids  Yes    Intervention  Provide education and  support for participant on nutrition & aerobic/resistive exercise along with prescribed medications to achieve LDL '70mg'$ , HDL >'40mg'$ .    Expected Outcomes  Short Term: Participant states understanding of desired cholesterol values and is compliant with medications prescribed. Participant is following exercise prescription and nutrition guidelines.;Long Term: Cholesterol controlled with medications as prescribed, with individualized exercise RX and with personalized nutrition plan. Value goals: LDL < '70mg'$ , HDL > 40 mg.       Core Components/Risk Factors/Patient Goals Review:    Core Components/Risk Factors/Patient Goals at Discharge (Final Review):    ITP Comments: ITP Comments    Row Name 06/04/17 1048 06/07/17 1722 06/27/17 0628       ITP Comments  Medical review completed today. ITP sent to Dr MiSabra Heckor review, changes as needed and signature. Documentation of the diagnosis can be found in [facility] 04/30/2017  First full day of exercise.   Dennis Burns did well today  30 day review. Continue with ITP unless directed changes per Medical Director     New to program  Comments:

## 2017-06-27 NOTE — Progress Notes (Signed)
Daily Session Note  Patient Details  Name: Dennis Burns MRN: [id number] Date of Birth: [birth year] Referring Provider:     Cardiac Rehab from 06/04/2017 in [facility] Cardiac and Pulmonary Rehab  Referring Provider  Ida Rogue MD      Encounter Date: 06/27/2017  Check In: Session Check In - 06/27/17 1720      Check-In   Location  [facility]-Cardiac & Pulmonary Rehab    Staff Present  Renita Papa, RN Vickki Hearing, BA, ACSM CEP, Exercise Physiologist;Carroll Enterkin, RN, BSN    Supervising physician immediately available to respond to emergencies  See telemetry face sheet for immediately available ER MD    Medication changes reported      No    Fall or balance concerns reported     No    Warm-up and Cool-down  Performed on first and last piece of equipment    Resistance Training Performed  Yes    VAD Patient?  No      Pain Assessment   Currently in Pain?  No/denies    Multiple Pain Sites  No          Social History   Tobacco Use  Smoking Status Former Smoker  . Packs/day: 0.25  . Years: 18.00  . Pack years: 4.50  . Last attempt to quit: 1978  . Years since quitting: 41.3  Smokeless Tobacco Never Used  Tobacco Comment   Quit in 1978    Goals Met:  Independence with exercise equipment Exercise tolerated well No report of cardiac concerns or symptoms Strength training completed today  Goals Unmet:  Not Applicable  Comments: Pt able to follow exercise prescription today without complaint.  Will continue to monitor for progression.    Dr. Emily Filbert is Medical Director for [place] and [facility].

## 2017-06-28 DIAGNOSIS — I214 Non-ST elevation (NSTEMI) myocardial infarction: Secondary | ICD-10-CM

## 2017-06-28 NOTE — Progress Notes (Signed)
Daily Session Note  Patient Details  Name: Nikolaos Maddocks MRN: [id number] Date of Birth: 27-May-1928 Referring Provider:     Cardiac Rehab from 06/04/2017 in [facility] Cardiac and Pulmonary Rehab  Referring Provider  Ida Rogue MD      Encounter Date: 06/28/2017  Check In: Session Check In - 06/28/17 1707      Check-In   Location  [facility]-Cardiac & Pulmonary Rehab    Staff Present  Earlean Shawl, BS, ACSM CEP, Exercise Physiologist;Meredith Sherryll Burger, RN BSN;Giuliana [facility]ide Flavia Shipper    Supervising physician immediately available to respond to emergencies  See telemetry face sheet for immediately available ER MD    Medication changes reported      No    Fall or balance concerns reported     No    Tobacco Cessation  No Change    Warm-up and Cool-down  Performed on first and last piece of equipment    Resistance Training Performed  Yes    VAD Patient?  No      Pain Assessment   Currently in Pain?  No/denies          Social History   Tobacco Use  Smoking Status Former Smoker  . Packs/day: 0.25  . Years: 18.00  . Pack years: 4.50  . Last attempt to quit: 1978  . Years since quitting: 41.3  Smokeless Tobacco Never Used  Tobacco Comment   Quit in 1978    Goals Met:  Independence with exercise equipment Exercise tolerated well No report of cardiac concerns or symptoms Strength training completed today  Goals Unmet:  Not Applicable  Comments: Pt able to follow exercise prescription today without complaint.  Will continue to monitor for progression.   Dr. Emily Filbert is Medical Director for [place] and [facility].

## 2017-06-29 DIAGNOSIS — H401133 Primary open-angle glaucoma, bilateral, severe stage: Secondary | ICD-10-CM | POA: Diagnosis not present

## 2017-07-01 ENCOUNTER — Encounter: Payer: Self-pay | Admitting: Cardiovascular Disease

## 2017-07-02 ENCOUNTER — Encounter: Payer: Medicare PPO | Admitting: *Deleted

## 2017-07-02 DIAGNOSIS — I214 Non-ST elevation (NSTEMI) myocardial infarction: Secondary | ICD-10-CM | POA: Diagnosis not present

## 2017-07-02 NOTE — Progress Notes (Signed)
Daily Session Note  Patient Details  Name: Dennis Burns MRN: [id number] Date of Birth: May 31, 1928 Referring Provider:     Cardiac Rehab from 06/04/2017 in [facility] Cardiac and Pulmonary Rehab  Referring Provider  Ida Rogue MD      Encounter Date: 07/02/2017  Check In: Session Check In - 07/02/17 1733      Check-In   Location  [facility]-Cardiac & Pulmonary Rehab    Staff Present  Earlean Shawl, BS, ACSM CEP, Exercise Physiologist;Meredith Sherryll Burger, RN BSN;Carroll Enterkin, RN, BSN;Susanne Bice, RN, BSN, [employer] physician immediately available to respond to emergencies  See telemetry face sheet for immediately available ER MD    Medication changes reported      No    Fall or balance concerns reported     No    Tobacco Cessation  No Change    Warm-up and Cool-down  Performed on first and last piece of equipment    Resistance Training Performed  Yes    VAD Patient?  No      Pain Assessment   Currently in Pain?  No/denies    Multiple Pain Sites  No          Social History   Tobacco Use  Smoking Status Former Smoker  . Packs/day: 0.25  . Years: 18.00  . Pack years: 4.50  . Last attempt to quit: 1978  . Years since quitting: 41.3  Smokeless Tobacco Never Used  Tobacco Comment   Quit in 1978    Goals Met:  Independence with exercise equipment Exercise tolerated well No report of cardiac concerns or symptoms Strength training completed today  Goals Unmet:  Not Applicable  Comments: Pt able to follow exercise prescription today without complaint.  Will continue to monitor for progression.    Dr. Emily Filbert is Medical Director for [place] and [facility].

## 2017-07-03 ENCOUNTER — Other Ambulatory Visit: Payer: Self-pay | Admitting: *Deleted

## 2017-07-03 MED ORDER — ISOSORBIDE MONONITRATE ER 60 MG PO TB24: 60.0000 mg | ORAL_TABLET | Freq: Every day | ORAL | 3 refills | Status: DC

## 2017-07-03 NOTE — Telephone Encounter (Signed)
Requested Prescriptions   Signed Prescriptions Disp Refills  . isosorbide mononitrate (IMDUR) 60 MG 24 hr tablet 90 tablet 3    Sig: Take 1 tablet (60 mg total) by mouth daily.    Authorizing Provider: Minna Merritts    Ordering User: Britt Bottom

## 2017-07-04 ENCOUNTER — Encounter: Payer: Medicare PPO | Attending: Cardiovascular Disease | Admitting: *Deleted

## 2017-07-04 DIAGNOSIS — I214 Non-ST elevation (NSTEMI) myocardial infarction: Secondary | ICD-10-CM | POA: Insufficient documentation

## 2017-07-04 NOTE — Progress Notes (Signed)
Daily Session Note  Patient Details  Name: Dennis Burns MRN: [id number] Date of Birth: April 14, 1928 Referring Provider:     Cardiac Rehab from 06/04/2017 in [facility] Cardiac and Pulmonary Rehab  Referring Provider  Ida Rogue MD      Encounter Date: 07/04/2017  Check In: Session Check In - 07/04/17 1623      Check-In   Location  [facility]-Cardiac & Pulmonary Rehab    Staff Present  Renita Papa, RN Vickki Hearing, BA, ACSM CEP, Exercise Physiologist;Carroll Enterkin, RN, BSN    Supervising physician immediately available to respond to emergencies  See telemetry face sheet for immediately available ER MD    Medication changes reported      No    Fall or balance concerns reported     No    Tobacco Cessation  No Change    Warm-up and Cool-down  Performed on first and last piece of equipment    Resistance Training Performed  Yes    VAD Patient?  No      Pain Assessment   Currently in Pain?  No/denies        Exercise Prescription Changes - 07/04/17 1500      Response to Exercise   Blood Pressure (Admit)  144/70    Blood Pressure (Exit)  128/68    Heart Rate (Admit)  121 bpm    Heart Rate (Exercise)  110 bpm    Heart Rate (Exit)  74 bpm    Rating of Perceived Exertion (Exercise)  14    Symptoms  none    Duration  Continue with 45 min of aerobic exercise without signs/symptoms of physical distress.    Intensity  THRR unchanged      Progression   Progression  Continue to progress workloads to maintain intensity without signs/symptoms of physical distress.    Average METs  2.2      Resistance Training   Training Prescription  Yes    Weight  3 lb    Reps  10-15      Interval Training   Interval Training  No      Treadmill   MPH  1.3    Grade  0.5    Minutes  15    METs  2.17      Biostep-RELP   Level  1    SPM  50    Minutes  15    METs  2       Social History   Tobacco Use  Smoking Status Former Smoker  . Packs/day: 0.25  . Years: 18.00  . Pack years:  4.50  . Last attempt to quit: 1978  . Years since quitting: 41.3  Smokeless Tobacco Never Used  Tobacco Comment   Quit in 1978    Goals Met:  Independence with exercise equipment Exercise tolerated well No report of cardiac concerns or symptoms Strength training completed today  Goals Unmet:  Not Applicable  Comments: Pt able to follow exercise prescription today without complaint.  Will continue to monitor for progression.    Dr. Emily Filbert is Medical Director for [place] and [facility].

## 2017-07-05 DIAGNOSIS — I214 Non-ST elevation (NSTEMI) myocardial infarction: Secondary | ICD-10-CM | POA: Diagnosis not present

## 2017-07-05 NOTE — Progress Notes (Signed)
Daily Session Note  Patient Details  Name: Hanish Laraia MRN: [id number] Date of Birth: March 02, 1929 Referring Provider:     Cardiac Rehab from 06/04/2017 in [facility] Cardiac and Pulmonary Rehab  Referring Provider  Ida Rogue MD      Encounter Date: 07/05/2017  Check In: Session Check In - 07/05/17 1622      Check-In   Location  [facility]-Cardiac & Pulmonary Rehab    Staff Present  Earlean Shawl, BS, ACSM CEP, Exercise Physiologist;Meredith Sherryll Burger, RN BSN;Karey Suthers Flavia Shipper    Supervising physician immediately available to respond to emergencies  See telemetry face sheet for immediately available ER MD    Medication changes reported      No    Fall or balance concerns reported     No    Tobacco Cessation  No Change    Warm-up and Cool-down  Performed on first and last piece of equipment    Resistance Training Performed  Yes    VAD Patient?  No      Pain Assessment   Currently in Pain?  No/denies          Social History   Tobacco Use  Smoking Status Former Smoker  . Packs/day: 0.25  . Years: 18.00  . Pack years: 4.50  . Last attempt to quit: 1978  . Years since quitting: 41.3  Smokeless Tobacco Never Used  Tobacco Comment   Quit in 1978    Goals Met:  Independence with exercise equipment Exercise tolerated well No report of cardiac concerns or symptoms Strength training completed today  Goals Unmet:  Not Applicable  Comments: Pt able to follow exercise prescription today without complaint.  Will continue to monitor for progression.   Dr. Emily Filbert is Medical Director for [place] and [facility].

## 2017-07-07 NOTE — Progress Notes (Signed)
Cardiology Office Note  Date:  07/10/2017   ID:  [name], DOB May 17, 1928, MRN [id number]  PCP:  [name], MD   Chief Complaint  Patient presents with  . OTHER    3 month f/u c/o constantly having to clear throat and sob. Meds reviewed verbally with pt.    HPI:  Mr. Hoback is a pleasant 84 year old gentleman with history of  Chronic shortness of breath  mild aortic valve stenosis/moderate MR/moderate pulmonary hypertension,  PAD, s/p CEA on the right,  Duplex showed significant bilateral common iliac artery stenosis as well as severe left SFA stenosis. Hyperlipidemia,  hypertension,  coronary artery disease, CABG x 4 in 2003,  Chronic renal insufficiency,  atrophic kidney,  living off one kidney, seen by nephrology,Dr. Candiss Norse chronic back pain, sciatica, spinal stenosis, back surgery 2  prostate cancer, radioactive seeds  DVT in right leg noted before 2012 surgery, on  xarelto echocardiogram October 2017 showing mild to moderate aortic valve stenosis, moderate MR, moderately elevated right heart pressures, ejection fraction 55% or greater prior smoking history less than 10 years when he was younger  who presents for follow-up of his coronary disease and PAD chronic shortness of breath,   In follow-up today reports shortness of breath continues to be a worsening chronic issue Weight trending 128 pounds periodically 130 pounds Rarely down to 125 pounds at home Renal function improving on review of labs, creatinine down 1.3, previously was 1.5 or 1.6  Has not been taking metolazone twice a week Takes metolazone sparingly and not with extra potassium Faithful with his Lasix 60 twice a day  Cramping Weight stable at 127 to 128 Previous weight 125 pounds peridically takes  Doing cardiac rehab done 12 session Having SOB with exertion, does not know if he is getting better Perhaps feels less weak  EKG personally reviewed by myself on todays visit Shows normal  sinus rhythm with rate81 bpm nonspecific ST and T wave abnormality  Previous Long hospitalization for acute on chronic respiratory distress Hospital records reviewed with the patient in detail On and off bipap, aggressive diuresis , severe pulm HTN, CRF Mod to severe MR,  mod TR, EF 35% milrinone during this admission  Moderate aortic stenosis with moderate aortic insufficiency D/c on lasix 40 BID  Cardiac cath 02/14/217 Severe underlying heavily calcified three-vessel coronary artery disease occluded LAD,  ostial right coronary artery and severe ostial left circumflex disease.   Patent LIMA to LAD and SVG to right coronary artery. The SVG to OM is possibly occluded, not visible on asending aortogram.  graft might be open given that there is some mild competitive flow in the distal left circumflex. -- left subclavian tortuosity and heavy calcifications throughout.  2.  Right heart catheterization showed only mildly elevated filling pressures, moderate pulmonary hypertension and normal cardiac output. Pulmonary wedge pressure was 16 mmHg, PA pressure was 55 over 13 mmHg and cardiac output was 5.84 with a cardiac index of 3.5.  3.  Left ventricular angiography was not performed due to chronic kidney disease.  --The native left circumflex is too high risk for PCI given involvement of left main, ostial location and heavy calcifications.  treat medically  --moderate pulmonary hypertension   Followed by Dr. Candiss Norse Previous creatinine 1.48 in October  Takes Lasix daily , sometimes twice a day After fluid hydration following cardiac catheterization creatinine down to 1.2, BUN 21 GFR up to 50  Seen in the emergency room 07/22/2016 for chest pain This occurred  when he was rushing and had significant shortness of breath Last stress test September 2017, no significant ischemia on review of images by myself Blood pressure elevated in the emergency room Creatinine up to 1.66, BUN  53  continues to have stable but moderate intensity shortness of breath, worse on exertion.  Recent episode of chest discomfort on extreme exertion   chronic back pain, now with numbness down his legs Sees pain management, taking tramadol, oxy BID prn  Other past medical history reviewed Previous noninvasive vascular evaluation which showed normal ABI on the right side and mildly reduced on the left at 0.71.  Duplex showed significant bilateral common iliac artery stenosis as well as severe left SFA stenosis.  Carotid ultrasound reviewed with him showing 60-79% left carotid disease 40-59% disease on the right  Lower extremity arterial Doppler showing at least moderate common iliac arterial disease, severe disease left mid SFA   back surgery 2012 and 2014  PMH:   has a past medical history of Allergic rhinitis due to pollen, Chronic kidney disease, stage III (moderate) ([place]), Coronary artery disease, DVT, lower extremity, recurrent ([place]), Glaucoma, History of prostate cancer (2004), Hyperlipidemia, Hypertension, Hypothyroidism, Impaired fasting glucose, and Spinal stenosis of lumbar region with radiculopathy.  PSH:    Past Surgical History:  Procedure Laterality Date  . CAROTID ENDARTERECTOMY Right 09/2004   [place] GRAFT  2003   [place]   after fall  . INSERTION PROSTATE RADIATION SEED  2004   and RT  . POSTERIOR LUMBAR FUSION  2012, 2014  . RIGHT/LEFT HEART CATH AND CORONARY ANGIOGRAPHY N/A 02/14/2017   Procedure: RIGHT/LEFT HEART CATH AND CORONARY ANGIOGRAPHY;  Surgeon: Wellington Hampshire, MD;  Location: [facility];  Service: Cardiovascular;  Laterality: N/A;    Current Outpatient Medications  Medication Sig Dispense Refill  . Alpha-Lipoic Acid 200 MG CAPS Take 1 capsule by mouth daily.    Marland Kitchen apixaban (ELIQUIS) 2.5 MG TABS tablet Take 1 tablet (2.5 mg total) by mouth 2 (two) times daily. 180 tablet 3   . atorvastatin (LIPITOR) 40 MG tablet Take 40 mg by mouth daily.    Marland Kitchen CALCIUM CITRATE PO Take 1 tablet by mouth daily. Takes 300mg  daily.    . carvedilol (COREG) 12.5 MG tablet Take 1 tablet (12.5 mg total) by mouth 2 (two) times daily with a meal. 60 tablet 0  . Cholecalciferol (VITAMIN D) 2000 units CAPS Take 4,000 Units by mouth daily.    . Coenzyme Q10 (CO Q10) 200 MG CAPS Take 1 capsule by mouth daily.    Marland Kitchen CRANBERRY EXTRACT PO Take 1 tablet by mouth daily. Takes 1500mg  daily    . ezetimibe (ZETIA) 10 MG tablet Take 1 tablet (10 mg total) by mouth daily. 90 tablet 4  . feeding supplement, ENSURE ENLIVE, (ENSURE ENLIVE) LIQD Take 237 mLs by mouth 2 (two) times daily between meals. 237 mL 12  . Ferrous Gluconate (IRON 27 PO) Take by mouth daily.    . furosemide (LASIX) 40 MG tablet Take 1.5 tablets (60 mg total) by mouth 2 (two) times daily. 135 tablet 3  . hydrALAZINE (APRESOLINE) 10 MG tablet Take 1 tablet (10 mg total) by mouth 3 (three) times daily. 270 tablet 3  . isosorbide mononitrate (IMDUR) 60 MG 24 hr tablet Take 1 tablet (60 mg total) by mouth daily. 90 tablet 3  . latanoprost (XALATAN) 0.005 % ophthalmic solution  Place 1 drop into both eyes at bedtime. In morning    . levothyroxine (SYNTHROID, LEVOTHROID) 75 MCG tablet Take 1 tablet (75 mcg total) by mouth daily. 90 tablet 3  . metolazone (ZAROXOLYN) 2.5 MG tablet Take 1 tablet (2.5 mg total) by mouth as directed. Twice a week as needed 8 tablet 0  . Multiple Vitamin (MULTIVITAMIN) tablet Take 1 tablet by mouth daily.    . nitroGLYCERIN (NITROSTAT) 0.4 MG SL tablet Place 1 tablet (0.4 mg total) under the tongue every 5 (five) minutes as needed for chest pain.  12  . Omega 3-6-9 Fatty Acids (OMEGA-3-6-9 PO) Take 1 capsule by mouth 2 (two) times daily.    . potassium chloride (K-DUR) 10 MEQ tablet Take 2 tablets (20 mEq total) by mouth daily. 180 tablet 0  . timolol (BETIMOL) 0.5 % ophthalmic solution Place 1 drop into both eyes 2  (two) times daily.    . traMADol (ULTRAM) 50 MG tablet Take 2 tablets (100 mg total) by mouth 3 (three) times daily as needed. 30 tablet 2   No current facility-administered medications for this visit.      Allergies:   Patient has no known allergies.   Social History:  The patient  reports that he quit smoking about 41 years ago. He has a 4.50 pack-year smoking history. He has never used smokeless tobacco. He reports that he drinks alcohol. He reports that he does not use drugs.   Family History:   family history includes Cancer in his father and mother; Glaucoma in his mother; Stroke in his maternal grandfather.    Review of Systems: Review of Systems  Respiratory: Positive for shortness of breath.   Cardiovascular: Negative.   Gastrointestinal: Negative.   Musculoskeletal: Negative.   Neurological: Negative.   Psychiatric/Behavioral: Negative.   All other systems reviewed and are negative.    PHYSICAL EXAM: VS:  BP 132/74 (BP Location: Left Arm, Patient Position: Sitting, Cuff Size: Normal)   Pulse 81   Ht 5\' 3"  (1.6 m)   Wt 131 lb 8 oz (59.6 kg)   BMI 23.29 kg/m  , BMI Body mass index is 23.29 kg/m. Constitutional:  oriented to person, place, and time. No distress.  HENT:  Head: Normocephalic and atraumatic.  Eyes:  no discharge. No scleral icterus.  Neck: Normal range of motion. Neck supple.  JVD unable to estimate Cardiovascular: Normal rate, regular rhythm, normal heart sounds and intact distal pulses. Exam reveals no gallop and no friction rub. No edema No murmur heard. Pulmonary/Chest: oderately decreased breath sounds throughout,  No stridor. No respiratory distress.  no wheezes.  scattered rales.  no tenderness.  Abdominal: Soft.  no distension.  no tenderness.  Musculoskeletal: Normal range of motion.  no  tenderness or deformity.  Neurological:  normal muscle tone. Coordination normal. No atrophy Skin: Skin is warm and dry. No rash noted. not diaphoretic.   Psychiatric:  normal mood and affect. behavior is normal. Thought content normal.   Recent Labs: 04/30/2017: TSH 1.819 05/07/2017: B Natriuretic Peptide 3,671.0; Magnesium 2.1 06/26/2017: BUN 59; Creatinine, Ser 1.28; Hemoglobin 12.0; Platelets 222; Potassium 3.4; Sodium 138    Lipid Panel Lab Results  Component Value Date   CHOL 114 05/01/2017   HDL 34 (L) 05/01/2017   LDLCALC 58 05/01/2017   TRIG 109 05/01/2017      Wt Readings from Last 3 Encounters:  07/10/17 131 lb 8 oz (59.6 kg)  06/04/17 134 lb 8 oz (61 kg)  05/25/17  135 lb (61.2 kg)       ASSESSMENT AND PLAN:  Coronary artery disease involving native coronary artery of native heart without angina pectoris  Medical management at this time including ostial left circumflex lesion Currently denies any anginal symptoms No medication changes made  Pulmonary hypertension Goal weight 125 pounds at home Suspect he is losing weight Previous goals 130 pounds Recommended he stay on Lasix 60 twice a day with metolazone 2.5 twice a week If weight reaches 124 pounds with check a basic metabolic panel  Pure hypercholesterolemia - Plan: EKG 38-GYKZ, Basic Metabolic Panel (BMET) Tolerating Zetia with Lipitor Goal LDL less than 70 stable  Essential hypertension - Plan: EKG 99-JTTS, Basic Metabolic Panel (BMET)  blood pressure stable No changes made  SOB (shortness of breath) - Plan: EKG 17-BLTJ, Basic Metabolic Panel (BMET) Moderate, stable Recommended he continue his rehabilitation,cardiac Stressed importance of keeping him on the dry side for symptom relief  Recurrent deep vein thrombosis (DVT) of right lower extremity (HCC) on anticoagulation , Eliquis 2.5 twice daily given his renal dysfunction  No recurrence  Spinal stenosis of lumbar region with radiculopathy Previously discussed lower extremity neuropathy, Stable  Chronic kidney disease, stage III (moderate) Followed by Dr. Richarda Overlie continue to push his  diuretics as above  PAD (peripheral artery disease) (HCC) Stable at least moderate bilateral disease Previously seen by Dr. Fletcher Anon   Total encounter time more than 45 minutes  Greater than 50% was spent in counseling and coordination of care with the patient  Disposition:   F/U  6 months   Orders Placed This Encounter  Procedures  . EKG 12-Lead     Signed, Esmond Plants, M.D., Ph.D. 07/10/2017  [address], [place]

## 2017-07-09 DIAGNOSIS — I214 Non-ST elevation (NSTEMI) myocardial infarction: Secondary | ICD-10-CM

## 2017-07-09 NOTE — Progress Notes (Signed)
Daily Session Note  Patient Details  Name: Dennis Burns MRN: [id number] Date of Birth: 03/08/1928 Referring Provider:     Cardiac Rehab from 06/04/2017 in [facility]  Referring Provider  Gollan, Timothy MD      Encounter Date: 07/09/2017  Check In: Session Check In - 07/09/17 1709      Check-In   Location  [facility]    Staff Present  Kelly Hayes, BS, ACSM CEP, Exercise Physiologist; , BA, ACSM CEP, Exercise Physiologist;Carroll Enterkin, RN, BSN    Supervising physician immediately available to respond to emergencies  See telemetry face sheet for immediately available ER MD    Medication changes reported      No    Fall or balance concerns reported     No    Warm-up and Cool-down  Performed on first and last piece of equipment    Resistance Training Performed  Yes    VAD Patient?  No      Pain Assessment   Currently in Pain?  No/denies    Multiple Pain Sites  No          Social History   Tobacco Use  Smoking Status Former Smoker  . Packs/day: 0.25  . Years: 18.00  . Pack years: 4.50  . Last attempt to quit: 1978  . Years since quitting: 41.3  Smokeless Tobacco Never Used  Tobacco Comment   Quit in 1978    Goals Met:  Independence with exercise equipment Exercise tolerated well No report of cardiac concerns or symptoms Strength training completed today  Goals Unmet:  Not Applicable  Comments: Pt able to follow exercise prescription today without complaint.  Will continue to monitor for progression.    Dr. Mark Miller is Medical Director for [facility] and [facility]. 

## 2017-07-10 ENCOUNTER — Encounter: Payer: Self-pay | Admitting: Cardiovascular Disease

## 2017-07-10 ENCOUNTER — Ambulatory Visit: Payer: Medicare PPO | Admitting: Cardiovascular Disease

## 2017-07-10 VITALS — BP 132/74 | HR 81 | Ht 63.0 in | Wt 131.5 lb

## 2017-07-10 DIAGNOSIS — I272 Pulmonary hypertension, unspecified: Secondary | ICD-10-CM

## 2017-07-10 DIAGNOSIS — E785 Hyperlipidemia, unspecified: Secondary | ICD-10-CM | POA: Diagnosis not present

## 2017-07-10 DIAGNOSIS — I5042 Chronic combined systolic (congestive) and diastolic (congestive) heart failure: Secondary | ICD-10-CM

## 2017-07-10 DIAGNOSIS — N183 Chronic kidney disease, stage 3 unspecified: Secondary | ICD-10-CM

## 2017-07-10 DIAGNOSIS — I739 Peripheral vascular disease, unspecified: Secondary | ICD-10-CM | POA: Diagnosis not present

## 2017-07-10 DIAGNOSIS — I25118 Atherosclerotic heart disease of native coronary artery with other forms of angina pectoris: Secondary | ICD-10-CM

## 2017-07-10 DIAGNOSIS — I10 Essential (primary) hypertension: Secondary | ICD-10-CM

## 2017-07-10 MED ORDER — METOLAZONE 2.5 MG PO TABS: 2.5000 mg | ORAL_TABLET | Freq: Every day | ORAL | 3 refills | Status: DC | PRN

## 2017-07-10 MED ORDER — POTASSIUM CHLORIDE ER 10 MEQ PO TBCR: 10.0000 meq | EXTENDED_RELEASE_TABLET | ORAL | 3 refills | Status: DC

## 2017-07-10 NOTE — Patient Instructions (Addendum)
Goal weight 125 pounds or less  For cramping, Take magnesium dose and potassium   Medication Instructions:   Please increase the potassium up to 10 meq three x a day  On days with metolazone and lasix, take 4 potassium pills  Labwork:  No new labs needed  Testing/Procedures:  No further testing at this time   Follow-Up: It was a pleasure seeing you in the office today. Please call us if you have new issues that need to be addressed before your next appt.  [phone]  Your physician wants you to follow-up in: 12 months.  You will receive a reminder letter in the mail two months in advance. If you don't receive a letter, please call our office to schedule the follow-up appointment.  If you need a refill on your cardiac medications before your next appointment, please call your pharmacy.  For educational health videos Log in to : [email or web address] Or : [email or web address], password : [email or web address]

## 2017-07-11 DIAGNOSIS — I214 Non-ST elevation (NSTEMI) myocardial infarction: Secondary | ICD-10-CM | POA: Diagnosis not present

## 2017-07-11 NOTE — Progress Notes (Signed)
Daily Session Note  Patient Details  Name: Dennis Burns MRN: [id number] Date of Birth: December 18, 1928 Referring Provider:     Cardiac Rehab from 06/04/2017 in [facility]  Referring Provider  Ida Rogue MD      Encounter Date: 07/11/2017  Check In: Session Check In - 07/11/17 1731      Check-In   Location  [facility]    Staff Present  Renita Papa, RN Vickki Hearing, BA, ACSM CEP, Exercise Physiologist;Carroll Enterkin, RN, BSN    Supervising physician immediately available to respond to emergencies  See telemetry face sheet for immediately available ER MD    Medication changes reported      No    Fall or balance concerns reported     No    Warm-up and Cool-down  Performed on first and last piece of equipment    Resistance Training Performed  Yes    VAD Patient?  No      Pain Assessment   Currently in Pain?  No/denies    Multiple Pain Sites  No          Social History   Tobacco Use  Smoking Status Former Smoker  . Packs/day: 0.25  . Years: 18.00  . Pack years: 4.50  . Last attempt to quit: 1978  . Years since quitting: 41.3  Smokeless Tobacco Never Used  Tobacco Comment   Quit in 1978    Goals Met:  Independence with exercise equipment Exercise tolerated well No report of cardiac concerns or symptoms Strength training completed today  Goals Unmet:  Not Applicable  Comments: Pt able to follow exercise prescription today without complaint.  Will continue to monitor for progression.    Dr. Emily Filbert is Medical Director for [facility] and [facility].

## 2017-07-12 DIAGNOSIS — I214 Non-ST elevation (NSTEMI) myocardial infarction: Secondary | ICD-10-CM

## 2017-07-12 NOTE — Progress Notes (Signed)
Daily Session Note  Patient Details  Name: Wagner Tanzi MRN: [id number] Date of Birth: 1928-09-22 Referring Provider:     Cardiac Rehab from 06/04/2017 in [facility] and [facility] Rehab  Referring Provider  Ida Rogue MD      Encounter Date: 07/12/2017  Check In: Session Check In - 07/12/17 1655      Check-In   Location  [facility]    Staff Present  Earlean Shawl, BS, ACSM CEP, Exercise Physiologist;Meredith Sherryll Burger, RN BSN;Lilya Smitherman Flavia Shipper    Supervising physician immediately available to respond to emergencies  See telemetry face sheet for immediately available ER MD    Medication changes reported      No    Fall or balance concerns reported     No    Tobacco Cessation  No Change    Warm-up and Cool-down  Performed on first and last piece of equipment    Resistance Training Performed  Yes    VAD Patient?  No      Pain Assessment   Currently in Pain?  No/denies          Social History   Tobacco Use  Smoking Status Former Smoker  . Packs/day: 0.25  . Years: 18.00  . Pack years: 4.50  . Last attempt to quit: 1978  . Years since quitting: 41.3  Smokeless Tobacco Never Used  Tobacco Comment   Quit in 1978    Goals Met:  Independence with exercise equipment Exercise tolerated well No report of cardiac concerns or symptoms Strength training completed today  Goals Unmet:  Not Applicable  Comments: Pt able to follow exercise prescription today without complaint.  Will continue to monitor for progression.   Dr. Emily Filbert is Medical Director for [facility] and [facility].

## 2017-07-16 ENCOUNTER — Telehealth: Payer: Self-pay | Admitting: *Deleted

## 2017-07-16 NOTE — Telephone Encounter (Signed)
Dennis Burns called to inform Cardiac Rehab staff that he feels he needs to rest his legs because the pain has gotten worse. He is in contact with his doctor about his knee and leg pain. If he feels better later this week, he will call back to let us know he will be here. If not, then he plans to come back next week.

## 2017-07-23 ENCOUNTER — Encounter: Payer: Medicare PPO | Admitting: *Deleted

## 2017-07-23 DIAGNOSIS — I214 Non-ST elevation (NSTEMI) myocardial infarction: Secondary | ICD-10-CM | POA: Diagnosis not present

## 2017-07-23 NOTE — Progress Notes (Signed)
Daily Session Note  Patient Details  Name: Dennis Burns MRN: [id number] Date of Birth: Jul 25, 1928 Referring Provider:     Cardiac Rehab from 06/04/2017 in [facility]  Referring Provider  Ida Rogue MD      Encounter Date: 07/23/2017  Check In: Session Check In - 07/23/17 1633      Check-In   Location  [facility]    Staff Present  Gerlene Burdock, RN, Moises Blood, BS, ACSM CEP, Exercise Physiologist;Amanda Oletta Darter, [place], ACSM CEP, Exercise Physiologist    Supervising physician immediately available to respond to emergencies  See telemetry face sheet for immediately available ER MD    Medication changes reported      No    Fall or balance concerns reported     No    Tobacco Cessation  No Change    Warm-up and Cool-down  Performed on first and last piece of equipment    Resistance Training Performed  Yes    VAD Patient?  No      Pain Assessment   Currently in Pain?  No/denies    Multiple Pain Sites  No          Social History   Tobacco Use  Smoking Status Former Smoker  . Packs/day: 0.25  . Years: 18.00  . Pack years: 4.50  . Last attempt to quit: 1978  . Years since quitting: 41.4  Smokeless Tobacco Never Used  Tobacco Comment   Quit in 1978    Goals Met:  Independence with exercise equipment Exercise tolerated well No report of cardiac concerns or symptoms Strength training completed today  Goals Unmet:  Not Applicable  Comments: Pt able to follow exercise prescription today without complaint.  Will continue to monitor for progression.    Dr. Emily Filbert is Medical Director for [facility] and [facility].

## 2017-07-25 ENCOUNTER — Ambulatory Visit: Payer: Medicare PPO | Admitting: Internal Medicine

## 2017-07-25 ENCOUNTER — Encounter: Payer: Self-pay | Admitting: Internal Medicine

## 2017-07-25 ENCOUNTER — Encounter: Payer: Self-pay | Admitting: *Deleted

## 2017-07-25 VITALS — BP 128/72 | HR 75 | Temp 97.7°F | Ht 63.0 in | Wt 131.8 lb

## 2017-07-25 VITALS — Ht 63.3 in | Wt 130.3 lb

## 2017-07-25 DIAGNOSIS — N183 Chronic kidney disease, stage 3 unspecified: Secondary | ICD-10-CM

## 2017-07-25 DIAGNOSIS — R490 Dysphonia: Secondary | ICD-10-CM

## 2017-07-25 DIAGNOSIS — I5042 Chronic combined systolic (congestive) and diastolic (congestive) heart failure: Secondary | ICD-10-CM | POA: Diagnosis not present

## 2017-07-25 DIAGNOSIS — I214 Non-ST elevation (NSTEMI) myocardial infarction: Secondary | ICD-10-CM | POA: Diagnosis not present

## 2017-07-25 DIAGNOSIS — F39 Unspecified mood [affective] disorder: Secondary | ICD-10-CM | POA: Insufficient documentation

## 2017-07-25 NOTE — Assessment & Plan Note (Signed)
Not sure if he has vocal cord paralysis Doubt PND or sig heartburn Will set up with ENT

## 2017-07-25 NOTE — Progress Notes (Signed)
Cardiac Individual Treatment Plan  Patient Details  Name: Dennis Burns MRN: [id number] Date of Birth: [birth year] Referring Provider:     Cardiac Rehab from 06/04/2017 in [facility] Cardiac and Pulmonary Rehab  Referring Provider  Ida Rogue MD      Initial Encounter Date:    Cardiac Rehab from 06/04/2017 in [facility] Cardiac and Pulmonary Rehab  Date  06/04/17  Referring Provider  Ida Rogue MD      Visit Diagnosis: NSTEMI (non-ST elevated myocardial infarction) [facility])  Patient's Home Medications on Admission:  Current Outpatient Medications:  .  Alpha-Lipoic Acid 200 MG CAPS, Take 1 capsule by mouth daily., Disp: , Rfl:  .  apixaban (ELIQUIS) 2.5 MG TABS tablet, Take 1 tablet (2.5 mg total) by mouth 2 (two) times daily., Disp: 180 tablet, Rfl: 3 .  atorvastatin (LIPITOR) 40 MG tablet, Take 40 mg by mouth daily., Disp: , Rfl:  .  CALCIUM CITRATE PO, Take 1 tablet by mouth daily. Takes 336m daily., Disp: , Rfl:  .  carvedilol (COREG) 12.5 MG tablet, Take 1 tablet (12.5 mg total) by mouth 2 (two) times daily with a meal., Disp: 60 tablet, Rfl: 0 .  Cholecalciferol (VITAMIN D) 2000 units CAPS, Take 4,000 Units by mouth daily., Disp: , Rfl:  .  Coenzyme Q10 (CO Q10) 200 MG CAPS, Take 1 capsule by mouth daily., Disp: , Rfl:  .  CRANBERRY EXTRACT PO, Take 1 tablet by mouth daily. Takes 15064mdaily, Disp: , Rfl:  .  ezetimibe (ZETIA) 10 MG tablet, Take 1 tablet (10 mg total) by mouth daily., Disp: 90 tablet, Rfl: 4 .  feeding supplement, ENSURE ENLIVE, (ENSURE ENLIVE) LIQD, Take 237 mLs by mouth 2 (two) times daily between meals., Disp: 237 mL, Rfl: 12 .  Ferrous Gluconate (IRON 27 PO), Take by mouth daily., Disp: , Rfl:  .  furosemide (LASIX) 40 MG tablet, Take 1.5 tablets (60 mg total) by mouth 2 (two) times daily., Disp: 135 tablet, Rfl: 3 .  hydrALAZINE (APRESOLINE) 10 MG tablet, Take 1 tablet (10 mg total) by mouth 3 (three) times daily., Disp: 270 tablet, Rfl: 3 .  isosorbide  mononitrate (IMDUR) 60 MG 24 hr tablet, Take 1 tablet (60 mg total) by mouth daily., Disp: 90 tablet, Rfl: 3 .  latanoprost (XALATAN) 0.005 % ophthalmic solution, Place 1 drop into both eyes at bedtime. In morning, Disp: , Rfl:  .  levothyroxine (SYNTHROID, LEVOTHROID) 75 MCG tablet, Take 1 tablet (75 mcg total) by mouth daily., Disp: 90 tablet, Rfl: 3 .  metolazone (ZAROXOLYN) 2.5 MG tablet, Take 1 tablet (2.5 mg total) by mouth daily as needed., Disp: 90 tablet, Rfl: 3 .  Multiple Vitamin (MULTIVITAMIN) tablet, Take 1 tablet by mouth daily., Disp: , Rfl:  .  nitroGLYCERIN (NITROSTAT) 0.4 MG SL tablet, Place 1 tablet (0.4 mg total) under the tongue every 5 (five) minutes as needed for chest pain., Disp: , Rfl: 12 .  Omega 3-6-9 Fatty Acids (OMEGA-3-6-9 PO), Take 1 capsule by mouth 2 (two) times daily., Disp: , Rfl:  .  potassium chloride (K-DUR) 10 MEQ tablet, Take 1 tablet (10 mEq total) by mouth as directed. Take 3 times daily and 4 pills when taking metolazone, Disp: 360 tablet, Rfl: 3 .  timolol (BETIMOL) 0.5 % ophthalmic solution, Place 1 drop into both eyes 2 (two) times daily., Disp: , Rfl:  .  traMADol (ULTRAM) 50 MG tablet, Take 2 tablets (100 mg total) by mouth 3 (three) times daily as needed., Disp:  30 tablet, Rfl: 2  Past Medical History: Past Medical History:  Diagnosis Date  . Allergic rhinitis due to pollen    as child--better now  . Chronic kidney disease, stage III (moderate) (HCC)   . Coronary artery disease   . DVT, lower extremity, recurrent ([place])   . Glaucoma    [facility]   . History of prostate cancer 2004  . Hyperlipidemia   . Hypertension   . Hypothyroidism   . Impaired fasting glucose   . Spinal stenosis of lumbar region with radiculopathy     Tobacco Use: Social History   Tobacco Use  Smoking Status Former Smoker  . Packs/day: 0.25  . Years: 18.00  . Pack years: 4.50  . Last attempt to quit: 1978  . Years since quitting: 41.4  Smokeless Tobacco  Never Used  Tobacco Comment   Quit in 1978    Labs: Recent Review Flowsheet Data    Labs for ITP Cardiac and Pulmonary Rehab Latest Ref Rng & Units 02/07/2016 02/14/2017 02/14/2017 05/01/2017   Cholestrol 0 - 200 mg/dL 136 - - 114   LDLCALC 0 - 99 mg/dL 64 - - 58   HDL >40 mg/dL 54 - - 34(L)   Trlycerides <150 mg/dL 92 - - 109   PHART 7.350 - 7.450 - - 7.386 7.44   PCO2ART 32.0 - 48.0 mmHg - - 33.9 29(L)   HCO3 20.0 - 28.0 mmol/L - 18.6(L) 20.3 19.7(L)   TCO2 22 - 32 mmol/L - 20(L) 21(L) -   ACIDBASEDEF 0.0 - 2.0 mmol/L - 5.0(H) 4.0(H) 3.2(H)   O2SAT % - 70.0 92.0 92.3       Exercise Target Goals:    Exercise Program Goal: Individual exercise prescription set using results from initial 6 min walk test and THRR while considering  patient's activity barriers and safety.   Exercise Prescription Goal: Initial exercise prescription builds to 30-45 minutes a day of aerobic activity, 2-3 days per week.  Home exercise guidelines will be given to patient during program as part of exercise prescription that the participant will acknowledge.  Activity Barriers & Risk Stratification: Activity Barriers & Cardiac Risk Stratification - 06/04/17 1400      Activity Barriers & Cardiac Risk Stratification   Activity Barriers  -- Chronic back and neck pain: controls with exercise and tramadol as needed.  2 spinal fusions Was very active       6 Minute Walk: 6 Minute Walk    Row Name 06/04/17 1515         6 Minute Walk   Phase  Initial     Distance  1030 feet     Walk Time  6 minutes     # of Rest Breaks  0     MPH  1.95     METS  1.98     RPE  15     Perceived Dyspnea   2     VO2 Peak  6.91     Symptoms  Yes (comment)     Comments  SOB     Resting HR  86 bpm     Resting BP  130/64     Resting Oxygen Saturation   98 %     Exercise Oxygen Saturation  during 6 min walk  96 %     Max Ex. HR  118 bpm     Max Ex. BP  144/66     2 Minute Post BP  124/62  Interval HR   1  Minute HR  112     2 Minute HR  117     3 Minute HR  119     4 Minute HR  117     5 Minute HR  119     6 Minute HR  119     2 Minute Post HR  97     Interval Heart Rate?  Yes       Interval Oxygen   Interval Oxygen?  Yes     Baseline Oxygen Saturation %  98 %     1 Minute Oxygen Saturation %  96 %     1 Minute Liters of Oxygen  0 L Room Air     2 Minute Oxygen Saturation %  97 %     2 Minute Liters of Oxygen  0 L     3 Minute Oxygen Saturation %  98 %     3 Minute Liters of Oxygen  0 L     4 Minute Oxygen Saturation %  98 %     4 Minute Liters of Oxygen  0 L     5 Minute Oxygen Saturation %  98 %     5 Minute Liters of Oxygen  0 L     6 Minute Oxygen Saturation %  96 %     6 Minute Liters of Oxygen  0 L     2 Minute Post Oxygen Saturation %  96 %     2 Minute Post Liters of Oxygen  0 L        Oxygen Initial Assessment:   Oxygen Re-Evaluation:   Oxygen Discharge (Final Oxygen Re-Evaluation):   Initial Exercise Prescription: Initial Exercise Prescription - 06/04/17 1500      Date of Initial Exercise RX and Referring Provider   Date  06/04/17    Referring Provider  Ida Rogue MD      Treadmill   MPH  1.3    Grade  0.5    Minutes  15    METs  2.09      NuStep   Level  2    SPM  80    Minutes  15    METs  2      Recumbant Elliptical   Level  1    RPM  50    Minutes  15    METs  2      Prescription Details   Frequency (times per week)  3    Duration  Progress to 45 minutes of aerobic exercise without signs/symptoms of physical distress      Intensity   THRR 40-80% of Max Heartrate  105-124    Ratings of Perceived Exertion  11-15    Perceived Dyspnea  0-4      Progression   Progression  Continue to progress workloads to maintain intensity without signs/symptoms of physical distress.      Resistance Training   Training Prescription  Yes    Weight  3 lbs    Reps  10-15       Perform Capillary Blood Glucose checks as needed.  Exercise  Prescription Changes: Exercise Prescription Changes    Row Name 06/04/17 1300 06/20/17 1200 07/04/17 1500 07/05/17 1600 07/17/17 1500     Response to Exercise   Blood Pressure (Admit)  130/64  142/70  144/70  -  112/58   Blood Pressure (Exercise)  144/66  140/62  -  -  140/80   Blood Pressure (Exit)  124/62  146/62  128/68  -  120/60   Heart Rate (Admit)  86 bpm  80 bpm  121 bpm  -  103 bpm   Heart Rate (Exercise)  118 bpm  117 bpm  110 bpm  -  113 bpm   Heart Rate (Exit)  97 bpm  87 bpm  74 bpm  -  98 bpm   Oxygen Saturation (Admit)  98 %  -  -  -  -   Oxygen Saturation (Exercise)  96 %  -  -  -  -   Oxygen Saturation (Exit)  96 %  -  -  -  -   Rating of Perceived Exertion (Exercise)  '15  15  14  '$ -  13   Perceived Dyspnea (Exercise)  2  -  -  -  -   Symptoms  SOB  none  none  -  none   Comments  walk test results  -  -  -  -   Duration  -  Progress to 45 minutes of aerobic exercise without signs/symptoms of physical distress  Continue with 45 min of aerobic exercise without signs/symptoms of physical distress.  Continue with 45 min of aerobic exercise without signs/symptoms of physical distress.  Continue with 45 min of aerobic exercise without signs/symptoms of physical distress.   Intensity  -  THRR unchanged  THRR unchanged  THRR unchanged  THRR unchanged     Progression   Progression  -  Continue to progress workloads to maintain intensity without signs/symptoms of physical distress.  Continue to progress workloads to maintain intensity without signs/symptoms of physical distress.  Continue to progress workloads to maintain intensity without signs/symptoms of physical distress.  Continue to progress workloads to maintain intensity without signs/symptoms of physical distress.   Average METs  -  2.2  2.2  2.2  2.6     Resistance Training   Training Prescription  -  Yes  Yes  Yes  Yes   Weight  -  3 lbs  3 lb  3 lb  3 lb   Reps  -  10-15  10-15  10-15  10-15     Interval Training    Interval Training  -  No  No  No  -     Treadmill   MPH  -  1.3  1.3  1.3  1.3   Grade  -  0.5  0.5  0.5  0.5   Minutes  -  '15  15  15  15   '$ METs  -  2.17  2.17  2.17  2.17     NuStep   Level  -  1  -  -  2   SPM  -  80  -  -  80   Minutes  -  15  -  -  15   METs  -  2.2  -  -  2.6     Biostep-RELP   Level  -  -  '1  1  1   '$ SPM  -  -  50  50  50   Minutes  -  -  '15  15  15   '$ METs  -  -  '2  2  3     '$ Home Exercise Plan   Plans to continue exercise at  -  -  -  Home (comment) walking at home on  off days of class  Home (comment) walking at home on off days of class   Frequency  -  -  -  Add 2 additional days to program exercise sessions.  Add 2 additional days to program exercise sessions.   Initial Home Exercises Provided  -  -  -  07/05/17  07/05/17      Exercise Comments: Exercise Comments    Row Name 06/07/17 1655 06/21/17 1714         Exercise Comments   First full day of exercise!  Patient was oriented to gym and equipment including functions, settings, policies, and procedures.  Patient's individual exercise prescription and treatment plan were reviewed.  All starting workloads were established based on the results of the 6 minute walk test done at initial orientation visit.  The plan for exercise progression was also introduced and progression will be customized based on patient's performance and goals.  The REL was too difficult for Dennis Burns and bothered his knees so he was switched to the BioStep instead for that exercise station.          Exercise Goals and Review: Exercise Goals    Row Name 06/04/17 1519             Exercise Goals   Increase Physical Activity  Yes       Intervention  Develop an individualized exercise prescription for aerobic and resistive training based on initial evaluation findings, risk stratification, comorbidities and participant's personal goals.;Provide advice, education, support and counseling about physical activity/exercise needs.        Expected Outcomes  Short Term: Attend rehab on a regular basis to increase amount of physical activity.;Long Term: Add in home exercise to make exercise part of routine and to increase amount of physical activity.;Long Term: Exercising regularly at least 3-5 days a week.       Increase Strength and Stamina  Yes       Intervention  Provide advice, education, support and counseling about physical activity/exercise needs.;Develop an individualized exercise prescription for aerobic and resistive training based on initial evaluation findings, risk stratification, comorbidities and participant's personal goals.       Expected Outcomes  Short Term: Increase workloads from initial exercise prescription for resistance, speed, and METs.;Short Term: Perform resistance training exercises routinely during rehab and add in resistance training at home;Long Term: Improve cardiorespiratory fitness, muscular endurance and strength as measured by increased METs and functional capacity (6MWT)       Able to understand and use rate of perceived exertion (RPE) scale  Yes       Intervention  Provide education and explanation on how to use RPE scale       Expected Outcomes  Short Term: Able to use RPE daily in rehab to express subjective intensity level;Long Term:  Able to use RPE to guide intensity level when exercising independently       Able to understand and use Dyspnea scale  Yes       Intervention  Provide education and explanation on how to use Dyspnea scale       Expected Outcomes  Short Term: Able to use Dyspnea scale daily in rehab to express subjective sense of shortness of breath during exertion;Long Term: Able to use Dyspnea scale to guide intensity level when exercising independently       Knowledge and understanding of Target Heart Rate Range (THRR)  Yes       Intervention  Provide education and explanation of THRR including  how the numbers were predicted and where they are located for reference       Expected  Outcomes  Short Term: Able to state/look up THRR;Short Term: Able to use daily as guideline for intensity in rehab;Long Term: Able to use THRR to govern intensity when exercising independently       Able to check pulse independently  Yes       Intervention  Provide education and demonstration on how to check pulse in carotid and radial arteries.;Review the importance of being able to check your own pulse for safety during independent exercise       Expected Outcomes  Short Term: Able to explain why pulse checking is important during independent exercise;Long Term: Able to check pulse independently and accurately       Understanding of Exercise Prescription  Yes       Intervention  Provide education, explanation, and written materials on patient's individual exercise prescription       Expected Outcomes  Short Term: Able to explain program exercise prescription;Long Term: Able to explain home exercise prescription to exercise independently          Exercise Goals Re-Evaluation : Exercise Goals Re-Evaluation    Row Name 06/07/17 1656 06/20/17 1210 07/04/17 1528 07/05/17 1700 07/17/17 1550     Exercise Goal Re-Evaluation   Exercise Goals Review  Understanding of Exercise Prescription;Knowledge and understanding of Target Heart Rate Range (THRR);Able to understand and use rate of perceived exertion (RPE) scale;Increase Strength and Stamina;Increase Physical Activity  Increase Physical Activity;Able to understand and use rate of perceived exertion (RPE) scale;Knowledge and understanding of Target Heart Rate Range (THRR);Understanding of Exercise Prescription;Increase Strength and Stamina  Increase Physical Activity;Increase Strength and Stamina;Able to understand and use rate of perceived exertion (RPE) scale  Increase Physical Activity;Increase Strength and Stamina;Able to understand and use rate of perceived exertion (RPE) scale;Knowledge and understanding of Target Heart Rate Range (THRR);Able to  understand and use Dyspnea scale;Understanding of Exercise Prescription;Able to check pulse independently  Increase Physical Activity;Able to understand and use rate of perceived exertion (RPE) scale;Increase Strength and Stamina;Able to understand and use Dyspnea scale   Comments  Reviewed RPE scale, THR and program prescription with pt today.  Pt voiced understanding and was given a copy of goals to take home.   Dennis Burns is reaching HR and RPE goals.  Staff will continue to monitor progress.  Dennis Burns is tolerating exercise well. Staff will monitor progress.  Home exercise guidelines reviewed with patient. He demostrated understanding of these guidelines.   Dennis Burns will miss class this week - feels like he has shin splints.  he has made good progress otherwise and imporved MET level.   Expected Outcomes  Short: Use RPE daily to regulate intensity.  Long: Follow program prescription in THR.  Short - Pt will attend consistently Long - Pt will increase overall MET level  Short - review home exercise with Dennis Burns Long - improve overall MET level  Short: Add 1-2 days of walking at home in addition to attending Cardiac Rehab on a regular basis. Long: Increase fitness level and improve SOB with ADL's.   Short - Dennis Burns will attend regularly Long - Dennis Burns will continue to improve MET level      Discharge Exercise Prescription (Final Exercise Prescription Changes): Exercise Prescription Changes - 07/17/17 1500      Response to Exercise   Blood Pressure (Admit)  112/58    Blood Pressure (Exercise)  140/80    Blood Pressure (Exit)  120/60    Heart Rate (Admit)  103 bpm    Heart Rate (Exercise)  113 bpm    Heart Rate (Exit)  98 bpm    Rating of Perceived Exertion (Exercise)  13    Symptoms  none    Duration  Continue with 45 min of aerobic exercise without signs/symptoms of physical distress.    Intensity  THRR unchanged      Progression   Progression  Continue to progress workloads to maintain intensity without  signs/symptoms of physical distress.    Average METs  2.6      Resistance Training   Training Prescription  Yes    Weight  3 lb    Reps  10-15      Treadmill   MPH  1.3    Grade  0.5    Minutes  15    METs  2.17      NuStep   Level  2    SPM  80    Minutes  15    METs  2.6      Biostep-RELP   Level  1    SPM  50    Minutes  15    METs  3      Home Exercise Plan   Plans to continue exercise at  Home (comment) walking at home on off days of class    Frequency  Add 2 additional days to program exercise sessions.    Initial Home Exercises Provided  07/05/17       Nutrition:  Target Goals: Understanding of nutrition guidelines, daily intake of sodium '1500mg'$ , cholesterol '200mg'$ , calories 30% from fat and 7% or less from saturated fats, daily to have 5 or more servings of fruits and vegetables.  Biometrics: Pre Biometrics - 06/04/17 1519      Pre Biometrics   Height  5' 3.3" (1.608 m)    Weight  134 lb 8 oz (61 kg)    Waist Circumference  33 inches    Hip Circumference  35 inches    Waist to Hip Ratio  0.94 %    BMI (Calculated)  23.6    Single Leg Stand  5.1 seconds        Nutrition Therapy Plan and Nutrition Goals: Nutrition Therapy & Goals - 06/21/17 1803      Nutrition Therapy   Diet  DASH    Drug/Food Interactions  Statins/Certain Fruits    Protein (specify units)  6    Fiber  30 grams    Whole Grain Foods  3 servings    Saturated Fats  12 max. grams    Fruits and Vegetables  4 servings/day 8 ideal    Sodium  2000 grams      Personal Nutrition Goals   Nutrition Goal  Consume foods high in iron using the high-iron food list provided due to mild anemia    Personal Goal #2  Consume your high-calorie breakfast smoothie and 1 Ensure daily to help maintain or gain weight He has lost weight and has had trouble maintaining weight since his hospitalization    Personal Goal #3  Look for ways to add calories to meals and snacks. For example, choose high-calorie  snacks like dried fruit or full-fat cottage cheese and use whole milk instead of water when cooking      Intervention Plan   Intervention  Prescribe, educate and counsel regarding individualized specific dietary modifications aiming towards targeted core components such as weight, hypertension, lipid  management, diabetes, heart failure and other comorbidities.;Nutrition handout(s) given to patient. low sodium diet handout    Expected Outcomes  Short Term Goal: Understand basic principles of dietary content, such as calories, fat, sodium, cholesterol and nutrients.;Short Term Goal: A plan has been developed with personal nutrition goals set during dietitian appointment.;Long Term Goal: Adherence to prescribed nutrition plan.       Nutrition Assessments: Nutrition Assessments - 06/04/17 1333      MEDFICTS Scores   Pre Score  32       Nutrition Goals Re-Evaluation: Nutrition Goals Re-Evaluation    Row Name 06/21/17 1807 06/21/17 1809 07/23/17 1728         Goals   Nutrition Goal  Look for ways to add calories to meals and snacks. For example, choose high-calorie snacks like dried fruit or full-fat cottage cheese and use whole milk instead of water when cooking  -  Dennis Burns is having smoothies and Ensure to add calories.     Comment  He has had difficulty maintaining his weight in recent months  -  Dennis Burns is having smoothies and Ensure to add calories.  he is also having ice cream.     Expected Outcome  He will maintain or gain weight   -  Short - Dennis Burns will continue to eat healthy snacks to add calories Long - Dennis Burns will maintain current weight at direction of cardiologist       Personal Goal #2 Re-Evaluation   Personal Goal #2  -  Consume your high-calorie breakfast smoothie and 1 Ensure daily to help maintain or gain weight  -       Personal Goal #3 Re-Evaluation   Personal Goal #3  -  Consume foods high in iron using the high-iron food list provided due to mild anemia  -        Nutrition  Goals Discharge (Final Nutrition Goals Re-Evaluation): Nutrition Goals Re-Evaluation - 07/23/17 1728      Goals   Nutrition Goal  Dennis Burns is having smoothies and Ensure to add calories.    Comment  Dennis Burns is having smoothies and Ensure to add calories.  he is also having ice cream.    Expected Outcome  Short - Dennis Burns will continue to eat healthy snacks to add calories Long - Dennis Burns will maintain current weight at direction of cardiologist       Psychosocial: Target Goals: Acknowledge presence or absence of significant depression and/or stress, maximize coping skills, provide positive support system. Participant is able to verbalize types and ability to use techniques and skills needed for reducing stress and depression.   Initial Review & Psychosocial Screening: Initial Psych Review & Screening - 06/04/17 1412      Initial Review   Current issues with  Current Sleep Concerns;Current Stress Concerns    Source of Stress Concerns  Unable to participate in former interests or hobbies;Unable to perform yard/household activities    Comments  Dennis Burns states that his Shortness of Breath gets in the way of him being able to perform his ADLs. He hopes this will improve and allow him to have more time to perform his ADls with less SOB.       Family Dynamics   Good Support System?  Yes Spouse, 7 children, grand children , relatives, and friends at [facility]      Barriers   Psychosocial barriers to participate in program  There are no identifiable barriers or psychosocial needs.;The patient should benefit from training in stress management  and relaxation.      Screening Interventions   Interventions  Encouraged to exercise;To provide support and resources with identified psychosocial needs;Provide feedback about the scores to participant    Expected Outcomes  Short Term goal: Utilizing psychosocial counselor, staff and physician to assist with identification of specific Stressors or current issues interfering  with healing process. Setting desired goal for each stressor or current issue identified.;Long Term Goal: Stressors or current issues are controlled or eliminated.;Short Term goal: Identification and review with participant of any Quality of Life or Depression concerns found by scoring the questionnaire.;Long Term goal: The participant improves quality of Life and PHQ9 Scores as seen by post scores and/or verbalization of changes       Quality of Life Scores:  Quality of Life - 06/04/17 1042      Quality of Life Scores   Health/Function Pre  22 %    Socioeconomic Pre  28.93 %    Psych/Spiritual Pre  29.14 %    Family Pre  30 %    GLOBAL Pre  26.33 %      Scores of 19 and below usually indicate a poorer quality of life in these areas.  A difference of  2-3 points is a clinically meaningful difference.  A difference of 2-3 points in the total score of the Quality of Life Index has been associated with significant improvement in overall quality of life, self-image, physical symptoms, and general health in studies assessing change in quality of life.  PHQ-9: Recent Review Flowsheet Data    Depression screen [facility] 2/9 06/04/2017 06/04/2017 05/03/2016   Decreased Interest - 1 0   Down, Depressed, Hopeless - 0 0   PHQ - 2 Score - 1 0   Altered sleeping (No Data)  3 -   Tired, decreased energy - 3 -   Change in appetite - 1 -   Feeling bad or failure about yourself  - 1 -   Trouble concentrating - 1 -   Moving slowly or fidgety/restless - 0 -   Suicidal thoughts - 0 -   PHQ-9 Score - 10 -   Difficult doing work/chores - Not difficult at all -     Interpretation of Total Score  Total Score Depression Severity:  1-4 = Minimal depression, 5-9 = Mild depression, 10-14 = Moderate depression, 15-19 = Moderately severe depression, 20-27 = Severe depression   Psychosocial Evaluation and Intervention: Psychosocial Evaluation - 06/18/17 1710      Psychosocial Evaluation & Interventions    Interventions  Encouraged to exercise with the program and follow exercise prescription    Comments  Counselor met with Dennis Burns today (Dennis Burns) for initial psychosocial evaluation.  He is an 84 year old who had a heart attack on 2/25.  Dennis Burns has a strong support system with his spouse; neighbors; his children; and active involvement in his local church.  He has had multiple health issues with CABGx5 in 2005 as well as diagnosed with prostate cancer that same year.  He also has had several back surgeries and since he was in the hospital with the most recent heart attack - he has lost his voice.  Dennis Burns states he has severe shortness of breath which makes him very tired easily - to talk or do minor activities.  He reports sleeping "okay" with multiple interruptions as he is on fluid pills and is up and down in the bathroom.  He has a poor appetite as well and has been losing  weight.   Dennis Burns denies a history of depression or anxiety or any current symptoms.   He has minimal stress in his life other than his health and that impacts everything.  Dennis Burns has goals to improve his breathing and have more energy; stamina and strength.  Counselor met with Dennis Burns as well and she reports her husband is somewhat discouraged lately with his lack of energy - but she is not concerned about his mood being depressed in any way.  Counselor provided Ms. P with some information on a long-acting melatonin to ask the Dr. or pharmacist about to see if it might help with his chronic interrupted sleep patterns.  Staff will follow with Dennis Burns throughout the course of this program.      Expected Outcomes  Short:  Dennis Burns or Ms. P will ask the Dr. or pharmacist about a controlled release melatonin vs regular to possibly help improve sleep.  Long:  Dennis Burns will benefit from consistent exercise to improve his energy and possibly breathe better.      Continue Psychosocial Services   Follow up required by staff       Psychosocial  Re-Evaluation: Psychosocial Re-Evaluation    [place] Name 07/23/17 1736             Psychosocial Re-Evaluation   Current issues with  Current Sleep Concerns       Comments  Dennis Burns doesnt sleep well due to fluid pills.  He does go back to sleep - just has to get up to use restroom.  This makes him tired in the morning.  He has talked his Dr and they suggested melatonin which helps       Expected Outcomes  Short - continue Dr recommendations for meds Long - pt will develop better sleep patterns and feel more energetic       Continue Psychosocial Services   Follow up required by staff          Psychosocial Discharge (Final Psychosocial Re-Evaluation): Psychosocial Re-Evaluation - 07/23/17 1736      Psychosocial Re-Evaluation   Current issues with  Current Sleep Concerns    Comments  Dennis Burns doesnt sleep well due to fluid pills.  He does go back to sleep - just has to get up to use restroom.  This makes him tired in the morning.  He has talked his Dr and they suggested melatonin which helps    Expected Outcomes  Short - continue Dr recommendations for meds Long - pt will develop better sleep patterns and feel more energetic    Continue Psychosocial Services   Follow up required by staff       Vocational Rehabilitation: Provide vocational rehab assistance to qualifying candidates.   Vocational Rehab Evaluation & Intervention: Vocational Rehab - 06/04/17 1038      Initial Vocational Rehab Evaluation & Intervention   Assessment shows need for Vocational Rehabilitation  No       Education: Education Goals: Education classes will be provided on a variety of topics geared toward better understanding of heart health and risk factor modification. Participant will state understanding/return demonstration of topics presented as noted by education test scores.  Learning Barriers/Preferences: Learning Barriers/Preferences - 06/04/17 1424      Learning Barriers/Preferences   Learning Barriers   Hearing Bilateral hearing aids    Learning Preferences  Individual Instruction;Verbal Instruction;Group Instruction;Written Material       Education Topics:  AED/CPR: - Group verbal and written instruction with the use of models to demonstrate  the basic use of the AED with the basic ABC's of resuscitation.   General Nutrition Guidelines/Fats and Fiber: -Group instruction provided by verbal, written material, models and posters to present the general guidelines for heart healthy nutrition. Gives an explanation and review of dietary fats and fiber.   Cardiac Rehab from 07/23/2017 in [facility] Cardiac and Pulmonary Rehab  Date  07/23/17  Educator  CR  Instruction Review Code  1- Verbalizes Understanding      Controlling Sodium/Reading Food Labels: -Group verbal and written material supporting the discussion of sodium use in heart healthy nutrition. Review and explanation with models, verbal and written materials for utilization of the food label.   Exercise Physiology & General Exercise Guidelines: - Group verbal and written instruction with models to review the exercise physiology of the cardiovascular system and associated critical values. Provides general exercise guidelines with specific guidelines to those with heart or lung disease.    Cardiac Rehab from 07/23/2017 in [facility] Cardiac and Pulmonary Rehab  Date  06/13/17  Educator  AS  Instruction Review Code  1- Verbalizes Understanding      Aerobic Exercise & Resistance Training: - Gives group verbal and written instruction on the various components of exercise. Focuses on aerobic and resistive training programs and the benefits of this training and how to safely progress through these programs..   Cardiac Rehab from 07/23/2017 in [facility] Cardiac and Pulmonary Rehab  Date  06/18/17  Educator  [facility]  Instruction Review Code  1- Geologist, engineering, Balance, Mind/Body Relaxation: Provides group verbal/written  instruction on the benefits of flexibility and balance training, including mind/body exercise modes such as yoga, pilates and tai chi.  Demonstration and skill practice provided.   Cardiac Rehab from 07/23/2017 in [facility] Cardiac and Pulmonary Rehab  Date  06/25/17  Educator  Wynelle Fanny  Instruction Review Code  1- Verbalizes Understanding      Stress and Anxiety: - Provides group verbal and written instruction about the health risks of elevated stress and causes of high stress.  Discuss the correlation between heart/lung disease and anxiety and treatment options. Review healthy ways to manage with stress and anxiety.   Cardiac Rehab from 07/23/2017 in [facility] Cardiac and Pulmonary Rehab  Date  07/04/17  Educator  [facility]  Instruction Review Code  1- Verbalizes Understanding      Depression: - Provides group verbal and written instruction on the correlation between heart/lung disease and depressed mood, treatment options, and the stigmas associated with seeking treatment.   Cardiac Rehab from 07/23/2017 in [facility] Cardiac and Pulmonary Rehab  Date  06/20/17  Educator  [facility]  Instruction Review Code  1- Verbalizes Understanding      Anatomy & Physiology of the Heart: - Group verbal and written instruction and models provide basic cardiac anatomy and physiology, with the coronary electrical and arterial systems. Review of Valvular disease and Heart Failure   Cardiac Rehab from 07/23/2017 in [facility] Cardiac and Pulmonary Rehab  Date  07/02/17  Educator  CE  Instruction Review Code  1- Verbalizes Understanding      Cardiac Procedures: - Group verbal and written instruction to review commonly prescribed medications for heart disease. Reviews the medication, class of the drug, and side effects. Includes the steps to properly store meds and maintain the prescription regimen. (beta blockers and nitrates)   Cardiac Medications I: - Group verbal and written instruction to review commonly prescribed medications  for heart disease. Reviews the medication, class  of the drug, and side effects. Includes the steps to properly store meds and maintain the prescription regimen.   Cardiac Rehab from 07/23/2017 in [facility] Cardiac and Pulmonary Rehab  Date  07/09/17  Educator  CE  Instruction Review Code  1- Verbalizes Understanding      Cardiac Medications II: -Group verbal and written instruction to review commonly prescribed medications for heart disease. Reviews the medication, class of the drug, and side effects. (all other drug classes)   Cardiac Rehab from 07/23/2017 in [facility] Cardiac and Pulmonary Rehab  Date  06/27/17  Educator  [facility]  Instruction Review Code  1- Verbalizes Understanding       Go Sex-Intimacy & Heart Disease, Get SMART - Goal Setting: - Group verbal and written instruction through game format to discuss heart disease and the return to sexual intimacy. Provides group verbal and written material to discuss and apply goal setting through the application of the S.M.A.R.T. Method.   Other Matters of the Heart: - Provides group verbal, written materials and models to describe Stable Angina and Peripheral Artery. Includes description of the disease process and treatment options available to the cardiac patient.   Cardiac Rehab from 07/23/2017 in [facility] Cardiac and Pulmonary Rehab  Date  07/02/17  Educator  CE  Instruction Review Code  1- Verbalizes Understanding      Exercise & Equipment Safety: - Individual verbal instruction and demonstration of equipment use and safety with use of the equipment.   Cardiac Rehab from 07/23/2017 in [facility] Cardiac and Pulmonary Rehab  Date  06/04/17  Educator  [facility]  Instruction Review Code  1- Verbalizes Understanding      Infection Prevention: - Provides verbal and written material to individual with discussion of infection control including proper hand washing and proper equipment cleaning during exercise session.   Cardiac Rehab from 07/23/2017 in [facility]  Cardiac and Pulmonary Rehab  Date  06/04/17  Educator  [facility]  Instruction Review Code  1- Verbalizes Understanding      Falls Prevention: - Provides verbal and written material to individual with discussion of falls prevention and safety.   Cardiac Rehab from 07/23/2017 in [facility] Cardiac and Pulmonary Rehab  Date  06/04/17  Educator  [facility]  Instruction Review Code  1- Verbalizes Understanding      Diabetes: - Individual verbal and written instruction to review signs/symptoms of diabetes, desired ranges of glucose level fasting, after meals and with exercise. Acknowledge that pre and post exercise glucose checks will be done for 3 sessions at entry of program.   Know Your Numbers and Risk Factors: -Group verbal and written instruction about important numbers in your health.  Discussion of what are risk factors and how they play a role in the disease process.  Review of Cholesterol, Blood Pressure, Diabetes, and BMI and the role they play in your overall health.   Cardiac Rehab from 07/23/2017 in [facility] Cardiac and Pulmonary Rehab  Date  06/27/17  Educator  [facility]  Instruction Review Code  1- Verbalizes Understanding      Sleep Hygiene: -Provides group verbal and written instruction about how sleep can affect your health.  Define sleep hygiene, discuss sleep cycles and impact of sleep habits. Review good sleep hygiene tips.    Other: -Provides group and verbal instruction on various topics (see comments)   Knowledge Questionnaire Score: Knowledge Questionnaire Score - 06/04/17 1425      Knowledge Questionnaire Score   Pre Score  28/28  Core Components/Risk Factors/Patient Goals at Admission: Personal Goals and Risk Factors at Admission - 06/04/17 1404      Core Components/Risk Factors/Patient Goals on Admission   Improve shortness of breath with ADL's  Yes    Intervention  Provide education, individualized exercise plan and daily activity instruction to help decrease symptoms of  SOB with activities of daily living.    Expected Outcomes  Short Term: Improve cardiorespiratory fitness to achieve a reduction of symptoms when performing ADLs;Long Term: Be able to perform more ADLs without symptoms or delay the onset of symptoms    Heart Failure  Yes    Intervention  Provide a combined exercise and nutrition program that is supplemented with education, support and counseling about heart failure. Directed toward relieving symptoms such as shortness of breath, decreased exercise tolerance, and extremity edema.    Expected Outcomes  Improve functional capacity of life;Short term: Attendance in program 2-3 days a week with increased exercise capacity. Reported lower sodium intake. Reported increased fruit and vegetable intake. Reports medication compliance.;Short term: Daily weights obtained and reported for increase. Utilizing diuretic protocols set by physician.;Long term: Adoption of self-care skills and reduction of barriers for early signs and symptoms recognition and intervention leading to self-care maintenance.    Hypertension  Yes    Intervention  Provide education on lifestyle modifcations including regular physical activity/exercise, weight management, moderate sodium restriction and increased consumption of fresh fruit, vegetables, and low fat dairy, alcohol moderation, and smoking cessation.;Monitor prescription use compliance.    Expected Outcomes  Short Term: Continued assessment and intervention until BP is < 140/45m HG in hypertensive participants. < 130/821mHG in hypertensive participants with diabetes, heart failure or chronic kidney disease.;Long Term: Maintenance of blood pressure at goal levels.    Lipids  Yes    Intervention  Provide education and support for participant on nutrition & aerobic/resistive exercise along with prescribed medications to achieve LDL '70mg'$ , HDL >'40mg'$ .    Expected Outcomes  Short Term: Participant states understanding of desired cholesterol  values and is compliant with medications prescribed. Participant is following exercise prescription and nutrition guidelines.;Long Term: Cholesterol controlled with medications as prescribed, with individualized exercise RX and with personalized nutrition plan. Value goals: LDL < '70mg'$ , HDL > 40 mg.       Core Components/Risk Factors/Patient Goals Review:  Goals and Risk Factor Review    Row Name 07/23/17 1730             Core Components/Risk Factors/Patient Goals Review   Personal Goals Review  Improve shortness of breath with ADL's;Heart Failure;Lipids;Hypertension;Stress;Weight Management/Obesity       Review  Dennis Burns has noticed he can stand longer without getting tired since attending HT.  He is taking all meds as directed - Dr increased potassium.  He met with RD and is adding things to his diet to add calories.  His Dr wants him to maintain weight around 125.  [facility]       Expected Outcomes  Short - Dennis Burns will finish HT Long - Pt will maintain exercise to keep strength and stamina          Core Components/Risk Factors/Patient Goals at Discharge (Final Review):  Goals and Risk Factor Review - 07/23/17 1730      Core Components/Risk Factors/Patient Goals Review   Personal Goals Review  Improve shortness of breath with ADL's;Heart Failure;Lipids;Hypertension;Stress;Weight Management/Obesity    Review  Dennis Burns has noticed  he can stand longer without getting tired since attending HT.  He is taking all meds as directed - Dr increased potassium.  He met with RD and is adding things to his diet to add calories.  His Dr wants him to maintain weight around 125.  He plans to continue exercise at [facility]  Knee has been better today    Expected Outcomes  Short - Dennis Burns will finish HT Long - Pt will maintain exercise to keep strength and stamina       ITP Comments: ITP Comments    Row Name 06/04/17 1048 06/07/17 1722 06/27/17 0628  07/25/17 0629     ITP Comments  Medical review completed today. ITP sent to Dr Sabra Heck for review, changes as needed and signature. Documentation of the diagnosis can be found in [facility] 04/30/2017  First full day of exercise.   Dennis Burns did well today  30 day review. Continue with ITP unless directed changes per Medical Director     New to program  30 day review. Continue with ITP unless directed changes per Medical Director       Comments:

## 2017-07-25 NOTE — Assessment & Plan Note (Signed)
Will see Dr Candiss Norse next week May want to restart low dose ARB

## 2017-07-25 NOTE — Progress Notes (Signed)
Subjective:    Patient ID: Dennis Burns, male    DOB: 1929/02/17, 84 y.o.   MRN: [id number]  HPI Here with wife for follow up of CHF and multiple other medical conditions  Really not feeling better "I have zero energy in the morning" Chest and throat congestion all the time DOE with minimal activity---- even just bending over to put on shoes Voiding a lot Dry mouth Finishing up with cardiac rehab--now able to do laps around his court  Weighing daily 124-128# at home Breathing is better at the lower weight Still only taking the metolazone twice a week Takes supplemental potassium with this No edema  Voice is not right---very low volume No drainage or post nasal drip  Feels angry---but denies depression Wife thinks he is "discouraged"  Current Outpatient Medications on File Prior to Visit  Medication Sig Dispense Refill  . Alpha-Lipoic Acid 200 MG CAPS Take 1 capsule by mouth daily.    Marland Kitchen apixaban (ELIQUIS) 2.5 MG TABS tablet Take 1 tablet (2.5 mg total) by mouth 2 (two) times daily. 180 tablet 3  . atorvastatin (LIPITOR) 40 MG tablet Take 40 mg by mouth daily.    Marland Kitchen CALCIUM CITRATE PO Take 1 tablet by mouth daily. Takes 300mg  daily.    . carvedilol (COREG) 12.5 MG tablet Take 1 tablet (12.5 mg total) by mouth 2 (two) times daily with a meal. 60 tablet 0  . Cholecalciferol (VITAMIN D) 2000 units CAPS Take 4,000 Units by mouth daily.    . Coenzyme Q10 (CO Q10) 200 MG CAPS Take 1 capsule by mouth daily.    Marland Kitchen CRANBERRY EXTRACT PO Take 1 tablet by mouth daily. Takes 1500mg  daily    . ezetimibe (ZETIA) 10 MG tablet Take 1 tablet (10 mg total) by mouth daily. 90 tablet 4  . feeding supplement, ENSURE ENLIVE, (ENSURE ENLIVE) LIQD Take 237 mLs by mouth 2 (two) times daily between meals. 237 mL 12  . Ferrous Gluconate (IRON 27 PO) Take by mouth daily.    . furosemide (LASIX) 40 MG tablet Take 1.5 tablets (60 mg total) by mouth 2 (two) times daily. 135 tablet 3  . hydrALAZINE  (APRESOLINE) 10 MG tablet Take 1 tablet (10 mg total) by mouth 3 (three) times daily. 270 tablet 3  . isosorbide mononitrate (IMDUR) 60 MG 24 hr tablet Take 1 tablet (60 mg total) by mouth daily. 90 tablet 3  . latanoprost (XALATAN) 0.005 % ophthalmic solution Place 1 drop into both eyes at bedtime. In morning    . levothyroxine (SYNTHROID, LEVOTHROID) 75 MCG tablet Take 1 tablet (75 mcg total) by mouth daily. 90 tablet 3  . metolazone (ZAROXOLYN) 2.5 MG tablet Take 1 tablet (2.5 mg total) by mouth daily as needed. 90 tablet 3  . Multiple Vitamin (MULTIVITAMIN) tablet Take 1 tablet by mouth daily.    . nitroGLYCERIN (NITROSTAT) 0.4 MG SL tablet Place 1 tablet (0.4 mg total) under the tongue every 5 (five) minutes as needed for chest pain.  12  . Omega 3-6-9 Fatty Acids (OMEGA-3-6-9 PO) Take 1 capsule by mouth 2 (two) times daily.    . potassium chloride (K-DUR) 10 MEQ tablet Take 1 tablet (10 mEq total) by mouth as directed. Take 3 times daily and 4 pills when taking metolazone 360 tablet 3  . timolol (BETIMOL) 0.5 % ophthalmic solution Place 1 drop into both eyes 2 (two) times daily.    . traMADol (ULTRAM) 50 MG tablet Take 2 tablets (100 mg total)  by mouth 3 (three) times daily as needed. 30 tablet 2   No current facility-administered medications on file prior to visit.     No Known Allergies  Past Medical History:  Diagnosis Date  . Allergic rhinitis due to pollen    as child--better now  . Chronic kidney disease, stage III (moderate) (HCC)   . Coronary artery disease   . DVT, lower extremity, recurrent ([place])   . Glaucoma    [facility]   . History of prostate cancer 2004  . Hyperlipidemia   . Hypertension   . Hypothyroidism   . Impaired fasting glucose   . Spinal stenosis of lumbar region with radiculopathy     Past Surgical History:  Procedure Laterality Date  . CAROTID ENDARTERECTOMY Right 09/2004   [place] GRAFT  2003   [place]   after fall  . INSERTION PROSTATE RADIATION SEED  2004   and RT  . POSTERIOR LUMBAR FUSION  2012, 2014  . RIGHT/LEFT HEART CATH AND CORONARY ANGIOGRAPHY N/A 02/14/2017   Procedure: RIGHT/LEFT HEART CATH AND CORONARY ANGIOGRAPHY;  Surgeon: Wellington Hampshire, MD;  Location: [facility];  Service: Cardiovascular;  Laterality: N/A;    Family History  Problem Relation Age of Onset  . Cancer Mother        colon (cause of death) and breast  . Glaucoma Mother   . Cancer Father        colon  . Stroke Maternal Grandfather   . Diabetes Neg Hx     Social History   Socioeconomic History  . Marital status: Married    Spouse name: Not on file  . Number of children: 5  . Years of education: Not on file  . Highest education level: Not on file  Occupational History  . 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 Financial resource strain: Not on file  . Food insecurity:    Worry: Not on file    Inability: Not on file  . Transportation needs:    Medical: Not on file    Non-medical: Not on file  Tobacco Use  . Smoking status: Former Smoker    Packs/day: 0.25    Years: 18.00    Pack years: 4.50    Last attempt to quit: 1978    Years since quitting: 41.4  . Smokeless tobacco: Never Used  . Tobacco comment: Quit in 1978  Substance and Sexual Activity  . Alcohol use: Yes  . Drug use: No  . Sexual activity: Not on file  Lifestyle  . Physical activity:    Days per week: Not on file    Minutes per session: Not on file  . Stress: Not on file  Relationships  . Social connections:    Talks on phone: Not on file    Gets together: Not on file    Attends religious service: Not on file    Active member of club or organization: Not on file    Attends meetings of clubs or organizations: Not on file    Relationship status: Not on file  . Intimate partner violence:    Fear of current or ex partner: Not on file     Emotionally abused: Not on file    Physically abused: Not on file    Forced sexual activity: Not on file  Other Topics Concern  . Not on  file  Social History Narrative   2nd marriage--- 1980   5 children--2 step children   12 grandchildren   40 great grandchildren      Has living will   Wife is health care POA   Would accept resuscitation   Not sure about tube feeds   Review of Systems Some cramping in feet and toes--related to rehab. Took a week off and they are better Ears feel blocked---seen by audiologist Dr Rodena Medin and no findings Not sleeping well. Wedge pillow and then another one. Disturbed by nocturia No PND but often "wide awake" after getting up (will eventually get back to sleep) No heartburn Appetite is fair---good at breakfast, not as much after that No heartburn      Objective:   Physical Exam  Constitutional: He appears well-developed. No distress.  Neck: JVD present. No thyromegaly present.  Cardiovascular: Normal rate and regular rhythm.  Soft aortic systolic murmur ?faint S3 gallop  Respiratory: Effort normal and breath sounds normal. No respiratory distress. He has no wheezes. He has no rales.  GI: Soft. There is no tenderness.  Musculoskeletal: He exhibits no edema.  Lymphadenopathy:    He has no cervical adenopathy.           Assessment & Plan:

## 2017-07-25 NOTE — Assessment & Plan Note (Signed)
Seems to be compensated now Monitoring weight carefully Has improved exercise tolerance but still frustrated

## 2017-07-25 NOTE — Patient Instructions (Signed)
Please set up an appointment at [facility] to evaluate your hoarseness.

## 2017-07-25 NOTE — Progress Notes (Signed)
Daily Session Note  Patient Details  Name: Dennis Burns MRN: [id number] Date of Birth: 1928-08-10 Referring Provider:     Cardiac Rehab from 06/04/2017 in [facility]  Referring Provider  Ida Rogue MD      Encounter Date: 07/25/2017  Check In: Session Check In - 07/25/17 1732      Check-In   Location  [facility]    Staff Present  Gerlene Burdock, RN, BSN;Meredith Sherryll Burger, RN Vickki Hearing, BA, ACSM CEP, Exercise Physiologist    Supervising physician immediately available to respond to emergencies  See telemetry face sheet for immediately available ER MD    Medication changes reported      No    Fall or balance concerns reported     No    Warm-up and Cool-down  Performed on first and last piece of equipment    Resistance Training Performed  Yes    VAD Patient?  No      Pain Assessment   Currently in Pain?  No/denies    Multiple Pain Sites  No          Social History   Tobacco Use  Smoking Status Former Smoker  . Packs/day: 0.25  . Years: 18.00  . Pack years: 4.50  . Last attempt to quit: 1978  . Years since quitting: 41.4  Smokeless Tobacco Never Used  Tobacco Comment   Quit in 1978    Goals Met:  Independence with exercise equipment Exercise tolerated well No report of cardiac concerns or symptoms Strength training completed today  Goals Unmet:  Not Applicable  Comments:  [place] Name 06/04/17 1515 07/25/17 1800       6 Minute Walk   Phase  Initial  Discharge    Distance  1030 feet  1030 feet    Distance % Change  -  0 %    Distance Feet Change  -  0 ft    Walk Time  6 minutes  6 minutes    # of Rest Breaks  0  0    MPH  1.95  1.95    METS  1.98  1.78    RPE  15  15    Perceived Dyspnea   2  2    VO2 Peak  6.91  -    Symptoms  Yes (comment)  Yes (comment)    Comments  SOB  leg fatugue/back pain    Resting HR  86 bpm  84 bpm    Resting BP  130/64  124/64    Resting Oxygen  Saturation   98 %  98 %    Exercise Oxygen Saturation  during 6 min walk  96 %  100 %    Max Ex. HR  118 bpm  98 bpm    Max Ex. BP  144/66  138/62    2 Minute Post BP  124/62  -      Interval HR   1 Minute HR  112  -    2 Minute HR  117  -    3 Minute HR  119  -    4 Minute HR  117  -    5 Minute HR  119  -    6 Minute HR  119  -    2 Minute Post HR  97  -    Interval Heart Rate?  Yes  -  Interval Oxygen   Interval Oxygen?  Yes  -    Baseline Oxygen Saturation %  98 %  -    1 Minute Oxygen Saturation %  96 %  -    1 Minute Liters of Oxygen  0 L Room Air  -    2 Minute Oxygen Saturation %  97 %  -    2 Minute Liters of Oxygen  0 L  -    3 Minute Oxygen Saturation %  98 %  -    3 Minute Liters of Oxygen  0 L  -    4 Minute Oxygen Saturation %  98 %  -    4 Minute Liters of Oxygen  0 L  -    5 Minute Oxygen Saturation %  98 %  -    5 Minute Liters of Oxygen  0 L  -    6 Minute Oxygen Saturation %  96 %  -    6 Minute Liters of Oxygen  0 L  -    2 Minute Post Oxygen Saturation %  96 %  -    2 Minute Post Liters of Oxygen  0 L  -         Dr. Emily Filbert is Medical Director for [place] and [facility].

## 2017-07-25 NOTE — Assessment & Plan Note (Signed)
Some anhedonia and frustration Occasional sleep problems No MDD Discussed resuming some activities, exercise class Will hold off on meds for now

## 2017-07-26 DIAGNOSIS — I214 Non-ST elevation (NSTEMI) myocardial infarction: Secondary | ICD-10-CM | POA: Diagnosis not present

## 2017-07-26 NOTE — Patient Instructions (Signed)
Discharge Patient Instructions  Patient Details  Name: Dennis Burns MRN: [id number] Date of Birth: 1928-03-15 Referring Provider:  [name], MD   Number of Visits: to complete 36/36   Reason for Discharge:  Patient reached a stable level of exercise. Patient independent in their exercise. Patient has met program and personal goals.  Smoking History:  Social History   Tobacco Use  Smoking Status Former Smoker  . Packs/day: 0.25  . Years: 18.00  . Pack years: 4.50  . Last attempt to quit: 1978  . Years since quitting: 41.4  Smokeless Tobacco Never Used  Tobacco Comment   Quit in 1978    Diagnosis:  NSTEMI (non-ST elevated myocardial infarction) [facility])  Initial Exercise Prescription: Initial Exercise Prescription - 06/04/17 1500      Date of Initial Exercise RX and Referring Provider   Date  06/04/17    Referring Provider  Ida Rogue MD      Treadmill   MPH  1.3    Grade  0.5    Minutes  15    METs  2.09      NuStep   Level  2    SPM  80    Minutes  15    METs  2      Recumbant Elliptical   Level  1    RPM  50    Minutes  15    METs  2      Prescription Details   Frequency (times per week)  3    Duration  Progress to 45 minutes of aerobic exercise without signs/symptoms of physical distress      Intensity   THRR 40-80% of Max Heartrate  105-124    Ratings of Perceived Exertion  11-15    Perceived Dyspnea  0-4      Progression   Progression  Continue to progress workloads to maintain intensity without signs/symptoms of physical distress.      Resistance Training   Training Prescription  Yes    Weight  3 lbs    Reps  10-15       Discharge Exercise Prescription (Final Exercise Prescription Changes): Exercise Prescription Changes - 07/17/17 1500      Response to Exercise   Blood Pressure (Admit)  112/58    Blood Pressure (Exercise)  140/80    Blood Pressure (Exit)  120/60    Heart Rate (Admit)  103 bpm    Heart Rate  (Exercise)  113 bpm    Heart Rate (Exit)  98 bpm    Rating of Perceived Exertion (Exercise)  13    Symptoms  none    Duration  Continue with 45 min of aerobic exercise without signs/symptoms of physical distress.    Intensity  THRR unchanged      Progression   Progression  Continue to progress workloads to maintain intensity without signs/symptoms of physical distress.    Average METs  2.6      Resistance Training   Training Prescription  Yes    Weight  3 lb    Reps  10-15      Treadmill   MPH  1.3    Grade  0.5    Minutes  15    METs  2.17      NuStep   Level  2    SPM  80    Minutes  15    METs  2.6      Biostep-RELP   Level  1  SPM  50    Minutes  15    METs  3      Home Exercise Plan   Plans to continue exercise at  Home (comment) walking at home on off days of class    Frequency  Add 2 additional days to program exercise sessions.    Initial Home Exercises Provided  07/05/17       Functional Capacity: 6 Minute Walk    Row Name 06/04/17 1515 07/25/17 1800       6 Minute Walk   Phase  Initial  Discharge    Distance  1030 feet  1030 feet    Distance % Change  -  0 %    Distance Feet Change  -  0 ft    Walk Time  6 minutes  6 minutes    # of Rest Breaks  0  0    MPH  1.95  1.95    METS  1.98  1.78    RPE  15  15    Perceived Dyspnea   2  2    VO2 Peak  6.91  -    Symptoms  Yes (comment)  Yes (comment)    Comments  SOB  leg fatugue/back pain    Resting HR  86 bpm  84 bpm    Resting BP  130/64  124/64    Resting Oxygen Saturation   98 %  98 %    Exercise Oxygen Saturation  during 6 min walk  96 %  100 %    Max Ex. HR  118 bpm  98 bpm    Max Ex. BP  144/66  138/62    2 Minute Post BP  124/62  -      Interval HR   1 Minute HR  112  -    2 Minute HR  117  -    3 Minute HR  119  -    4 Minute HR  117  -    5 Minute HR  119  -    6 Minute HR  119  -    2 Minute Post HR  97  -    Interval Heart Rate?  Yes  -      Interval Oxygen   Interval  Oxygen?  Yes  -    Baseline Oxygen Saturation %  98 %  -    1 Minute Oxygen Saturation %  96 %  -    1 Minute Liters of Oxygen  0 L Room Air  -    2 Minute Oxygen Saturation %  97 %  -    2 Minute Liters of Oxygen  0 L  -    3 Minute Oxygen Saturation %  98 %  -    3 Minute Liters of Oxygen  0 L  -    4 Minute Oxygen Saturation %  98 %  -    4 Minute Liters of Oxygen  0 L  -    5 Minute Oxygen Saturation %  98 %  -    5 Minute Liters of Oxygen  0 L  -    6 Minute Oxygen Saturation %  96 %  -    6 Minute Liters of Oxygen  0 L  -    2 Minute Post Oxygen Saturation %  96 %  -    2 Minute Post Liters of Oxygen  0 L  -  Quality of Life: Quality of Life - 07/26/17 1044      Quality of Life Scores   Health/Function Post  22.14 %    Socioeconomic Post  28.75 %    Psych/Spiritual Post  26.64 %    Family Post  28.8 %    GLOBAL Post  25.4 %       Personal Goals: Goals established at orientation with interventions provided to work toward goal. Personal Goals and Risk Factors at Admission - 06/04/17 1404      Core Components/Risk Factors/Patient Goals on Admission   Improve shortness of breath with ADL's  Yes    Intervention  Provide education, individualized exercise plan and daily activity instruction to help decrease symptoms of SOB with activities of daily living.    Expected Outcomes  Short Term: Improve cardiorespiratory fitness to achieve a reduction of symptoms when performing ADLs;Long Term: Be able to perform more ADLs without symptoms or delay the onset of symptoms    Heart Failure  Yes    Intervention  Provide a combined exercise and nutrition program that is supplemented with education, support and counseling about heart failure. Directed toward relieving symptoms such as shortness of breath, decreased exercise tolerance, and extremity edema.    Expected Outcomes  Improve functional capacity of life;Short term: Attendance in program 2-3 days a week with increased  exercise capacity. Reported lower sodium intake. Reported increased fruit and vegetable intake. Reports medication compliance.;Short term: Daily weights obtained and reported for increase. Utilizing diuretic protocols set by physician.;Long term: Adoption of self-care skills and reduction of barriers for early signs and symptoms recognition and intervention leading to self-care maintenance.    Hypertension  Yes    Intervention  Provide education on lifestyle modifcations including regular physical activity/exercise, weight management, moderate sodium restriction and increased consumption of fresh fruit, vegetables, and low fat dairy, alcohol moderation, and smoking cessation.;Monitor prescription use compliance.    Expected Outcomes  Short Term: Continued assessment and intervention until BP is < 140/96m HG in hypertensive participants. < 130/819mHG in hypertensive participants with diabetes, heart failure or chronic kidney disease.;Long Term: Maintenance of blood pressure at goal levels.    Lipids  Yes    Intervention  Provide education and support for participant on nutrition & aerobic/resistive exercise along with prescribed medications to achieve LDL <7079mHDL >51m37m  Expected Outcomes  Short Term: Participant states understanding of desired cholesterol values and is compliant with medications prescribed. Participant is following exercise prescription and nutrition guidelines.;Long Term: Cholesterol controlled with medications as prescribed, with individualized exercise RX and with personalized nutrition plan. Value goals: LDL < 70mg60mL > 40 mg.        Personal Goals Discharge: Goals and Risk Factor Review - 07/23/17 1730      Core Components/Risk Factors/Patient Goals Review   Personal Goals Review  Improve shortness of breath with ADL's;Heart Failure;Lipids;Hypertension;Stress;Weight Management/Obesity    Review  Gene has noticed he can stand longer without getting tired since attending  HT.  He is taking all meds as directed - Dr increased potassium.  He met with RD and is adding things to his diet to add calories.  His Dr wants him to maintain weight around 125.  [facility]    Expected Outcomes  Short - gene will finish HT Long - Pt will maintain exercise to keep strength and stamina       Exercise  Goals and Review: Exercise Goals    Row Name 06/04/17 1519             Exercise Goals   Increase Physical Activity  Yes       Intervention  Develop an individualized exercise prescription for aerobic and resistive training based on initial evaluation findings, risk stratification, comorbidities and participant's personal goals.;Provide advice, education, support and counseling about physical activity/exercise needs.       Expected Outcomes  Short Term: Attend rehab on a regular basis to increase amount of physical activity.;Long Term: Add in home exercise to make exercise part of routine and to increase amount of physical activity.;Long Term: Exercising regularly at least 3-5 days a week.       Increase Strength and Stamina  Yes       Intervention  Provide advice, education, support and counseling about physical activity/exercise needs.;Develop an individualized exercise prescription for aerobic and resistive training based on initial evaluation findings, risk stratification, comorbidities and participant's personal goals.       Expected Outcomes  Short Term: Increase workloads from initial exercise prescription for resistance, speed, and METs.;Short Term: Perform resistance training exercises routinely during rehab and add in resistance training at home;Long Term: Improve cardiorespiratory fitness, muscular endurance and strength as measured by increased METs and functional capacity (6MWT)       Able to understand and use rate of perceived exertion (RPE) scale  Yes       Intervention  Provide education and explanation on  how to use RPE scale       Expected Outcomes  Short Term: Able to use RPE daily in rehab to express subjective intensity level;Long Term:  Able to use RPE to guide intensity level when exercising independently       Able to understand and use Dyspnea scale  Yes       Intervention  Provide education and explanation on how to use Dyspnea scale       Expected Outcomes  Short Term: Able to use Dyspnea scale daily in rehab to express subjective sense of shortness of breath during exertion;Long Term: Able to use Dyspnea scale to guide intensity level when exercising independently       Knowledge and understanding of Target Heart Rate Range (THRR)  Yes       Intervention  Provide education and explanation of THRR including how the numbers were predicted and where they are located for reference       Expected Outcomes  Short Term: Able to state/look up THRR;Short Term: Able to use daily as guideline for intensity in rehab;Long Term: Able to use THRR to govern intensity when exercising independently       Able to check pulse independently  Yes       Intervention  Provide education and demonstration on how to check pulse in carotid and radial arteries.;Review the importance of being able to check your own pulse for safety during independent exercise       Expected Outcomes  Short Term: Able to explain why pulse checking is important during independent exercise;Long Term: Able to check pulse independently and accurately       Understanding of Exercise Prescription  Yes       Intervention  Provide education, explanation, and written materials on patient's individual exercise prescription       Expected Outcomes  Short Term: Able to explain program exercise prescription;Long Term: Able to explain home exercise prescription to exercise independently  Nutrition & Weight - Outcomes: Pre Biometrics - 06/04/17 1519      Pre Biometrics   Height  5' 3.3" (1.608 m)    Weight  134 lb 8 oz (61 kg)    Waist  Circumference  33 inches    Hip Circumference  35 inches    Waist to Hip Ratio  0.94 %    BMI (Calculated)  23.6    Single Leg Stand  5.1 seconds      Post Biometrics - 07/25/17 1759       Post  Biometrics   Height  5' 3.3" (1.608 m)    Weight  130 lb 4.8 oz (59.1 kg)    Waist Circumference  34.5 inches    Hip Circumference  35 inches    Waist to Hip Ratio  0.99 %    BMI (Calculated)  22.86    Single Leg Stand  10.12 seconds       Nutrition: Nutrition Therapy & Goals - 06/21/17 1803      Nutrition Therapy   Diet  DASH    Drug/Food Interactions  Statins/Certain Fruits    Protein (specify units)  6    Fiber  30 grams    Whole Grain Foods  3 servings    Saturated Fats  12 max. grams    Fruits and Vegetables  4 servings/day 8 ideal    Sodium  2000 grams      Personal Nutrition Goals   Nutrition Goal  Consume foods high in iron using the high-iron food list provided due to mild anemia    Personal Goal #2  Consume your high-calorie breakfast smoothie and 1 Ensure daily to help maintain or gain weight He has lost weight and has had trouble maintaining weight since his hospitalization    Personal Goal #3  Look for ways to add calories to meals and snacks. For example, choose high-calorie snacks like dried fruit or full-fat cottage cheese and use whole milk instead of water when cooking      Intervention Plan   Intervention  Prescribe, educate and counsel regarding individualized specific dietary modifications aiming towards targeted core components such as weight, hypertension, lipid management, diabetes, heart failure and other comorbidities.;Nutrition handout(s) given to patient. low sodium diet handout    Expected Outcomes  Short Term Goal: Understand basic principles of dietary content, such as calories, fat, sodium, cholesterol and nutrients.;Short Term Goal: A plan has been developed with personal nutrition goals set during dietitian appointment.;Long Term Goal: Adherence to  prescribed nutrition plan.       Nutrition Discharge: Nutrition Assessments - 07/26/17 1240      MEDFICTS Scores   Post Score  54       Education Questionnaire Score: Knowledge Questionnaire Score - 07/26/17 1241      Knowledge Questionnaire Score   Post Score  26       Goals reviewed with patient; copy given to patient.

## 2017-07-26 NOTE — Progress Notes (Signed)
Daily Session Note  Patient Details  Name: Dennis Burns MRN: [id number] Date of Birth: [birth year] Referring Provider:     Cardiac Rehab from 06/04/2017 in [facility] Cardiac and Pulmonary Rehab  Referring Provider  Ida Rogue MD      Encounter Date: 07/26/2017  Check In: Session Check In - 07/26/17 1651      Check-In   Location  [facility]-Cardiac & Pulmonary Rehab    Staff Present  Heath Lark, RN, BSN, Laveda Norman, BS, ACSM CEP, Exercise Physiologist;Amanda Oletta Darter, [place], ACSM CEP, Exercise Physiologist    Supervising physician immediately available to respond to emergencies  See telemetry face sheet for immediately available ER MD    Medication changes reported      No    Fall or balance concerns reported     No    Warm-up and Cool-down  Performed on first and last piece of equipment    Resistance Training Performed  Yes    VAD Patient?  No      Pain Assessment   Currently in Pain?  No/denies    Multiple Pain Sites  No          Social History   Tobacco Use  Smoking Status Former Smoker  . Packs/day: 0.25  . Years: 18.00  . Pack years: 4.50  . Last attempt to quit: 1978  . Years since quitting: 41.4  Smokeless Tobacco Never Used  Tobacco Comment   Quit in 1978    Goals Met:  Independence with exercise equipment Exercise tolerated well No report of cardiac concerns or symptoms Strength training completed today  Goals Unmet:  Not Applicable  Comments: Pt able to follow exercise prescription today without complaint.  Will continue to monitor for progression.    Dr. Emily Filbert is Medical Director for [facility].

## 2017-08-01 ENCOUNTER — Encounter: Payer: Medicare PPO | Admitting: *Deleted

## 2017-08-01 DIAGNOSIS — I214 Non-ST elevation (NSTEMI) myocardial infarction: Secondary | ICD-10-CM

## 2017-08-01 NOTE — Progress Notes (Signed)
Daily Session Note  Patient Details  Name: Jakyle Petrucelli MRN: [id number] Date of Birth: [birth year] Referring Provider:     Cardiac Rehab from 06/04/2017 in [facility] Cardiac and Pulmonary Rehab  Referring Provider  Ida Rogue MD      Encounter Date: 08/01/2017  Check In: Session Check In - 08/01/17 1625      Check-In   Location  [facility]-Cardiac & Pulmonary Rehab    Staff Present  Alberteen Sam, MA, RCEP, CCRP, Exercise Physiologist;Meredith Sherryll Burger, RN Darra Lis, RN BSN    Supervising physician immediately available to respond to emergencies  See telemetry face sheet for immediately available ER MD    Medication changes reported      No    Fall or balance concerns reported     No    Tobacco Cessation  No Change    Warm-up and Cool-down  Performed on first and last piece of equipment    Resistance Training Performed  Yes    VAD Patient?  No      Pain Assessment   Currently in Pain?  No/denies          Social History   Tobacco Use  Smoking Status Former Smoker  . Packs/day: 0.25  . Years: 18.00  . Pack years: 4.50  . Last attempt to quit: 1978  . Years since quitting: 41.4  Smokeless Tobacco Never Used  Tobacco Comment   Quit in 1978    Goals Met:  Independence with exercise equipment Exercise tolerated well No report of cardiac concerns or symptoms Strength training completed today  Goals Unmet:  Not Applicable  Comments: Pt able to follow exercise prescription today without complaint.  Will continue to monitor for progression.    Dr. Emily Filbert is Medical Director for [facility] [facility].

## 2017-08-02 ENCOUNTER — Encounter: Payer: Medicare PPO | Admitting: *Deleted

## 2017-08-02 DIAGNOSIS — I214 Non-ST elevation (NSTEMI) myocardial infarction: Secondary | ICD-10-CM | POA: Diagnosis not present

## 2017-08-02 NOTE — Progress Notes (Signed)
Cardiac Individual Treatment Plan  Patient Details  Name: Dennis Burns MRN: [id number] Date of Birth: [birth year] Referring Provider:     Cardiac Rehab from 06/04/2017 in [facility] Cardiac and Pulmonary Rehab  Referring Provider  Ida Rogue MD      Initial Encounter Date:    Cardiac Rehab from 06/04/2017 in [facility] Cardiac and Pulmonary Rehab  Date  06/04/17  Referring Provider  Ida Rogue MD      Visit Diagnosis: NSTEMI (non-ST elevated myocardial infarction) [facility])  Patient's Home Medications on Admission:  Current Outpatient Medications:  .  Alpha-Lipoic Acid 200 MG CAPS, Take 1 capsule by mouth daily., Disp: , Rfl:  .  apixaban (ELIQUIS) 2.5 MG TABS tablet, Take 1 tablet (2.5 mg total) by mouth 2 (two) times daily., Disp: 180 tablet, Rfl: 3 .  atorvastatin (LIPITOR) 40 MG tablet, Take 40 mg by mouth daily., Disp: , Rfl:  .  CALCIUM CITRATE PO, Take 1 tablet by mouth daily. Takes 336m daily., Disp: , Rfl:  .  carvedilol (COREG) 12.5 MG tablet, Take 1 tablet (12.5 mg total) by mouth 2 (two) times daily with a meal., Disp: 60 tablet, Rfl: 0 .  Cholecalciferol (VITAMIN D) 2000 units CAPS, Take 4,000 Units by mouth daily., Disp: , Rfl:  .  Coenzyme Q10 (CO Q10) 200 MG CAPS, Take 1 capsule by mouth daily., Disp: , Rfl:  .  CRANBERRY EXTRACT PO, Take 1 tablet by mouth daily. Takes 15064mdaily, Disp: , Rfl:  .  ezetimibe (ZETIA) 10 MG tablet, Take 1 tablet (10 mg total) by mouth daily., Disp: 90 tablet, Rfl: 4 .  feeding supplement, ENSURE ENLIVE, (ENSURE ENLIVE) LIQD, Take 237 mLs by mouth 2 (two) times daily between meals., Disp: 237 mL, Rfl: 12 .  Ferrous Gluconate (IRON 27 PO), Take by mouth daily., Disp: , Rfl:  .  furosemide (LASIX) 40 MG tablet, Take 1.5 tablets (60 mg total) by mouth 2 (two) times daily., Disp: 135 tablet, Rfl: 3 .  hydrALAZINE (APRESOLINE) 10 MG tablet, Take 1 tablet (10 mg total) by mouth 3 (three) times daily., Disp: 270 tablet, Rfl: 3 .  isosorbide  mononitrate (IMDUR) 60 MG 24 hr tablet, Take 1 tablet (60 mg total) by mouth daily., Disp: 90 tablet, Rfl: 3 .  latanoprost (XALATAN) 0.005 % ophthalmic solution, Place 1 drop into both eyes at bedtime. In morning, Disp: , Rfl:  .  levothyroxine (SYNTHROID, LEVOTHROID) 75 MCG tablet, Take 1 tablet (75 mcg total) by mouth daily., Disp: 90 tablet, Rfl: 3 .  metolazone (ZAROXOLYN) 2.5 MG tablet, Take 1 tablet (2.5 mg total) by mouth daily as needed., Disp: 90 tablet, Rfl: 3 .  Multiple Vitamin (MULTIVITAMIN) tablet, Take 1 tablet by mouth daily., Disp: , Rfl:  .  nitroGLYCERIN (NITROSTAT) 0.4 MG SL tablet, Place 1 tablet (0.4 mg total) under the tongue every 5 (five) minutes as needed for chest pain., Disp: , Rfl: 12 .  Omega 3-6-9 Fatty Acids (OMEGA-3-6-9 PO), Take 1 capsule by mouth 2 (two) times daily., Disp: , Rfl:  .  potassium chloride (K-DUR) 10 MEQ tablet, Take 1 tablet (10 mEq total) by mouth as directed. Take 3 times daily and 4 pills when taking metolazone, Disp: 360 tablet, Rfl: 3 .  timolol (BETIMOL) 0.5 % ophthalmic solution, Place 1 drop into both eyes 2 (two) times daily., Disp: , Rfl:  .  traMADol (ULTRAM) 50 MG tablet, Take 2 tablets (100 mg total) by mouth 3 (three) times daily as needed., Disp:  30 tablet, Rfl: 2  Past Medical History: Past Medical History:  Diagnosis Date  . Allergic rhinitis due to pollen    as child--better now  . Chronic kidney disease, stage III (moderate) (HCC)   . Coronary artery disease   . DVT, lower extremity, recurrent ([place])   . Glaucoma    [facility]   . History of prostate cancer 2004  . Hyperlipidemia   . Hypertension   . Hypothyroidism   . Impaired fasting glucose   . Spinal stenosis of lumbar region with radiculopathy     Tobacco Use: Social History   Tobacco Use  Smoking Status Former Smoker  . Packs/day: 0.25  . Years: 18.00  . Pack years: 4.50  . Last attempt to quit: 1978  . Years since quitting: 41.4  Smokeless Tobacco  Never Used  Tobacco Comment   Quit in 1978    Labs: Recent Review Flowsheet Data    Labs for ITP Cardiac and Pulmonary Rehab Latest Ref Rng & Units 02/07/2016 02/14/2017 02/14/2017 05/01/2017   Cholestrol 0 - 200 mg/dL 136 - - 114   LDLCALC 0 - 99 mg/dL 64 - - 58   HDL >40 mg/dL 54 - - 34(L)   Trlycerides <150 mg/dL 92 - - 109   PHART 7.350 - 7.450 - - 7.386 7.44   PCO2ART 32.0 - 48.0 mmHg - - 33.9 29(L)   HCO3 20.0 - 28.0 mmol/L - 18.6(L) 20.3 19.7(L)   TCO2 22 - 32 mmol/L - 20(L) 21(L) -   ACIDBASEDEF 0.0 - 2.0 mmol/L - 5.0(H) 4.0(H) 3.2(H)   O2SAT % - 70.0 92.0 92.3       Exercise Target Goals:    Exercise Program Goal: Individual exercise prescription set using results from initial 6 min walk test and THRR while considering  patient's activity barriers and safety.   Exercise Prescription Goal: Initial exercise prescription builds to 30-45 minutes a day of aerobic activity, 2-3 days per week.  Home exercise guidelines will be given to patient during program as part of exercise prescription that the participant will acknowledge.  Activity Barriers & Risk Stratification: Activity Barriers & Cardiac Risk Stratification - 06/04/17 1400      Activity Barriers & Cardiac Risk Stratification   Activity Barriers  -- Chronic back and neck pain: controls with exercise and tramadol as needed.  2 spinal fusions Was very active       6 Minute Walk: 6 Minute Walk    Row Name 06/04/17 1515 07/25/17 1800       6 Minute Walk   Phase  Initial  Discharge    Distance  1030 feet  1030 feet    Distance % Change  -  0 %    Distance Feet Change  -  0 ft    Walk Time  6 minutes  6 minutes    # of Rest Breaks  0  0    MPH  1.95  1.95    METS  1.98  1.78    RPE  15  15    Perceived Dyspnea   2  2    VO2 Peak  6.91  -    Symptoms  Yes (comment)  Yes (comment)    Comments  SOB  leg fatugue/back pain    Resting HR  86 bpm  84 bpm    Resting BP  130/64  124/64    Resting Oxygen  Saturation   98 %  98 %  Exercise Oxygen Saturation  during 6 min walk  96 %  100 %    Max Ex. HR  118 bpm  98 bpm    Max Ex. BP  144/66  138/62    2 Minute Post BP  124/62  -      Interval HR   1 Minute HR  112  -    2 Minute HR  117  -    3 Minute HR  119  -    4 Minute HR  117  -    5 Minute HR  119  -    6 Minute HR  119  -    2 Minute Post HR  97  -    Interval Heart Rate?  Yes  -      Interval Oxygen   Interval Oxygen?  Yes  -    Baseline Oxygen Saturation %  98 %  -    1 Minute Oxygen Saturation %  96 %  -    1 Minute Liters of Oxygen  0 L Room Air  -    2 Minute Oxygen Saturation %  97 %  -    2 Minute Liters of Oxygen  0 L  -    3 Minute Oxygen Saturation %  98 %  -    3 Minute Liters of Oxygen  0 L  -    4 Minute Oxygen Saturation %  98 %  -    4 Minute Liters of Oxygen  0 L  -    5 Minute Oxygen Saturation %  98 %  -    5 Minute Liters of Oxygen  0 L  -    6 Minute Oxygen Saturation %  96 %  -    6 Minute Liters of Oxygen  0 L  -    2 Minute Post Oxygen Saturation %  96 %  -    2 Minute Post Liters of Oxygen  0 L  -       Oxygen Initial Assessment:   Oxygen Re-Evaluation:   Oxygen Discharge (Final Oxygen Re-Evaluation):   Initial Exercise Prescription: Initial Exercise Prescription - 06/04/17 1500      Date of Initial Exercise RX and Referring Provider   Date  06/04/17    Referring Provider  Ida Rogue MD      Treadmill   MPH  1.3    Grade  0.5    Minutes  15    METs  2.09      NuStep   Level  2    SPM  80    Minutes  15    METs  2      Recumbant Elliptical   Level  1    RPM  50    Minutes  15    METs  2      Prescription Details   Frequency (times per week)  3    Duration  Progress to 45 minutes of aerobic exercise without signs/symptoms of physical distress      Intensity   THRR 40-80% of Max Heartrate  105-124    Ratings of Perceived Exertion  11-15    Perceived Dyspnea  0-4      Progression   Progression  Continue  to progress workloads to maintain intensity without signs/symptoms of physical distress.      Resistance Training   Training Prescription  Yes    Weight  3 lbs  Reps  10-15       Perform Capillary Blood Glucose checks as needed.  Exercise Prescription Changes: Exercise Prescription Changes    Row Name 06/04/17 1300 06/20/17 1200 07/04/17 1500 07/05/17 1600 07/17/17 1500     Response to Exercise   Blood Pressure (Admit)  130/64  142/70  144/70  -  112/58   Blood Pressure (Exercise)  144/66  140/62  -  -  140/80   Blood Pressure (Exit)  124/62  146/62  128/68  -  120/60   Heart Rate (Admit)  86 bpm  80 bpm  121 bpm  -  103 bpm   Heart Rate (Exercise)  118 bpm  117 bpm  110 bpm  -  113 bpm   Heart Rate (Exit)  97 bpm  87 bpm  74 bpm  -  98 bpm   Oxygen Saturation (Admit)  98 %  -  -  -  -   Oxygen Saturation (Exercise)  96 %  -  -  -  -   Oxygen Saturation (Exit)  96 %  -  -  -  -   Rating of Perceived Exertion (Exercise)  _0 -  13   Perceived Dyspnea (Exercise)  2  -  -  -  -   Symptoms  SOB  none  none  -  none   Comments  walk test results  -  -  -  -   Duration  -  Progress to 45 minutes of aerobic exercise without signs/symptoms of physical distress  Continue with 45 min of aerobic exercise without signs/symptoms of physical distress.  Continue with 45 min of aerobic exercise without signs/symptoms of physical distress.  Continue with 45 min of aerobic exercise without signs/symptoms of physical distress.   Intensity  -  THRR unchanged  THRR unchanged  THRR unchanged  THRR unchanged     Progression   Progression  -  Continue to progress workloads to maintain intensity without signs/symptoms of physical distress.  Continue to progress workloads to maintain intensity without signs/symptoms of physical distress.  Continue to progress workloads to maintain intensity without signs/symptoms of physical distress.  Continue to progress workloads to maintain intensity without  signs/symptoms of physical distress.   Average METs  -  2.2  2.2  2.2  2.6     Resistance Training   Training Prescription  -  Yes  Yes  Yes  Yes   Weight  -  3 lbs  3 lb  3 lb  3 lb   Reps  -  10-15  10-15  10-15  10-15     Interval Training   Interval Training  -  No  No  No  -     Treadmill   MPH  -  1.3  1.3  1.3  1.3   Grade  -  0.5  0.5  0.5  0.5   Minutes  -  _1 METs  -  2.17  2.17  2.17  2.17     NuStep   Level  -  1  -  -  2   SPM  -  80  -  -  80   Minutes  -  15  -  -  15   METs  -  2.2  -  -  2.6     Biostep-RELP   Level  -  -  _0 SPM  -  -  50  50  50   Minutes  -  -  _1 METs  -  -  _2 Home Exercise Plan   Plans to continue exercise at  -  -  -  Home (comment) walking at home on off days of class  Home (comment) walking at home on off days of class   Frequency  -  -  -  Add 2 additional days to program exercise sessions.  Add 2 additional days to program exercise sessions.   Initial Home Exercises Provided  -  -  -  07/05/17  07/05/17      Exercise Comments: Exercise Comments    Row Name 06/07/17 1655 06/21/17 1714 08/02/17 1643       Exercise Comments   First full day of exercise!  Patient was oriented to gym and equipment including functions, settings, policies, and procedures.  Patient's individual exercise prescription and treatment plan were reviewed.  All starting workloads were established based on the results of the 6 minute walk test done at initial orientation visit.  The plan for exercise progression was also introduced and progression will be customized based on patient's performance and goals.  The REL was too difficult for Gene and bothered his knees so he was switched to the BioStep instead for that exercise station.   Handsome graduated today from  rehab with 36 sessions completed.  Details of the patient's exercise prescription and what He needs to do in order to continue the prescription and progress were  discussed with patient.  Patient was given a copy of prescription and goals.  Patient verbalized understanding.  Rocko plans to continue to exercise by attending his gym at [facility].        Exercise Goals and Review: Exercise Goals    Row Name 06/04/17 1519             Exercise Goals   Increase Physical Activity  Yes       Intervention  Develop an individualized exercise prescription for aerobic and resistive training based on initial evaluation findings, risk stratification, comorbidities and participant's personal goals.;Provide advice, education, support and counseling about physical activity/exercise needs.       Expected Outcomes  Short Term: Attend rehab on a regular basis to increase amount of physical activity.;Long Term: Add in home exercise to make exercise part of routine and to increase amount of physical activity.;Long Term: Exercising regularly at least 3-5 days a week.       Increase Strength and Stamina  Yes       Intervention  Provide advice, education, support and counseling about physical activity/exercise needs.;Develop an individualized exercise prescription for aerobic and resistive training based on initial evaluation findings, risk stratification, comorbidities and participant's personal goals.       Expected Outcomes  Short Term: Increase workloads from initial exercise prescription for resistance, speed, and METs.;Short Term: Perform resistance training exercises routinely during rehab and add in resistance training at home;Long Term: Improve cardiorespiratory fitness, muscular endurance and strength as measured by increased METs and functional capacity (6MWT)       Able to understand and use rate of perceived exertion (RPE) scale  Yes       Intervention  Provide education and explanation on how to use RPE scale       Expected Outcomes  Short Term: Able to use RPE daily in rehab to express subjective intensity level;Long Term:  Able to use RPE to guide intensity  level when exercising independently       Able to understand and use Dyspnea scale  Yes       Intervention  Provide education and explanation on how to use Dyspnea scale       Expected Outcomes  Short Term: Able to use Dyspnea scale daily in rehab to express subjective sense of shortness of breath during exertion;Long Term: Able to use Dyspnea scale to guide intensity level when exercising independently       Knowledge and understanding of Target Heart Rate Range (THRR)  Yes       Intervention  Provide education and explanation of THRR including how the numbers were predicted and where they are located for reference       Expected Outcomes  Short Term: Able to state/look up THRR;Short Term: Able to use daily as guideline for intensity in rehab;Long Term: Able to use THRR to govern intensity when exercising independently       Able to check pulse independently  Yes       Intervention  Provide education and demonstration on how to check pulse in carotid and radial arteries.;Review the importance of being able to check your own pulse for safety during independent exercise       Expected Outcomes  Short Term: Able to explain why pulse checking is important during independent exercise;Long Term: Able to check pulse independently and accurately       Understanding of Exercise Prescription  Yes       Intervention  Provide education, explanation, and written materials on patient's individual exercise prescription       Expected Outcomes  Short Term: Able to explain program exercise prescription;Long Term: Able to explain home exercise prescription to exercise independently          Exercise Goals Re-Evaluation : Exercise Goals Re-Evaluation    Row Name 06/07/17 1656 06/20/17 1210 07/04/17 1528 07/05/17 1700 07/17/17 1550     Exercise Goal Re-Evaluation   Exercise Goals Review  Understanding of Exercise Prescription;Knowledge and understanding of Target Heart Rate Range (THRR);Able to understand and use  rate of perceived exertion (RPE) scale;Increase Strength and Stamina;Increase Physical Activity  Increase Physical Activity;Able to understand and use rate of perceived exertion (RPE) scale;Knowledge and understanding of Target Heart Rate Range (THRR);Understanding of Exercise Prescription;Increase Strength and Stamina  Increase Physical Activity;Increase Strength and Stamina;Able to understand and use rate of perceived exertion (RPE) scale  Increase Physical Activity;Increase Strength and Stamina;Able to understand and use rate of perceived exertion (RPE) scale;Knowledge and understanding of Target Heart Rate Range (THRR);Able to understand and use Dyspnea scale;Understanding of Exercise Prescription;Able to check pulse independently  Increase Physical Activity;Able to understand and use rate of perceived exertion (RPE) scale;Increase Strength and Stamina;Able to understand and use Dyspnea scale   Comments  Reviewed RPE scale, THR and program prescription with pt today.  Pt voiced understanding and was given a copy of goals to take home.   Gene is reaching HR and RPE goals.  Staff will continue to monitor progress.  Gene is tolerating exercise well. Staff will monitor progress.  Home exercise guidelines reviewed with patient. He demostrated understanding of these guidelines.   Gene will miss class this week - feels like he has shin splints.  he has made good progress otherwise and imporved MET level.   Expected Outcomes  Short:  Use RPE daily to regulate intensity.  Long: Follow program prescription in THR.  Short - Pt will attend consistently Long - Pt will increase overall MET level  Short - review home exercise with Gene Long - improve overall MET level  Short: Add 1-2 days of walking at home in addition to attending Cardiac Rehab on a regular basis. Long: Increase fitness level and improve SOB with ADL's.   Short - Gene will attend regularly Long - Gene will continue to improve MET level      Discharge  Exercise Prescription (Final Exercise Prescription Changes): Exercise Prescription Changes - 07/17/17 1500      Response to Exercise   Blood Pressure (Admit)  112/58    Blood Pressure (Exercise)  140/80    Blood Pressure (Exit)  120/60    Heart Rate (Admit)  103 bpm    Heart Rate (Exercise)  113 bpm    Heart Rate (Exit)  98 bpm    Rating of Perceived Exertion (Exercise)  13    Symptoms  none    Duration  Continue with 45 min of aerobic exercise without signs/symptoms of physical distress.    Intensity  THRR unchanged      Progression   Progression  Continue to progress workloads to maintain intensity without signs/symptoms of physical distress.    Average METs  2.6      Resistance Training   Training Prescription  Yes    Weight  3 lb    Reps  10-15      Treadmill   MPH  1.3    Grade  0.5    Minutes  15    METs  2.17      NuStep   Level  2    SPM  80    Minutes  15    METs  2.6      Biostep-RELP   Level  1    SPM  50    Minutes  15    METs  3      Home Exercise Plan   Plans to continue exercise at  Home (comment) walking at home on off days of class    Frequency  Add 2 additional days to program exercise sessions.    Initial Home Exercises Provided  07/05/17       Nutrition:  Target Goals: Understanding of nutrition guidelines, daily intake of sodium <155m, cholesterol <2028m calories 30% from fat and 7% or less from saturated fats, daily to have 5 or more servings of fruits and vegetables.  Biometrics: Pre Biometrics - 06/04/17 1519      Pre Biometrics   Height  5' 3.3" (1.608 m)    Weight  134 lb 8 oz (61 kg)    Waist Circumference  33 inches    Hip Circumference  35 inches    Waist to Hip Ratio  0.94 %    BMI (Calculated)  23.6    Single Leg Stand  5.1 seconds      Post Biometrics - 07/25/17 1759       Post  Biometrics   Height  5' 3.3" (1.608 m)    Weight  130 lb 4.8 oz (59.1 kg)    Waist Circumference  34.5 inches    Hip Circumference  35  inches    Waist to Hip Ratio  0.99 %    BMI (Calculated)  22.86    Single Leg Stand  10.12 seconds       Nutrition  Therapy Plan and Nutrition Goals: Nutrition Therapy & Goals - 06/21/17 1803      Nutrition Therapy   Diet  DASH    Drug/Food Interactions  Statins/Certain Fruits    Protein (specify units)  6    Fiber  30 grams    Whole Grain Foods  3 servings    Saturated Fats  12 max. grams    Fruits and Vegetables  4 servings/day 8 ideal    Sodium  2000 grams      Personal Nutrition Goals   Nutrition Goal  Consume foods high in iron using the high-iron food list provided due to mild anemia    Personal Goal #2  Consume your high-calorie breakfast smoothie and 1 Ensure daily to help maintain or gain weight He has lost weight and has had trouble maintaining weight since his hospitalization    Personal Goal #3  Look for ways to add calories to meals and snacks. For example, choose high-calorie snacks like dried fruit or full-fat cottage cheese and use whole milk instead of water when cooking      Intervention Plan   Intervention  Prescribe, educate and counsel regarding individualized specific dietary modifications aiming towards targeted core components such as weight, hypertension, lipid management, diabetes, heart failure and other comorbidities.;Nutrition handout(s) given to patient. low sodium diet handout    Expected Outcomes  Short Term Goal: Understand basic principles of dietary content, such as calories, fat, sodium, cholesterol and nutrients.;Short Term Goal: A plan has been developed with personal nutrition goals set during dietitian appointment.;Long Term Goal: Adherence to prescribed nutrition plan.       Nutrition Assessments: Nutrition Assessments - 07/26/17 1240      MEDFICTS Scores   Post Score  54       Nutrition Goals Re-Evaluation: Nutrition Goals Re-Evaluation    Row Name 06/21/17 1807 06/21/17 1809 07/23/17 1728         Goals   Nutrition Goal  Look for  ways to add calories to meals and snacks. For example, choose high-calorie snacks like dried fruit or full-fat cottage cheese and use whole milk instead of water when cooking  -  Gene is having smoothies and Ensure to add calories.     Comment  He has had difficulty maintaining his weight in recent months  -  Gene is having smoothies and Ensure to add calories.  he is also having ice cream.     Expected Outcome  He will maintain or gain weight   -  Short - Gene will continue to eat healthy snacks to add calories Long - gene will maintain current weight at direction of cardiologist       Personal Goal #2 Re-Evaluation   Personal Goal #2  -  Consume your high-calorie breakfast smoothie and 1 Ensure daily to help maintain or gain weight  -       Personal Goal #3 Re-Evaluation   Personal Goal #3  -  Consume foods high in iron using the high-iron food list provided due to mild anemia  -        Nutrition Goals Discharge (Final Nutrition Goals Re-Evaluation): Nutrition Goals Re-Evaluation - 07/23/17 1728      Goals   Nutrition Goal  Gene is having smoothies and Ensure to add calories.    Comment  Gene is having smoothies and Ensure to add calories.  he is also having ice cream.    Expected Outcome  Short - Gene will continue to eat  healthy snacks to add calories Long - gene will maintain current weight at direction of cardiologist       Psychosocial: Target Goals: Acknowledge presence or absence of significant depression and/or stress, maximize coping skills, provide positive support system. Participant is able to verbalize types and ability to use techniques and skills needed for reducing stress and depression.   Initial Review & Psychosocial Screening: Initial Psych Review & Screening - 06/04/17 1412      Initial Review   Current issues with  Current Sleep Concerns;Current Stress Concerns    Source of Stress Concerns  Unable to participate in former interests or hobbies;Unable to perform  yard/household activities    Comments  Gene states that his Shortness of Breath gets in the way of him being able to perform his ADLs. He hopes this will improve and allow him to have more time to perform his ADls with less SOB.       Family Dynamics   Good Support System?  Yes Spouse, 7 children, grand children , relatives, and friends at [facility]      Barriers   Psychosocial barriers to participate in program  There are no identifiable barriers or psychosocial needs.;The patient should benefit from training in stress management and relaxation.      Screening Interventions   Interventions  Encouraged to exercise;To provide support and resources with identified psychosocial needs;Provide feedback about the scores to participant    Expected Outcomes  Short Term goal: Utilizing psychosocial counselor, staff and physician to assist with identification of specific Stressors or current issues interfering with healing process. Setting desired goal for each stressor or current issue identified.;Long Term Goal: Stressors or current issues are controlled or eliminated.;Short Term goal: Identification and review with participant of any Quality of Life or Depression concerns found by scoring the questionnaire.;Long Term goal: The participant improves quality of Life and PHQ9 Scores as seen by post scores and/or verbalization of changes       Quality of Life Scores:  Quality of Life - 07/26/17 1044      Quality of Life Scores   Health/Function Post  22.14 %    Socioeconomic Post  28.75 %    Psych/Spiritual Post  26.64 %    Family Post  28.8 %    GLOBAL Post  25.4 %      Scores of 19 and below usually indicate a poorer quality of life in these areas.  A difference of  2-3 points is a clinically meaningful difference.  A difference of 2-3 points in the total score of the Quality of Life Index has been associated with significant improvement in overall quality of life, self-image, physical symptoms, and  general health in studies assessing change in quality of life.  PHQ-9: Recent Review Flowsheet Data    Depression screen [facility] 2/9 07/26/2017 07/26/2017 06/04/2017 06/04/2017 05/03/2016   Decreased Interest 2 2 - 1 0   Down, Depressed, Hopeless 3 1 - 0 0   PHQ - 2 Score 5 3 - 1 0   Altered sleeping 3 3 (No Data)  3 -   Tired, decreased energy 2 3 - 3 -   Change in appetite 0 2 - 1 -   Feeling bad or failure about yourself  2 1 - 1 -   Trouble concentrating - 2 - 1 -   Moving slowly or fidgety/restless 0 0 - 0 -   Suicidal thoughts 0 0 - 0 -   PHQ-9 Score 12  14 - 10 -   Difficult doing work/chores Not difficult at all Not difficult at all - Not difficult at all -     Interpretation of Total Score  Total Score Depression Severity:  1-4 = Minimal depression, 5-9 = Mild depression, 10-14 = Moderate depression, 15-19 = Moderately severe depression, 20-27 = Severe depression   Psychosocial Evaluation and Intervention: Psychosocial Evaluation - 06/18/17 1710      Psychosocial Evaluation & Interventions   Interventions  Encouraged to exercise with the program and follow exercise prescription    Comments  Counselor met with Mr. Sunga today (Gene) for initial psychosocial evaluation.  He is an 84 year old who had a heart attack on 2/25.  Gene has a strong support system with his spouse; neighbors; his children; and active involvement in his local church.  He has had multiple health issues with CABGx5 in 2005 as well as diagnosed with prostate cancer that same year.  He also has had several back surgeries and since he was in the hospital with the most recent heart attack - he has lost his voice.  Gene states he has severe shortness of breath which makes him very tired easily - to talk or do minor activities.  He reports sleeping "okay" with multiple interruptions as he is on fluid pills and is up and down in the bathroom.  He has a poor appetite as well and has been losing weight.   Gene denies a history  of depression or anxiety or any current symptoms.   He has minimal stress in his life other than his health and that impacts everything.  Gene has goals to improve his breathing and have more energy; stamina and strength.  Counselor met with Ms. Gaida as well and she reports her husband is somewhat discouraged lately with his lack of energy - but she is not concerned about his mood being depressed in any way.  Counselor provided Ms. P with some information on a long-acting melatonin to ask the Dr. or pharmacist about to see if it might help with his chronic interrupted sleep patterns.  Staff will follow with Gene throughout the course of this program.      Expected Outcomes  Short:  Gene or Ms. P will ask the Dr. or pharmacist about a controlled release melatonin vs regular to possibly help improve sleep.  Long:  Gene will benefit from consistent exercise to improve his energy and possibly breathe better.      Continue Psychosocial Services   Follow up required by staff       Psychosocial Re-Evaluation: Psychosocial Re-Evaluation    [place] Name 07/23/17 1736             Psychosocial Re-Evaluation   Current issues with  Current Sleep Concerns       Comments  Gene doesnt sleep well due to fluid pills.  He does go back to sleep - just has to get up to use restroom.  This makes him tired in the morning.  He has talked his Dr and they suggested melatonin which helps       Expected Outcomes  Short - continue Dr recommendations for meds Long - pt will develop better sleep patterns and feel more energetic       Continue Psychosocial Services   Follow up required by staff          Psychosocial Discharge (Final Psychosocial Re-Evaluation): Psychosocial Re-Evaluation - 07/23/17 1736      Psychosocial Re-Evaluation  Current issues with  Current Sleep Concerns    Comments  Gene doesnt sleep well due to fluid pills.  He does go back to sleep - just has to get up to use restroom.  This makes him tired in  the morning.  He has talked his Dr and they suggested melatonin which helps    Expected Outcomes  Short - continue Dr recommendations for meds Long - pt will develop better sleep patterns and feel more energetic    Continue Psychosocial Services   Follow up required by staff       Vocational Rehabilitation: Provide vocational rehab assistance to qualifying candidates.   Vocational Rehab Evaluation & Intervention: Vocational Rehab - 06/04/17 1038      Initial Vocational Rehab Evaluation & Intervention   Assessment shows need for Vocational Rehabilitation  No       Education: Education Goals: Education classes will be provided on a variety of topics geared toward better understanding of heart health and risk factor modification. Participant will state understanding/return demonstration of topics presented as noted by education test scores.  Learning Barriers/Preferences: Learning Barriers/Preferences - 06/04/17 1424      Learning Barriers/Preferences   Learning Barriers  Hearing Bilateral hearing aids    Learning Preferences  Individual Instruction;Verbal Instruction;Group Instruction;Written Material       Education Topics:  AED/CPR: - Group verbal and written instruction with the use of models to demonstrate the basic use of the AED with the basic ABC's of resuscitation.   Cardiac Rehab from 08/01/2017 in [facility] Cardiac and Pulmonary Rehab  Date  07/25/17  Educator  CE  Instruction Review Code  1- Verbalizes Understanding      General Nutrition Guidelines/Fats and Fiber: -Group instruction provided by verbal, written material, models and posters to present the general guidelines for heart healthy nutrition. Gives an explanation and review of dietary fats and fiber.   Cardiac Rehab from 08/01/2017 in [facility] Cardiac and Pulmonary Rehab  Date  07/23/17  Educator  CR  Instruction Review Code  1- Verbalizes Understanding      Controlling Sodium/Reading Food Labels: -Group  verbal and written material supporting the discussion of sodium use in heart healthy nutrition. Review and explanation with models, verbal and written materials for utilization of the food label.   Exercise Physiology & General Exercise Guidelines: - Group verbal and written instruction with models to review the exercise physiology of the cardiovascular system and associated critical values. Provides general exercise guidelines with specific guidelines to those with heart or lung disease.    Cardiac Rehab from 08/01/2017 in [facility] Cardiac and Pulmonary Rehab  Date  06/13/17  Educator  AS  Instruction Review Code  1- Verbalizes Understanding      Aerobic Exercise & Resistance Training: - Gives group verbal and written instruction on the various components of exercise. Focuses on aerobic and resistive training programs and the benefits of this training and how to safely progress through these programs..   Cardiac Rehab from 08/01/2017 in [facility] Cardiac and Pulmonary Rehab  Date  06/18/17  Educator  [facility]  Instruction Review Code  1- Geologist, engineering, Balance, Mind/Body Relaxation: Provides group verbal/written instruction on the benefits of flexibility and balance training, including mind/body exercise modes such as yoga, pilates and tai chi.  Demonstration and skill practice provided.   Cardiac Rehab from 08/01/2017 in [facility] Cardiac and Pulmonary Rehab  Date  06/25/17  Educator  Wynelle Fanny  Instruction Review Code  1- Verbalizes Understanding      Stress and Anxiety: - Provides group verbal and written instruction about the health risks of elevated stress and causes of high stress.  Discuss the correlation between heart/lung disease and anxiety and treatment options. Review healthy ways to manage with stress and anxiety.   Cardiac Rehab from 08/01/2017 in [facility] Cardiac and Pulmonary Rehab  Date  07/04/17  Educator  [facility]  Instruction Review Code  1- Verbalizes Understanding       Depression: - Provides group verbal and written instruction on the correlation between heart/lung disease and depressed mood, treatment options, and the stigmas associated with seeking treatment.   Cardiac Rehab from 08/01/2017 in [facility] Cardiac and Pulmonary Rehab  Date  06/20/17  Educator  [facility]  Instruction Review Code  1- Verbalizes Understanding      Anatomy & Physiology of the Heart: - Group verbal and written instruction and models provide basic cardiac anatomy and physiology, with the coronary electrical and arterial systems. Review of Valvular disease and Heart Failure   Cardiac Rehab from 08/01/2017 in [facility] Cardiac and Pulmonary Rehab  Date  07/02/17  Educator  CE  Instruction Review Code  1- Verbalizes Understanding      Cardiac Procedures: - Group verbal and written instruction to review commonly prescribed medications for heart disease. Reviews the medication, class of the drug, and side effects. Includes the steps to properly store meds and maintain the prescription regimen. (beta blockers and nitrates)   Cardiac Medications I: - Group verbal and written instruction to review commonly prescribed medications for heart disease. Reviews the medication, class of the drug, and side effects. Includes the steps to properly store meds and maintain the prescription regimen.   Cardiac Rehab from 08/01/2017 in [facility] Cardiac and Pulmonary Rehab  Date  07/09/17  Educator  CE  Instruction Review Code  1- Verbalizes Understanding      Cardiac Medications II: -Group verbal and written instruction to review commonly prescribed medications for heart disease. Reviews the medication, class of the drug, and side effects. (all other drug classes)   Cardiac Rehab from 08/01/2017 in [facility] Cardiac and Pulmonary Rehab  Date  06/27/17  Educator  [facility]  Instruction Review Code  1- Verbalizes Understanding       Go Sex-Intimacy & Heart Disease, Get SMART - Goal Setting: - Group verbal and written  instruction through game format to discuss heart disease and the return to sexual intimacy. Provides group verbal and written material to discuss and apply goal setting through the application of the S.M.A.R.T. Method.   Other Matters of the Heart: - Provides group verbal, written materials and models to describe Stable Angina and Peripheral Artery. Includes description of the disease process and treatment options available to the cardiac patient.   Cardiac Rehab from 08/01/2017 in [facility] Cardiac and Pulmonary Rehab  Date  07/02/17  Educator  CE  Instruction Review Code  1- Verbalizes Understanding      Exercise & Equipment Safety: - Individual verbal instruction and demonstration of equipment use and safety with use of the equipment.   Cardiac Rehab from 08/01/2017 in [facility] Cardiac and Pulmonary Rehab  Date  06/04/17  Educator  [facility]  Instruction Review Code  1- Verbalizes Understanding      Infection Prevention: - Provides verbal and written material to individual with discussion of infection control including proper hand washing and proper equipment cleaning during exercise session.   Cardiac Rehab from 08/01/2017 in [facility] Cardiac and Pulmonary Rehab  Date  06/04/17  Educator  [facility]  Instruction Review Code  1- Verbalizes Understanding      Falls Prevention: - Provides verbal and written material to individual with discussion of falls prevention and safety.   Cardiac Rehab from 08/01/2017 in [facility] Cardiac and Pulmonary Rehab  Date  06/04/17  Educator  [facility]  Instruction Review Code  1- Verbalizes Understanding      Diabetes: - Individual verbal and written instruction to review signs/symptoms of diabetes, desired ranges of glucose level fasting, after meals and with exercise. Acknowledge that pre and post exercise glucose checks will be done for 3 sessions at entry of program.   Know Your Numbers and Risk Factors: -Group verbal and written instruction about important numbers in your  health.  Discussion of what are risk factors and how they play a role in the disease process.  Review of Cholesterol, Blood Pressure, Diabetes, and BMI and the role they play in your overall health.   Cardiac Rehab from 08/01/2017 in [facility] Cardiac and Pulmonary Rehab  Date  06/27/17  Educator  [facility]  Instruction Review Code  1- Verbalizes Understanding      Sleep Hygiene: -Provides group verbal and written instruction about how sleep can affect your health.  Define sleep hygiene, discuss sleep cycles and impact of sleep habits. Review good sleep hygiene tips.    Other: -Provides group and verbal instruction on various topics (see comments)   Knowledge Questionnaire Score: Knowledge Questionnaire Score - 07/26/17 1241      Knowledge Questionnaire Score   Post Score  26       Core Components/Risk Factors/Patient Goals at Admission: Personal Goals and Risk Factors at Admission - 06/04/17 1404      Core Components/Risk Factors/Patient Goals on Admission   Improve shortness of breath with ADL's  Yes    Intervention  Provide education, individualized exercise plan and daily activity instruction to help decrease symptoms of SOB with activities of daily living.    Expected Outcomes  Short Term: Improve cardiorespiratory fitness to achieve a reduction of symptoms when performing ADLs;Long Term: Be able to perform more ADLs without symptoms or delay the onset of symptoms    Heart Failure  Yes    Intervention  Provide a combined exercise and nutrition program that is supplemented with education, support and counseling about heart failure. Directed toward relieving symptoms such as shortness of breath, decreased exercise tolerance, and extremity edema.    Expected Outcomes  Improve functional capacity of life;Short term: Attendance in program 2-3 days a week with increased exercise capacity. Reported lower sodium intake. Reported increased fruit and vegetable intake. Reports medication  compliance.;Short term: Daily weights obtained and reported for increase. Utilizing diuretic protocols set by physician.;Long term: Adoption of self-care skills and reduction of barriers for early signs and symptoms recognition and intervention leading to self-care maintenance.    Hypertension  Yes    Intervention  Provide education on lifestyle modifcations including regular physical activity/exercise, weight management, moderate sodium restriction and increased consumption of fresh fruit, vegetables, and low fat dairy, alcohol moderation, and smoking cessation.;Monitor prescription use compliance.    Expected Outcomes  Short Term: Continued assessment and intervention until BP is < 140/74m HG in hypertensive participants. < 130/822mHG in hypertensive participants with diabetes, heart failure or chronic kidney disease.;Long Term: Maintenance of blood pressure at goal levels.    Lipids  Yes    Intervention  Provide education and support for participant on nutrition & aerobic/resistive exercise along  with prescribed medications to achieve LDL <64m, HDL >475m    Expected Outcomes  Short Term: Participant states understanding of desired cholesterol values and is compliant with medications prescribed. Participant is following exercise prescription and nutrition guidelines.;Long Term: Cholesterol controlled with medications as prescribed, with individualized exercise RX and with personalized nutrition plan. Value goals: LDL < 7015mHDL > 40 mg.       Core Components/Risk Factors/Patient Goals Review:  Goals and Risk Factor Review    Row Name 07/23/17 1730             Core Components/Risk Factors/Patient Goals Review   Personal Goals Review  Improve shortness of breath with ADL's;Heart Failure;Lipids;Hypertension;Stress;Weight Management/Obesity       Review  Gene has noticed he can stand longer without getting tired since attending HT.  He is taking all meds as directed - Dr increased potassium.   He met with RD and is adding things to his diet to add calories.  His Dr wants him to maintain weight around 125.  [facility] been better today       Expected Outcomes  Short - gene will finish HT Long - Pt will maintain exercise to keep strength and stamina          Core Components/Risk Factors/Patient Goals at Discharge (Final Review):  Goals and Risk Factor Review - 07/23/17 1730      Core Components/Risk Factors/Patient Goals Review   Personal Goals Review  Improve shortness of breath with ADL's;Heart Failure;Lipids;Hypertension;Stress;Weight Management/Obesity    Review  Gene has noticed he can stand longer without getting tired since attending HT.  He is taking all meds as directed - Dr increased potassium.  He met with RD and is adding things to his diet to add calories.  His Dr wants him to maintain weight around 125.  [facility]    Expected Outcomes  Short - gene will finish HT Long - Pt will maintain exercise to keep strength and stamina       ITP Comments: ITP Comments    Row Name 06/04/17 1048 06/07/17 1722 06/27/17 0628 07/25/17 0629     ITP Comments  Medical review completed today. ITP sent to Dr MilSabra Heckr review, changes as needed and signature. Documentation of the diagnosis can be found in [facility] 04/30/2017  First full day of exercise.   Gene did well today  30 day review. Continue with ITP unless directed changes per Medical Director     New to program  30 day review. Continue with ITP unless directed changes per Medical Director       Comments: Discharge ITP

## 2017-08-02 NOTE — Progress Notes (Signed)
Discharge Progress Report  Patient Details  Name: Dennis Burns MRN: [id number] Date of Birth: 1928-05-06 Referring Provider:     Cardiac Rehab from 06/04/2017 in [facility] Cardiac and Pulmonary Rehab  Referring Provider  Ida Rogue MD       Number of Visits: 36  Reason for Discharge:  Patient reached a stable level of exercise. Patient independent in their exercise. Patient has met program and personal goals.  Smoking History:  Social History   Tobacco Use  Smoking Status Former Smoker  . Packs/day: 0.25  . Years: 18.00  . Pack years: 4.50  . Last attempt to quit: 1978  . Years since quitting: 41.4  Smokeless Tobacco Never Used  Tobacco Comment   Quit in 1978    Diagnosis:  NSTEMI (non-ST elevated myocardial infarction) ([place])  ADL UCSD:   Initial Exercise Prescription: Initial Exercise Prescription - 06/04/17 1500      Date of Initial Exercise RX and Referring Provider   Date  06/04/17    Referring Provider  Ida Rogue MD      Treadmill   MPH  1.3    Grade  0.5    Minutes  15    METs  2.09      NuStep   Level  2    SPM  80    Minutes  15    METs  2      Recumbant Elliptical   Level  1    RPM  50    Minutes  15    METs  2      Prescription Details   Frequency (times per week)  3    Duration  Progress to 45 minutes of aerobic exercise without signs/symptoms of physical distress      Intensity   THRR 40-80% of Max Heartrate  105-124    Ratings of Perceived Exertion  11-15    Perceived Dyspnea  0-4      Progression   Progression  Continue to progress workloads to maintain intensity without signs/symptoms of physical distress.      Resistance Training   Training Prescription  Yes    Weight  3 lbs    Reps  10-15       Discharge Exercise Prescription (Final Exercise Prescription Changes): Exercise Prescription Changes - 07/17/17 1500      Response to Exercise   Blood Pressure (Admit)  112/58    Blood Pressure (Exercise)  140/80    Blood Pressure (Exit)  120/60    Heart Rate (Admit)  103 bpm    Heart Rate (Exercise)  113 bpm    Heart Rate (Exit)  98 bpm    Rating of Perceived Exertion (Exercise)  13    Symptoms  none    Duration  Continue with 45 min of aerobic exercise without signs/symptoms of physical distress.    Intensity  THRR unchanged      Progression   Progression  Continue to progress workloads to maintain intensity without signs/symptoms of physical distress.    Average METs  2.6      Resistance Training   Training Prescription  Yes    Weight  3 lb    Reps  10-15      Treadmill   MPH  1.3    Grade  0.5    Minutes  15    METs  2.17      NuStep   Level  2    SPM  80    Minutes  15    METs  2.6      Biostep-RELP   Level  1    SPM  50    Minutes  15    METs  3      Home Exercise Plan   Plans to continue exercise at  Home (comment) walking at home on off days of class    Frequency  Add 2 additional days to program exercise sessions.    Initial Home Exercises Provided  07/05/17       Functional Capacity: 6 Minute Walk    Row Name 06/04/17 1515 07/25/17 1800       6 Minute Walk   Phase  Initial  Discharge    Distance  1030 feet  1030 feet    Distance % Change  -  0 %    Distance Feet Change  -  0 ft    Walk Time  6 minutes  6 minutes    # of Rest Breaks  0  0    MPH  1.95  1.95    METS  1.98  1.78    RPE  15  15    Perceived Dyspnea   2  2    VO2 Peak  6.91  -    Symptoms  Yes (comment)  Yes (comment)    Comments  SOB  leg fatugue/back pain    Resting HR  86 bpm  84 bpm    Resting BP  130/64  124/64    Resting Oxygen Saturation   98 %  98 %    Exercise Oxygen Saturation  during 6 min walk  96 %  100 %    Max Ex. HR  118 bpm  98 bpm    Max Ex. BP  144/66  138/62    2 Minute Post BP  124/62  -      Interval HR   1 Minute HR  112  -    2 Minute HR  117  -    3 Minute HR  119  -    4 Minute HR  117  -    5 Minute HR  119  -    6 Minute HR  119  -    2 Minute Post HR   97  -    Interval Heart Rate?  Yes  -      Interval Oxygen   Interval Oxygen?  Yes  -    Baseline Oxygen Saturation %  98 %  -    1 Minute Oxygen Saturation %  96 %  -    1 Minute Liters of Oxygen  0 L Room Air  -    2 Minute Oxygen Saturation %  97 %  -    2 Minute Liters of Oxygen  0 L  -    3 Minute Oxygen Saturation %  98 %  -    3 Minute Liters of Oxygen  0 L  -    4 Minute Oxygen Saturation %  98 %  -    4 Minute Liters of Oxygen  0 L  -    5 Minute Oxygen Saturation %  98 %  -    5 Minute Liters of Oxygen  0 L  -    6 Minute Oxygen Saturation %  96 %  -    6 Minute Liters of Oxygen  0 L  -    2 Minute Post Oxygen  Saturation %  96 %  -    2 Minute Post Liters of Oxygen  0 L  -       Psychological, QOL, Others - Outcomes: PHQ 2/9: Depression screen [facility] 2/9 07/26/2017 07/26/2017 06/04/2017 06/04/2017 05/03/2016  Decreased Interest 2 2 - 1 0  Down, Depressed, Hopeless 3 1 - 0 0  PHQ - 2 Score 5 3 - 1 0  Altered sleeping 3 3 (No Data) 3 -  Tired, decreased energy 2 3 - 3 -  Change in appetite 0 2 - 1 -  Feeling bad or failure about yourself  2 1 - 1 -  Trouble concentrating - 2 - 1 -  Moving slowly or fidgety/restless 0 0 - 0 -  Suicidal thoughts 0 0 - 0 -  PHQ-9 Score 12 14 - 10 -  Difficult doing work/chores Not difficult at all Not difficult at all - Not difficult at all -    Quality of Life: Quality of Life - 07/26/17 1044      Quality of Life Scores   Health/Function Post  22.14 %    Socioeconomic Post  28.75 %    Psych/Spiritual Post  26.64 %    Family Post  28.8 %    GLOBAL Post  25.4 %       Personal Goals: Goals established at orientation with interventions provided to work toward goal. Personal Goals and Risk Factors at Admission - 06/04/17 1404      Core Components/Risk Factors/Patient Goals on Admission   Improve shortness of breath with ADL's  Yes    Intervention  Provide education, individualized exercise plan and daily activity instruction to help  decrease symptoms of SOB with activities of daily living.    Expected Outcomes  Short Term: Improve cardiorespiratory fitness to achieve a reduction of symptoms when performing ADLs;Long Term: Be able to perform more ADLs without symptoms or delay the onset of symptoms    Heart Failure  Yes    Intervention  Provide a combined exercise and nutrition program that is supplemented with education, support and counseling about heart failure. Directed toward relieving symptoms such as shortness of breath, decreased exercise tolerance, and extremity edema.    Expected Outcomes  Improve functional capacity of life;Short term: Attendance in program 2-3 days a week with increased exercise capacity. Reported lower sodium intake. Reported increased fruit and vegetable intake. Reports medication compliance.;Short term: Daily weights obtained and reported for increase. Utilizing diuretic protocols set by physician.;Long term: Adoption of self-care skills and reduction of barriers for early signs and symptoms recognition and intervention leading to self-care maintenance.    Hypertension  Yes    Intervention  Provide education on lifestyle modifcations including regular physical activity/exercise, weight management, moderate sodium restriction and increased consumption of fresh fruit, vegetables, and low fat dairy, alcohol moderation, and smoking cessation.;Monitor prescription use compliance.    Expected Outcomes  Short Term: Continued assessment and intervention until BP is < 140/55m HG in hypertensive participants. < 130/817mHG in hypertensive participants with diabetes, heart failure or chronic kidney disease.;Long Term: Maintenance of blood pressure at goal levels.    Lipids  Yes    Intervention  Provide education and support for participant on nutrition & aerobic/resistive exercise along with prescribed medications to achieve LDL <7084mHDL >84m22m  Expected Outcomes  Short Term: Participant states understanding  of desired cholesterol values and is compliant with medications prescribed. Participant is following exercise prescription and nutrition guidelines.;Long Term: Cholesterol  controlled with medications as prescribed, with individualized exercise RX and with personalized nutrition plan. Value goals: LDL < 66m, HDL > 40 mg.        Personal Goals Discharge: Goals and Risk Factor Review    Row Name 07/23/17 1730             Core Components/Risk Factors/Patient Goals Review   Personal Goals Review  Improve shortness of breath with ADL's;Heart Failure;Lipids;Hypertension;Stress;Weight Management/Obesity       Review  Gene has noticed he can stand longer without getting tired since attending HT.  He is taking all meds as directed - Dr increased potassium.  He met with RD and is adding things to his diet to add calories.  His Dr wants him to maintain weight around 125.  He plans to [facility] at [facility]  Knee has been better today       Expected Outcomes  Short - gene will finish HT Long - Pt will maintain exercise to keep strength and stamina          Exercise Goals and Review: Exercise Goals    Row Name 06/04/17 1519             Exercise Goals   Increase Physical Activity  Yes       Intervention  Develop an individualized exercise prescription for aerobic and resistive training based on initial evaluation findings, risk stratification, comorbidities and participant's personal goals.;Provide advice, education, support and counseling about physical activity/exercise needs.       Expected Outcomes  Short Term: Attend rehab on a regular basis to increase amount of physical activity.;Long Term: Add in home exercise to make exercise part of routine and to increase amount of physical activity.;Long Term: Exercising regularly at least 3-5 days a week.       Increase Strength and Stamina  Yes       Intervention  Provide advice, education, support and counseling about physical  activity/exercise needs.;Develop an individualized exercise prescription for aerobic and resistive training based on initial evaluation findings, risk stratification, comorbidities and participant's personal goals.       Expected Outcomes  Short Term: Increase workloads from initial exercise prescription for resistance, speed, and METs.;Short Term: Perform resistance training exercises routinely during rehab and add in resistance training at home;Long Term: Improve cardiorespiratory fitness, muscular endurance and strength as measured by increased METs and functional capacity (6MWT)       Able to understand and use rate of perceived exertion (RPE) scale  Yes       Intervention  Provide education and explanation on how to use RPE scale       Expected Outcomes  Short Term: Able to use RPE daily in rehab to express subjective intensity level;Long Term:  Able to use RPE to guide intensity level when exercising independently       Able to understand and use Dyspnea scale  Yes       Intervention  Provide education and explanation on how to use Dyspnea scale       Expected Outcomes  Short Term: Able to use Dyspnea scale daily in rehab to express subjective sense of shortness of breath during exertion;Long Term: Able to use Dyspnea scale to guide intensity level when exercising independently       Knowledge and understanding of Target Heart Rate Range (THRR)  Yes       Intervention  Provide education and explanation of THRR including how the numbers were predicted and where  they are located for reference       Expected Outcomes  Short Term: Able to state/look up THRR;Short Term: Able to use daily as guideline for intensity in rehab;Long Term: Able to use THRR to govern intensity when exercising independently       Able to check pulse independently  Yes       Intervention  Provide education and demonstration on how to check pulse in carotid and radial arteries.;Review the importance of being able to check your  own pulse for safety during independent exercise       Expected Outcomes  Short Term: Able to explain why pulse checking is important during independent exercise;Long Term: Able to check pulse independently and accurately       Understanding of Exercise Prescription  Yes       Intervention  Provide education, explanation, and written materials on patient's individual exercise prescription       Expected Outcomes  Short Term: Able to explain program exercise prescription;Long Term: Able to explain home exercise prescription to exercise independently          Nutrition & Weight - Outcomes: Pre Biometrics - 06/04/17 1519      Pre Biometrics   Height  5' 3.3" (1.608 m)    Weight  134 lb 8 oz (61 kg)    Waist Circumference  33 inches    Hip Circumference  35 inches    Waist to Hip Ratio  0.94 %    BMI (Calculated)  23.6    Single Leg Stand  5.1 seconds      Post Biometrics - 07/25/17 1759       Post  Biometrics   Height  5' 3.3" (1.608 m)    Weight  130 lb 4.8 oz (59.1 kg)    Waist Circumference  34.5 inches    Hip Circumference  35 inches    Waist to Hip Ratio  0.99 %    BMI (Calculated)  22.86    Single Leg Stand  10.12 seconds       Nutrition: Nutrition Therapy & Goals - 06/21/17 1803      Nutrition Therapy   Diet  DASH    Drug/Food Interactions  Statins/Certain Fruits    Protein (specify units)  6    Fiber  30 grams    Whole Grain Foods  3 servings    Saturated Fats  12 max. grams    Fruits and Vegetables  4 servings/day 8 ideal    Sodium  2000 grams      Personal Nutrition Goals   Nutrition Goal  Consume foods high in iron using the high-iron food list provided due to mild anemia    Personal Goal #2  Consume your high-calorie breakfast smoothie and 1 Ensure daily to help maintain or gain weight He has lost weight and has had trouble maintaining weight since his hospitalization    Personal Goal #3  Look for ways to add calories to meals and snacks. For example, choose  high-calorie snacks like dried fruit or full-fat cottage cheese and use whole milk instead of water when cooking      Intervention Plan   Intervention  Prescribe, educate and counsel regarding individualized specific dietary modifications aiming towards targeted core components such as weight, hypertension, lipid management, diabetes, heart failure and other comorbidities.;Nutrition handout(s) given to patient. low sodium diet handout    Expected Outcomes  Short Term Goal: Understand basic principles of dietary content, such as calories, fat,  sodium, cholesterol and nutrients.;Short Term Goal: A plan has been developed with personal nutrition goals set during dietitian appointment.;Long Term Goal: Adherence to prescribed nutrition plan.       Nutrition Discharge: Nutrition Assessments - 07/26/17 1240      MEDFICTS Scores   Post Score  54       Education Questionnaire Score: Knowledge Questionnaire Score - 07/26/17 1241      Knowledge Questionnaire Score   Post Score  26       Goals reviewed with patient; copy given to patient.

## 2017-08-02 NOTE — Progress Notes (Signed)
Daily Session Note  Patient Details  Name: Dennis Burns MRN: [id number] Date of Birth: Oct 15, 1928 Referring Provider:     Cardiac Rehab from 06/04/2017 in [facility] Cardiac and Pulmonary Rehab  Referring Provider  Ida Rogue MD      Encounter Date: 08/02/2017  Check In: Session Check In - 08/02/17 1641      Check-In   Location  [facility]-Cardiac & Pulmonary Rehab    Staff Present  Constance Goltz, RN BSN;Laureen Owens Shark, BS, RRT, Respiratory Therapist;Meredith Sherryll Burger, RN BSN    Supervising physician immediately available to respond to emergencies  See telemetry face sheet for immediately available ER MD    Medication changes reported      No    Fall or balance concerns reported     No    Tobacco Cessation  No Change    Warm-up and Cool-down  Performed on first and last piece of equipment    Resistance Training Performed  Yes    VAD Patient?  No      Pain Assessment   Currently in Pain?  No/denies          Social History   Tobacco Use  Smoking Status Former Smoker  . Packs/day: 0.25  . Years: 18.00  . Pack years: 4.50  . Last attempt to quit: 1978  . Years since quitting: 41.4  Smokeless Tobacco Never Used  Tobacco Comment   Quit in 1978    Goals Met:  Independence with exercise equipment Exercise tolerated well No report of cardiac concerns or symptoms Strength training completed today  Goals Unmet:  Not Applicable  Comments:  [name] graduated today from  rehab with 36 sessions completed.  Details of the patient's exercise prescription and what He needs to do in order to continue the prescription and progress were discussed with patient.  Patient was given a copy of prescription and goals.  Patient verbalized understanding.  Edge plans to continue to exercise by attending his gym at [facility].     Dr. Emily Filbert is Medical Director for [facility].

## 2017-08-06 ENCOUNTER — Encounter: Payer: Medicare PPO | Attending: Cardiovascular Disease

## 2017-08-06 DIAGNOSIS — I214 Non-ST elevation (NSTEMI) myocardial infarction: Secondary | ICD-10-CM | POA: Insufficient documentation

## 2017-08-10 DIAGNOSIS — I10 Essential (primary) hypertension: Secondary | ICD-10-CM | POA: Diagnosis not present

## 2017-08-10 DIAGNOSIS — R600 Localized edema: Secondary | ICD-10-CM | POA: Diagnosis not present

## 2017-08-10 DIAGNOSIS — R739 Hyperglycemia, unspecified: Secondary | ICD-10-CM | POA: Diagnosis not present

## 2017-08-10 DIAGNOSIS — N183 Chronic kidney disease, stage 3 (moderate): Secondary | ICD-10-CM | POA: Diagnosis not present

## 2017-08-10 DIAGNOSIS — N261 Atrophy of kidney (terminal): Secondary | ICD-10-CM | POA: Diagnosis not present

## 2017-08-12 ENCOUNTER — Encounter: Payer: Self-pay | Admitting: Cardiovascular Disease

## 2017-08-13 ENCOUNTER — Other Ambulatory Visit: Payer: Self-pay | Admitting: *Deleted

## 2017-08-13 MED ORDER — CARVEDILOL 12.5 MG PO TABS: 12.5000 mg | ORAL_TABLET | Freq: Two times a day (BID) | ORAL | 3 refills | Status: DC

## 2017-08-16 ENCOUNTER — Encounter: Payer: Self-pay | Admitting: Cardiovascular Disease

## 2017-08-16 ENCOUNTER — Other Ambulatory Visit: Payer: Self-pay | Admitting: Otolaryngology

## 2017-08-16 DIAGNOSIS — J3801 Paralysis of vocal cords and larynx, unilateral: Secondary | ICD-10-CM | POA: Diagnosis not present

## 2017-08-16 DIAGNOSIS — R490 Dysphonia: Secondary | ICD-10-CM | POA: Diagnosis not present

## 2017-08-16 DIAGNOSIS — R1313 Dysphagia, pharyngeal phase: Secondary | ICD-10-CM | POA: Diagnosis not present

## 2017-08-16 DIAGNOSIS — H6981 Other specified disorders of Eustachian tube, right ear: Secondary | ICD-10-CM | POA: Diagnosis not present

## 2017-08-20 ENCOUNTER — Other Ambulatory Visit: Payer: Self-pay | Admitting: Otolaryngology

## 2017-08-20 DIAGNOSIS — R1310 Dysphagia, unspecified: Secondary | ICD-10-CM

## 2017-08-20 DIAGNOSIS — J3800 Paralysis of vocal cords and larynx, unspecified: Secondary | ICD-10-CM

## 2017-08-21 ENCOUNTER — Ambulatory Visit
Admission: RE | Admit: 2017-08-21 | Discharge: 2017-08-21 | Disposition: A | Discharge status: Home / Self Care | Payer: Medicare PPO | Source: Ambulatory Visit | Attending: Otolaryngology | Admitting: Otolaryngology

## 2017-08-21 ENCOUNTER — Ambulatory Visit: Payer: Medicare PPO

## 2017-08-21 ENCOUNTER — Ambulatory Visit: Payer: Medicare PPO | Admitting: Cardiovascular Disease

## 2017-08-21 DIAGNOSIS — R601 Generalized edema: Secondary | ICD-10-CM | POA: Diagnosis not present

## 2017-08-21 DIAGNOSIS — N261 Atrophy of kidney (terminal): Secondary | ICD-10-CM | POA: Diagnosis not present

## 2017-08-21 DIAGNOSIS — J3800 Paralysis of vocal cords and larynx, unspecified: Secondary | ICD-10-CM | POA: Insufficient documentation

## 2017-08-21 DIAGNOSIS — I10 Essential (primary) hypertension: Secondary | ICD-10-CM | POA: Diagnosis not present

## 2017-08-21 DIAGNOSIS — J90 Pleural effusion, not elsewhere classified: Secondary | ICD-10-CM | POA: Diagnosis not present

## 2017-08-21 DIAGNOSIS — N183 Chronic kidney disease, stage 3 (moderate): Secondary | ICD-10-CM | POA: Diagnosis not present

## 2017-08-21 DIAGNOSIS — J3801 Paralysis of vocal cords and larynx, unilateral: Secondary | ICD-10-CM

## 2017-08-21 DIAGNOSIS — I700 Atherosclerosis of aorta: Secondary | ICD-10-CM | POA: Insufficient documentation

## 2017-08-21 DIAGNOSIS — I6522 Occlusion and stenosis of left carotid artery: Secondary | ICD-10-CM | POA: Diagnosis not present

## 2017-08-21 HISTORY — DX: Malignant neoplasm of prostate: C61

## 2017-08-21 MED ORDER — IOPAMIDOL (ISOVUE-300) INJECTION 61%
60.0000 mL | Freq: Once | INTRAVENOUS | Status: AC | PRN
  Administered 2017-08-21: 60 mL via INTRAVENOUS

## 2017-08-22 ENCOUNTER — Encounter: Payer: Self-pay | Admitting: Internal Medicine

## 2017-08-23 ENCOUNTER — Ambulatory Visit: Payer: Medicare PPO | Admitting: Cardiovascular Disease

## 2017-08-23 MED ORDER — TRAMADOL HCL 50 MG PO TABS: 100.0000 mg | ORAL_TABLET | Freq: Three times a day (TID) | ORAL | 2 refills | Status: DC | PRN

## 2017-08-23 NOTE — Telephone Encounter (Signed)
Last written to mail order 05-10-17 #180/2  Last OV 07-25-17 Next OV 11-12-17  [facility]

## 2017-08-24 DIAGNOSIS — H903 Sensorineural hearing loss, bilateral: Secondary | ICD-10-CM | POA: Diagnosis not present

## 2017-08-24 DIAGNOSIS — J3801 Paralysis of vocal cords and larynx, unilateral: Secondary | ICD-10-CM | POA: Diagnosis not present

## 2017-08-24 DIAGNOSIS — R1313 Dysphagia, pharyngeal phase: Secondary | ICD-10-CM | POA: Diagnosis not present

## 2017-08-27 ENCOUNTER — Encounter: Payer: Self-pay | Admitting: Internal Medicine

## 2017-08-27 MED ORDER — LEVOTHYROXINE SODIUM 75 MCG PO TABS: 75.0000 ug | ORAL_TABLET | Freq: Every day | ORAL | 3 refills | Status: DC

## 2017-09-03 ENCOUNTER — Encounter: Payer: Medicare PPO | Attending: Cardiovascular Disease

## 2017-09-03 DIAGNOSIS — I214 Non-ST elevation (NSTEMI) myocardial infarction: Secondary | ICD-10-CM | POA: Insufficient documentation

## 2017-09-04 NOTE — Progress Notes (Signed)
Dennis [facility]      Assessment and Plan:  Dyspnea on exertion which is multifactorial from deconditioning, CKD, Systolic CHF, pleural effusion, vocal cord paralysis.  --Continue diuresis.  --Continue to follow up with ENT, cardiology, nephrology.   Pleural effusion. - Patient has a moderate right-sided pleural effusion, this is likely secondary to systolic congestive heart failure.  Currently the patient does not appear to be very symptomatic from this. --Discussed that if he develops respiratory failure can drain the fluid if needed, however the fluid would likely come back due to underlying heart and renal failure.   Pulmonary hypertension. -The patient currently has fairly good exercise tolerance and no evidence of desaturation on exercise at this time (grade 1 to grade 2 dyspnea).  Therefore I do not think he would get much or any benefit from treatment of pulmonary hypertension.  If his exercise tolerance decreases, could consider initiation of therapy.  The patient is currently on nitrates, therefore could not be started on revatio.   Return if symptoms worsen or fail to improve.    Date: 09/05/2017  MRN# [id number] Dennis [facility] 1928-06-01    Dennis Burns is a 84 y.o. old male seen in [place] for chief complaint of:    Chief Complaint  Patient presents with  . Consult    Referred by Dr. Pryor Ochoa for pleural effusion  . Shortness of Breath    with exertion; paralyzed left vocal cord.    HPI:   Dennis [place] is a pleasant 84 year old gentleman with history of peripheral arterial disease, coronary artery disease status post CABG x4 in 2003, chronic renal insufficiency with atrophic kidney, DVT of the right lower extremity in 2012.  He also has a paralyzed left vocal cord with a history of dysphagia. He was admitted to the hospital earlier this year from 04/30/2017 to 3/7.  At that time he was found to have acute renal failure, non-ST elevation  MI, acute CHF. He recently followed up with cardiology on 07/10/2017, he was noted that he has been taking Lasix 60 mg twice daily, and was recommended to take metolazone 2.5 mg twice weekly.  He is also noted to have chronic stage III CKD.  Recently he was seeing ENT in follow up for paralyzed left vocal cord. He was sent for a CT head and neck which showed a pleural effusion. He has been having a breathing issues, he couls do a bout a half a block. He notes that when the fluid goes down his breathing improves.  Wife notes that he is sleepy during the day, usually in the morning.  He has a circle and he used to be to walk 25 minutes until last September, he is now trying to increase his # of laps.   He recently completed cardiac rehab on 08/02/2017  Review of testing: **Desat walk 09/05/17 on RA at rest; 98% at RA and HR 63. Pt walked 720 feet sat 98% and HR 84; Minimal dyspnea.  **CT head neck 08/21/2017>> imaging personally reviewed small paratracheal and subcarinal lymphadenopathy.  Enlarged pulmonary artery suggestive of pulmonary arterial hypertension, small to moderate right free-flowing pleural effusion seen in the upper half of the lungs included in this CT head and neck. **Echocardiogram 05/01/2017>> EF is 35%, moderate to severe mitral regurgitation, reduced systolic function of the right ventricle, pulmonary artery systolic hypertension peak pressure 70 to 75 m of mercury. **Right heart catheterization 02/14/17>> mildly elevated filling pressures, moderate pulmonary hypertension and normal cardiac output. Pulmonary  wedge pressure was 16 mmHg, PA pressure was 55 over 13 mmHg and cardiac output was 5.84 with a cardiac index of 3.5. **Chest x-ray 05/06/2017>> images personally reviewed, there is mild interstitial edema in the bases, likely consistent with pulmonary edema.    PMHX:   Past Medical History:  Diagnosis Date  . Allergic rhinitis due to pollen    as child--better now  . Chronic  kidney disease, stage III (moderate) (HCC)   . Coronary artery disease   . DVT, lower extremity, recurrent ([place])   . Glaucoma    [facility]   . History of prostate cancer 2004  . Hyperlipidemia   . Hypertension   . Hypothyroidism   . Impaired fasting glucose   . Prostate cancer ([place]) 2004   Rad seed implants  . Spinal stenosis of lumbar region with radiculopathy    Surgical Hx:  Past Surgical History:  Procedure Laterality Date  . CAROTID ENDARTERECTOMY Right 09/2004   [place] GRAFT  2003   [place]   after fall  . INSERTION PROSTATE RADIATION SEED  2004   and RT  . POSTERIOR LUMBAR FUSION  2012, 2014  . RIGHT/LEFT HEART CATH AND CORONARY ANGIOGRAPHY N/A 02/14/2017   Procedure: RIGHT/LEFT HEART CATH AND CORONARY ANGIOGRAPHY;  Surgeon: Wellington [place], MD;  Location: [facility];  Service: Cardiovascular;  Laterality: N/A;   Family Hx:  Family History  Problem Relation Age of Onset  . Cancer Mother        colon (cause of death) and breast  . Glaucoma Mother   . Cancer Father        colon  . Stroke Maternal Grandfather   . Diabetes Neg Hx    Social Hx:   Social History   Tobacco Use  . Smoking status: Former Smoker    Packs/day: 0.25    Years: 18.00    Pack years: 4.50    Last attempt to quit: 1978    Years since quitting: 41.5  . Smokeless tobacco: Never Used  . Tobacco comment: Quit in 1978  Substance Use Topics  . Alcohol use: Yes  . Drug use: No   Medication:    Current Outpatient Medications:  .  Alpha-Lipoic Acid 200 MG CAPS, Take 1 capsule by mouth daily., Disp: , Rfl:  .  apixaban (ELIQUIS) 2.5 MG TABS tablet, Take 1 tablet (2.5 mg total) by mouth 2 (two) times daily., Disp: 180 tablet, Rfl: 3 .  atorvastatin (LIPITOR) 40 MG tablet, Take 40 mg by mouth daily., Disp: , Rfl:  .  CALCIUM CITRATE PO, Take 1 tablet by mouth daily. Takes 300mg  daily., Disp: , Rfl:  .  carvedilol  (COREG) 12.5 MG tablet, Take 1 tablet (12.5 mg total) by mouth 2 (two) times daily with a meal., Disp: 180 tablet, Rfl: 3 .  Cholecalciferol (VITAMIN D) 2000 units CAPS, Take 4,000 Units by mouth daily., Disp: , Rfl:  .  Coenzyme Q10 (CO Q10) 200 MG CAPS, Take 1 capsule by mouth daily., Disp: , Rfl:  .  CRANBERRY EXTRACT PO, Take 1 tablet by mouth daily. Takes 1500mg  daily, Disp: , Rfl:  .  ezetimibe (ZETIA) 10 MG tablet, Take 1 tablet (10 mg total) by mouth daily., Disp: 90 tablet, Rfl: 4 .  feeding supplement, ENSURE ENLIVE, (ENSURE ENLIVE) LIQD, Take 237 mLs by mouth 2 (two) times daily between meals., Disp: 237 mL, Rfl:  12 .  Ferrous Gluconate (IRON 27 PO), Take by mouth daily., Disp: , Rfl:  .  furosemide (LASIX) 40 MG tablet, Take 1.5 tablets (60 mg total) by mouth 2 (two) times daily., Disp: 135 tablet, Rfl: 3 .  hydrALAZINE (APRESOLINE) 10 MG tablet, Take 1 tablet (10 mg total) by mouth 3 (three) times daily., Disp: 270 tablet, Rfl: 3 .  isosorbide mononitrate (IMDUR) 60 MG 24 hr tablet, Take 1 tablet (60 mg total) by mouth daily., Disp: 90 tablet, Rfl: 3 .  latanoprost (XALATAN) 0.005 % ophthalmic solution, Place 1 drop into both eyes at bedtime. In morning, Disp: , Rfl:  .  levothyroxine (SYNTHROID, LEVOTHROID) 75 MCG tablet, Take 1 tablet (75 mcg total) by mouth daily., Disp: 90 tablet, Rfl: 3 .  metolazone (ZAROXOLYN) 2.5 MG tablet, Take 1 tablet (2.5 mg total) by mouth daily as needed., Disp: 90 tablet, Rfl: 3 .  Multiple Vitamin (MULTIVITAMIN) tablet, Take 1 tablet by mouth daily., Disp: , Rfl:  .  nitroGLYCERIN (NITROSTAT) 0.4 MG SL tablet, Place 1 tablet (0.4 mg total) under the tongue every 5 (five) minutes as needed for chest pain., Disp: , Rfl: 12 .  Omega 3-6-9 Fatty Acids (OMEGA-3-6-9 PO), Take 1 capsule by mouth 2 (two) times daily., Disp: , Rfl:  .  potassium chloride (K-DUR) 10 MEQ tablet, Take 1 tablet (10 mEq total) by mouth as directed. Take 3 times daily and 4 pills when  taking metolazone, Disp: 360 tablet, Rfl: 3 .  timolol (BETIMOL) 0.5 % ophthalmic solution, Place 1 drop into both eyes 2 (two) times daily., Disp: , Rfl:  .  traMADol (ULTRAM) 50 MG tablet, Take 2 tablets (100 mg total) by mouth 3 (three) times daily as needed., Disp: 180 tablet, Rfl: 2   Allergies:  Patient has no known allergies.  Review of Systems: Gen:  Denies  fever, sweats, chills HEENT: Denies blurred vision, double vision. bleeds, sore throat Cvc:  No dizziness, chest pain. Resp:   Denies cough or sputum production, shortness of breath Gi: Denies swallowing difficulty, stomach pain. Gu:  Denies bladder incontinence, burning urine Ext:   No Joint pain, stiffness. Skin: No skin rash,  hives  Endoc:  No polyuria, polydipsia. Psych: No depression, insomnia. Other:  All other systems were reviewed with the patient and were negative other that what is mentioned in the HPI.   Physical Examination:   VS: BP 110/60 (BP Location: Left Arm, Cuff Size: Normal)   Pulse 70   Resp 16   Ht 5' 3.3" (1.608 m)   Wt 130 lb (59 kg)   SpO2 97%   BMI 22.81 kg/m   General Appearance: No distress  Neuro:without focal findings,  speech normal,  HEENT: PERRLA, EOM intact.   Pulmonary: normal breath sounds, No wheezing.  CardiovascularNormal S1,S2.  No m/r/g.   Abdomen: Benign, Soft, non-tender. Renal:  No costovertebral tenderness  GU:  No performed at this time. Endoc: No evident thyromegaly, no signs of acromegaly. Skin:   warm, no rashes, no ecchymosis  Extremities: normal, no cyanosis, clubbing.  Other findings:    LABORATORY PANEL:   CBC No results for input(s): WBC, HGB, HCT, PLT in the last 168 hours. ------------------------------------------------------------------------------------------------------------------  Chemistries  No results for input(s): NA, K, CL, CO2, GLUCOSE, BUN, CREATININE, CALCIUM, MG, AST, ALT, ALKPHOS, BILITOT in the last 168 hours.  Invalid  input(s): GFRCGP ------------------------------------------------------------------------------------------------------------------  Cardiac Enzymes No results for input(s): TROPONINI in the last 168 hours. ------------------------------------------------------------  RADIOLOGY:  No results found.     Thank  you for the [facility] and for allowing [facility] to assist in the care of your patient. Our recommendations are noted above.  Please contact us if we can be of further service.   Marda Stalker, M.D., F.C.C.P.  Board Certified in Internal Medicine, Pulmonary Medicine, [place], and Sleep Medicine.  [facility] Office Number: [phone]   09/05/2017

## 2017-09-05 ENCOUNTER — Encounter: Payer: Self-pay | Admitting: Internal Medicine

## 2017-09-05 ENCOUNTER — Ambulatory Visit: Payer: Medicare PPO | Admitting: Internal Medicine

## 2017-09-05 VITALS — BP 110/60 | HR 70 | Resp 16 | Ht 63.3 in | Wt 130.0 lb

## 2017-09-05 DIAGNOSIS — I270 Primary pulmonary hypertension: Secondary | ICD-10-CM | POA: Diagnosis not present

## 2017-09-05 DIAGNOSIS — R0609 Other forms of dyspnea: Secondary | ICD-10-CM

## 2017-09-05 DIAGNOSIS — J90 Pleural effusion, not elsewhere classified: Secondary | ICD-10-CM | POA: Diagnosis not present

## 2017-09-05 DIAGNOSIS — R0600 Dyspnea, unspecified: Secondary | ICD-10-CM

## 2017-09-05 NOTE — Patient Instructions (Signed)
Continue to try to increase your activity.  Use the metalozone twice weekly and as needed.

## 2017-09-11 NOTE — Progress Notes (Signed)
Cardiology Office Note  Date:  09/13/2017   ID:  Dennis [facility], DOB [birth year], MRN [id number]  PCP:  Dennis Carbon, MD   Chief Complaint  Patient presents with  . Chronic SOB    HPI:  [name] is a pleasant 84 year old gentleman with history of  Chronic shortness of breath  mild aortic valve stenosis/moderate MR/moderate pulmonary hypertension,  PAD, s/p CEA on the right,  Duplex showed significant bilateral common iliac artery stenosis as well as severe left SFA stenosis. Hyperlipidemia,  hypertension,  coronary artery disease, CABG x 4 in 2003,  Chronic renal insufficiency,  atrophic kidney,  living off one kidney, seen by nephrology,Dr. Candiss [place] chronic back pain, sciatica, spinal stenosis, back surgery 2  prostate cancer, radioactive seeds  DVT in right leg noted before 2012 surgery, on  xarelto echocardiogram October 2017 showing mild to moderate aortic valve stenosis, moderate MR, moderately elevated right heart pressures, ejection fraction 55% or greater prior smoking history less than 10 years when he was younger  who presents for follow-up of his coronary disease and PAD chronic shortness of breath  Walking daily,overall feels well Still with chronic shortness of breath  Napping less, Waking early, more active Weight 124 to 130 Takes metolazone for weight 129  Renal function improving on review of labs, creatinine down 1.3,   Long discussion concerning previous carotid ultrasound 40-59% disease on the left with significant  left subclavian disease Good left radial artery pulse with no symptoms in the left arm  Faithful with his Lasix 60 twice a day  Completed cardiac rehab done 12 session Legs feel strong  EKG personally reviewed by myself on todays visit Shows normal sinus rhythm with rate 69 bpm nonspecific ST and T wave abnormality  Previous Long hospitalization for acute on chronic respiratory distress Hospital records reviewed with the patient in  detail On and off bipap, aggressive diuresis , severe pulm HTN, CRF Mod to severe MR,  mod TR, EF 35% milrinone during this admission  Moderate aortic stenosis with moderate aortic insufficiency D/c on lasix 40 BID  Cardiac cath 02/14/217 Severe underlying heavily calcified three-vessel coronary artery disease occluded LAD,  ostial right coronary artery and severe ostial left circumflex disease.   Patent LIMA to LAD and SVG to right coronary artery. The SVG to OM is possibly occluded, not visible on asending aortogram.  graft might be open given that there is some mild competitive flow in the distal left circumflex. -- left subclavian tortuosity and heavy calcifications throughout.  2.  Right heart catheterization showed only mildly elevated filling pressures, moderate pulmonary hypertension and normal cardiac output. Pulmonary wedge pressure was 16 mmHg, PA pressure was 55 over 13 mmHg and cardiac output was 5.84 with a cardiac index of 3.5.  3.  Left ventricular angiography was not performed due to chronic kidney disease.  --The native left circumflex is too high risk for PCI given involvement of left main, ostial location and heavy calcifications.  treat medically  --moderate pulmonary hypertension   Followed by Dr. Candiss [place] Previous creatinine 1.48 in October  Takes Lasix daily , sometimes twice a day After fluid hydration following cardiac catheterization creatinine down to 1.2, BUN 21 GFR up to 50  Seen in the emergency room 07/22/2016 for chest pain This occurred when he was rushing and had significant shortness of breath Last stress test September 2017, no significant ischemia on review of images by myself Blood pressure elevated in the emergency room Creatinine up to 1.66, BUN  53  continues to have stable but moderate intensity shortness of breath, worse on exertion.  Recent episode of chest discomfort on extreme exertion   chronic back pain, now with numbness down his  legs Sees pain management, taking tramadol, oxy BID prn  Other past medical history reviewed Previous noninvasive vascular evaluation which showed normal ABI on the right side and mildly reduced on the left at 0.71.  Duplex showed significant bilateral common iliac artery stenosis as well as severe left SFA stenosis.  Carotid ultrasound reviewed with him showing 60-79% left carotid disease 40-59% disease on the right  Lower extremity arterial Doppler showing at least moderate common iliac arterial disease, severe disease left mid SFA   back surgery 2012 and 2014  PMH:   has a past medical history of Allergic rhinitis due to pollen, Chronic kidney disease, stage III (moderate) ([place]), Coronary artery disease, DVT, lower extremity, recurrent ([place]), Glaucoma, History of prostate cancer (2004), Hyperlipidemia, Hypertension, Hypothyroidism, Impaired fasting glucose, Prostate cancer ([place]) (2004), and Spinal stenosis of lumbar region with radiculopathy.  PSH:    Past Surgical History:  Procedure Laterality Date  . CAROTID ENDARTERECTOMY Right 09/2004   [place] GRAFT  2003   [place]   after fall  . INSERTION PROSTATE RADIATION SEED  2004   and RT  . POSTERIOR LUMBAR FUSION  2012, 2014  . RIGHT/LEFT HEART CATH AND CORONARY ANGIOGRAPHY N/A 02/14/2017   Procedure: RIGHT/LEFT HEART CATH AND CORONARY ANGIOGRAPHY;  Surgeon: Wellington Hampshire, MD;  Location: [facility];  Service: Cardiovascular;  Laterality: N/A;    Current Outpatient Medications  Medication Sig Dispense Refill  . Alpha-Lipoic Acid 200 MG CAPS Take 1 capsule by mouth daily.    Marland Kitchen apixaban (ELIQUIS) 2.5 MG TABS tablet Take 1 tablet (2.5 mg total) by mouth 2 (two) times daily. 180 tablet 3  . atorvastatin (LIPITOR) 40 MG tablet Take 40 mg by mouth daily.    Marland Kitchen CALCIUM CITRATE PO Take 1 tablet by mouth daily. Takes 300mg  daily.    . carvedilol (COREG) 12.5 MG  tablet Take 1 tablet (12.5 mg total) by mouth 2 (two) times daily with a meal. 180 tablet 3  . Cholecalciferol (VITAMIN D) 2000 units CAPS Take 4,000 Units by mouth daily.    . Coenzyme Q10 (CO Q10) 200 MG CAPS Take 1 capsule by mouth daily.    Marland Kitchen CRANBERRY EXTRACT PO Take 1 tablet by mouth daily. Takes 1500mg  daily    . ezetimibe (ZETIA) 10 MG tablet Take 1 tablet (10 mg total) by mouth daily. 90 tablet 4  . feeding supplement, ENSURE ENLIVE, (ENSURE ENLIVE) LIQD Take 237 mLs by mouth 2 (two) times daily between meals. 237 mL 12  . Ferrous Gluconate (IRON 27 PO) Take by mouth daily.    . furosemide (LASIX) 40 MG tablet Take 1.5 tablets (60 mg total) by mouth 2 (two) times daily. 135 tablet 3  . hydrALAZINE (APRESOLINE) 10 MG tablet Take 1 tablet (10 mg total) by mouth 3 (three) times daily. 270 tablet 3  . isosorbide mononitrate (IMDUR) 60 MG 24 hr tablet Take 1 tablet (60 mg total) by mouth daily. 90 tablet 3  . latanoprost (XALATAN) 0.005 % ophthalmic solution Place 1 drop into both eyes at bedtime. In morning    . levothyroxine (SYNTHROID, LEVOTHROID) 75 MCG tablet Take 1 tablet (75 mcg total) by mouth daily. 90 tablet 3  .  metolazone (ZAROXOLYN) 2.5 MG tablet Take 1 tablet (2.5 mg total) by mouth daily as needed. 90 tablet 3  . Multiple Vitamin (MULTIVITAMIN) tablet Take 1 tablet by mouth daily.    . nitroGLYCERIN (NITROSTAT) 0.4 MG SL tablet Place 1 tablet (0.4 mg total) under the tongue every 5 (five) minutes as needed for chest pain.  12  . Omega 3-6-9 Fatty Acids (OMEGA-3-6-9 PO) Take 1 capsule by mouth 2 (two) times daily.    . potassium chloride (K-DUR) 10 MEQ tablet Take 1 tablet (10 mEq total) by mouth as directed. Take 3 times daily and 4 pills when taking metolazone 360 tablet 3  . timolol (BETIMOL) 0.5 % ophthalmic solution Place 1 drop into both eyes 2 (two) times daily.    . traMADol (ULTRAM) 50 MG tablet Take 2 tablets (100 mg total) by mouth 3 (three) times daily as needed. 180  tablet 2   No current facility-administered medications for this visit.      Allergies:   Patient has no known allergies.   Social History:  The patient  reports that he quit smoking about 41 years ago. He has a 4.50 pack-year smoking history. He has never used smokeless tobacco. He reports that he drinks alcohol. He reports that he does not use drugs.   Family History:   family history includes Cancer in his father and mother; Glaucoma in his mother; Stroke in his maternal grandfather.    Review of Systems: Review of Systems  Respiratory: Positive for shortness of breath.   Cardiovascular: Negative.   Gastrointestinal: Negative.   Musculoskeletal: Negative.   Neurological: Negative.   Psychiatric/Behavioral: Negative.   All other systems reviewed and are negative.    PHYSICAL EXAM: VS:  BP 130/60 (BP Location: Left Arm, Patient Position: Sitting, Cuff Size: Normal)   Pulse 69   Ht 5\' 3"  (1.6 m)   Wt 129 lb 4 oz (58.6 kg)   BMI 22.90 kg/m  , BMI Body mass index is 22.9 kg/m. No significant change in exam Constitutional:  oriented to person, place, and time. No distress.  HENT:  Head: Normocephalic and atraumatic.  Eyes:  no discharge. No scleral icterus.  Neck: Normal range of motion. Neck supple.  JVD unable to estimate Cardiovascular: Normal rate, regular rhythm, normal heart sounds and intact distal pulses. Exam reveals no gallop and no friction rub. No edema No murmur heard. Pulmonary/Chest: oderately decreased breath sounds throughout,  No stridor. No respiratory distress.  no wheezes.  scattered rales.  no tenderness.  Abdominal: Soft.  no distension.  no tenderness.  Musculoskeletal: Normal range of motion.  no  tenderness or deformity.  Neurological:  normal muscle tone. Coordination normal. No atrophy Skin: Skin is warm and dry. No rash noted. not diaphoretic.  Psychiatric:  normal mood and affect. behavior is normal. Thought content normal.   Recent  Labs: 04/30/2017: TSH 1.819 05/07/2017: B Natriuretic Peptide 3,671.0; Magnesium 2.1 06/26/2017: BUN 59; Creatinine, Ser 1.28; Hemoglobin 12.0; Platelets 222; Potassium 3.4; Sodium 138    Lipid Panel Lab Results  Component Value Date   CHOL 114 05/01/2017   HDL 34 (L) 05/01/2017   LDLCALC 58 05/01/2017   TRIG 109 05/01/2017      Wt Readings from Last 3 Encounters:  09/13/17 129 lb 4 oz (58.6 kg)  09/05/17 130 lb (59 kg)  07/25/17 130 lb 4.8 oz (59.1 kg)       ASSESSMENT AND PLAN:  Coronary artery disease involving native coronary artery of  native heart without angina pectoris  Previous catheterization results discussed with him Medical management at this time including ostial left circumflex lesion denies any anginal symptoms, walking 10 laps around the track No medication changes made  Pulmonary hypertension Goal weight 125 pounds at home or less He takes metolazone for weight of 129 or higher Recommended he stay on Lasix 60 twice a day  The previously recommended metolazone 2.5 twice a week Stable renal function  Pure hypercholesterolemia - Plan: EKG 66-YQIH, Basic Metabolic Panel (BMET) Tolerating Zetia with Lipitor Goal LDL less than 70 stable  Essential hypertension -  blood pressure stable No changes made, stable  SOB (shortness of breath) - Plan: EKG [id number], Basic Metabolic Panel (BMET) Moderate, stable Much better on today's visit feeling stronger exercising more Stressed importance of keeping him on the "dry side' for symptom relief  Recurrent deep vein thrombosis (DVT) of right lower extremity (HCC) on anticoagulation , Eliquis 2.5 twice daily given his renal dysfunction  No recurrence. No changes to his medications  Spinal stenosis of lumbar region with radiculopathy Previously discussed lower extremity neuropathy, Stable  Chronic kidney disease, stage III (moderate) Followed by Dr. Candiss [place]. Creatinine 1.3 Diuretics as above  PAD (peripheral  artery disease) (HCC) Stable at least moderate bilateral disease Previously seen by Dr. Fletcher Anon No left arm symptoms concerning for claudication Carotid ultrasound in October 2019   Total encounter time more than 25 minutes  Greater than 50% was spent in counseling and coordination of care with the patient  Disposition:   F/U  12 months   No orders of the defined types were placed in this encounter.    Signed, Esmond Plants, M.D., Ph.D. 09/13/2017  [place], [place]

## 2017-09-13 ENCOUNTER — Ambulatory Visit: Payer: Medicare PPO | Admitting: Cardiovascular Disease

## 2017-09-13 ENCOUNTER — Encounter: Payer: Self-pay | Admitting: Cardiovascular Disease

## 2017-09-13 VITALS — BP 130/60 | HR 69 | Ht 63.0 in | Wt 129.2 lb

## 2017-09-13 DIAGNOSIS — J9601 Acute respiratory failure with hypoxia: Secondary | ICD-10-CM | POA: Diagnosis not present

## 2017-09-13 DIAGNOSIS — I82401 Acute embolism and thrombosis of unspecified deep veins of right lower extremity: Secondary | ICD-10-CM | POA: Diagnosis not present

## 2017-09-13 DIAGNOSIS — E785 Hyperlipidemia, unspecified: Secondary | ICD-10-CM

## 2017-09-13 DIAGNOSIS — I739 Peripheral vascular disease, unspecified: Secondary | ICD-10-CM

## 2017-09-13 DIAGNOSIS — I5042 Chronic combined systolic (congestive) and diastolic (congestive) heart failure: Secondary | ICD-10-CM | POA: Diagnosis not present

## 2017-09-13 DIAGNOSIS — I10 Essential (primary) hypertension: Secondary | ICD-10-CM | POA: Diagnosis not present

## 2017-09-13 DIAGNOSIS — N183 Chronic kidney disease, stage 3 unspecified: Secondary | ICD-10-CM

## 2017-09-13 DIAGNOSIS — I272 Pulmonary hypertension, unspecified: Secondary | ICD-10-CM

## 2017-09-13 DIAGNOSIS — I25118 Atherosclerotic heart disease of native coronary artery with other forms of angina pectoris: Secondary | ICD-10-CM

## 2017-09-13 NOTE — Patient Instructions (Signed)

## 2017-09-14 NOTE — Addendum Note (Signed)
Addended by: Anselm Pancoast on: 09/14/2017 10:44 AM   Modules accepted: Orders

## 2017-09-17 ENCOUNTER — Ambulatory Visit
Admission: RE | Admit: 2017-09-17 | Discharge: 2017-09-17 | Disposition: A | Discharge status: Home / Self Care | Payer: Medicare PPO | Source: Ambulatory Visit | Attending: Otolaryngology | Admitting: Otolaryngology

## 2017-09-17 DIAGNOSIS — R1310 Dysphagia, unspecified: Secondary | ICD-10-CM | POA: Diagnosis present

## 2017-09-17 DIAGNOSIS — R1313 Dysphagia, pharyngeal phase: Secondary | ICD-10-CM | POA: Insufficient documentation

## 2017-09-17 DIAGNOSIS — J3800 Paralysis of vocal cords and larynx, unspecified: Secondary | ICD-10-CM | POA: Insufficient documentation

## 2017-09-17 NOTE — Therapy (Signed)
[facility] [place], [place], [address] Phone: [phone]   Fax:     Modified Barium Swallow  Patient Details  Name: Dennis Burns MRN: [id number] Date of Birth: [birth year] No data recorded  Encounter Date: 09/17/2017  End of Session - 09/17/17 1344    Visit Number  1    Number of Visits  1    Date for SLP Re-Evaluation  09/17/17    SLP Start Time  33    SLP Stop Time   1330    SLP Time Calculation (min)  60 min    Activity Tolerance  Patient tolerated treatment well       Past Medical History:  Diagnosis Date  . Allergic rhinitis due to pollen    as child--better now  . Chronic kidney disease, stage III (moderate) (HCC)   . Coronary artery disease   . DVT, lower extremity, recurrent ([place])   . Glaucoma    [facility]   . History of prostate cancer 2004  . Hyperlipidemia   . Hypertension   . Hypothyroidism   . Impaired fasting glucose   . Prostate cancer ([place]) 2004   Rad seed implants  . Spinal stenosis of lumbar region with radiculopathy     Past Surgical History:  Procedure Laterality Date  . CAROTID ENDARTERECTOMY Right 09/2004   [place] GRAFT  2003   [place]   after fall  . INSERTION PROSTATE RADIATION SEED  2004   and RT  . POSTERIOR LUMBAR FUSION  2012, 2014  . RIGHT/LEFT HEART CATH AND CORONARY ANGIOGRAPHY N/A 02/14/2017   Procedure: RIGHT/LEFT HEART CATH AND CORONARY ANGIOGRAPHY;  Surgeon: Wellington Hampshire, MD;  Location: [facility];  Service: Cardiovascular;  Laterality: N/A;    There were no vitals filed for this visit.   Subjective:   Patient behavior: (alertness, ability to follow instructions, etc.): The patient is able to verbalize his swallowing history and follow directions  Chief complaint: dry mouth, difficulty with dry foods, cannot clear throat, difficulty initiating swallow at  times   Objective:  Radiological Procedure: A videoflouroscopic evaluation of oral-preparatory, reflex initiation, and pharyngeal phases of the swallow was performed; as well as a screening of the upper esophageal phase.  I. POSTURE: Upright in MBS chair  II. VIEW: Lateral  III. COMPENSATORY STRATEGIES: Liquid wash aids pharyngeal clearance  IV. BOLUSES ADMINISTERED:   Thin Liquid: 1 small, 3 rapid consecutive   Nectar-thick Liquid: 4 rapid consecutive   Honey-thick Liquid: DNT   Puree: 2 teaspoon presentations   Mechanical Soft: 1/4 graham cracker in applesauce  V. RESULTS OF EVALUATION: A. ORAL PREPARATORY PHASE: (The lips, tongue, and velum are observed for strength and coordination)       **Overall Severity Rating: Within normal limits  B. SWALLOW INITIATION/REFLEX: (The reflex is normal if "triggered" by the time the bolus reached the base of the tongue)  **Overall Severity Rating: Within normal limits  C. PHARYNGEAL PHASE: (Pharyngeal function is normal if the bolus shows rapid, smooth, and continuous transit through the pharynx and there is no pharyngeal residue after the swallow)  **Overall Severity Rating: Mild; decreased tongue base retraction, reduced anterior hyoid movement, incomplete epiglottic inversion, and mild pharyngeal residue post swallow (worse with solids vs. liquids).    D. LARYNGEAL PENETRATION: (Material entering into the laryngeal inlet/vestibule but not aspirated) X1- transient- with thin  liquid  E. ASPIRATION: None  F. ESOPHAGEAL PHASE: (Screening of the upper esophagus) large cervical vertebrae that appear to narrow the cervical esophagus but do not impede bolus flow through the upper esophagus.    ASSESSMENT: This 84 year old man; with left vocal fold paralysis  and complaint of difficulty swallowing solids; is presenting with mild pharyngeal dysphagia characterized by reduced tongue base retraction, reduced anterior hyoid movement, incomplete  epiglottic inversion, and mild pharyngeal residue post swallow (worse with solids vs. liquids).  Oral control of the bolus including oral hold, rotary mastication, and anterior to posterior transfer is within normal limits.  Timing of pharyngeal swallow initiation is within functional limits.  There was one episode of transient laryngeal penetration (within normal limits for age) and no observed tracheal aspiration.  The patient is not at significant risk for prandial aspiration.  There are large cervical vertebrae that appear to narrow the cervical esophagus but do not impede bolus flow through the upper esophagus.  The patient was counseled that his swallowing was safe and that he should soften/moisten foods as needed for more comfortable swallowing.   PLAN/RECOMMENDATIONS:   A. Diet: Regular (soften and moisten as needed)   B. Swallowing Precautions: small bites/sips, alternate liquids and solids   C. Recommended consultation to: follow up with MDs as recommended   D. Therapy recommendations: speech therapy for swallowing is not indicated   E. Results and recommendations were discussed with the patient immediately following the study and the final report routed to the referring MD.   Pharyngeal dysphagia - Plan: DG OP Swallowing Func-Medicare/Speech Path, DG OP Swallowing Func-Medicare/Speech Path  Paralyzed vocal cords - Plan: DG OP Swallowing Func-Medicare/Speech Path, DG OP Swallowing Func-Medicare/Speech Path        Problem List Patient Active Problem List   Diagnosis Date Noted  . Hoarseness 07/25/2017  . Mood disorder ([place]) 07/25/2017  . Chronic combined systolic and diastolic heart failure ([place])   . Effort angina ([place]) 02/14/2017  . Positive cardiac stress test 02/04/2017  . Paronychia of finger 06/13/2016  . Preventative health care 05/03/2016  . Pulmonary hypertension ([place]) 05/03/2016  . PAD (peripheral artery disease) ([place]) 04/18/2016  . Trigger finger 03/08/2016  .  DOE (dyspnea on exertion) 11/02/2015  . Spinal stenosis of lumbar region with radiculopathy   . Coronary artery disease   . History of prostate cancer   . Hyperlipidemia   . Hypertension   . Chronic kidney disease, stage III (moderate) (HCC)   . DVT, lower extremity, recurrent ([place])   . Glaucoma   . Hypothyroidism   . Impaired fasting glucose    Leroy Sea, MS/CCC- SLP  Lou Miner 09/17/2017, 1:46 PM  [facility] [place], [place], [address] Phone: [phone]   Fax:     Name: Dennis Burns MRN: [id number] Date of Birth: [birth year]

## 2017-10-01 DIAGNOSIS — R490 Dysphonia: Secondary | ICD-10-CM | POA: Diagnosis not present

## 2017-10-01 DIAGNOSIS — J3801 Paralysis of vocal cords and larynx, unilateral: Secondary | ICD-10-CM | POA: Diagnosis not present

## 2017-10-01 DIAGNOSIS — J3800 Paralysis of vocal cords and larynx, unspecified: Secondary | ICD-10-CM | POA: Diagnosis not present

## 2017-10-02 DIAGNOSIS — H401133 Primary open-angle glaucoma, bilateral, severe stage: Secondary | ICD-10-CM | POA: Diagnosis not present

## 2017-10-07 ENCOUNTER — Encounter: Payer: Self-pay | Admitting: Internal Medicine

## 2017-10-15 ENCOUNTER — Telehealth: Payer: Self-pay

## 2017-10-15 NOTE — Telephone Encounter (Signed)
Last 2 OV notes and labs faxed to number provided

## 2017-10-15 NOTE — Telephone Encounter (Signed)
Copied from [facility] [phone]. Topic: General - Other >> Oct 15, 2017  9:32 AM Carolyn Stare wrote:  Dr Dina Rich  a geriatric and  specical care dentist office call to ask for H&P and most recent labs    Fax [phone]

## 2017-10-16 ENCOUNTER — Ambulatory Visit: Payer: Self-pay | Admitting: Internal Medicine

## 2017-10-16 NOTE — Telephone Encounter (Signed)
Pt noted golf ball or tennis ball sized right groin swelling 1 week ago.  Pt stated size varies, "sometimes larger and sometimes smaller." Pt stated the swelling feels like a strain and stated he has to be careful when sitting down because the growth feels like it is pulling something.  Pt denies pain but stated there is strain across lower abdomen. Pt stated he had an episode of constipation but after taking laxative he was able to have a bowel movement.  Care advice given per protocol and pt verbalized understanding. Pt given appointment 10/18/17 at 3:30 pm.   Reason for Disposition . Can't reduce the hernia (NO pain, local tenderness, or vomiting)  Answer Assessment - Initial Assessment Questions 1. ONSET:  "When did this first appear?"     1 week ago 2. APPEARANCE: "What does it look like?"     "Big swelling" 3. SIZE: "How big is it?" (inches, cm or compare to coins, fruit)    golfball or tennis ball 4. LOCATION: "Where exactly is the hernia located?"     Above right groin 5. PATTERN: "Does the swelling come and go, or has it been constant since it started?"     Size varies sometimes is larger some times is it almost normal 6. PAIN: "Is there any pain?" If so, ask: "How bad is it?"  (Scale 1-10; or mild, moderate, severe)     Feels like a strain has to be careful when sitting down feels like its pulling on something 7. DIAGNOSIS: "Have you been seen by a doctor for this?" "Did the doctor diagnose you as having a hernia?"     no 8. OTHER SYMPTOMS: "Do you have any other symptoms?" (e.g., fever, abdominal pain, vomiting)    Strain across lower abdomen 9. PREGNANCY: "Is there any chance you are pregnant?" "When was your last menstrual period?"     n/a  Protocols used: HERNIA-A-AH

## 2017-10-18 ENCOUNTER — Encounter: Payer: Self-pay | Admitting: Internal Medicine

## 2017-10-18 ENCOUNTER — Ambulatory Visit: Payer: Medicare PPO | Admitting: Internal Medicine

## 2017-10-18 VITALS — BP 132/72 | HR 67 | Temp 98.2°F | Ht 63.0 in | Wt 127.0 lb

## 2017-10-18 DIAGNOSIS — K4090 Unilateral inguinal hernia, without obstruction or gangrene, not specified as recurrent: Secondary | ICD-10-CM | POA: Insufficient documentation

## 2017-10-18 NOTE — Assessment & Plan Note (Signed)
Moderate sized Easily reduced Recommended no action given recent history and low risk for strangulation Discussed not straining with bowels Can try to find a truss

## 2017-10-18 NOTE — Patient Instructions (Signed)
I would recommend miralax 1 capful daily with water &/or 2 senna-s daily to keep your bowels regular without having to push.

## 2017-10-18 NOTE — Progress Notes (Signed)
Subjective:    Patient ID: Dennis Burns, male    DOB: 19-Jul-1928, 84 y.o.   MRN: [id number]  HPI Here due to lump in right groin Noticed it in the shower about a week ago Fairly big--but seems smaller since then. Goes up and down Slight numbness chronically since past back surgery  Slight feeling of "strain" there Is careful when moving--like getting in car Not really painful  Current Outpatient Medications on File Prior to Visit  Medication Sig Dispense Refill  . Alpha-Lipoic Acid 200 MG CAPS Take 1 capsule by mouth daily.    Marland Kitchen apixaban (ELIQUIS) 2.5 MG TABS tablet Take 1 tablet (2.5 mg total) by mouth 2 (two) times daily. 180 tablet 3  . atorvastatin (LIPITOR) 40 MG tablet Take 40 mg by mouth daily.    Marland Kitchen CALCIUM CITRATE PO Take 1 tablet by mouth daily. Takes 300mg  daily.    . carvedilol (COREG) 12.5 MG tablet Take 1 tablet (12.5 mg total) by mouth 2 (two) times daily with a meal. 180 tablet 3  . Cholecalciferol (VITAMIN D) 2000 units CAPS Take 4,000 Units by mouth daily.    . Coenzyme Q10 (CO Q10) 200 MG CAPS Take 1 capsule by mouth daily.    Marland Kitchen CRANBERRY EXTRACT PO Take 1 tablet by mouth daily. Takes 1500mg  daily    . ezetimibe (ZETIA) 10 MG tablet Take 1 tablet (10 mg total) by mouth daily. 90 tablet 4  . feeding supplement, ENSURE ENLIVE, (ENSURE ENLIVE) LIQD Take 237 mLs by mouth 2 (two) times daily between meals. 237 mL 12  . Ferrous Gluconate (IRON 27 PO) Take by mouth daily.    . furosemide (LASIX) 40 MG tablet Take 1.5 tablets (60 mg total) by mouth 2 (two) times daily. 135 tablet 3  . hydrALAZINE (APRESOLINE) 10 MG tablet Take 1 tablet (10 mg total) by mouth 3 (three) times daily. 270 tablet 3  . isosorbide mononitrate (IMDUR) 60 MG 24 hr tablet Take 1 tablet (60 mg total) by mouth daily. 90 tablet 3  . latanoprost (XALATAN) 0.005 % ophthalmic solution Place 1 drop into both eyes at bedtime. In morning    . levothyroxine (SYNTHROID, LEVOTHROID) 75 MCG tablet Take 1 tablet  (75 mcg total) by mouth daily. 90 tablet 3  . metolazone (ZAROXOLYN) 2.5 MG tablet Take 1 tablet (2.5 mg total) by mouth daily as needed. 90 tablet 3  . Multiple Vitamin (MULTIVITAMIN) tablet Take 1 tablet by mouth daily.    . nitroGLYCERIN (NITROSTAT) 0.4 MG SL tablet Place 1 tablet (0.4 mg total) under the tongue every 5 (five) minutes as needed for chest pain.  12  . Omega 3-6-9 Fatty Acids (OMEGA-3-6-9 PO) Take 1 capsule by mouth 2 (two) times daily.    . potassium chloride (K-DUR) 10 MEQ tablet Take 1 tablet (10 mEq total) by mouth as directed. Take 3 times daily and 4 pills when taking metolazone 360 tablet 3  . timolol (BETIMOL) 0.5 % ophthalmic solution Place 1 drop into both eyes 2 (two) times daily.    . traMADol (ULTRAM) 50 MG tablet Take 2 tablets (100 mg total) by mouth 3 (three) times daily as needed. 180 tablet 2   No current facility-administered medications on file prior to visit.     No Known Allergies  Past Medical History:  Diagnosis Date  . Allergic rhinitis due to pollen    as child--better now  . Chronic kidney disease, stage III (moderate) (HCC)   . Coronary artery  disease   . DVT, lower extremity, recurrent ([place])   . Glaucoma    [facility]   . History of prostate cancer 2004  . Hyperlipidemia   . Hypertension   . Hypothyroidism   . Impaired fasting glucose   . Prostate cancer ([place]) 2004   Rad seed implants  . Spinal stenosis of lumbar region with radiculopathy     Past Surgical History:  Procedure Laterality Date  . CAROTID ENDARTERECTOMY Right 09/2004   [place] GRAFT  2003   [place]   after fall  . INSERTION PROSTATE RADIATION SEED  2004   and RT  . POSTERIOR LUMBAR FUSION  2012, 2014  . RIGHT/LEFT HEART CATH AND CORONARY ANGIOGRAPHY N/A 02/14/2017   Procedure: RIGHT/LEFT HEART CATH AND CORONARY ANGIOGRAPHY;  Surgeon: Wellington Hampshire, MD;  Location: [facility];  Service:  Cardiovascular;  Laterality: N/A;    Family History  Problem Relation Age of Onset  . Cancer Mother        colon (cause of death) and breast  . Glaucoma Mother   . Cancer Father        colon  . Stroke Maternal Grandfather   . Diabetes Neg Hx     Social History   Socioeconomic History  . Marital status: Married    Spouse name: Not on file  . Number of children: 5  . Years of education: Not on file  . Highest education level: Not on file  Occupational History  . 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 Financial resource strain: Not on file  . Food insecurity:    Worry: Not on file    Inability: Not on file  . Transportation needs:    Medical: Not on file    Non-medical: Not on file  Tobacco Use  . Smoking status: Former Smoker    Packs/day: 0.25    Years: 18.00    Pack years: 4.50    Last attempt to quit: 1978    Years since quitting: 41.6  . Smokeless tobacco: Never Used  . Tobacco comment: Quit in 1978  Substance and Sexual Activity  . Alcohol use: Yes  . Drug use: No  . Sexual activity: Not on file  Lifestyle  . Physical activity:    Days per week: Not on file    Minutes per session: Not on file  . Stress: Not on file  Relationships  . Social connections:    Talks on phone: Not on file    Gets together: Not on file    Attends religious service: Not on file    Active member of club or organization: Not on file    Attends meetings of clubs or organizations: Not on file    Relationship status: Not on file  . Intimate partner violence:    Fear of current or ex partner: Not on file    Emotionally abused: Not on file    Physically abused: Not on file    Forced sexual activity: Not on file  Other Topics Concern  . Not on file  Social History Narrative   2nd marriage--- 1980   5 children--2 step children   12 grandchildren   21 great grandchildren      Has living will   Wife is health care POA   Would  accept resuscitation   Not sure about tube feeds  Review of Systems  Walking 20 minutes most days No injury Bowels are regular---few episodes of constipation (uses dulcolax). Last time straining was 1-2 weeks ago Voiding fine    Objective:   Physical Exam  GI: Soft. He exhibits no distension. There is no tenderness. There is no rebound and no guarding.  Reducible moderate sized right inguinal hernia--not into groin (reduced while standing)   Genitourinary:  Genitourinary Comments: No scrotal mass Testes normal           Assessment & Plan:

## 2017-10-26 ENCOUNTER — Ambulatory Visit: Payer: Medicare PPO | Admitting: Internal Medicine

## 2017-11-07 ENCOUNTER — Other Ambulatory Visit: Payer: Self-pay | Admitting: *Deleted

## 2017-11-07 DIAGNOSIS — M65322 Trigger finger, left index finger: Secondary | ICD-10-CM | POA: Diagnosis not present

## 2017-11-07 MED ORDER — FUROSEMIDE 40 MG PO TABS: 60.0000 mg | ORAL_TABLET | Freq: Two times a day (BID) | ORAL | 3 refills | Status: DC

## 2017-11-12 ENCOUNTER — Ambulatory Visit (INDEPENDENT_AMBULATORY_CARE_PROVIDER_SITE_OTHER): Payer: Medicare PPO | Admitting: Internal Medicine

## 2017-11-12 ENCOUNTER — Encounter: Payer: Self-pay | Admitting: Internal Medicine

## 2017-11-12 VITALS — BP 122/84 | HR 66 | Temp 97.8°F | Ht 62.75 in | Wt 128.0 lb

## 2017-11-12 DIAGNOSIS — N183 Chronic kidney disease, stage 3 unspecified: Secondary | ICD-10-CM

## 2017-11-12 DIAGNOSIS — I5042 Chronic combined systolic (congestive) and diastolic (congestive) heart failure: Secondary | ICD-10-CM

## 2017-11-12 DIAGNOSIS — E039 Hypothyroidism, unspecified: Secondary | ICD-10-CM

## 2017-11-12 DIAGNOSIS — R7303 Prediabetes: Secondary | ICD-10-CM

## 2017-11-12 DIAGNOSIS — Z0000 Encounter for general adult medical examination without abnormal findings: Secondary | ICD-10-CM

## 2017-11-12 DIAGNOSIS — I82401 Acute embolism and thrombosis of unspecified deep veins of right lower extremity: Secondary | ICD-10-CM | POA: Diagnosis not present

## 2017-11-12 DIAGNOSIS — M48061 Spinal stenosis, lumbar region without neurogenic claudication: Secondary | ICD-10-CM

## 2017-11-12 DIAGNOSIS — M5416 Radiculopathy, lumbar region: Secondary | ICD-10-CM

## 2017-11-12 DIAGNOSIS — Z7189 Other specified counseling: Secondary | ICD-10-CM | POA: Insufficient documentation

## 2017-11-12 DIAGNOSIS — Z23 Encounter for immunization: Secondary | ICD-10-CM

## 2017-11-12 MED ORDER — METOLAZONE 2.5 MG PO TABS: 2.5000 mg | ORAL_TABLET | Freq: Every day | ORAL | 0 refills | Status: DC | PRN

## 2017-11-12 NOTE — Progress Notes (Signed)
Subjective:    Patient ID: Dennis Burns, male    DOB: 04-17-1928, 84 y.o.   MRN: [id number]  HPI Here for Medicare wellness visit and follow up of chronic health conditions Reviewed form and advanced directives Reviewed other doctors No tobacco or alcohol Tries to walk and do exercise Has hearing aides Vision is okay No falls No depression or anhedonia Independent with instrumental ADLs--with wife No sig memory issues  Still feels tired at times Up at night ---lips get dry and also to void Gets up early for some meds---then back to sleep for 2 hours (till 8) Takes a while to get his energy going Still tires easily Breathing seems better Wonders about stopping the metolazone Daily weights in low 120's--highest 125#  No chest pain  No palpitations  No dizziness or syncope No SOB Done with cardiac rehab No edema  Reviewed labs CKD 3 has been stable  Mood seems to be good now Not anxious now Back to enjoying things Still rues what he can't do now  Current Outpatient Medications on File Prior to Visit  Medication Sig Dispense Refill  . Alpha-Lipoic Acid 200 MG CAPS Take 1 capsule by mouth daily.    Marland Kitchen apixaban (ELIQUIS) 2.5 MG TABS tablet Take 1 tablet (2.5 mg total) by mouth 2 (two) times daily. 180 tablet 3  . atorvastatin (LIPITOR) 40 MG tablet Take 40 mg by mouth daily.    Marland Kitchen CALCIUM CITRATE PO Take 1 tablet by mouth daily. Takes 300mg  daily.    . carvedilol (COREG) 12.5 MG tablet Take 1 tablet (12.5 mg total) by mouth 2 (two) times daily with a meal. 180 tablet 3  . Cholecalciferol (VITAMIN D) 2000 units CAPS Take 4,000 Units by mouth daily.    . Coenzyme Q10 (CO Q10) 200 MG CAPS Take 1 capsule by mouth daily.    Marland Kitchen CRANBERRY EXTRACT PO Take 1 tablet by mouth daily. Takes 1500mg  daily    . ezetimibe (ZETIA) 10 MG tablet Take 1 tablet (10 mg total) by mouth daily. 90 tablet 4  . feeding supplement, ENSURE ENLIVE, (ENSURE ENLIVE) LIQD Take 237 mLs by mouth 2 (two)  times daily between meals. 237 mL 12  . Ferrous Gluconate (IRON 27 PO) Take by mouth daily.    . furosemide (LASIX) 40 MG tablet Take 1.5 tablets (60 mg total) by mouth 2 (two) times daily. 270 tablet 3  . hydrALAZINE (APRESOLINE) 10 MG tablet Take 1 tablet (10 mg total) by mouth 3 (three) times daily. 270 tablet 3  . isosorbide mononitrate (IMDUR) 60 MG 24 hr tablet Take 1 tablet (60 mg total) by mouth daily. 90 tablet 3  . latanoprost (XALATAN) 0.005 % ophthalmic solution Place 1 drop into both eyes at bedtime. In morning    . levothyroxine (SYNTHROID, LEVOTHROID) 75 MCG tablet Take 1 tablet (75 mcg total) by mouth daily. 90 tablet 3  . Multiple Vitamin (MULTIVITAMIN) tablet Take 1 tablet by mouth daily.    . nitroGLYCERIN (NITROSTAT) 0.4 MG SL tablet Place 1 tablet (0.4 mg total) under the tongue every 5 (five) minutes as needed for chest pain.  12  . Omega 3-6-9 Fatty Acids (OMEGA-3-6-9 PO) Take 1 capsule by mouth 2 (two) times daily.    . potassium chloride (K-DUR) 10 MEQ tablet Take 1 tablet (10 mEq total) by mouth as directed. Take 3 times daily and 4 pills when taking metolazone 360 tablet 3  . timolol (BETIMOL) 0.5 % ophthalmic solution Place 1 drop  into both eyes 2 (two) times daily.    . traMADol (ULTRAM) 50 MG tablet Take 2 tablets (100 mg total) by mouth 3 (three) times daily as needed. 180 tablet 2   No current facility-administered medications on file prior to visit.     No Known Allergies  Past Medical History:  Diagnosis Date  . Allergic rhinitis due to pollen    as child--better now  . Chronic kidney disease, stage III (moderate) (HCC)   . Coronary artery disease   . DVT, lower extremity, recurrent ([place])   . Glaucoma    [facility]   . History of prostate cancer 2004  . Hyperlipidemia   . Hypertension   . Hypothyroidism   . Impaired fasting glucose   . Prostate cancer ([place]) 2004   Rad seed implants  . Spinal stenosis of lumbar region with radiculopathy      Past Surgical History:  Procedure Laterality Date  . CAROTID ENDARTERECTOMY Right 09/2004   [place] GRAFT  2003   [place]   after fall  . INSERTION PROSTATE RADIATION SEED  2004   and RT  . POSTERIOR LUMBAR FUSION  2012, 2014  . RIGHT/LEFT HEART CATH AND CORONARY ANGIOGRAPHY N/A 02/14/2017   Procedure: RIGHT/LEFT HEART CATH AND CORONARY ANGIOGRAPHY;  Surgeon: Wellington Hampshire, MD;  Location: [facility];  Service: Cardiovascular;  Laterality: N/A;    Family History  Problem Relation Age of Onset  . Cancer Mother        colon (cause of death) and breast  . Glaucoma Mother   . Cancer Father        colon  . Stroke Maternal Grandfather   . Diabetes Neg Hx     Social History   Socioeconomic History  . Marital status: Married    Spouse name: Not on file  . Number of children: 5  . Years of education: Not on file  . Highest education level: Not on file  Occupational History  . 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 Financial resource strain: Not on file  . Food insecurity:    Worry: Not on file    Inability: Not on file  . Transportation needs:    Medical: Not on file    Non-medical: Not on file  Tobacco Use  . Smoking status: Former Smoker    Packs/day: 0.25    Years: 18.00    Pack years: 4.50    Last attempt to quit: 1978    Years since quitting: 41.7  . Smokeless tobacco: Never Used  . Tobacco comment: Quit in 1978  Substance and Sexual Activity  . Alcohol use: Yes  . Drug use: No  . Sexual activity: Not on file  Lifestyle  . Physical activity:    Days per week: Not on file    Minutes per session: Not on file  . Stress: Not on file  Relationships  . Social connections:    Talks on phone: Not on file    Gets together: Not on file    Attends religious service: Not on file    Active member of club or organization: Not on file     Attends meetings of clubs or organizations: Not on file    Relationship status: Not on file  . Intimate partner violence:    Fear of current or ex partner: Not on file  Emotionally abused: Not on file    Physically abused: Not on file    Forced sexual activity: Not on file  Other Topics Concern  . Not on file  Social History Narrative   2nd marriage--- 1980   5 children--2 step children   12 grandchildren   65 great grandchildren      Has living will   Wife is health care POA--- son Herbie [place] is alternate   Would accept resuscitation   Not sure about tube feeds   Review of Systems Slight bleeding from right nare--discussed using neosporin on Q-tip Only sleeps 2 hours at a time--but can get back to sleep. This is long standing. Appetite is fair Weight stable Hernia is about the same Bowels are regular. No blood---uses dulcolax fairly regular now  Has daytime urinary frequency as well. Flow is okay--but slow Wears seat belt Teeth okay--keeps up with dentist Skin is fine. No suspicious lesions Uses the tramadol for his back pain--this still limits him No heartburn. Swallowing issues are much better     Objective:   Physical Exam  Constitutional: He is oriented to person, place, and time. He appears well-developed. No distress.  HENT:  Mouth/Throat: Oropharynx is clear and moist. No oropharyngeal exudate.  Neck: No thyromegaly present.  Cardiovascular: Normal rate, regular rhythm, normal heart sounds and intact distal pulses. Exam reveals no gallop.  No murmur heard. Respiratory: Effort normal and breath sounds normal. No respiratory distress. He has no wheezes. He has no rales.  GI: Soft. There is no tenderness.  Musculoskeletal: He exhibits no edema or tenderness.  Lymphadenopathy:    He has no cervical adenopathy.  Neurological: He is alert and oriented to person, place, and time.  President--- "Dwaine Deter, [place]" [phone] [id number] Recall 3/3  Skin: No  rash noted. No erythema.  Psychiatric: He has a normal mood and affect. His behavior is normal.           Assessment & Plan:

## 2017-11-12 NOTE — Assessment & Plan Note (Addendum)
Compensated now On isosorbide and hydralazine If renal function stable, can consider low ARB Will change the metolazone to prn

## 2017-11-12 NOTE — Assessment & Plan Note (Signed)
I have personally reviewed the Medicare Annual Wellness questionnaire and have noted 1. The patient's medical and social history 2. Their use of alcohol, tobacco or illicit drugs 3. Their current medications and supplements 4. The patient's functional ability including ADL's, fall risks, home safety risks and hearing or visual             impairment. 5. Diet and physical activities 6. Evidence for depression or mood disorders  The patients weight, height, BMI and visual acuity have been recorded in the chart I have made referrals, counseling and provided education to the patient based review of the above and I have provided the pt with a written personalized care plan for preventive services.  I have provided you with a copy of your personalized plan for preventive services. Please take the time to review along with your updated medication list.  Yearly flu vaccine Discussed resistance exercise No cancer screening due to age

## 2017-11-12 NOTE — Assessment & Plan Note (Signed)
Seems to be euthyroid Will check labs 

## 2017-11-12 NOTE — Assessment & Plan Note (Signed)
Will continue on the eliquis

## 2017-11-12 NOTE — Assessment & Plan Note (Signed)
Uses the tramadol prn

## 2017-11-12 NOTE — Progress Notes (Signed)
Hearing Screening Comments: Wears hearing aids. Has them in today. Vision Screening Comments: July 2019

## 2017-11-12 NOTE — Assessment & Plan Note (Signed)
Will recheck labs 

## 2017-11-12 NOTE — Assessment & Plan Note (Signed)
Will check labs No rush to meds

## 2017-11-12 NOTE — Addendum Note (Signed)
Addended by: Pilar Grammes on: 11/12/2017 03:25 PM   Modules accepted: Orders

## 2017-11-12 NOTE — Assessment & Plan Note (Signed)
See social history 

## 2017-11-13 LAB — CBC
HEMATOCRIT: 43.2 % (ref 39.0–52.0)
HEMOGLOBIN: 14.1 g/dL (ref 13.0–17.0)
MCHC: 32.7 g/dL (ref 30.0–36.0)
MCV: 83.3 fl (ref 78.0–100.0)
Platelets: 148 10*3/uL — ABNORMAL LOW (ref 150.0–400.0)
RBC: 5.19 Mil/uL (ref 4.22–5.81)
RDW: 20.9 % — AB (ref 11.5–15.5)
WBC: 7.1 10*3/uL (ref 4.0–10.5)

## 2017-11-13 LAB — COMPREHENSIVE METABOLIC PANEL
ALT: 41 U/L (ref 0–53)
AST: 31 U/L (ref 0–37)
Albumin: 4.1 g/dL (ref 3.5–5.2)
Alkaline Phosphatase: 59 U/L (ref 39–117)
BUN: 57 mg/dL — ABNORMAL HIGH (ref 6–23)
CO2: 31 meq/L (ref 19–32)
CREATININE: 1.32 mg/dL (ref 0.40–1.50)
Calcium: 9.7 mg/dL (ref 8.4–10.5)
Chloride: 101 mEq/L (ref 96–112)
GFR: 54.51 mL/min — AB (ref >60.00)
GLUCOSE: 89 mg/dL (ref 70–99)
Potassium: 4.9 mEq/L (ref 3.5–5.1)
Sodium: 140 mEq/L (ref 135–145)
Total Bilirubin: 0.6 mg/dL (ref 0.2–1.2)
Total Protein: 7.2 g/dL (ref 6.0–8.3)

## 2017-11-13 LAB — T4, FREE: Free T4: 1.16 ng/dL (ref 0.60–1.60)

## 2017-11-13 LAB — TSH: TSH: 0.79 u[IU]/mL (ref 0.35–4.50)

## 2017-11-13 LAB — HEMOGLOBIN A1C: Hgb A1c MFr Bld: 7.1 % — ABNORMAL HIGH (ref 4.6–6.5)

## 2017-11-26 DIAGNOSIS — J3801 Paralysis of vocal cords and larynx, unilateral: Secondary | ICD-10-CM | POA: Diagnosis not present

## 2017-11-26 DIAGNOSIS — J383 Other diseases of vocal cords: Secondary | ICD-10-CM | POA: Diagnosis not present

## 2017-11-26 DIAGNOSIS — J384 Edema of larynx: Secondary | ICD-10-CM | POA: Diagnosis not present

## 2017-11-26 DIAGNOSIS — L538 Other specified erythematous conditions: Secondary | ICD-10-CM | POA: Diagnosis not present

## 2017-12-12 ENCOUNTER — Ambulatory Visit (INDEPENDENT_AMBULATORY_CARE_PROVIDER_SITE_OTHER): Payer: Medicare PPO

## 2017-12-12 DIAGNOSIS — I6529 Occlusion and stenosis of unspecified carotid artery: Secondary | ICD-10-CM | POA: Diagnosis not present

## 2017-12-17 ENCOUNTER — Emergency Department: Payer: Medicare PPO

## 2017-12-17 ENCOUNTER — Other Ambulatory Visit: Payer: Self-pay

## 2017-12-17 ENCOUNTER — Encounter: Payer: Self-pay | Admitting: Emergency Medicine

## 2017-12-17 ENCOUNTER — Emergency Department
Admission: EM | Admit: 2017-12-17 | Discharge: 2017-12-17 | Disposition: A | Discharge status: Home / Self Care | Payer: Medicare PPO | Attending: Emergency Medicine | Admitting: Emergency Medicine

## 2017-12-17 DIAGNOSIS — I5042 Chronic combined systolic (congestive) and diastolic (congestive) heart failure: Secondary | ICD-10-CM | POA: Insufficient documentation

## 2017-12-17 DIAGNOSIS — Z7901 Long term (current) use of anticoagulants: Secondary | ICD-10-CM | POA: Diagnosis not present

## 2017-12-17 DIAGNOSIS — I130 Hypertensive heart and chronic kidney disease with heart failure and stage 1 through stage 4 chronic kidney disease, or unspecified chronic kidney disease: Secondary | ICD-10-CM | POA: Insufficient documentation

## 2017-12-17 DIAGNOSIS — N183 Chronic kidney disease, stage 3 (moderate): Secondary | ICD-10-CM | POA: Insufficient documentation

## 2017-12-17 DIAGNOSIS — E039 Hypothyroidism, unspecified: Secondary | ICD-10-CM | POA: Insufficient documentation

## 2017-12-17 DIAGNOSIS — Z8546 Personal history of malignant neoplasm of prostate: Secondary | ICD-10-CM | POA: Diagnosis not present

## 2017-12-17 DIAGNOSIS — M109 Gout, unspecified: Secondary | ICD-10-CM

## 2017-12-17 DIAGNOSIS — Z79899 Other long term (current) drug therapy: Secondary | ICD-10-CM | POA: Diagnosis not present

## 2017-12-17 DIAGNOSIS — M25532 Pain in left wrist: Secondary | ICD-10-CM | POA: Diagnosis not present

## 2017-12-17 DIAGNOSIS — R7303 Prediabetes: Secondary | ICD-10-CM | POA: Diagnosis not present

## 2017-12-17 DIAGNOSIS — Z87891 Personal history of nicotine dependence: Secondary | ICD-10-CM | POA: Insufficient documentation

## 2017-12-17 DIAGNOSIS — I2510 Atherosclerotic heart disease of native coronary artery without angina pectoris: Secondary | ICD-10-CM | POA: Diagnosis not present

## 2017-12-17 LAB — CBC WITH DIFFERENTIAL/PLATELET
Abs Immature Granulocytes: 0.03 10*3/uL (ref 0.00–0.07)
BASOS ABS: 0 10*3/uL (ref 0.0–0.1)
BASOS PCT: 0 %
EOS ABS: 0 10*3/uL (ref 0.0–0.5)
Eosinophils Relative: 0 %
HCT: 45.4 % (ref 39.0–52.0)
Hemoglobin: 14.3 g/dL (ref 13.0–17.0)
IMMATURE GRANULOCYTES: 0 %
LYMPHS ABS: 1.1 10*3/uL (ref 0.7–4.0)
Lymphocytes Relative: 12 %
MCH: 27.6 pg (ref 26.0–34.0)
MCHC: 31.5 g/dL (ref 30.0–36.0)
MCV: 87.5 fL (ref 80.0–100.0)
Monocytes Absolute: 1.3 10*3/uL — ABNORMAL HIGH (ref 0.1–1.0)
Monocytes Relative: 14 %
NEUTROS PCT: 74 %
NRBC: 0 % (ref 0.0–0.2)
Neutro Abs: 6.9 10*3/uL (ref 1.7–7.7)
PLATELETS: 159 10*3/uL (ref 150–400)
RBC: 5.19 MIL/uL (ref 4.22–5.81)
RDW: 18.6 % — AB (ref 11.5–15.5)
WBC: 9.2 10*3/uL (ref 4.0–10.5)

## 2017-12-17 LAB — COMPREHENSIVE METABOLIC PANEL
ALBUMIN: 3.7 g/dL (ref 3.5–5.0)
ALT: 29 U/L (ref 0–44)
AST: 32 U/L (ref 15–41)
Alkaline Phosphatase: 62 U/L (ref 38–126)
Anion gap: 13 (ref 5–15)
BUN: 36 mg/dL — AB (ref 8–23)
CHLORIDE: 97 mmol/L — AB (ref 98–111)
CO2: 29 mmol/L (ref 22–32)
Calcium: 9.2 mg/dL (ref 8.9–10.3)
Creatinine, Ser: 1.31 mg/dL — ABNORMAL HIGH (ref 0.61–1.24)
GFR calc Af Amer: 55 mL/min — ABNORMAL LOW (ref >60)
GFR calc non Af Amer: 47 mL/min — ABNORMAL LOW (ref >60)
GLUCOSE: 185 mg/dL — AB (ref 70–99)
POTASSIUM: 3.1 mmol/L — AB (ref 3.5–5.1)
SODIUM: 139 mmol/L (ref 135–145)
Total Bilirubin: 1.5 mg/dL — ABNORMAL HIGH (ref 0.3–1.2)
Total Protein: 7.1 g/dL (ref 6.5–8.1)

## 2017-12-17 LAB — URIC ACID: Uric Acid, Serum: 8.8 mg/dL — ABNORMAL HIGH (ref 3.7–8.6)

## 2017-12-17 MED ORDER — DEXAMETHASONE SODIUM PHOSPHATE 10 MG/ML IJ SOLN
10.0000 mg | Freq: Once | INTRAMUSCULAR | Status: AC
  Administered 2017-12-17: 10 mg via INTRAMUSCULAR
  Filled 2017-12-17: qty 1

## 2017-12-17 MED ORDER — PREDNISONE 10 MG PO TABS: ORAL_TABLET | ORAL | 0 refills | Status: DC

## 2017-12-17 NOTE — ED Notes (Signed)
See triage note  Presents with pain and swelling to left wrist  States pain started last Thursday   Denies any injury  Positive swelling  Wrist is very tender to touch  Good pulses  Also states pain has moved up into arm

## 2017-12-17 NOTE — ED Provider Notes (Signed)
[facility] Emergency Department Provider Note  ____________________________________________   First MD Initiated Contact with Patient 12/17/17 [phone]     (approximate)  I have reviewed the triage vital signs and the nursing notes.   HISTORY  Chief Complaint Wrist Pain   HPI Dennis Burns is a 84 y.o. male presents to the ED with complaint of left wrist painful and swelling for the last 3 to 4 days.  Patient states it is painful to move his wrist.  He denies any injury.  He denies any history of gout.  He has taken tramadol 100 mg this a.m. without any relief of his wrist pain.  He states that the tramadol is actually for chronic back pain.  He rates his pain as 9/10.  Past Medical History:  Diagnosis Date  . Allergic rhinitis due to pollen    as child--better now  . Chronic kidney disease, stage III (moderate) (HCC)   . Coronary artery disease   . DVT, lower extremity, recurrent ([place])   . Glaucoma    [facility]   . History of prostate cancer 2004  . Hyperlipidemia   . Hypertension   . Hypothyroidism   . Impaired fasting glucose   . Prostate cancer ([place]) 2004   Rad seed implants  . Spinal stenosis of lumbar region with radiculopathy     Patient Active Problem List   Diagnosis Date Noted  . Advance directive discussed with patient 11/12/2017  . Right inguinal hernia 10/18/2017  . Hoarseness 07/25/2017  . Mood disorder ([place]) 07/25/2017  . Chronic combined systolic and diastolic heart failure ([place])   . Effort angina ([place]) 02/14/2017  . Positive cardiac stress test 02/04/2017  . Paronychia of finger 06/13/2016  . Preventative health care 05/03/2016  . Pulmonary hypertension ([place]) 05/03/2016  . PAD (peripheral artery disease) ([place]) 04/18/2016  . Trigger finger 03/08/2016  . DOE (dyspnea on exertion) 11/02/2015  . Spinal stenosis of lumbar region with radiculopathy   . Coronary artery disease   . History of prostate cancer   .  Hyperlipidemia   . Hypertension   . Chronic kidney disease, stage III (moderate) (HCC)   . DVT, lower extremity, recurrent ([place])   . Glaucoma   . Hypothyroidism   . Prediabetes     Past Surgical History:  Procedure Laterality Date  . CAROTID ENDARTERECTOMY Right 09/2004   Orr GRAFT  2003   [place]   after fall  . INSERTION PROSTATE RADIATION SEED  2004   and RT  . POSTERIOR LUMBAR FUSION  2012, 2014  . RIGHT/LEFT HEART CATH AND CORONARY ANGIOGRAPHY N/A 02/14/2017   Procedure: RIGHT/LEFT HEART CATH AND CORONARY ANGIOGRAPHY;  Surgeon: Wellington Hampshire, MD;  Location: [place] [facility];  Service: Cardiovascular;  Laterality: N/A;    Prior to Admission medications   Medication Sig Start Date End Date Taking? Authorizing Provider  Alpha-Lipoic Acid 200 MG CAPS Take 1 capsule by mouth daily.    [provider]  apixaban (ELIQUIS) 2.5 MG TABS tablet Take 1 tablet (2.5 mg total) by mouth 2 (two) times daily. 05/21/17   Minna Merritts, MD  atorvastatin (LIPITOR) 40 MG tablet Take 40 mg by mouth daily.    [provider]  CALCIUM CITRATE PO Take 1 tablet by mouth daily. Takes 300mg  daily.    [provider]  carvedilol (COREG) 12.5 MG tablet Take 1 tablet (  12.5 mg total) by mouth 2 (two) times daily with a meal. 08/13/17   Gollan, Kathlene November, MD  Cholecalciferol (VITAMIN D) 2000 units CAPS Take 4,000 Units by mouth daily.    [provider]  Coenzyme Q10 (CO Q10) 200 MG CAPS Take 1 capsule by mouth daily.    [provider]  CRANBERRY EXTRACT PO Take 1 tablet by mouth daily. Takes 1500mg  daily    [provider]  ezetimibe (ZETIA) 10 MG tablet Take 1 tablet (10 mg total) by mouth daily. 02/16/17   Minna Merritts, MD  feeding supplement, ENSURE ENLIVE, (ENSURE ENLIVE) LIQD Take 237 mLs by mouth 2 (two) times daily between meals. 05/10/17   Bettey Costa, MD  Ferrous  Gluconate (IRON 27 PO) Take by mouth daily.    [provider]  furosemide (LASIX) 40 MG tablet Take 1.5 tablets (60 mg total) by mouth 2 (two) times daily. 11/07/17   Minna Merritts, MD  hydrALAZINE (APRESOLINE) 10 MG tablet Take 1 tablet (10 mg total) by mouth 3 (three) times daily. 05/25/17   [name], MD  isosorbide mononitrate (IMDUR) 60 MG 24 hr tablet Take 1 tablet (60 mg total) by mouth daily. 07/03/17 12/30/17  Minna Merritts, MD  latanoprost (XALATAN) 0.005 % ophthalmic solution Place 1 drop into both eyes at bedtime. In morning 10/27/15   [provider]  levothyroxine (SYNTHROID, LEVOTHROID) 75 MCG tablet Take 1 tablet (75 mcg total) by mouth daily. 08/27/17   [name], MD  metolazone (ZAROXOLYN) 2.5 MG tablet Take 1 tablet (2.5 mg total) by mouth daily as needed. If home weight >128# 11/12/17   [name], MD  Multiple Vitamin (MULTIVITAMIN) tablet Take 1 tablet by mouth daily.    [provider]  nitroGLYCERIN (NITROSTAT) 0.4 MG SL tablet Place 1 tablet (0.4 mg total) under the tongue every 5 (five) minutes as needed for chest pain. 05/10/17   Bettey Costa, MD  Omega 3-6-9 Fatty Acids (OMEGA-3-6-9 PO) Take 1 capsule by mouth 2 (two) times daily.    [provider]  potassium chloride (K-DUR) 10 MEQ tablet Take 1 tablet (10 mEq total) by mouth as directed. Take 3 times daily and 4 pills when taking metolazone 07/10/17   Gollan, Kathlene November, MD  predniSONE (DELTASONE) 10 MG tablet Take 4 tablets once a day for 2 days, then 3 tablets x 2 days, 2 tabs x 2 days and 1 tab x 2 days 12/17/17   Johnn Hai, PA-C  timolol (BETIMOL) 0.5 % ophthalmic solution Place 1 drop into both eyes 2 (two) times daily.    [provider]  traMADol (ULTRAM) 50 MG tablet Take 2 tablets (100 mg total) by mouth 3 (three) times daily as needed. 08/23/17   [name], MD    Allergies Patient has no known allergies.  Family History  Problem  Relation Age of Onset  . Cancer Mother        colon (cause of death) and breast  . Glaucoma Mother   . Cancer Father        colon  . Stroke Maternal Grandfather   . Diabetes Neg Hx     Social History Social History   Tobacco Use  . Smoking status: Former Smoker    Packs/day: 0.25    Years: 18.00    Pack years: 4.50    Last attempt to quit: 1978    Years since quitting: 41.8  . Smokeless tobacco:  Never Used  . Tobacco comment: Quit in 1978  Substance Use Topics  . Alcohol use: Yes  . Drug use: No    Review of Systems Constitutional: No fever/chills Cardiovascular: Denies chest pain. Respiratory: Denies shortness of breath. Musculoskeletal: Positive for left wrist pain. Skin: Negative for rash. Neurological: Negative for  focal weakness or numbness. ___________________________________________   PHYSICAL EXAM:  VITAL SIGNS: ED Triage Vitals  Enc Vitals Group     BP 12/17/17 0738 (!) 175/83     Pulse Rate 12/17/17 0738 98     Resp 12/17/17 0738 18     Temp 12/17/17 0738 97.8 F (36.6 C)     Temp Source 12/17/17 0738 Oral     SpO2 12/17/17 0738 97 %     Weight 12/17/17 0741 129 lb (58.5 kg)     Height 12/17/17 0741 5\' 3"  (1.6 m)     Head Circumference --      Peak Flow --      Pain Score 12/17/17 0741 9     Pain Loc --      Pain Edu? --      Excl. in [place]? --    Constitutional: Alert and oriented. Well appearing and in no acute distress. Eyes: Conjunctivae are normal.  Head: Atraumatic. Neck: No stridor.   Cardiovascular: Normal rate, regular rhythm. Grossly normal heart sounds.  Good peripheral circulation. Respiratory: Normal respiratory effort.  No retractions. Lungs CTAB. Gastrointestinal: Soft and nontender. No distention.  Musculoskeletal: Examination of the left wrist there is moderate soft tissue swelling with mild erythema present.  There is moderate warmth to touch.  Patient is markedly tender to light palpation and is unable to tolerate any range  of motion.  Skin is intact.  No ecchymosis or abrasions.  Capillary refill is less than 3 seconds and motor sensory function is intact. Neurologic:  Normal speech and language. No gross focal neurologic deficits are appreciated.  Skin:  Skin is warm, dry and intact.  Psychiatric: Mood and affect are normal. Speech and behavior are normal.  ____________________________________________   LABS (all labs ordered are listed, but only abnormal results are displayed)  Labs Reviewed  CBC WITH DIFFERENTIAL/PLATELET - Abnormal; Notable for the following components:      Result Value   RDW 18.6 (*)    Monocytes Absolute 1.3 (*)    All other components within normal limits  COMPREHENSIVE METABOLIC PANEL - Abnormal; Notable for the following components:   Potassium 3.1 (*)    Chloride 97 (*)    Glucose, Bld 185 (*)    BUN 36 (*)    Creatinine, Ser 1.31 (*)    Total Bilirubin 1.5 (*)    GFR calc non Af Amer 47 (*)    GFR calc Af Amer 55 (*)    All other components within normal limits  URIC ACID - Abnormal; Notable for the following components:   Uric Acid, Serum 8.8 (*)    All other components within normal limits    RADIOLOGY  ED MD interpretation:   Left wrist x-ray is negative for acute bony abnormality.  Moderate arthritis changes.  Official radiology report(s): Dg Wrist Complete Left  Result Date: 12/17/2017 CLINICAL DATA:  Left wrist pain EXAM: LEFT WRIST - COMPLETE 3+ VIEW COMPARISON:  None. FINDINGS: Arthritic changes within the left wrist, most notable in the radiocarpal joint and 1st carpometacarpal joint as well as the mid carpal row between scaphoid and trapezium. Joint space narrowing and spurring. No acute  bony abnormality. Specifically, no fracture, subluxation, or dislocation. Chondrocalcinosis. Vascular calcifications noted. 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 No acute bony abnormality. Electronically Signed   By: Rolm Baptise M.D.   On:  12/17/2017 09:33    ____________________________________________   PROCEDURES  Procedure(s) performed:   .Splint Application Date/Time: 29/56/2130 12:43 PM Performed by: Porfirio Mylar, NT Authorized by: Johnn Hai, PA-C   Consent:    Consent obtained:  Verbal   Consent given by:  Patient and spouse   Risks discussed:  Pain   Alternatives discussed:  Referral Pre-procedure details:    Sensation:  Normal Procedure details:    Laterality:  Right   Location:  Wrist   Wrist:  L wrist   Strapping: no     Splint type:  Volar short arm   Supplies:  Ortho-Glass Post-procedure details:    Pain:  Improved   Sensation:  Normal   Patient tolerance of procedure:  Tolerated well, no immediate complications    Critical Care performed: No  ____________________________________________   INITIAL IMPRESSION / ASSESSMENT AND PLAN / ED COURSE  As part of my medical decision making, I reviewed the following data within the electronic MEDICAL RECORD NUMBER Notes from prior ED visits and [place] Controlled Substance Database  Patient presents to the ED with complaint of left wrist pain for 3 days without any history of injury.  Area is markedly tender and warm to touch.  There is some erythema noted.  Patient chronically takes tramadol 100 mg 3 times daily for his back issues.  Patient was given Decadron 10 mg IM.  X-rays were obtained to rule out a occult fracture.  X-rays were negative for acute bony injury.  Lab work revealed uric acid was elevated.  Patient will continue taking his tramadol as prescribed by his doctor.  He was also placed on a prednisone taper pack.  He was placed in an volar OCL splint for protection and support of his wrist.  He was also given a sling to help with elevation.  He is to follow-up with his PCP if any continued problems.  He has wife were told to return to the emergency department if any severe worsening of his  symptoms.  ____________________________________________   FINAL CLINICAL IMPRESSION(S) / ED DIAGNOSES  Final diagnoses:  Acute gout of left wrist, unspecified cause     ED Discharge Orders         Ordered    predniSONE (DELTASONE) 10 MG tablet     12/17/17 1117           Note:  This document was prepared using Dragon voice recognition software and may include unintentional dictation errors.    Johnn Hai, PA-C 12/17/17 1246    Harvest Dark, MD 12/17/17 [phone]

## 2017-12-17 NOTE — ED Triage Notes (Signed)
L wrist pain x 3 days without injury.

## 2017-12-17 NOTE — ED Notes (Signed)
First Nurse Note: Patient complaining of pain left wrist, carrying arm close to body, swelling noted.  Ice Pack given.

## 2017-12-17 NOTE — Discharge Instructions (Signed)
Follow-up with your primary care provider for evaluation of your left wrist.  Elevate to reduce swelling and also wear splint for protection and support.  Begin taking prednisone as directed.  You may continue taking your tramadol that you currently take at home with this medication.

## 2017-12-18 NOTE — Telephone Encounter (Signed)
Forward to Dr Darnell Level in Dr Alla German absence

## 2017-12-20 MED ORDER — TRAMADOL HCL 50 MG PO TABS: 100.0000 mg | ORAL_TABLET | Freq: Three times a day (TID) | ORAL | 0 refills | Status: DC | PRN

## 2017-12-20 NOTE — Telephone Encounter (Signed)
Will forward to PCP 

## 2017-12-24 ENCOUNTER — Other Ambulatory Visit: Payer: Self-pay | Admitting: *Deleted

## 2017-12-24 DIAGNOSIS — I6523 Occlusion and stenosis of bilateral carotid arteries: Secondary | ICD-10-CM

## 2018-01-04 MED ORDER — PREDNISONE 10 MG PO TABS: ORAL_TABLET | ORAL | 0 refills | Status: DC

## 2018-02-04 DIAGNOSIS — J3801 Paralysis of vocal cords and larynx, unilateral: Secondary | ICD-10-CM | POA: Diagnosis not present

## 2018-02-06 DIAGNOSIS — N183 Chronic kidney disease, stage 3 (moderate): Secondary | ICD-10-CM | POA: Diagnosis not present

## 2018-02-06 DIAGNOSIS — I10 Essential (primary) hypertension: Secondary | ICD-10-CM | POA: Diagnosis not present

## 2018-02-06 DIAGNOSIS — N261 Atrophy of kidney (terminal): Secondary | ICD-10-CM | POA: Diagnosis not present

## 2018-02-06 DIAGNOSIS — R739 Hyperglycemia, unspecified: Secondary | ICD-10-CM | POA: Diagnosis not present

## 2018-02-06 DIAGNOSIS — R600 Localized edema: Secondary | ICD-10-CM | POA: Diagnosis not present

## 2018-02-12 DIAGNOSIS — I129 Hypertensive chronic kidney disease with stage 1 through stage 4 chronic kidney disease, or unspecified chronic kidney disease: Secondary | ICD-10-CM | POA: Diagnosis not present

## 2018-02-12 DIAGNOSIS — N261 Atrophy of kidney (terminal): Secondary | ICD-10-CM | POA: Diagnosis not present

## 2018-02-12 DIAGNOSIS — R600 Localized edema: Secondary | ICD-10-CM | POA: Diagnosis not present

## 2018-02-12 DIAGNOSIS — N183 Chronic kidney disease, stage 3 (moderate): Secondary | ICD-10-CM | POA: Diagnosis not present

## 2018-02-17 ENCOUNTER — Other Ambulatory Visit: Payer: Self-pay | Admitting: Cardiovascular Disease

## 2018-02-19 NOTE — Progress Notes (Signed)
Cardiology Office Note Date:  02/21/2018  Patient ID:  Emigdio Wildeman, DOB Jul 21, 1928, MRN [id number] PCP:  [name], MD  Cardiologist:  Dr. [name], MD    Chief Complaint: Fatigue and sleeping alot  History of Present Illness: Jhovani Griswold is a 84 y.o. male with history of CAD s/p CABG x 4 in 2003, HFrEF, pulmonary hypertension, valvular heart disease, chronic SOB, carotid artery disease s/p right-sided CEA, PAD, DVT on Xarelto, HTN, HLD, prostate cancer, CKD stage III with unilateral kidney, sciatica, and spinal stenosis s/p prior back surgeries who presents for evaluation of fatigue and increased sleeping.  Myoview 01/2017 for SOB showed a large region of ischemia noted in the lateral and mid to apical wall, fixed defect in the inferolateral wall EF 23%, high risk scan. Follow up echo in 01/2017 showed EF 55-60%, normal wall motion, Gr2DD, moderate aortic stenosis, moderate aortic regurgitation, mild mitral regurgitation, mildly dilated left atrium, mildly reduced RV systolic function. Cardiac cath from 02/14/2017 showed severe underlying heavily calcified three-vessel CAD, left main 80%, proximal to mid LAD 100%, ostial LCx 99%, OM1 90%, ostial RCA 100%. LIMA to LAD patent, SVG-distal RCA with mild luminal irregularities. SVG to OM was felt to possibly be occluded (versus mild competitive flow in the distal LCx) and was not visable on ascending aortogram. RHC showed only mildly elevated filling pressures, moderate pulmonary hypertension and normal cardiac output. Pulmonary wedge pressure was 16 mmHg, PA pressure was 55/13 mmHg and CO was 5.84 with a CI of 3.5. LV gram was not performed given CKD. The native LCx was felt to be too high risk for PCI given involvement of the left main, ostial location and heavy calcifications. Medical management was advised. He was seen in the office in 04/2017 with increased SOB, increased orthopnea, and weight gain of ~ 10 pounds. He was felt to be volume  overloaded and sent to the ED where he was admitted. He was noted to have a NSTEMI with a peak troponin of 12.08. He was medically managed given his underlying CKD with consideration for outpatient cath. Echo showed a reduced EF of 35-40%, mild LVH, probable HK of the apicalanterior, lateral, apical, basal midinferolateral myocardium, moderate AS that was felt to possibly be underestimated given his low EF, moderate to severe MR, mildly dilated LA, RVSF mildly reduced, moderate TR, PASP 70-75, left pleural effusion. He required augmentation of diuresis with milrinone with good UOP. During his admission he was noted to have runs of SVT and frequent PVCs. He was not felt to be a likely candidate for his valvular heart disease given his comorbid conditions. Discharge weight of 129 pounds. In follow up since his admission, he has continued to note SOB with continued medical management being advised. 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 His at home goal weight has been felt to be 125 pounds.   Carotid artery ultrasound from 12/2017 showed 1-39% RICA stenosis, 46-56% LICA stenosis, stenotic left subclavian artery with disturbance of flow in the right subclavian artery. Followed up was advised in 12/2018.   He was last seen in the office in 09/2017 with a stable weight of 129 pounds. He was continued on Lasix 60 mg bid with prn metolazone for weight > 129 pounds.   Labs: 12/2017 - WBC 9.2, HGB 14.3, PLT 159, K+ 3.1, SCr 1.31 (baseline ~ 1.2-1.3), AST/ALT normal 11/2017 - TSH normal, A1c 7.1 04/2017 - LDL 58  Patient comes  in today noting a 2 to 3-week history of increased fatigue and sleepiness.  Patient indicates he typically goes to bed around 9 or 10:00 at night and will wake up around 7 in the morning and take his morning medications.  Over the past month, following this, he will then go back to bed and sleep for another 4 to 5 hours.  However, he notes if he  forces himself to get up he is generally active and notes improved energy level with less somnolence.  He states "the bed is so warm and nice."  He also has noted a significant drop off in weight over the past week since increasing his metolazone to 2.5 mg 3 times weekly.  In this setting, his weight has decreased from 125 pounds to a current value of 118 pounds.  Patient indicates he was advised to take metolazone 3 times weekly by his nephrologist.  He continues to eat a heart healthy diet.  No chest pain, palpitations, dizziness, presyncope, or syncope.  Weights at home have ranged from a high of 132 pounds a couple of months ago to a current low of 118 pounds.  Dry weight is approximately 125 pounds.  He denies any falls since last being seen.  No BRBPR or melena.  He has a stable 2 pillow orthopnea.  Patient indicates that since he had to get up this morning for this appointment he has felt quite well and is not fatigued.  Past Medical History:  Diagnosis Date  . Allergic rhinitis due to pollen    as child--better now  . Chronic kidney disease, stage III (moderate) (HCC)   . Coronary artery disease   . DVT, lower extremity, recurrent ([place])   . Glaucoma    [facility]   . History of prostate cancer 2004  . Hyperlipidemia   . Hypertension   . Hypothyroidism   . Impaired fasting glucose   . Prostate cancer ([place]) 2004   Rad seed implants  . Spinal stenosis of lumbar region with radiculopathy     Past Surgical History:  Procedure Laterality Date  . CAROTID ENDARTERECTOMY Right 09/2004   [place] GRAFT  2003   [place]   after fall  . INSERTION PROSTATE RADIATION SEED  2004   and RT  . POSTERIOR LUMBAR FUSION  2012, 2014  . RIGHT/LEFT HEART CATH AND CORONARY ANGIOGRAPHY N/A 02/14/2017   Procedure: RIGHT/LEFT HEART CATH AND CORONARY ANGIOGRAPHY;  Surgeon: Wellington Hampshire, MD;  Location: [place] [facility];  Service:  Cardiovascular;  Laterality: N/A;    Current Meds  Medication Sig  . Alpha-Lipoic Acid 200 MG CAPS Take 1 capsule by mouth daily.  Marland Kitchen apixaban (ELIQUIS) 2.5 MG TABS tablet Take 1 tablet (2.5 mg total) by mouth 2 (two) times daily.  Marland Kitchen atorvastatin (LIPITOR) 40 MG tablet Take 40 mg by mouth daily.  Marland Kitchen CALCIUM CITRATE PO Take 1 tablet by mouth daily. Takes 300mg  daily.  . carvedilol (COREG) 12.5 MG tablet Take 1 tablet (12.5 mg total) by mouth 2 (two) times daily with a meal.  . Cholecalciferol (VITAMIN D) 2000 units CAPS Take 4,000 Units by mouth daily.  . Coenzyme Q10 (CO Q10) 200 MG CAPS Take 1 capsule by mouth daily.  Marland Kitchen CRANBERRY EXTRACT PO Take 1 tablet by mouth daily. Takes 1500mg  daily  . ezetimibe (ZETIA) 10 MG tablet TAKE 1 TABLET EVERY DAY  . feeding supplement,  ENSURE ENLIVE, (ENSURE ENLIVE) LIQD Take 237 mLs by mouth 2 (two) times daily between meals.  . Ferrous Gluconate (IRON 27 PO) Take by mouth daily.  . furosemide (LASIX) 40 MG tablet Take 1.5 tablets (60 mg total) by mouth 2 (two) times daily.  . hydrALAZINE (APRESOLINE) 10 MG tablet Take 1 tablet (10 mg total) by mouth 3 (three) times daily.  Marland Kitchen latanoprost (XALATAN) 0.005 % ophthalmic solution Place 1 drop into both eyes at bedtime. In morning  . levothyroxine (SYNTHROID, LEVOTHROID) 75 MCG tablet Take 1 tablet (75 mcg total) by mouth daily.  . metolazone (ZAROXOLYN) 2.5 MG tablet Take 1 tablet (2.5 mg total) by mouth daily as needed. If home weight >128#  . Multiple Vitamin (MULTIVITAMIN) tablet Take 1 tablet by mouth daily.  . nitroGLYCERIN (NITROSTAT) 0.4 MG SL tablet Place 1 tablet (0.4 mg total) under the tongue every 5 (five) minutes as needed for chest pain.  [name] 3-6-9 Fatty Acids (OMEGA-3-6-9 PO) Take 1 capsule by mouth 2 (two) times daily.  . potassium chloride (K-DUR) 10 MEQ tablet Take 1 tablet (10 mEq total) by mouth as directed. Take 3 times daily and 4 pills when taking metolazone  . predniSONE (DELTASONE) 10  MG tablet Take 4 tablets once a day for 2 days, then 3 tablets x 2 days, 2 tabs x 2 days and 1 tab x 2 days  . timolol (BETIMOL) 0.5 % ophthalmic solution Place 1 drop into both eyes 2 (two) times daily.  . traMADol (ULTRAM) 50 MG tablet Take 2 tablets (100 mg total) by mouth 3 (three) times daily as needed.    Allergies:   Patient has no known allergies.   Social History:  The patient  reports that he quit smoking about 41 years ago. He has a 4.50 pack-year smoking history. He has never used smokeless tobacco. He reports current alcohol use. He reports that he does not use drugs.   Family History:  The patient's family history includes Cancer in his father and mother; Glaucoma in his mother; Stroke in his maternal grandfather.  ROS:   Review of Systems  Constitutional: Positive for malaise/fatigue and weight loss. Negative for chills, diaphoresis and fever.       Increased somnolence  HENT: Negative for congestion.   Eyes: Negative for discharge and redness.  Respiratory: Negative for cough, hemoptysis, sputum production, shortness of breath and wheezing.   Cardiovascular: Negative for chest pain, palpitations, orthopnea, claudication, leg swelling and PND.  Gastrointestinal: Negative for abdominal pain, blood in stool, heartburn, melena, nausea and vomiting.  Genitourinary: Negative for hematuria.  Musculoskeletal: Negative for falls and myalgias.  Skin: Negative for rash.  Neurological: Positive for weakness. Negative for dizziness, tingling, tremors, sensory change, speech change, focal weakness and loss of consciousness.  Endo/Heme/Allergies: Does not bruise/bleed easily.  Psychiatric/Behavioral: Negative for substance abuse. The patient is not nervous/anxious.   All other systems reviewed and are negative.    PHYSICAL EXAM:  VS:  BP 130/62 (BP Location: Left Arm, Patient Position: Sitting, Cuff Size: Normal)   Pulse 63   Ht 5' 3.75" (1.619 m)   Wt 118 lb (53.5 kg)   BMI 20.41  kg/m  BMI: Body mass index is 20.41 kg/m.  Physical Exam  Constitutional: He is oriented to person, place, and time. He appears well-developed and well-nourished.  HENT:  Head: Normocephalic and atraumatic.  Eyes: Right eye exhibits no discharge. Left eye exhibits no discharge.  Neck: Normal range of motion. No JVD  present.  Cardiovascular: Normal rate, regular rhythm, S1 normal and S2 normal. Exam reveals no distant heart sounds, no friction rub, no midsystolic click and no opening snap.  Murmur heard. High-pitched blowing holosystolic murmur is present with a grade of 2/6 at the apex.  Harsh midsystolic murmur of grade 2/6 is also present at the upper right sternal border radiating to the neck. Pulses:      Posterior tibial pulses are 2+ on the right side and 2+ on the left side.  Pulmonary/Chest: Effort normal and breath sounds normal. No respiratory distress. He has no decreased breath sounds. He has no wheezes. He has no rales. He exhibits no tenderness.  Abdominal: Soft. He exhibits no distension. There is no abdominal tenderness.  Musculoskeletal:        General: No edema.  Neurological: He is alert and oriented to person, place, and time.  Skin: Skin is warm and dry. No cyanosis. Nails show no clubbing.  Psychiatric: He has a normal mood and affect. His speech is normal and behavior is normal. Judgment and thought content normal.     EKG:  Was ordered and interpreted by me today. Shows NSR, 63 bpm, left axis deviation, PVH with early repolarization, diffuse nonspecific st/t changes (unchanged from prior)  Recent Labs: 05/07/2017: B Natriuretic Peptide 3,671.0; Magnesium 2.1 11/12/2017: TSH 0.79 12/17/2017: ALT 29; BUN 36; Creatinine, Ser 1.31; Hemoglobin 14.3; Platelets 159; Potassium 3.1; Sodium 139  05/01/2017: Cholesterol 114; HDL 34; LDL Cholesterol 58; Total CHOL/HDL Ratio 3.4; Triglycerides 109; VLDL 22   CrCl cannot be calculated (Patient's most recent lab result is older  than the maximum 21 days allowed.).   Wt Readings from Last 3 Encounters:  02/21/18 118 lb (53.5 kg)  12/17/17 129 lb (58.5 kg)  11/12/17 128 lb (58.1 kg)     Other studies reviewed: Additional studies/records reviewed today include: summarized above  ASSESSMENT AND PLAN:  1. CAD status post CABG without angina: He does not have any symptoms concerning for chest pain at this time.  He was recently noted to have a non-STEMI in 2/201 with recommendation to pursue outpatient ischemic evaluation.  It appears this has been deferred.  We may need to revisit this should his symptoms of fatigue and somnolence persist.  However, we will need to trend his renal function as outlined below as there are some concerns he may be over diuresed which would preclude cardiac catheterization given his underlying CKD.  He is on Eliquis in place of aspirin.  Continue Lipitor and Zetia.  Potential outpatient ischemic evaluation pending the below work-up.  2. HFrEF/pulmonary hypertension: He does not appear volume overloaded.  If anything, he has volume depleted with a weight that is 7 pounds below his dry weight.  We are holding metolazone at this time pending his labs that were drawn today.  We will also likely need to at least temporarily decrease his Lasix pending labs.  He remains on Coreg, Imdur/hydralazine, and Lasix.  No ACE inhibitor/ARB/Entresto/spironolactone in the setting of his CKD.  CHF education.  3. Weight loss: Likely in the setting of decreased p.o. intake as well as dehydration following escalation of metolazone.  We are holding metolazone as below.  Patient's wife has now started to supplement with milkshakes.  Hopefully, with decreasing his diuretic dose his weight will trend back up to his baseline approximately 125 pounds.  4. Fatigue/somnolence: Likely multifactorial including decreased p.o. intake and a relative sedentary lifestyle lately.  He notes if he forces himself  to get up and get started  with his day he generally feels well and has considerably more energy.  I have encouraged him to continue this trend and monitor his fatigue.  We are checking a CBC and TSH.  Decrease carvedilol to 6.25 mg twice daily.  This is the dose that he was previously taking prior to his hospital admission in 04/2017.  If we see that he has an anemia we may need to suspend his anticoagulation.  5. CKD stage III: Cannot exclude AKI in the setting of increased metolazone usage.  Check BMP today.  For now, he has been advised to hold metolazone.  We will call once we have his labs back to advise on current diuretic dosing moving forward.  6. Prior DVT: Defer management of his anticoagulation to primary cardiologist.  Typically Eliquis is not reduced when treating DVT.  7. PAD: Typically walks daily though lately has felt too tired to do so.  No symptoms of claudication.  Continue medical management.  Disposition: F/u with Dr. [name] or an APP in 1 month.   Current medicines are reviewed at length with the patient today.  The patient did not have any concerns regarding medicines.  Signed, Christell Faith, PA-C 02/21/2018 11:23 AM     [facility] [address] [place], [place] [address] [phone]

## 2018-02-21 ENCOUNTER — Telehealth: Payer: Self-pay

## 2018-02-21 ENCOUNTER — Encounter: Payer: Self-pay | Admitting: Physician Assistant

## 2018-02-21 ENCOUNTER — Other Ambulatory Visit
Admission: RE | Admit: 2018-02-21 | Discharge: 2018-02-21 | Disposition: A | Discharge status: Home / Self Care | Payer: Medicare PPO | Source: Ambulatory Visit | Attending: Physician Assistant | Admitting: Physician Assistant

## 2018-02-21 ENCOUNTER — Ambulatory Visit: Payer: Medicare PPO | Admitting: Physician Assistant

## 2018-02-21 VITALS — BP 130/62 | HR 63 | Ht 63.75 in | Wt 118.0 lb

## 2018-02-21 DIAGNOSIS — N183 Chronic kidney disease, stage 3 unspecified: Secondary | ICD-10-CM

## 2018-02-21 DIAGNOSIS — I82401 Acute embolism and thrombosis of unspecified deep veins of right lower extremity: Secondary | ICD-10-CM | POA: Diagnosis not present

## 2018-02-21 DIAGNOSIS — I5022 Chronic systolic (congestive) heart failure: Secondary | ICD-10-CM | POA: Insufficient documentation

## 2018-02-21 DIAGNOSIS — I2510 Atherosclerotic heart disease of native coronary artery without angina pectoris: Secondary | ICD-10-CM | POA: Diagnosis not present

## 2018-02-21 DIAGNOSIS — R400 Somnolence: Secondary | ICD-10-CM

## 2018-02-21 DIAGNOSIS — R5383 Other fatigue: Secondary | ICD-10-CM | POA: Diagnosis not present

## 2018-02-21 DIAGNOSIS — R634 Abnormal weight loss: Secondary | ICD-10-CM | POA: Diagnosis not present

## 2018-02-21 DIAGNOSIS — I739 Peripheral vascular disease, unspecified: Secondary | ICD-10-CM | POA: Diagnosis not present

## 2018-02-21 LAB — CBC
HCT: 51 % (ref 39.0–52.0)
Hemoglobin: 15.9 g/dL (ref 13.0–17.0)
MCH: 27.7 pg (ref 26.0–34.0)
MCHC: 31.2 g/dL (ref 30.0–36.0)
MCV: 88.7 fL (ref 80.0–100.0)
Platelets: 266 10*3/uL (ref 150–400)
RBC: 5.75 MIL/uL (ref 4.22–5.81)
RDW: 15.5 % (ref 11.5–15.5)
WBC: 8.9 10*3/uL (ref 4.0–10.5)
nRBC: 0 % (ref 0.0–0.2)

## 2018-02-21 LAB — TSH: TSH: 0.851 u[IU]/mL (ref 0.350–4.500)

## 2018-02-21 LAB — BASIC METABOLIC PANEL
Anion gap: 14 (ref 5–15)
BUN: 53 mg/dL — ABNORMAL HIGH (ref 8–23)
CO2: 32 mmol/L (ref 22–32)
Calcium: 10.1 mg/dL (ref 8.9–10.3)
Chloride: 93 mmol/L — ABNORMAL LOW (ref 98–111)
Creatinine, Ser: 1.42 mg/dL — ABNORMAL HIGH (ref 0.61–1.24)
GFR calc Af Amer: 51 mL/min — ABNORMAL LOW (ref >60)
GFR calc non Af Amer: 44 mL/min — ABNORMAL LOW (ref >60)
Glucose, Bld: 126 mg/dL — ABNORMAL HIGH (ref 70–99)
Potassium: 3.3 mmol/L — ABNORMAL LOW (ref 3.5–5.1)
Sodium: 139 mmol/L (ref 135–145)

## 2018-02-21 MED ORDER — CARVEDILOL 12.5 MG PO TABS: 6.2500 mg | ORAL_TABLET | Freq: Two times a day (BID) | ORAL | 3 refills | Status: DC

## 2018-02-21 NOTE — Telephone Encounter (Signed)
Call to patient to review labs and provider suggestions as follows,  Please follow the recommendations from Christell Faith, PA,  1- hold metolazone at this time. only take for weight > 129 pounds.   2- Take an extra KCl 2 tablets (20 mEq) today 02/21/18.  Then take 1 tablet (10 mEq) daily starting tomorrow 02/22/18.  3- Recheck blood work (bmet) in 1 week to assess for improvement in potassium and kidney function with the above changes.   4- Follow up as planned 03/25/18 @ 11:30 AM with Christell Faith, PA.     Pt verbalized understanding. Advised pt to call for any further questions or concerns

## 2018-02-21 NOTE — Patient Instructions (Signed)
Medication Instructions:  Your physician has recommended you make the following change in your medication:  1- DECREASE Coreg to 0.5 tablet (6.25 mg total) twice daily. 2- HOLD Metolazone until further notice   If you need a refill on your cardiac medications before your next appointment, please call your pharmacy.   Lab work: Your physician recommends that you return for lab work today (CBC, BMET, TSH)  If you have labs (blood work) drawn today and your tests are completely normal, you will receive your results only by: Marland Kitchen MyChart Message (if you have MyChart) OR . A paper copy in the mail If you have any lab test that is abnormal or we need to change your treatment, we will call you to review the results.  Testing/Procedures: None ordered   Follow-Up: At [facility], you and your health needs are our priority.  As part of our continuing mission to provide you with exceptional heart care, we have created designated Provider Care Teams.  These Care Teams include your primary Cardiologist (physician) and Advanced Practice Providers (APPs -  Physician Assistants and Nurse Practitioners) who all work together to provide you with the care you need, when you need it. You will need a follow up appointment in 1 months. You may see Ida Rogue, MD or one of the following Advanced Practice Providers on your designated Care Team:   Murray Hodgkins, NP Christell Faith, PA-C . Marrianne Mood, PA-C

## 2018-02-21 NOTE — Telephone Encounter (Signed)
-----   Message from [name], PA-C sent at 02/21/2018  1:49 PM EST ----- Thyroid function is normal.  Potassium remains low.  Renal function is slightly higher than his baseline at 1.42 with a baseline ~ 1.3 BUN is elevated at 53.  Blood count is normal.   Labs suggest he is volume depleted, like we talked about at his visit.   I recommend he hold metolazone at this time. Would only take for weight > 129 pounds.  Take an extra KCl 20 mEq today.  Recheck bmet 1 week to assess for improvement in potassium and renal function with the above changes.  Follow up as planned.

## 2018-02-28 ENCOUNTER — Other Ambulatory Visit: Payer: Self-pay | Admitting: *Deleted

## 2018-02-28 ENCOUNTER — Other Ambulatory Visit
Admission: RE | Admit: 2018-02-28 | Discharge: 2018-02-28 | Disposition: A | Discharge status: Home / Self Care | Payer: Medicare PPO | Source: Ambulatory Visit | Attending: Physician Assistant | Admitting: Physician Assistant

## 2018-02-28 DIAGNOSIS — I5042 Chronic combined systolic (congestive) and diastolic (congestive) heart failure: Secondary | ICD-10-CM | POA: Diagnosis not present

## 2018-02-28 LAB — BASIC METABOLIC PANEL
Anion gap: 10 (ref 5–15)
BUN: 39 mg/dL — ABNORMAL HIGH (ref 8–23)
CO2: 30 mmol/L (ref 22–32)
Calcium: 9.5 mg/dL (ref 8.9–10.3)
Chloride: 99 mmol/L (ref 98–111)
Creatinine, Ser: 1.19 mg/dL (ref 0.61–1.24)
GFR calc non Af Amer: 55 mL/min — ABNORMAL LOW (ref >60)
Glucose, Bld: 179 mg/dL — ABNORMAL HIGH (ref 70–99)
Potassium: 3.5 mmol/L (ref 3.5–5.1)
SODIUM: 139 mmol/L (ref 135–145)

## 2018-03-07 ENCOUNTER — Telehealth: Payer: Self-pay

## 2018-03-07 DIAGNOSIS — I2510 Atherosclerotic heart disease of native coronary artery without angina pectoris: Secondary | ICD-10-CM

## 2018-03-07 NOTE — Telephone Encounter (Signed)
-----   Message from [name], PA-C sent at 03/07/2018 12:34 PM EST ----- Please have the patient come in next week for a recheck BMP.

## 2018-03-07 NOTE — Telephone Encounter (Signed)
Call to patient regarding orders requested by provider for BMP next week.  Pt verbalized understanding. Order placed for [facility].   Advised pt to call for any further questions or concerns

## 2018-03-09 ENCOUNTER — Other Ambulatory Visit: Payer: Self-pay | Admitting: Internal Medicine

## 2018-03-09 ENCOUNTER — Other Ambulatory Visit: Payer: Self-pay | Admitting: Cardiovascular Disease

## 2018-03-11 MED ORDER — ATORVASTATIN CALCIUM 40 MG PO TABS: 40.0000 mg | ORAL_TABLET | Freq: Every day | ORAL | 3 refills | Status: DC

## 2018-03-11 NOTE — Telephone Encounter (Signed)
Pt is taking 40 mg tablet qd. 90 refill Request. Please advise if ok to refill Atorvastatin.

## 2018-03-13 ENCOUNTER — Other Ambulatory Visit
Admission: RE | Admit: 2018-03-13 | Discharge: 2018-03-13 | Disposition: A | Discharge status: Home / Self Care | Payer: Medicare PPO | Source: Ambulatory Visit | Attending: Physician Assistant | Admitting: Physician Assistant

## 2018-03-13 DIAGNOSIS — I2510 Atherosclerotic heart disease of native coronary artery without angina pectoris: Secondary | ICD-10-CM | POA: Diagnosis not present

## 2018-03-13 LAB — BASIC METABOLIC PANEL
Anion gap: 11 (ref 5–15)
BUN: 42 mg/dL — ABNORMAL HIGH (ref 8–23)
CO2: 30 mmol/L (ref 22–32)
Calcium: 9.4 mg/dL (ref 8.9–10.3)
Chloride: 98 mmol/L (ref 98–111)
Creatinine, Ser: 1.22 mg/dL (ref 0.61–1.24)
GFR calc Af Amer: >60 mL/min (ref >60)
GFR calc non Af Amer: 53 mL/min — ABNORMAL LOW (ref >60)
GLUCOSE: 145 mg/dL — AB (ref 70–99)
Potassium: 3.5 mmol/L (ref 3.5–5.1)
Sodium: 139 mmol/L (ref 135–145)

## 2018-03-25 ENCOUNTER — Ambulatory Visit: Payer: Medicare PPO | Admitting: Physician Assistant

## 2018-04-10 ENCOUNTER — Ambulatory Visit: Payer: Medicare PPO | Admitting: Physician Assistant

## 2018-04-25 DIAGNOSIS — M65322 Trigger finger, left index finger: Secondary | ICD-10-CM | POA: Diagnosis not present

## 2018-04-25 DIAGNOSIS — M109 Gout, unspecified: Secondary | ICD-10-CM | POA: Diagnosis not present

## 2018-04-30 NOTE — Progress Notes (Signed)
Cardiology Office Note Date:  05/02/2018  Patient ID:  Dennis Burns, DOB Nov 11, 1928, MRN [id number] PCP:  [name], MD  Cardiologist:  Dr. [name], MD    Chief Complaint: Follow up  History of Present Illness: Dennis Burns is a 85 y.o. male with history of CAD s/p CABG x 4 in 2003, HFrEF secondary to ICM, pulmonary hypertension, valvular heart disease, chronic SOB, carotid artery disease s/p right-sided CEA, PAD, DVT on Eliquis, HTN, HLD, prostate cancer, CKD stage III with unilateral kidney, sciatica, and spinal stenosis s/p prior back surgeries who presents for follow up of CAD and ICM.  Myoview 01/2017 for SOB showed a large region of ischemia noted in the lateral and mid to apical wall, fixed defect in the inferolateral wall EF 23%, high risk scan. Follow up echo in 01/2017 showed EF 55-60%, normal wall motion, Gr2DD, moderate aortic stenosis, moderate aortic regurgitation, mild mitral regurgitation, mildly dilated left atrium, mildly reduced RV systolic function. Cardiac cath from 02/14/2017 showed severe underlying heavily calcified three-vessel CAD, left main 80%, proximal to mid LAD 100%, ostial LCx 99%, OM1 90%, ostial RCA 100%. LIMA to LAD patent, SVG-distal RCA with mild luminal irregularities. SVG to OM was felt to possibly be occluded (versus mild competitive flow in the distal LCx) and was not visable on ascending aortogram. RHC showed only mildly elevated filling pressures, moderate pulmonary hypertension and normal cardiac output. Pulmonary wedge pressure was 16 mmHg, PA pressure was 55/13 mmHg and CO was 5.84 with a CI of 3.5. LV gram was not performed given CKD. The native LCx was felt to be too high risk for PCI given involvement of the left main, ostial location and heavy calcifications. Medical management was advised. He was seen in the office in 04/2017 with increased SOB, increased orthopnea, and weight gain of ~ 10 pounds. He was felt to be volume overloaded and sent  to the ED where he was admitted. He was noted to have a NSTEMI with a peak troponin of 12.08. He was medically managed given his underlying CKD with consideration for outpatient cath. Echo showed a reduced EF of 35-40%, mild LVH, probable HK of the apicalanterior, lateral, apical, basal midinferolateral myocardium, moderate AS that was felt to possibly be underestimated given his low EF, moderate to severe MR, mildly dilated LA, RVSF mildly reduced, moderate TR, PASP 70-75, left pleural effusion. He required augmentation of diuresis with milrinone with good UOP. During his admission he was noted to have runs of SVT and frequent PVCs. He was not felt to be a likely candidate for his valvular heart disease given his comorbid conditions. Discharge weight of 129 pounds. In follow up since his admission, he has continued to note SOB with continued medical management being advised. 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 His at home goal weight has been felt to be 125 pounds.   Carotid artery ultrasound from 12/2017 showed 1-39% RICA stenosis, 23-76% LICA stenosis, stenotic left subclavian artery with disturbance of flow in the right subclavian artery. Followed up was advised in 12/2018.   Patient was seen in 02/2018 noting a 2 to 3-week history of increased fatigue and sleepiness.  However, he noted if he forced himself to get up for the day he generally felt well and had more energy.  There was a noted significant drop in weight following his metolazone having been changed to 2.5 mg 3 times weekly by nephrology with a weight  that had decreased from 125 pounds to a weight of 118 pounds.  Labs checked on 02/21/2018 showed a prerenal state with a BUN of 53 and serum creatinine 1.42 with a baseline of 1.3, mild hypokalemia with a potassium of 3.3, normal CBC, and normal TSH.  It was recommended he hold metolazone and only take that for a weight of greater than 129 pounds  as well as to replete his potassium.  With these changes, recheck BMP on 12/26 showed an improved potassium of 3.5, BUN 39, serum creatinine 1.19.  Patient sent [place] a message on 03/06/2018 noting an increase in weight of 10 pounds over 10 days with a weight trend from 117 to 127 pounds.  In this setting, the patient took 1 metolazone with potassium repletion.  Recheck BMP on 03/13/2018 showed a stable potassium of 3.5, BUN 42, serum creatinine 1.22.  Patient comes in accompanied by his wife today and is doing reasonably well from a cardiac perspective.  More recently, he has noted a slight increase in his energy level with less daytime somnolence however he is not yet back at his baseline.  Wife continues to indicate the patient snores and has apneic episodes during the night.  No prior sleep study.  Over the past 3 to 4 days he has slowly noted weight increase from a baseline 125 pounds to a max of 130.4 pounds on 2/26.  In this setting, he took a metolazone 2.5 mg on the evening of 2/26 with improvement in weight down to 125 pounds this morning.  He continues to note stable lower extremity swelling.  No orthopnea, abdominal distention, PND, or early satiety.  No falls since he was last seen.  No BRBPR or melena.  Tolerating Eliquis without issues.  He continues to be limited in a functional status secondary to low back pain with associated radiculopathy.  He has been treated with tramadol for this though this does not seem to be helping lately.   Past Medical History:  Diagnosis Date  . Allergic rhinitis due to pollen    as child--better now  . Chronic kidney disease, stage III (moderate) (HCC)   . Coronary artery disease   . DVT, lower extremity, recurrent ([place])   . Glaucoma    [facility]   . History of prostate cancer 2004  . Hyperlipidemia   . Hypertension   . Hypothyroidism   . Impaired fasting glucose   . Prostate cancer ([place]) 2004   Rad seed implants  . Spinal stenosis of lumbar region  with radiculopathy     Past Surgical History:  Procedure Laterality Date  . CAROTID ENDARTERECTOMY Right 09/2004   [place] GRAFT  2003   [place]   after fall  . INSERTION PROSTATE RADIATION SEED  2004   and RT  . POSTERIOR LUMBAR FUSION  2012, 2014  . RIGHT/LEFT HEART CATH AND CORONARY ANGIOGRAPHY N/A 02/14/2017   Procedure: RIGHT/LEFT HEART CATH AND CORONARY ANGIOGRAPHY;  Surgeon: Wellington Hampshire, MD;  Location: [facility];  Service: Cardiovascular;  Laterality: N/A;    Current Meds  Medication Sig  . Alpha-Lipoic Acid 200 MG CAPS Take 1 capsule by mouth daily.  Marland Kitchen apixaban (ELIQUIS) 2.5 MG TABS tablet Take 1 tablet (2.5 mg total) by mouth 2 (two) times daily.  Marland Kitchen atorvastatin (LIPITOR) 40 MG tablet Take 1 tablet (40 mg total) by mouth daily.  Marland Kitchen CALCIUM CITRATE  PO Take 1 tablet by mouth daily. Takes 300mg  daily.  . carvedilol (COREG) 12.5 MG tablet Take 0.5 tablets (6.25 mg total) by mouth 2 (two) times daily with a meal.  . Cholecalciferol (VITAMIN D) 2000 units CAPS Take 4,000 Units by mouth daily.  . Coenzyme Q10 (CO Q10) 200 MG CAPS Take 1 capsule by mouth daily.  Marland Kitchen CRANBERRY EXTRACT PO Take 1 tablet by mouth daily. Takes 1500mg  daily  . ezetimibe (ZETIA) 10 MG tablet TAKE 1 TABLET EVERY DAY  . feeding supplement, ENSURE ENLIVE, (ENSURE ENLIVE) LIQD Take 237 mLs by mouth 2 (two) times daily between meals.  . Ferrous Gluconate (IRON 27 PO) Take by mouth daily.  . furosemide (LASIX) 40 MG tablet Take 1.5 tablets (60 mg total) by mouth 2 (two) times daily.  . hydrALAZINE (APRESOLINE) 10 MG tablet TAKE 1 TABLET THREE TIMES DAILY  . latanoprost (XALATAN) 0.005 % ophthalmic solution Place 1 drop into both eyes at bedtime. In morning  . levothyroxine (SYNTHROID, LEVOTHROID) 75 MCG tablet Take 1 tablet (75 mcg total) by mouth daily.  . Multiple Vitamin (MULTIVITAMIN) tablet Take 1 tablet by mouth daily.  .  nitroGLYCERIN (NITROSTAT) 0.4 MG SL tablet Place 1 tablet (0.4 mg total) under the tongue every 5 (five) minutes as needed for chest pain.  [name] 3-6-9 Fatty Acids (OMEGA-3-6-9 PO) Take 1 capsule by mouth 2 (two) times daily.  . potassium chloride (K-DUR) 10 MEQ tablet Take 1 tablet (10 mEq total) by mouth as directed. Take 3 times daily and 4 pills when taking metolazone  . predniSONE (DELTASONE) 10 MG tablet Take 4 tablets once a day for 2 days, then 3 tablets x 2 days, 2 tabs x 2 days and 1 tab x 2 days  . timolol (BETIMOL) 0.5 % ophthalmic solution Place 1 drop into both eyes 2 (two) times daily.  . traMADol (ULTRAM) 50 MG tablet Take 2 tablets (100 mg total) by mouth 3 (three) times daily as needed.    Allergies:   Patient has no known allergies.   Social History:  The patient  reports that he quit smoking about 42 years ago. He has a 4.50 pack-year smoking history. He has never used smokeless tobacco. He reports current alcohol use. He reports that he does not use drugs.   Family History:  The patient's family history includes Cancer in his father and mother; Glaucoma in his mother; Stroke in his maternal grandfather.  ROS:   Review of Systems  Constitutional: Positive for malaise/fatigue. Negative for chills, diaphoresis, fever and weight loss.  HENT: Negative for congestion.   Eyes: Negative for discharge and redness.  Respiratory: Positive for shortness of breath. Negative for cough, hemoptysis, sputum production and wheezing.   Cardiovascular: Positive for leg swelling. Negative for chest pain, palpitations, orthopnea, claudication and PND.  Gastrointestinal: Negative for abdominal pain, blood in stool, heartburn, melena, nausea and vomiting.  Genitourinary: Negative for hematuria.  Musculoskeletal: Negative for falls and myalgias.  Skin: Negative for rash.  Neurological: Positive for weakness. Negative for dizziness, tingling, tremors, sensory change, speech change, focal  weakness and loss of consciousness.  Endo/Heme/Allergies: Does not bruise/bleed easily.  Psychiatric/Behavioral: Negative for substance abuse. The patient has insomnia. The patient is not nervous/anxious.   All other systems reviewed and are negative.    PHYSICAL EXAM:  VS:  BP (!) 146/66 (BP Location: Left Arm, Patient Position: Sitting, Cuff Size: Normal)   Ht 5\' 3"  (1.6 m)   Wt 128 lb (58.1  kg)   BMI 22.67 kg/m  BMI: Body mass index is 22.67 kg/m.  Physical Exam  Constitutional: He is oriented to person, place, and time. He appears well-developed and well-nourished.  HENT:  Head: Normocephalic and atraumatic.  Eyes: Right eye exhibits no discharge. Left eye exhibits no discharge.  Neck: Normal range of motion. No JVD present.  Cardiovascular: Normal rate, regular rhythm, S1 normal and S2 normal. Exam reveals no distant heart sounds, no friction rub, no midsystolic click and no opening snap.  Murmur heard.  Harsh midsystolic murmur is present with a grade of 2/6 at the upper right sternal border radiating to the neck. High-pitched blowing holosystolic murmur of grade 2/6 is also present at the apex. Pulses:      Carotid pulses are on the left side with bruit.      Posterior tibial pulses are 2+ on the right side and 2+ on the left side.  Pulmonary/Chest: Effort normal and breath sounds normal. No respiratory distress. He has no decreased breath sounds. He has no wheezes. He has no rales. He exhibits no tenderness.  Abdominal: Soft. He exhibits no distension. There is no abdominal tenderness.  Musculoskeletal:        General: No edema.  Neurological: He is alert and oriented to person, place, and time.  Skin: Skin is warm and dry. No cyanosis. Nails show no clubbing.  Psychiatric: He has a normal mood and affect. His speech is normal and behavior is normal. Judgment and thought content normal.     EKG:  Was ordered and interpreted by me today. Shows NSR, 65 bpm, left axis  deviation, anterolateral TWI (unchanged)  Recent Labs: 05/07/2017: B Natriuretic Peptide 3,671.0; Magnesium 2.1 12/17/2017: ALT 29 02/21/2018: Hemoglobin 15.9; Platelets 266; TSH 0.851 03/13/2018: BUN 42; Creatinine, Ser 1.22; Potassium 3.5; Sodium 139  No results found for requested labs within last 8760 hours.   CrCl cannot be calculated (Patient's most recent lab result is older than the maximum 21 days allowed.).   Wt Readings from Last 3 Encounters:  05/02/18 128 lb (58.1 kg)  02/21/18 118 lb (53.5 kg)  12/17/17 129 lb (58.5 kg)     Other studies reviewed: Additional studies/records reviewed today include: summarized above  ASSESSMENT AND PLAN:  1. CAD involving the native coronary arteries s/p CABG without angina: Patient is not having any symptoms of chest pain though was noted to have an elevated troponin in 04/2017 peaking at 12 without ischemic evaluation performed inpatient secondary to AKI.  Given the patient's significant fatigue and shortness of breath, we will pursue Lexiscan Myoview to evaluate for high risk ischemia.  Would ideally like to avoid cardiac cath given his underlying kidney disease.  He remains on Eliquis in place of aspirin given his history of DVT.  Continue Coreg, Lipitor, and Zetia.  2. HFrEF: Volume status is significantly improved following 1 dose of metolazone on 2/26.  Baseline weight of 125 pounds which was the patient's weight this morning.  He continues to take Lasix 60 mg twice daily along with PRN metolazone 2.5 mg once per week with added KCl.  Check echo to evaluate EF given his underlying cardiomyopathy.  Continue Coreg, Imdur/hydralazine (not on ACE inhibitor/ARB/Entresto/spironolactone in the setting of underlying CKD).  3. Pulmonary hypertension: Refer to pulmonology for consideration of sleep study.  Diuresis as above.  4. Dyspena: Likely in the setting of the patient's ischemic cardiomyopathy and pulmonary hypertension.  Appears euvolemic at  this time.  Plan for noninvasive ischemic  evaluation and echo as above.  Check CBC.  5. Fatigue/somnolence/sleep disordered breathing: Patient's wife has a high suspicion for underlying sleep apnea.  Refer to pulmonology for sleep study.  Check CBC, CMP, and TSH.  6. CKD stage III: Check renal function as above.  7. Carotid artery disease: Carotid artery ultrasound from 12/2017 showed 40 to 59% left ICA stenosis with recommendation for repeat study in 2 years from original study date.  Aggressive risk factor modification including optimal BP and lipid control.  8. History of DVT: Remains on Eliquis.  No symptoms of bleeding.  Check CBC as above given persistent fatigue.  9. PAD: Patient does note some lower extremity pain though has attributed this to his lumbar pain with associated radiculopathy.  Cannot exclude progression of peripheral arterial disease.  Following the above work-up he would likely benefit from repeating lower extremity arterial ultrasound/ABI.  Disposition: F/u with Dr. [name] or an APP in 3 months.   Current medicines are reviewed at length with the patient today.  The patient did not have any concerns regarding medicines.  SignedChristell Faith, PA-C 05/02/2018 11:42 AM     [facility] [address] [place], [place] [address] [phone]

## 2018-05-02 ENCOUNTER — Encounter: Payer: Self-pay | Admitting: Physician Assistant

## 2018-05-02 ENCOUNTER — Ambulatory Visit: Payer: Medicare PPO | Admitting: Physician Assistant

## 2018-05-02 VITALS — BP 146/66 | Ht 63.0 in | Wt 128.0 lb

## 2018-05-02 DIAGNOSIS — I272 Pulmonary hypertension, unspecified: Secondary | ICD-10-CM | POA: Diagnosis not present

## 2018-05-02 DIAGNOSIS — I6522 Occlusion and stenosis of left carotid artery: Secondary | ICD-10-CM

## 2018-05-02 DIAGNOSIS — I739 Peripheral vascular disease, unspecified: Secondary | ICD-10-CM

## 2018-05-02 DIAGNOSIS — N183 Chronic kidney disease, stage 3 unspecified: Secondary | ICD-10-CM

## 2018-05-02 DIAGNOSIS — I2510 Atherosclerotic heart disease of native coronary artery without angina pectoris: Secondary | ICD-10-CM

## 2018-05-02 DIAGNOSIS — R5383 Other fatigue: Secondary | ICD-10-CM

## 2018-05-02 DIAGNOSIS — R0602 Shortness of breath: Secondary | ICD-10-CM | POA: Diagnosis not present

## 2018-05-02 DIAGNOSIS — I5042 Chronic combined systolic (congestive) and diastolic (congestive) heart failure: Secondary | ICD-10-CM

## 2018-05-02 DIAGNOSIS — G4730 Sleep apnea, unspecified: Secondary | ICD-10-CM

## 2018-05-02 DIAGNOSIS — R400 Somnolence: Secondary | ICD-10-CM | POA: Diagnosis not present

## 2018-05-02 DIAGNOSIS — I82401 Acute embolism and thrombosis of unspecified deep veins of right lower extremity: Secondary | ICD-10-CM

## 2018-05-02 NOTE — Patient Instructions (Addendum)
Medication Instructions:  Your physician recommends that you continue on your current medications as directed. Please refer to the Current Medication list given to you today.  If you need a refill on your cardiac medications before your next appointment, please call your pharmacy.   Lab work: Your physician recommends that you have lab work today: CBC, CMET, TSH   If you have labs (blood work) drawn today and your tests are completely normal, you will receive your results only by: Marland Kitchen MyChart Message (if you have MyChart) OR . A paper copy in the mail If you have any lab test that is abnormal or we need to change your treatment, we will call you to review the results.  Testing/Procedures: 1- [place]  Your caregiver has ordered a Stress Test with nuclear imaging. The purpose of this test is to evaluate the blood supply to your heart muscle. This procedure is referred to as a "Non-Invasive Stress Test." This is because other than having an IV started in your vein, nothing is inserted or "invades" your body. Cardiac stress tests are done to find areas of poor blood flow to the heart by determining the extent of coronary artery disease (CAD). Some patients exercise on a treadmill, which naturally increases the blood flow to your heart, while others who are  unable to walk on a treadmill due to physical limitations have a pharmacologic/chemical stress agent called Lexiscan . This medicine will mimic walking on a treadmill by temporarily increasing your coronary blood flow.   Please note: these test may take anywhere between 2-4 hours to complete  PLEASE REPORT TO [facility] AT THE FIRST DESK WILL DIRECT YOU WHERE TO GO  Date of Procedure:_____________________________________  Arrival Time for Procedure:______________________________  Instructions regarding medication:   __x__ : Hold Lasix the morning of procedure  __x__:  Hold betablocker(s) night  before procedure and morning of procedure (carvedilol)   ________________________________________________________________________________________________________________________________________________________________________  PLEASE NOTIFY THE OFFICE AT LEAST 24 HOURS IN ADVANCE IF YOU ARE UNABLE TO KEEP YOUR APPOINTMENT.  [phone] AND  PLEASE NOTIFY NUCLEAR MEDICINE AT [facility] AT LEAST 24 HOURS IN ADVANCE IF YOU ARE UNABLE TO KEEP YOUR APPOINTMENT. [phone]  How to prepare for your Myoview test:  1. Do not eat or drink after midnight 2. No caffeine for 24 hours prior to test 3. No smoking 24 hours prior to test. 4. Your medication may be taken with water.  If your doctor stopped a medication because of this test, do not take that medication. 5. Ladies, please do not wear dresses.  Skirts or pants are appropriate. Please wear a short sleeve shirt. 6. No perfume, cologne or lotion. 7. Wear comfortable walking shoes. No heels!      2- Echo Echo  Please return to [facility] on ______________ at _______________ AM/PM for an Echocardiogram. Your physician has requested that you have an echocardiogram. Echocardiography is a painless test that uses sound waves to create images of your heart. It provides your doctor with information about the size and shape of your heart and how well your heart's chambers and valves are working. This procedure takes approximately one hour. There are no restrictions for this procedure. Please note; depending on visual quality an IV may need to be placed.    If you have any questions after you get home, please call the office at [phone]   Follow-Up: At [facility], you and your health needs are our priority.  As part of our continuing mission to provide you with exceptional heart care, we have created designated Provider Care Teams.  These Care Teams include your primary Cardiologist (physician) and Advanced Practice Providers (APPs -   Physician Assistants and Nurse Practitioners) who all work together to provide you with the care you need, when you need it. You will need a follow up appointment in 3 months. You may see Ida Rogue, MD or Christell Faith, PA-C.   Any Other Special Instructions Will Be Listed Below (If Applicable). Referral to Pulmonology for Sleep [facility] at [place], [place] [address]  Main: [phone]  They should call you in the next week, if not, please call the number provided.

## 2018-05-03 ENCOUNTER — Telehealth: Payer: Self-pay

## 2018-05-03 DIAGNOSIS — N183 Chronic kidney disease, stage 3 unspecified: Secondary | ICD-10-CM

## 2018-05-03 LAB — COMPREHENSIVE METABOLIC PANEL
ALT: 49 IU/L — ABNORMAL HIGH (ref 0–44)
AST: 33 IU/L (ref 0–40)
Albumin/Globulin Ratio: 2.2 (ref 1.2–2.2)
Albumin: 4.6 g/dL (ref 3.6–4.6)
Alkaline Phosphatase: 74 IU/L (ref 39–117)
BUN/Creatinine Ratio: 35 — ABNORMAL HIGH (ref 10–24)
BUN: 48 mg/dL — AB (ref 8–27)
Bilirubin Total: 0.9 mg/dL (ref 0.0–1.2)
CO2: 27 mmol/L (ref 20–29)
Calcium: 9.8 mg/dL (ref 8.6–10.2)
Chloride: 94 mmol/L — ABNORMAL LOW (ref 96–106)
Creatinine, Ser: 1.37 mg/dL — ABNORMAL HIGH (ref 0.76–1.27)
GFR calc Af Amer: 53 mL/min/{1.73_m2} — ABNORMAL LOW (ref >59)
GFR calc non Af Amer: 46 mL/min/{1.73_m2} — ABNORMAL LOW (ref >59)
Globulin, Total: 2.1 g/dL (ref 1.5–4.5)
Glucose: 137 mg/dL — ABNORMAL HIGH (ref 65–99)
Potassium: 3.5 mmol/L (ref 3.5–5.2)
Sodium: 142 mmol/L (ref 134–144)
Total Protein: 6.7 g/dL (ref 6.0–8.5)

## 2018-05-03 LAB — CBC
Hematocrit: 47.6 % (ref 37.5–51.0)
Hemoglobin: 15.5 g/dL (ref 13.0–17.7)
MCH: 28.7 pg (ref 26.6–33.0)
MCHC: 32.6 g/dL (ref 31.5–35.7)
MCV: 88 fL (ref 79–97)
Platelets: 160 10*3/uL (ref 150–450)
RBC: 5.41 x10E6/uL (ref 4.14–5.80)
RDW: 15.1 % (ref 11.6–15.4)
WBC: 8.5 10*3/uL (ref 3.4–10.8)

## 2018-05-03 LAB — TSH: TSH: 1.43 u[IU]/mL (ref 0.450–4.500)

## 2018-05-03 NOTE — Telephone Encounter (Signed)
Call to patient to review labs and POC from provider. Pt verbalized understanding. He requested I send him the advice in writing. Pt message sent.   Advised pt to call for any further questions or concerns. Pt agrees with POC>

## 2018-05-03 NOTE — Telephone Encounter (Signed)
-----   Message from [name], [place] sent at 05/03/2018 12:21 PM EST ----- Labs showed slightly elevated though relatively stable renal function with a potassium of 3.5. Random glucose mildly elevated. 1 liver function test was minimally elevated. Blood count was normal. Thyroid function was normal.  No changes to standing Lasix therapy of 60 mg twice daily. I would like for the patient to take KCl 10 mEq twice daily rather than daily given his mild hypokalemia. Would continue to only take metolazone as needed and no greater than once per week.  When he takes metolazone, he will need to take added KCl like he is doing without change. With the above slight increase in KCl, please recheck BMP in 1 week. We will need to follow-up the mildly elevated isolated liver function test in a couple of months.

## 2018-05-03 NOTE — Telephone Encounter (Signed)
Rec'd from [place] forwarded 10 pages to GI Historical Provider

## 2018-05-06 NOTE — Addendum Note (Signed)
Addended by: Britt Bottom on: 05/06/2018 03:42 PM   Modules accepted: Orders

## 2018-05-07 MED ORDER — APIXABAN 2.5 MG PO TABS: 2.5000 mg | ORAL_TABLET | Freq: Two times a day (BID) | ORAL | 3 refills | Status: DC

## 2018-05-09 ENCOUNTER — Other Ambulatory Visit
Admission: RE | Admit: 2018-05-09 | Discharge: 2018-05-09 | Disposition: A | Discharge status: Home / Self Care | Payer: Medicare PPO | Source: Ambulatory Visit | Attending: Physician Assistant | Admitting: Physician Assistant

## 2018-05-09 DIAGNOSIS — N183 Chronic kidney disease, stage 3 unspecified: Secondary | ICD-10-CM

## 2018-05-09 LAB — BASIC METABOLIC PANEL
Anion gap: 9 (ref 5–15)
BUN: 46 mg/dL — ABNORMAL HIGH (ref 8–23)
CO2: 32 mmol/L (ref 22–32)
Calcium: 9.5 mg/dL (ref 8.9–10.3)
Chloride: 99 mmol/L (ref 98–111)
Creatinine, Ser: 1.29 mg/dL — ABNORMAL HIGH (ref 0.61–1.24)
GFR calc non Af Amer: 50 mL/min — ABNORMAL LOW (ref >60)
GFR, EST AFRICAN AMERICAN: 57 mL/min — AB (ref >60)
Glucose, Bld: 155 mg/dL — ABNORMAL HIGH (ref 70–99)
Potassium: 3.9 mmol/L (ref 3.5–5.1)
SODIUM: 140 mmol/L (ref 135–145)

## 2018-05-13 MED ORDER — TRAMADOL HCL 50 MG PO TABS: 100.0000 mg | ORAL_TABLET | Freq: Three times a day (TID) | ORAL | 0 refills | Status: DC | PRN

## 2018-05-13 NOTE — Progress Notes (Signed)
[facility]     Assessment and Plan:  Dyspnea on exertion which is multifactorial from deconditioning, CKD, systolic CHF, pleural effusion, vocal cord paralysis. --Continue diuresis.  --Continue to follow up with ENT, cardiology, nephrology.   Pleural effusion. - Patient has a moderate right-sided pleural effusion, this is likely secondary to systolic congestive heart failure.  Currently the patient does not appear to be very symptomatic from this. --Discussed that if he develops respiratory failure can drain the fluid if needed, however the fluid would likely come back due to underlying heart and renal failure.   Pulmonary hypertension. -The patient currently has fairly good exercise tolerance and no evidence of desaturation on exercise at this time (grade 1 to grade 2 dyspnea).  Therefore I do not think he would get much or any benefit from treatment of pulmonary hypertension.   Systolic congestive heart failure - EF 30%.  Given presence of a heart failure patient would not be candidate for home sleep test.  Excessive daytime sleepiness. - Symptoms and signs of obstructive sleep apnea. -We will send for sleep study.  Orders Placed This Encounter  Procedures  . Split night study   Return in about 3 months (around 08/14/2018).  If patient is not started on CPAP can return in 6 months.    Date: 05/13/2018  MRN# [id number] Dennis Burns Jul 12, 1928    Dennis Burns is a 85 y.o. old male seen in consultation for chief complaint of:    Chief Complaint  Patient presents with  . Follow-up    wants a sleep study  . Sleep Apnea    gets up every 1.5 hours    HPI:   Dennis Burns is a pleasant 85 year old gentleman with history of peripheral arterial disease, coronary artery disease status post CABG x4 in 2003, chronic renal insufficiency with atrophic kidney, DVT of the right lower extremity in 2012.  He also has a paralyzed left vocal cord with a history of  dysphagia.  Last visit he was seen due to right-sided pleural effusion, which was thought to be secondary to volume overload.  We did not consider drainage, given his advanced age and lack of related respiratory symptoms.   Currently he notes that he has been feeling ok. He notes that his stamina is limited by back and leg pain. He notes that when he wakes in am he feels tired and that he did not get a good night sleep.   He goes to bed at 10 pm after taking melatonin 30 min prior, falls asleep quickly. He wakes up about 3 times. He wakes at 7 am to take his meds, then he sleeps further until 9 am or later. He remains sleepy during the day until the afternoon.  He has not been tested for OSA, but does have witnessed apneas. Denies sleep paralysis, no sleep walking, no cataplexy. Denies jaw pain, no dentures, no TMJ.   Review of testing: **Desat walk 09/05/17 on RA at rest; 98% at RA and HR 63. Pt walked 720 feet sat 98% and HR 84; Minimal dyspnea.  **CT head neck 08/21/2017>> imaging personally reviewed small paratracheal and subcarinal lymphadenopathy.  Enlarged pulmonary artery suggestive of pulmonary arterial hypertension, small to moderate right free-flowing pleural effusion seen in the upper half of the lungs included in this CT head and neck. **Echocardiogram 05/01/2017>> EF is 35%, moderate to severe mitral regurgitation, reduced systolic function of the right ventricle, pulmonary artery systolic hypertension peak pressure 70 to 75 m of  mercury. **Right heart catheterization 02/14/17>> mildly elevated filling pressures, moderate pulmonary hypertension and normal cardiac output. Pulmonary wedge pressure was 16 mmHg, PA pressure was 55 over 13 mmHg and cardiac output was 5.84 with a cardiac index of 3.5. **Chest x-ray 05/06/2017>> images personally reviewed, there is mild interstitial edema in the bases, likely consistent with pulmonary edema.  Medication:    Current Outpatient Medications:  .   Alpha-Lipoic Acid 200 MG CAPS, Take 1 capsule by mouth daily., Disp: , Rfl:  .  apixaban (ELIQUIS) 2.5 MG TABS tablet, Take 1 tablet (2.5 mg total) by mouth 2 (two) times daily., Disp: 180 tablet, Rfl: 3 .  atorvastatin (LIPITOR) 40 MG tablet, Take 1 tablet (40 mg total) by mouth daily., Disp: 90 tablet, Rfl: 3 .  CALCIUM CITRATE PO, Take 1 tablet by mouth daily. Takes 300mg  daily., Disp: , Rfl:  .  carvedilol (COREG) 12.5 MG tablet, Take 0.5 tablets (6.25 mg total) by mouth 2 (two) times daily with a meal., Disp: 90 tablet, Rfl: 3 .  Cholecalciferol (VITAMIN D) 2000 units CAPS, Take 4,000 Units by mouth daily., Disp: , Rfl:  .  Coenzyme Q10 (CO Q10) 200 MG CAPS, Take 1 capsule by mouth daily., Disp: , Rfl:  .  CRANBERRY EXTRACT PO, Take 1 tablet by mouth daily. Takes 1500mg  daily, Disp: , Rfl:  .  ezetimibe (ZETIA) 10 MG tablet, TAKE 1 TABLET EVERY DAY, Disp: 90 tablet, Rfl: 4 .  feeding supplement, ENSURE ENLIVE, (ENSURE ENLIVE) LIQD, Take 237 mLs by mouth 2 (two) times daily between meals., Disp: 237 mL, Rfl: 12 .  Ferrous Gluconate (IRON 27 PO), Take by mouth daily., Disp: , Rfl:  .  furosemide (LASIX) 40 MG tablet, Take 1.5 tablets (60 mg total) by mouth 2 (two) times daily., Disp: 270 tablet, Rfl: 3 .  hydrALAZINE (APRESOLINE) 10 MG tablet, TAKE 1 TABLET THREE TIMES DAILY, Disp: 270 tablet, Rfl: 3 .  isosorbide mononitrate (IMDUR) 60 MG 24 hr tablet, Take 1 tablet (60 mg total) by mouth daily., Disp: 90 tablet, Rfl: 3 .  latanoprost (XALATAN) 0.005 % ophthalmic solution, Place 1 drop into both eyes at bedtime. In morning, Disp: , Rfl:  .  levothyroxine (SYNTHROID, LEVOTHROID) 75 MCG tablet, Take 1 tablet (75 mcg total) by mouth daily., Disp: 90 tablet, Rfl: 3 .  Multiple Vitamin (MULTIVITAMIN) tablet, Take 1 tablet by mouth daily., Disp: , Rfl:  .  nitroGLYCERIN (NITROSTAT) 0.4 MG SL tablet, Place 1 tablet (0.4 mg total) under the tongue every 5 (five) minutes as needed for chest pain., Disp:  , Rfl: 12 .  Omega 3-6-9 Fatty Acids (OMEGA-3-6-9 PO), Take 1 capsule by mouth 2 (two) times daily., Disp: , Rfl:  .  potassium chloride (K-DUR) 10 MEQ tablet, Take 1 tablet (10 mEq total) by mouth as directed. Take 3 times daily and 4 pills when taking metolazone, Disp: 360 tablet, Rfl: 3 .  predniSONE (DELTASONE) 10 MG tablet, Take 4 tablets once a day for 2 days, then 3 tablets x 2 days, 2 tabs x 2 days and 1 tab x 2 days, Disp: 20 tablet, Rfl: 0 .  timolol (BETIMOL) 0.5 % ophthalmic solution, Place 1 drop into both eyes 2 (two) times daily., Disp: , Rfl:  .  traMADol (ULTRAM) 50 MG tablet, Take 2 tablets (100 mg total) by mouth 3 (three) times daily as needed., Disp: 540 tablet, Rfl: 0   Allergies:  Patient has no known allergies.  Review of Systems:  Constitutional: Feels well. Cardiovascular: Denies chest pain, exertional chest pain.  Pulmonary: Denies hemoptysis, pleuritic chest pain.   The remainder of systems were reviewed and were found to be negative other than what is documented in the HPI.    Physical Examination:   VS: BP 138/70 (BP Location: Left Arm, Cuff Size: Normal)   Pulse 72   Ht 5\' 3"  (1.6 m)   Wt 129 lb 6.4 oz (58.7 kg)   SpO2 96%   BMI 22.92 kg/m   General Appearance: No distress  Neuro:without focal findings, mental status, speech normal, alert and oriented HEENT: PERRLA, EOM intact Pulmonary: No wheezing, No rales  CardiovascularNormal S1,S2.  No m/r/g.  Abdomen: Benign, Soft, non-tender, No masses Renal:  No costovertebral tenderness  GU:  No performed at this time. Endoc: No evident thyromegaly, no signs of acromegaly or Cushing features Skin:   warm, no rashes, no ecchymosis  Extremities: normal, no cyanosis, clubbing.      LABORATORY PANEL:   CBC No results for input(s): WBC, HGB, HCT, PLT in the last 168 hours. ------------------------------------------------------------------------------------------------------------------  Chemistries    Recent Labs  Lab 05/09/18 1155  NA 140  K 3.9  CL 99  CO2 32  GLUCOSE 155*  BUN 46*  CREATININE 1.29*  CALCIUM 9.5   ------------------------------------------------------------------------------------------------------------------  Cardiac Enzymes No results for input(s): TROPONINI in the last 168 hours. ------------------------------------------------------------  RADIOLOGY:  No results found.     Thank  you for the consultation and for allowing [facility] to assist in the care of your patient. Our recommendations are noted above.  Please contact us if we can be of further service.   Marda Stalker, M.D., F.C.C.P.  Board Certified in Internal Medicine, Pulmonary Medicine, [place], and Sleep Medicine.  [facility] Office Number: [phone]   05/13/2018

## 2018-05-14 ENCOUNTER — Other Ambulatory Visit: Payer: Self-pay

## 2018-05-14 ENCOUNTER — Encounter: Payer: Self-pay | Admitting: Internal Medicine

## 2018-05-14 ENCOUNTER — Ambulatory Visit: Payer: Medicare PPO | Admitting: Internal Medicine

## 2018-05-14 VITALS — BP 138/70 | HR 72 | Ht 63.0 in | Wt 129.4 lb

## 2018-05-14 DIAGNOSIS — R0600 Dyspnea, unspecified: Secondary | ICD-10-CM

## 2018-05-14 DIAGNOSIS — I270 Primary pulmonary hypertension: Secondary | ICD-10-CM | POA: Diagnosis not present

## 2018-05-14 DIAGNOSIS — R0609 Other forms of dyspnea: Secondary | ICD-10-CM

## 2018-05-14 DIAGNOSIS — G4719 Other hypersomnia: Secondary | ICD-10-CM | POA: Diagnosis not present

## 2018-05-14 NOTE — Patient Instructions (Signed)

## 2018-05-20 DIAGNOSIS — H401133 Primary open-angle glaucoma, bilateral, severe stage: Secondary | ICD-10-CM | POA: Diagnosis not present

## 2018-05-20 DIAGNOSIS — H2513 Age-related nuclear cataract, bilateral: Secondary | ICD-10-CM | POA: Diagnosis not present

## 2018-05-22 ENCOUNTER — Other Ambulatory Visit: Payer: Self-pay

## 2018-05-22 ENCOUNTER — Ambulatory Visit
Admission: RE | Admit: 2018-05-22 | Discharge: 2018-05-22 | Disposition: A | Discharge status: Home / Self Care | Payer: Medicare PPO | Source: Ambulatory Visit | Attending: Physician Assistant | Admitting: Physician Assistant

## 2018-05-22 DIAGNOSIS — R0602 Shortness of breath: Secondary | ICD-10-CM

## 2018-05-22 LAB — NM MYOCAR MULTI W/SPECT W/WALL MOTION / EF
CHL CUP RESTING HR STRESS: 76 {beats}/min
LV dias vol: 169 mL (ref 62–150)
LV sys vol: 132 mL
Peak HR: 85 {beats}/min
Percent HR: 63 %
TID: 1.1

## 2018-05-22 MED ORDER — TECHNETIUM TC 99M TETROFOSMIN IV KIT
28.9770 | PACK | Freq: Once | INTRAVENOUS | Status: AC | PRN
  Administered 2018-05-22: 28.977 via INTRAVENOUS

## 2018-05-22 MED ORDER — TECHNETIUM TC 99M TETROFOSMIN IV KIT
10.9900 | PACK | Freq: Once | INTRAVENOUS | Status: AC | PRN
  Administered 2018-05-22: 10.99 via INTRAVENOUS

## 2018-05-22 MED ORDER — REGADENOSON 0.4 MG/5ML IV SOLN
0.4000 mg | Freq: Once | INTRAVENOUS | Status: AC
  Administered 2018-05-22: 0.4 mg via INTRAVENOUS

## 2018-05-27 ENCOUNTER — Telehealth: Payer: Self-pay | Admitting: Internal Medicine

## 2018-05-27 NOTE — Telephone Encounter (Signed)
DoD 2.27.20 Dr. Henrene Pastor, pt's last colonoscopy was March 2017 at [facility]--due for 3-year recall due to hx of polyps.  Pt requested a local GI doctor.  Records from previous colonoscopy will be placed on your desk for review.

## 2018-05-27 NOTE — Telephone Encounter (Signed)
Multiple outside records reviewed.  Multiple prior colonoscopies.  Based on most recent colonoscopy findings and current age, no surveillance colonoscopy is recommended

## 2018-05-31 ENCOUNTER — Other Ambulatory Visit: Payer: Medicare PPO

## 2018-06-04 NOTE — Telephone Encounter (Signed)
Patient and his wife has been notified and aware. They are both adamant about having a repeat colonoscopy completed. They are requesting a office visit to discuss after the COVID 19 guidelines have been lifted. Recall for 09-2018 placed for office visit.

## 2018-06-24 MED ORDER — ISOSORBIDE MONONITRATE ER 60 MG PO TB24: 60.0000 mg | ORAL_TABLET | Freq: Every day | ORAL | 3 refills | Status: DC

## 2018-07-16 ENCOUNTER — Other Ambulatory Visit: Payer: Self-pay

## 2018-07-16 ENCOUNTER — Ambulatory Visit (INDEPENDENT_AMBULATORY_CARE_PROVIDER_SITE_OTHER): Payer: Medicare PPO

## 2018-07-16 DIAGNOSIS — R0602 Shortness of breath: Secondary | ICD-10-CM | POA: Diagnosis not present

## 2018-07-16 NOTE — Progress Notes (Unsigned)
   Patient ID: Dennis Burns, male    DOB: 1928-04-20, 85 y.o.   MRN: [id number]  Echocardiogram performed today. Dr [name] informed of preliminary evidence of reduced LVEF, pulmonary hypertension and aortic stenosis, given patient's complaint of severe and progressive fatigue. Dr. [name] briefly reviewed images and spoke with patient     Review of Systems    Physical Exam

## 2018-07-17 ENCOUNTER — Telehealth: Payer: Self-pay

## 2018-07-17 NOTE — Telephone Encounter (Signed)
VERBAL CONSENT OBTAINED.   YOUR CARDIOLOGY TEAM HAS ARRANGED FOR AN E-VISIT FOR YOUR APPOINTMENT - PLEASE REVIEW IMPORTANT INFORMATION BELOW SEVERAL DAYS PRIOR TO YOUR APPOINTMENT  Due to the recent COVID-19 pandemic, we are transitioning in-person office visits to tele-medicine visits in an effort to decrease unnecessary exposure to our patients, their families, and staff. These visits are billed to your insurance just like a normal visit is. We also encourage you to sign up for MyChart if you have not already done so. You will need a smartphone if possible. For patients that do not have this, we can still complete the visit using a regular telephone but do prefer a smartphone to enable video when possible. You may have a family member that lives with you that can help. If possible, we also ask that you have a blood pressure cuff and scale at home to measure your blood pressure, heart rate and weight prior to your scheduled appointment. Patients with clinical needs that need an in-person evaluation and testing will still be able to come to the office if absolutely necessary. If you have any questions, feel free to call our office.      . IF USING DOXIMITY or [email or web address] - The staff will give you instructions on receiving your link to join the meeting the day of your visit.      2-3 DAYS BEFORE YOUR APPOINTMENT  You will receive a telephone call from one of our [place] team members - your caller ID may say "Unknown caller." If this is a video visit, we will walk you through how to get the video launched on your phone. We will remind you check your blood pressure, heart rate and weight prior to your scheduled appointment. If you have an Apple Watch or Kardia, please upload any pertinent ECG strips the day before or morning of your appointment to [facility]. Our staff will also make sure you have reviewed the consent and agree to move forward with your scheduled tele-health visit.     THE DAY OF YOUR  APPOINTMENT  Approximately 15 minutes prior to your scheduled appointment, you will receive a telephone call from one of [place] team - your caller ID may say "Unknown caller."  Our staff will confirm medications, vital signs for the day and any symptoms you may be experiencing. Please have this information available prior to the time of visit start. It may also be helpful for you to have a pad of paper and pen handy for any instructions given during your visit. They will also walk you through joining the smartphone meeting if this is a video visit.    CONSENT FOR TELE-HEALTH VISIT - PLEASE REVIEW  I hereby voluntarily request, consent and authorize [facility] and its employed or contracted physicians, physician assistants, nurse practitioners or other licensed health care professionals (the Practitioner), to provide me with telemedicine health care services (the "Services") as deemed necessary by the treating Practitioner. I acknowledge and consent to receive the Services by the Practitioner via telemedicine. I understand that the telemedicine visit will involve communicating with the Practitioner through live audiovisual communication technology and the disclosure of certain medical information by electronic transmission. I acknowledge that I have been given the opportunity to request an in-person assessment or other available alternative prior to the telemedicine visit and am voluntarily participating in the telemedicine visit.  I understand that I have the right to withhold or withdraw my consent to the use of telemedicine in the course of my  care at any time, without affecting my right to future care or treatment, and that the Practitioner or I may terminate the telemedicine visit at any time. I understand that I have the right to inspect all information obtained and/or recorded in the course of the telemedicine visit and may receive copies of available information for a reasonable fee.  I  understand that some of the potential risks of receiving the Services via telemedicine include:  [place] Kitchen Delay or interruption in medical evaluation due to technological equipment failure or disruption; . Information transmitted may not be sufficient (e.g. poor resolution of images) to allow for appropriate medical decision making by the Practitioner; and/or  . In rare instances, security protocols could fail, causing a breach of personal health information.  Furthermore, I acknowledge that it is my responsibility to provide information about my medical history, conditions and care that is complete and accurate to the best of my ability. I acknowledge that Practitioner's advice, recommendations, and/or decision may be based on factors not within their control, such as incomplete or inaccurate data provided by me or distortions of diagnostic images or specimens that may result from electronic transmissions. I understand that the practice of medicine is not an exact science and that Practitioner makes no warranties or guarantees regarding treatment outcomes. I acknowledge that I will receive a copy of this consent concurrently upon execution via email to the email address I last provided but may also request a printed copy by calling the office of [facility].    I understand that my insurance will be billed for this visit.   I have read or had this consent read to me. . I understand the contents of this consent, which adequately explains the benefits and risks of the Services being provided via telemedicine.  . I have been provided ample opportunity to ask questions regarding this consent and the Services and have had my questions answered to my satisfaction. . I give my informed consent for the services to be provided through the use of telemedicine in my medical care  By participating in this telemedicine visit I agree to the above.

## 2018-07-17 NOTE — Telephone Encounter (Signed)
Patient made aware of echo results with verbalized understanding. His weights and swelling are stable. Pt denies sob. He is still having fatigue that he feels is related to undiagnosed sleep apnea. Pt sleep study was cancelled due to [place]. He is anxious to have it performed with the hopes of being diagnosed and treated. He ask that a message be sent to pulmonology to prioritize his sleep study once testing has resumed. Virtual visit sch with Dr. [name] on 07/22/18 @ 11:40am. See tel enc for more info.  Pt is agreeable with having a virtual visit. appt scheduled with Dr. [name] on 5/18 @11 :40am. Pt does not have a smartphone. He does have access to a tablet and desktop. Verbal consent obtained. Adv the pt to have his weight and BP/HR available prior to the appt. Adv the pt that he will be contacted prior to the appt.To be given instruction and to make sure we are able to connect. Pt verbalized understanding and voiced appreciation.

## 2018-07-17 NOTE — Telephone Encounter (Signed)
-----   Message from [name], PA-C sent at 07/17/2018  7:24 AM EDT ----- Echo showed an EF of 25-30%, mild stiffening of the heart, reduced right side pump function of the heart, mildly enlarged right and left atria, degenerative mitral valve, moderately leaky tricuspid valve, mild to moderately leaky aortic valve, at least moderate aortic stenosis, mildly dilated pulmonary artery.   Compared to prior echo, his pump function is a little worse and the aortic valve may be a little more narrowed as well. It does appear he may be holding on to some fluid as well. How has he been feeling? If possible can we get him set up for a virtual visit with someone next week? He may benefit from seeing the advanced heart failure team.

## 2018-07-21 NOTE — Progress Notes (Signed)
Virtual Visit via Telephone Note   This visit type was conducted due to national recommendations for restrictions regarding the COVID-19 Pandemic (e.g. social distancing) in an effort to limit this patient's exposure and mitigate transmission in our community.  Due to his co-morbid illnesses, this patient is at least at moderate risk for complications without adequate follow up.  This format is felt to be most appropriate for this patient at this time.  The patient did not have access to video technology/had technical difficulties with video requiring transitioning to audio format only (telephone).  All issues noted in this document were discussed and addressed.  No physical exam could be performed with this format.  Please refer to the patient's chart for his  consent to telehealth for [facility].   I connected with  Dennis Burns on 07/22/18 by a video enabled telemedicine application and verified that I am speaking with the correct person using two identifiers. I discussed the limitations of evaluation and management by telemedicine. The patient expressed understanding and agreed to proceed.   Evaluation Performed:  Follow-up visit  Date:  07/22/2018   ID:  Dennis Burns, DOB 1928/07/24, MRN [id number]  Patient Location:  [place] [address]   Provider location:   [facility], [place] office  PCP:  [name], MD  Cardiologist:  [facility]   Chief Complaint:      History of Present Illness:    Dennis Burns is a 85 y.o. male who presents via audio/video conferencing for a telehealth visit today.   The patient does not symptoms concerning for COVID-19 infection (fever, chills, cough, or new SHORTNESS OF BREATH).   Patient has a past medical history of Chronic shortness of breath  mild aortic valve stenosis/moderate MR/moderate pulmonary hypertension,  PAD, s/p CEA on the right,  Duplex showed significant bilateral common  iliac artery stenosis as well as severe left SFA stenosis. Hyperlipidemia,  hypertension,  coronary artery disease, CABG x 4 in 2003,  Chronic renal insufficiency,  atrophic kidney,  living off one kidney, seen by nephrology,Dr. Candiss Norse chronic back pain, sciatica, spinal stenosis, back surgery 2  prostate cancer, radioactive seeds  DVT in right leg noted before 2012 surgery, on xarelto echocardiogram October 2017 showing mild to moderate aortic valve stenosis, moderate MR, moderately elevated right heart pressures, ejection fraction 55% or greater prior smoking history less than 10 years when he was younger  who presents for follow-up of his coronary disease and PADchronic shortness of breath  Wife continues to indicate the patient snores and has apneic episodes during the night.   home goal weight has been felt to be 125 pounds.  Weight currently 121 ,  No edema Legs feeling weaker, not walking as much  Blood pressure running higher, 621 to 308 systolic  Carotid artery ultrasound from 12/2017 showed 1-39% RICA stenosis, 65-78% LICA stenosis, stenotic left subclavian artery with disturbance of flow in the right subclavian artery.  Echo 07/16/2018 1. The left ventricle has severely reduced systolic function, with an ejection fraction of 25-30%.   2. The right ventricle has severely reduced systolic function. The cavity was moderately enlarged.   3. Left atrial size was mildly dilated.  4. Right atrial size was mildly dilated.  moderate aortic stenosis with a mean gradient of 20 mmHg and estimated valve area of 0.6-0.7 cm^2  Echo : 04/2017: EF 35 to 40%  Last cath 02/2017, severe diffuse disease Echo  01/24/2017:  EF 55 to 60%  Recent lab work reviewed creatinine 1.29 BUN 46  Other past medical history reviewed Previous Long hospitalization for acute on chronic respiratory distress On and off bipap, aggressive diuresis , severe pulm HTN, CRF Mod to severe MR,  mod TR, EF 35%  milrinone during this admission  Moderate aortic stenosis with moderate aortic insufficiency D/c on lasix 40 BID  Cardiac cath 02/14/217 Severe underlying heavily calcified three-vessel coronary artery disease occluded LAD,  ostial right coronary artery and severe ostial left circumflex disease.  Patent LIMA to LAD and SVG to right coronary artery. The SVG to OM is possibly occluded, not visible on asending aortogram.  graft might be open given that there is some mild competitive flow in the distal left circumflex. -- left subclavian tortuosity and heavy calcifications throughout.  2. Right heart catheterization showed only mildly elevated filling pressures, moderate pulmonary hypertension and normal cardiac output. Pulmonary wedge pressure was 16 mmHg, PA pressure was 55 over 13 mmHg and cardiac output was 5.84 with a cardiac index of 3.5.  3. Left ventricular angiography was not performed due to chronic kidney disease.  --The native left circumflex is too high risk for PCI given involvement of left main, ostial location and heavy calcifications. treat medically  --moderate pulmonary hypertension    Prior CV studies:   The following studies were reviewed today:    Past Medical History:  Diagnosis Date  . Allergic rhinitis due to pollen    as child--better now  . Chronic kidney disease, stage III (moderate) (HCC)   . Coronary artery disease   . DVT, lower extremity, recurrent ([place])   . Glaucoma    [facility]   . History of prostate cancer 2004  . Hyperlipidemia   . Hypertension   . Hypothyroidism   . Impaired fasting glucose   . Prostate cancer ([place]) 2004   Rad seed implants  . Spinal stenosis of lumbar region with radiculopathy    Past Surgical History:  Procedure Laterality Date  . CAROTID ENDARTERECTOMY Right 09/2004   [place] GRAFT  2003   [place]   after fall  . INSERTION PROSTATE  RADIATION SEED  2004   and RT  . POSTERIOR LUMBAR FUSION  2012, 2014  . RIGHT/LEFT HEART CATH AND CORONARY ANGIOGRAPHY N/A 02/14/2017   Procedure: RIGHT/LEFT HEART CATH AND CORONARY ANGIOGRAPHY;  Surgeon: Wellington Hampshire, MD;  Location: [facility];  Service: Cardiovascular;  Laterality: N/A;     Current Meds  Medication Sig  . Alpha-Lipoic Acid 200 MG CAPS Take 1 capsule by mouth daily.  Marland Kitchen apixaban (ELIQUIS) 2.5 MG TABS tablet Take 1 tablet (2.5 mg total) by mouth 2 (two) times daily.  Marland Kitchen atorvastatin (LIPITOR) 40 MG tablet Take 1 tablet (40 mg total) by mouth daily.  Marland Kitchen CALCIUM CITRATE PO Take 1 tablet by mouth daily. Takes 300mg  daily.  . carvedilol (COREG) 12.5 MG tablet Take 0.5 tablets (6.25 mg total) by mouth 2 (two) times daily with a meal.  . Cholecalciferol (VITAMIN D) 2000 units CAPS Take 4,000 Units by mouth daily.  . Coenzyme Q10 (CO Q10) 200 MG CAPS Take 1 capsule by mouth daily.  Marland Kitchen CRANBERRY EXTRACT PO Take 1 tablet by mouth daily. Takes 1500mg  daily  . ezetimibe (ZETIA) 10 MG tablet TAKE 1 TABLET EVERY DAY  . feeding supplement, ENSURE ENLIVE, (ENSURE ENLIVE) LIQD Take 237 mLs by  mouth 2 (two) times daily between meals.  . Ferrous Gluconate (IRON 27 PO) Take by mouth daily.  . furosemide (LASIX) 40 MG tablet Take 1.5 tablets (60 mg total) by mouth 2 (two) times daily.  . hydrALAZINE (APRESOLINE) 10 MG tablet TAKE 1 TABLET THREE TIMES DAILY  . isosorbide mononitrate (IMDUR) 60 MG 24 hr tablet Take 1 tablet (60 mg total) by mouth daily.  Marland Kitchen latanoprost (XALATAN) 0.005 % ophthalmic solution Place 1 drop into both eyes at bedtime. In morning  . levothyroxine (SYNTHROID, LEVOTHROID) 75 MCG tablet Take 1 tablet (75 mcg total) by mouth daily.  . Multiple Vitamin (MULTIVITAMIN) tablet Take 1 tablet by mouth daily.  . nitroGLYCERIN (NITROSTAT) 0.4 MG SL tablet Place 1 tablet (0.4 mg total) under the tongue every 5 (five) minutes as needed for chest pain.  [name] 3-6-9 Fatty  Acids (OMEGA-3-6-9 PO) Take 1 capsule by mouth 2 (two) times daily.  . potassium chloride (K-DUR) 10 MEQ tablet Take 1 tablet (10 mEq total) by mouth as directed. Take 3 times daily and 4 pills when taking metolazone (Patient taking differently: Take 10 mEq by mouth 2 (two) times daily. Take 3 times daily and 4 pills when taking metolazone)  . timolol (BETIMOL) 0.5 % ophthalmic solution Place 1 drop into both eyes 2 (two) times daily.  . traMADol (ULTRAM) 50 MG tablet Take 2 tablets (100 mg total) by mouth 3 (three) times daily as needed.     Allergies:   Patient has no known allergies.   Social History   Tobacco Use  . Smoking status: Former Smoker    Packs/day: 0.25    Years: 18.00    Pack years: 4.50    Last attempt to quit: 1978    Years since quitting: 42.4  . Smokeless tobacco: Never Used  . Tobacco comment: Quit in 1978  Substance Use Topics  . Alcohol use: Yes  . Drug use: No     Current Outpatient Medications on File Prior to Visit  Medication Sig Dispense Refill  . Alpha-Lipoic Acid 200 MG CAPS Take 1 capsule by mouth daily.    Marland Kitchen apixaban (ELIQUIS) 2.5 MG TABS tablet Take 1 tablet (2.5 mg total) by mouth 2 (two) times daily. 180 tablet 3  . atorvastatin (LIPITOR) 40 MG tablet Take 1 tablet (40 mg total) by mouth daily. 90 tablet 3  . CALCIUM CITRATE PO Take 1 tablet by mouth daily. Takes 300mg  daily.    . carvedilol (COREG) 12.5 MG tablet Take 0.5 tablets (6.25 mg total) by mouth 2 (two) times daily with a meal. 90 tablet 3  . Cholecalciferol (VITAMIN D) 2000 units CAPS Take 4,000 Units by mouth daily.    . Coenzyme Q10 (CO Q10) 200 MG CAPS Take 1 capsule by mouth daily.    Marland Kitchen CRANBERRY EXTRACT PO Take 1 tablet by mouth daily. Takes 1500mg  daily    . ezetimibe (ZETIA) 10 MG tablet TAKE 1 TABLET EVERY DAY 90 tablet 4  . feeding supplement, ENSURE ENLIVE, (ENSURE ENLIVE) LIQD Take 237 mLs by mouth 2 (two) times daily between meals. 237 mL 12  . Ferrous Gluconate (IRON 27  PO) Take by mouth daily.    . furosemide (LASIX) 40 MG tablet Take 1.5 tablets (60 mg total) by mouth 2 (two) times daily. 270 tablet 3  . hydrALAZINE (APRESOLINE) 10 MG tablet TAKE 1 TABLET THREE TIMES DAILY 270 tablet 3  . isosorbide mononitrate (IMDUR) 60 MG 24 hr tablet Take 1 tablet (  60 mg total) by mouth daily. 90 tablet 3  . latanoprost (XALATAN) 0.005 % ophthalmic solution Place 1 drop into both eyes at bedtime. In morning    . levothyroxine (SYNTHROID, LEVOTHROID) 75 MCG tablet Take 1 tablet (75 mcg total) by mouth daily. 90 tablet 3  . Multiple Vitamin (MULTIVITAMIN) tablet Take 1 tablet by mouth daily.    . nitroGLYCERIN (NITROSTAT) 0.4 MG SL tablet Place 1 tablet (0.4 mg total) under the tongue every 5 (five) minutes as needed for chest pain.  12  . Omega 3-6-9 Fatty Acids (OMEGA-3-6-9 PO) Take 1 capsule by mouth 2 (two) times daily.    . potassium chloride (K-DUR) 10 MEQ tablet Take 1 tablet (10 mEq total) by mouth as directed. Take 3 times daily and 4 pills when taking metolazone (Patient taking differently: Take 10 mEq by mouth 2 (two) times daily. Take 3 times daily and 4 pills when taking metolazone) 360 tablet 3  . timolol (BETIMOL) 0.5 % ophthalmic solution Place 1 drop into both eyes 2 (two) times daily.    . traMADol (ULTRAM) 50 MG tablet Take 2 tablets (100 mg total) by mouth 3 (three) times daily as needed. 540 tablet 0   No current facility-administered medications on file prior to visit.      Family Hx: The patient's family history includes Cancer in his father and mother; Glaucoma in his mother; Stroke in his maternal grandfather. There is no history of Diabetes.  ROS:   Please see the history of present illness.    Review of Systems  Constitutional: Positive for weight loss.  Respiratory: Positive for shortness of breath.   Cardiovascular: Negative.   Gastrointestinal: Negative.   Musculoskeletal: Negative.   Neurological: Negative.   Psychiatric/Behavioral:  Negative.   All other systems reviewed and are negative.     Labs/Other Tests and Data Reviewed:    Recent Labs: 05/02/2018: ALT 49; Hemoglobin 15.5; Platelets 160; TSH 1.430 05/09/2018: BUN 46; Creatinine, Ser 1.29; Potassium 3.9; Sodium 140   Recent Lipid Panel Lab Results  Component Value Date/Time   CHOL 114 05/01/2017 04:13 AM   CHOL 136 02/07/2016 08:00 AM   TRIG 109 05/01/2017 04:13 AM   HDL 34 (L) 05/01/2017 04:13 AM   HDL 54 02/07/2016 08:00 AM   CHOLHDL 3.4 05/01/2017 04:13 AM   LDLCALC 58 05/01/2017 04:13 AM   LDLCALC 64 02/07/2016 08:00 AM    Wt Readings from Last 3 Encounters:  05/14/18 129 lb 6.4 oz (58.7 kg)  05/02/18 128 lb (58.1 kg)  02/21/18 118 lb (53.5 kg)     Exam:    Vital Signs: Vital signs may also be detailed in the HPI There were no vitals taken for this visit.  Wt Readings from Last 3 Encounters:  05/14/18 129 lb 6.4 oz (58.7 kg)  05/02/18 128 lb (58.1 kg)  02/21/18 118 lb (53.5 kg)   Temp Readings from Last 3 Encounters:  12/17/17 97.8 F (36.6 C) (Oral)  11/12/17 97.8 F (36.6 C) (Oral)  10/18/17 98.2 F (36.8 C) (Oral)   BP Readings from Last 3 Encounters:  05/14/18 138/70  05/02/18 (!) 146/66  02/21/18 130/62   Pulse Readings from Last 3 Encounters:  05/14/18 72  02/21/18 63  12/17/17 80    626 up to 948 systolic over 80, pulses 54O, respirations 16  Well nourished, well developed male in no acute distress. Constitutional:  oriented to person, place, and time. No distress.    ASSESSMENT & PLAN:  PAD (peripheral artery disease) (HCC) Periodic ultrasound showing stable disease  Coronary artery disease of native artery of native heart with stable angina pectoris ([place]) He denies any angina but he does have drop in ejection fraction down to 25% He is declining cardiac catheterization He might reconsider at a later date  Pure hypercholesterolemia Cholesterol at goal, no changes to his medications  Pulmonary  hypertension (HCC) Weight stable on current fluid restrictions and diuretic  Essential hypertension Blood pressures been running high over the past several months, sometimes 379 or higher systolic Unclear if this is contributing to depressed ejection fraction We will start Entresto as detailed below  Shortness of breath Pulmonary hypertension, ischemic heart disease, COPD Deconditioning, recommend he continue his walking program  CKD (chronic kidney disease), stage III ([place]) Numbers are stable, followed by Dr. Candiss Norse We will start [facility] given dramatic worsening of his ejection fraction  Dilated cardiomyopathy Drop in ejection fraction over the past year and a half previously ejection fraction 55% or higher on echocardiogram Most recent echocardiogram showing ejection fraction 25 to 30% He does report some weakness, fatigue Denies chest pain concerning for angina -Discussed with him various treatment options including cardiac catheterization to look for worsening ischemia -Wife and patient prefer noninvasive options to start Recommend he start Entresto 24/26 mg p.o. twice daily for 2 weeks then up to 49/51 mg twice daily I will meet up again with them in 3 weeks time Will need BMP/LFTs given renal dysfunction at baseline -At a later date once on maximum tolerated dose of Entresto, we would recommend a repeat echocardiogram   COVID-19 Education: The signs and symptoms of COVID-19 were discussed with the patient and how to seek care for testing (follow up with PCP or arrange E-visit).  The importance of social distancing was discussed today.  Patient Risk:   After full review of this patients clinical status, I feel that they are at least moderate risk at this time.  Time:   Today, I have spent 45 minutes with the patient with telehealth technology discussing the cardiac and medical problems/diagnoses detailed above   10 min spent reviewing the chart prior to patient visit today  Long discussion with him today concerning various treatment options for his dilated cardiomyopathy, depressed ejection fraction Risk and benefit of cardiac catheterization discussed with him, discussed various treatment options including Entresto Reviewed prior echocardiograms going back 5 years   Medication Adjustments/Labs and Tests Ordered: Current medicines are reviewed at length with the patient today.  Concerns regarding medicines are outlined above.   Tests Ordered: No tests ordered   Medication Changes: No changes made   Disposition: Follow-up in 3 weeks   Signed, Ida Rogue, MD  07/22/2018 12:44 PM    [place] Office [address], [place], [place] [address]

## 2018-07-22 ENCOUNTER — Other Ambulatory Visit: Payer: Self-pay

## 2018-07-22 ENCOUNTER — Telehealth: Payer: Self-pay

## 2018-07-22 ENCOUNTER — Telehealth (INDEPENDENT_AMBULATORY_CARE_PROVIDER_SITE_OTHER): Payer: Medicare PPO | Admitting: Cardiovascular Disease

## 2018-07-22 DIAGNOSIS — I10 Essential (primary) hypertension: Secondary | ICD-10-CM | POA: Diagnosis not present

## 2018-07-22 DIAGNOSIS — I739 Peripheral vascular disease, unspecified: Secondary | ICD-10-CM

## 2018-07-22 DIAGNOSIS — R0602 Shortness of breath: Secondary | ICD-10-CM

## 2018-07-22 DIAGNOSIS — N183 Chronic kidney disease, stage 3 unspecified: Secondary | ICD-10-CM

## 2018-07-22 DIAGNOSIS — I420 Dilated cardiomyopathy: Secondary | ICD-10-CM | POA: Diagnosis not present

## 2018-07-22 DIAGNOSIS — E7800 Pure hypercholesterolemia, unspecified: Secondary | ICD-10-CM

## 2018-07-22 DIAGNOSIS — I272 Pulmonary hypertension, unspecified: Secondary | ICD-10-CM | POA: Diagnosis not present

## 2018-07-22 DIAGNOSIS — I25118 Atherosclerotic heart disease of native coronary artery with other forms of angina pectoris: Secondary | ICD-10-CM | POA: Diagnosis not present

## 2018-07-22 MED ORDER — SACUBITRIL-VALSARTAN 49-51 MG PO TABS: 1.0000 | ORAL_TABLET | Freq: Two times a day (BID) | ORAL | 11 refills | Status: DC

## 2018-07-22 NOTE — Telephone Encounter (Signed)
PA started through [facility] (Key: [id number]) Rx #: [id number] Entresto 49-51MG  tablets  Awaiting response

## 2018-07-22 NOTE — Patient Instructions (Addendum)
Medication Instructions:  Your physician has recommended you make the following change in your medication:  1. START Entresto 49/51 mg 1 tablet twice a day.   If you need a refill on your cardiac medications before your next appointment, please call your pharmacy.    Lab work: No new labs needed   If you have labs (blood work) drawn today and your tests are completely normal, you will receive your results only by: Marland Kitchen MyChart Message (if you have MyChart) OR . A paper copy in the mail If you have any lab test that is abnormal or we need to change your treatment, we will call you to review the results.   Testing/Procedures: No new testing needed   Follow-Up: At [facility], you and your health needs are our priority.  As part of our continuing mission to provide you with exceptional heart care, we have created designated Provider Care Teams.  These Care Teams include your primary Cardiologist (physician) and Advanced Practice Providers (APPs -  Physician Assistants and Nurse Practitioners) who all work together to provide you with the care you need, when you need it.  . You will need a follow up appointment in 3 weeks  . Providers on your designated Care Team:   . Murray Hodgkins, NP . Christell Faith, PA-C . Marrianne Mood, PA-C  Any Other Special Instructions Will Be Listed Below (If Applicable).  For educational health videos Log in to : [email or web address] Or : [email or web address], password : [email or web address]

## 2018-07-22 NOTE — Addendum Note (Signed)
Addended by: Valora Corporal on: 07/22/2018 12:57 PM   Modules accepted: Orders

## 2018-07-23 NOTE — Telephone Encounter (Signed)
PA Case: [id number] Status: Approved Coverage Starts on: 03/06/2018 Coverage Ends on: 03/06/2019 Questions? Contact [phone].

## 2018-07-31 ENCOUNTER — Other Ambulatory Visit: Payer: Self-pay | Admitting: Cardiovascular Disease

## 2018-08-01 DIAGNOSIS — Z86010 Personal history of colonic polyps: Secondary | ICD-10-CM | POA: Diagnosis not present

## 2018-08-01 DIAGNOSIS — Z7901 Long term (current) use of anticoagulants: Secondary | ICD-10-CM | POA: Diagnosis not present

## 2018-08-01 DIAGNOSIS — Z800 Family history of malignant neoplasm of digestive organs: Secondary | ICD-10-CM | POA: Diagnosis not present

## 2018-08-01 DIAGNOSIS — I2510 Atherosclerotic heart disease of native coronary artery without angina pectoris: Secondary | ICD-10-CM | POA: Diagnosis not present

## 2018-08-01 NOTE — Telephone Encounter (Signed)
Please review for refill. Not listed on current medication list.

## 2018-08-01 NOTE — Telephone Encounter (Signed)
02/21/18- the patient was seen by Christell Faith, PA and felt to have AKI maybe in relation to metolazone. The patient patient was told to hold this at his visit- BMP drawn same day.  Per 02/21/18- BMP results- Thurmond Butts, PA advised to continue to hold metolazone and only use for weight >129 lbs.   Per E-visit 07/22/18 with Dr. [name]: home goal weight has been felt to be 125 pounds.  Weight currently 121 ,  No edema Legs feeling weaker, not walking as much  Would not refill metolazone at this time.

## 2018-08-02 ENCOUNTER — Telehealth: Payer: Self-pay | Admitting: Cardiovascular Disease

## 2018-08-02 NOTE — Telephone Encounter (Signed)
° °  [facility] Pre-operative Risk Assessment    Request for surgical clearance:  1. What type of surgery is being performed? Clonoscopy  2. When is this surgery scheduled? 08/14/18  3. What type of clearance is required (medical clearance vs. Pharmacy clearance to hold med vs. Both)?  both  4. Are there any medications that need to be held prior to surgery and how long?Eliquis please advise    5. Practice name and name of physician performing surgery? [facility]   6. What is your office phone number [phone]   7.   What is your office fax number [phone]  8.   Anesthesia type (None, local, MAC, general) ? Not noted    Clarisse Gouge 08/02/2018, 3:03 PM  _________________________________________________________________   (provider comments below)

## 2018-08-02 NOTE — Telephone Encounter (Signed)
Patient with diagnosis of DVT on Xarelto for anticoagulation.    Procedure: Clonoscopy Date of procedure: 08/14/2018  CrCl 32  Per office protocol, patient can hold Xarelto for 1 day prior to procedure.

## 2018-08-05 NOTE — Telephone Encounter (Signed)
   Primary Cardiologist: Ida Rogue, MD  Chart reviewed as part of pre-operative protocol coverage. Patient was contacted 08/05/2018 in reference to pre-operative risk assessment for pending surgery as outlined below.  Christo Hain was recently seen 5/18 via Telemedicine by Dr. [name] with reported weakness, fatigue, and elevated BP. Cardiac cath was suggested but he declined and instead the plan was to start [facility]. Given recent symptom evaluation with medication change and planned f/u, will route to Dr. [name] for input whether OK to proceed with colonoscopy.   Dr. [name], - Please route response to P CV DIV PREOP (the pre-op pool). Thank you.  Charlie Pitter, PA-C 08/05/2018, 10:27 AM

## 2018-08-06 MED ORDER — METOLAZONE 2.5 MG PO TABS: 2.5000 mg | ORAL_TABLET | Freq: Every day | ORAL | 0 refills | Status: DC | PRN

## 2018-08-06 NOTE — Telephone Encounter (Signed)
   Primary Cardiologist:Timothy [name], MD  Chart revisited as part of pre-operative protocol coverage. To summarize recommendations:  - Dr. [name] feels patient is acceptable risk to proceed with colonoscopy.  - Per pharmD who reviewed chart, "Per office protocol, patient can hold Xarelto for 1 day prior to procedure.." Dr. [name] states, "Okay to hold Xarelto 1 or 2 days as GI requires."  Will route this bundled recommendation to requesting provider via Epic fax function. Please call with questions.  Charlie Pitter, PA-C 08/06/2018, 1:35 PM

## 2018-08-06 NOTE — Telephone Encounter (Signed)
Acceptable risk to proceed with colonoscopy Okay to hold Xarelto 1 or 2 days as GI requires

## 2018-08-16 DIAGNOSIS — M5136 Other intervertebral disc degeneration, lumbar region: Secondary | ICD-10-CM | POA: Diagnosis not present

## 2018-08-19 DIAGNOSIS — M8580 Other specified disorders of bone density and structure, unspecified site: Secondary | ICD-10-CM | POA: Diagnosis not present

## 2018-08-19 DIAGNOSIS — M65322 Trigger finger, left index finger: Secondary | ICD-10-CM | POA: Diagnosis not present

## 2018-08-19 DIAGNOSIS — M109 Gout, unspecified: Secondary | ICD-10-CM | POA: Diagnosis not present

## 2018-08-19 DIAGNOSIS — M961 Postlaminectomy syndrome, not elsewhere classified: Secondary | ICD-10-CM | POA: Diagnosis not present

## 2018-08-19 DIAGNOSIS — G894 Chronic pain syndrome: Secondary | ICD-10-CM | POA: Diagnosis not present

## 2018-08-19 DIAGNOSIS — Z79899 Other long term (current) drug therapy: Secondary | ICD-10-CM | POA: Diagnosis not present

## 2018-08-19 DIAGNOSIS — Z5181 Encounter for therapeutic drug level monitoring: Secondary | ICD-10-CM | POA: Diagnosis not present

## 2018-08-21 ENCOUNTER — Telehealth: Payer: Medicare PPO | Admitting: Cardiovascular Disease

## 2018-08-23 DIAGNOSIS — M898X9 Other specified disorders of bone, unspecified site: Secondary | ICD-10-CM

## 2018-08-26 NOTE — Telephone Encounter (Signed)
I ordered the vitamin D test---please set up a time for him to come in for the lab

## 2018-08-27 ENCOUNTER — Other Ambulatory Visit (INDEPENDENT_AMBULATORY_CARE_PROVIDER_SITE_OTHER): Payer: Medicare PPO

## 2018-08-27 DIAGNOSIS — M898X9 Other specified disorders of bone, unspecified site: Secondary | ICD-10-CM

## 2018-08-27 LAB — VITAMIN D 25 HYDROXY (VIT D DEFICIENCY, FRACTURES): VITD: 55.59 ng/mL (ref 30.00–100.00)

## 2018-08-28 ENCOUNTER — Telehealth: Payer: Self-pay

## 2018-08-28 NOTE — Telephone Encounter (Signed)
It is recommended that he come in alone. Do they have a cell # we could call when Dr. [name] is in the room and have on speaker phone?   If not, then Dr. [name] will need to ok this.

## 2018-08-28 NOTE — Telephone Encounter (Signed)
.   In the past 7 to 10 days have you had a cough, shortness of breath, headache, congestion, fever (100 or greater) body aches, chills, sore throat, or sudden loss of taste or sense of smell? NO . Have you been around anyone with known Covid 19? NO . Have you been around anyone who is awaiting Covid 19 test results in the past 7 to 10 days? NO . Have you been around anyone who has been exposed to Covid 19, or has mentioned symptoms of Covid 19 within the past 7 to 10 days? NO . If you have any concerns/questions about symptoms patients report during screening (either on the phone or at threshold). Contact the provider seeing the patient or DOD for further guidance. If neither are available contact a member of the leadership team."   After screening the patient, him and his wife asked if she would be able to come back with the patient.  I made them aware that [facility] has a NO visitors policy unless the patient is unable to speak for themself.  She stated he is very hard of hearing and will need someone there with him.   I went ahead and asked her the screening questions and made then aware I could route for a nurse to review.

## 2018-08-30 ENCOUNTER — Telehealth: Payer: Self-pay | Admitting: Cardiovascular Disease

## 2018-08-30 NOTE — Telephone Encounter (Signed)

## 2018-08-31 NOTE — Progress Notes (Signed)
Cardiology Office Note  Date:  09/02/2018   ID:  Dennis Burns, DOB 12-12-1928, MRN [id number]  PCP:  Dennis Carbon, MD   Chief Complaint  Patient presents with  . Chronic SOB    HPI:  Dennis Burns is a pleasant 85 year old gentleman with history of  Chronic shortness of breath  mild aortic valve stenosis/moderate MR/moderate pulmonary hypertension,  PAD, s/p CEA on the right,  Duplex showed significant bilateral common iliac artery stenosis as well as severe left SFA stenosis. Hyperlipidemia,  hypertension,  coronary artery disease, CABG x 4 in 2003,  Chronic renal insufficiency,  atrophic kidney,  living off one kidney, seen by nephrology,Dr. Candiss Burns chronic back pain, sciatica, spinal stenosis, back surgery 2  prostate cancer, radioactive seeds  DVT in right leg noted before 2012 surgery, on  xarelto echocardiogram October 2017 showing mild to moderate aortic valve stenosis, moderate MR, moderately elevated right heart pressures, ejection fraction 55% or greater prior smoking history less than 10 years when he was younger  who presents for follow-up of his coronary disease and PAD chronic shortness of breath  On last televisit 07/22/2018: drop in ejection fraction down to 25%  declining cardiac catheterization He might reconsider at a later date Started on Entresto 24/26 mg p.o. twice daily for 2 weeks then up to 49/51 mg twice daily  Wife continues to indicate the patient snores and has apneic episodes during the night.   home goal weight has been felt to be 125 pounds.  Weight currently 121 ,  No edema Legs feeling weaker, not walking as much  Weight 130 On today's visit Ankle swelling is getting worse  Taking metolazone 2.5 three x a week Lasix 60 BID Despite this regiment weight has been trending upwards  Severe back pain on pain meds Sitting more, unable to get around at all secondary to pain  EKG personally reviewed by myself on todays visit Shows  normal sinus rhythm with rate 68 bpm T wave abnormality V3 through V6 1 and aVL   Carotid artery ultrasound from 12/2017 showed 1-39% RICA stenosis, 79-02% LICA stenosis, stenotic left subclavian artery with disturbance of flow in the right subclavian artery.  Echo 07/16/2018 1. The left ventricle has severely reduced systolic function, with an ejection fraction of 25-30%.  2. The right ventricle has severely reduced systolic function. The cavity was moderately enlarged.  3. Left atrial size was mildly dilated. 4. Right atrial size was mildly dilated.  moderate aortic stenosis with a mean gradient of 20 mmHg and estimated valve area of 0.6-0.7 cm^2  Echo : 04/2017: EF 35 to 40%  Last cath 02/2017, severe diffuse disease Echo  01/24/2017: EF 55 to 60%  Long discussion concerning previous carotid ultrasound 40-59% disease on the left with significant  left subclavian disease Good left radial artery pulse with no symptoms in the left arm  Previous Long hospitalization for acute on chronic respiratory distress Hospital records reviewed with the patient in detail On and off bipap, aggressive diuresis , severe pulm HTN, CRF Mod to severe MR,  mod TR, EF 35% milrinone during this admission  Moderate aortic stenosis with moderate aortic insufficiency D/c on lasix 40 BID  Cardiac cath 02/14/217 Severe underlying heavily calcified three-vessel coronary artery disease occluded LAD,  ostial right coronary artery and severe ostial left circumflex disease.   Patent LIMA to LAD and SVG to right coronary artery. The SVG to OM is possibly occluded, not visible on asending aortogram.  graft might be  open given that there is some mild competitive flow in the distal left circumflex. -- left subclavian tortuosity and heavy calcifications throughout.  2.  Right heart catheterization showed only mildly elevated filling pressures, moderate pulmonary hypertension and normal cardiac  output. Pulmonary wedge pressure was 16 mmHg, PA pressure was 55 over 13 mmHg and cardiac output was 5.84 with a cardiac index of 3.5.  3.  Left ventricular angiography was not performed due to chronic kidney disease.  --The native left circumflex is too high risk for PCI given involvement of left main, ostial location and heavy calcifications.  treat medically  --moderate pulmonary hypertension   Followed by Dr. Candiss Burns Previous creatinine 1.48 in October  Takes Lasix daily , sometimes twice a day After fluid hydration following cardiac catheterization creatinine down to 1.2, BUN 21 GFR up to 50  Seen in the emergency room 07/22/2016 for chest pain This occurred when he was rushing and had significant shortness of breath Last stress test September 2017, no significant ischemia on review of images by myself Blood pressure elevated in the emergency room Creatinine up to 1.66, BUN 53  Previous noninvasive vascular evaluation which showed normal ABI on the right side and mildly reduced on the left at 0.71.  Duplex showed significant bilateral common iliac artery stenosis as well as severe left SFA stenosis.  Carotid ultrasound reviewed with him showing 60-79% left carotid disease 40-59% disease on the right  Lower extremity arterial Doppler showing at least moderate common iliac arterial disease, severe disease left mid SFA   back surgery 2012 and 2014  PMH:   has a past medical history of Allergic rhinitis due to pollen, Chronic kidney disease, stage III (moderate) ([place]), Coronary artery disease, DVT, lower extremity, recurrent ([place]), Glaucoma, History of prostate cancer (2004), Hyperlipidemia, Hypertension, Hypothyroidism, Impaired fasting glucose, Prostate cancer ([place]) (2004), and Spinal stenosis of lumbar region with radiculopathy.  PSH:    Past Surgical History:  Procedure Laterality Date  . CAROTID ENDARTERECTOMY Right 09/2004   [place] GRAFT   2003   [place]   after fall  . INSERTION PROSTATE RADIATION SEED  2004   and RT  . POSTERIOR LUMBAR FUSION  2012, 2014  . RIGHT/LEFT HEART CATH AND CORONARY ANGIOGRAPHY N/A 02/14/2017   Procedure: RIGHT/LEFT HEART CATH AND CORONARY ANGIOGRAPHY;  Surgeon: Dennis Hampshire, MD;  Location: [facility];  Service: Cardiovascular;  Laterality: N/A;    Current Outpatient Medications  Medication Sig Dispense Refill  . Alpha-Lipoic Acid 200 MG CAPS Take 1 capsule by mouth daily.    Marland Kitchen apixaban (ELIQUIS) 2.5 MG TABS tablet Take 1 tablet (2.5 mg total) by mouth 2 (two) times daily. 180 tablet 3  . atorvastatin (LIPITOR) 40 MG tablet Take 1 tablet (40 mg total) by mouth daily. 90 tablet 3  . CALCIUM CITRATE PO Take 1 tablet by mouth daily. Takes 300mg  daily.    . carvedilol (COREG) 12.5 MG tablet Take 0.5 tablets (6.25 mg total) by mouth 2 (two) times daily with a meal. 90 tablet 3  . Cholecalciferol (VITAMIN D) 2000 units CAPS Take 4,000 Units by mouth daily.    . Coenzyme Q10 (CO Q10) 200 MG CAPS Take 1 capsule by mouth daily.    Marland Kitchen CRANBERRY EXTRACT PO Take 1 tablet by mouth daily. Takes 1500mg  daily    . ezetimibe (ZETIA) 10 MG tablet TAKE 1 TABLET EVERY DAY 90  tablet 4  . feeding supplement, ENSURE ENLIVE, (ENSURE ENLIVE) LIQD Take 237 mLs by mouth 2 (two) times daily between meals. 237 mL 12  . Ferrous Gluconate (IRON 27 PO) Take by mouth daily.    . furosemide (LASIX) 40 MG tablet Take 1.5 tablets (60 mg total) by mouth 2 (two) times daily. 270 tablet 3  . hydrALAZINE (APRESOLINE) 10 MG tablet TAKE 1 TABLET THREE TIMES DAILY 270 tablet 3  . isosorbide mononitrate (IMDUR) 60 MG 24 hr tablet Take 1 tablet (60 mg total) by mouth daily. 90 tablet 3  . latanoprost (XALATAN) 0.005 % ophthalmic solution Place 1 drop into both eyes at bedtime. In morning    . levothyroxine (SYNTHROID, LEVOTHROID) 75 MCG tablet Take 1 tablet (75 mcg total) by mouth daily. 90  tablet 3  . metolazone (ZAROXOLYN) 2.5 MG tablet Take 1 tablet (2.5 mg total) by mouth daily as needed. 90 tablet 0  . Multiple Vitamin (MULTIVITAMIN) tablet Take 1 tablet by mouth daily.    . nitroGLYCERIN (NITROSTAT) 0.4 MG SL tablet Place 1 tablet (0.4 mg total) under the tongue every 5 (five) minutes as needed for chest pain.  12  . Omega 3-6-9 Fatty Acids (OMEGA-3-6-9 PO) Take 1 capsule by mouth 2 (two) times daily.    . potassium chloride (K-DUR) 10 MEQ tablet Take 1 tablet (10 mEq total) by mouth as directed. Take 3 times daily and 4 pills when taking metolazone (Patient taking differently: Take 10 mEq by mouth 2 (two) times daily. Take 3 times daily and 4 pills when taking metolazone) 360 tablet 3  . sacubitril-valsartan (ENTRESTO) 49-51 MG Take 1 tablet by mouth 2 (two) times daily. 60 tablet 11  . timolol (BETIMOL) 0.5 % ophthalmic solution Place 1 drop into both eyes 2 (two) times daily.    . traMADol (ULTRAM) 50 MG tablet Take 2 tablets (100 mg total) by mouth 3 (three) times daily as needed. 540 tablet 0  . HYDROcodone-acetaminophen (NORCO/VICODIN) 5-325 MG tablet hydrocodone 5 mg-acetaminophen 325 mg tablet  1 tab every 8 hrs as needed for pain may fill 08/19/18     No current facility-administered medications for this visit.      Allergies:   Patient has no known allergies.   Social History:  The patient  reports that he quit smoking about 42 years ago. He has a 4.50 pack-year smoking history. He has never used smokeless tobacco. He reports current alcohol use. He reports that he does not use drugs.   Family History:   family history includes Cancer in his father and mother; Glaucoma in his mother; Stroke in his maternal grandfather.    Review of Systems: Review of Systems  Respiratory: Positive for shortness of breath.   Cardiovascular: Positive for leg swelling.  Gastrointestinal: Negative.   Musculoskeletal: Negative.   Neurological: Negative.   Psychiatric/Behavioral:  Negative.   All other systems reviewed and are negative.    PHYSICAL EXAM: VS:  BP 108/60 (BP Location: Left Arm, Patient Position: Sitting, Cuff Size: Normal)   Pulse 68   Ht 5\' 3"  (1.6 m)   Wt 130 lb (59 kg)   BMI 23.03 kg/m  , BMI Body mass index is 23.03 kg/m. Constitutional:  oriented to person, place, and time. No distress.  HENT:  Head: Grossly normal Eyes:  no discharge. No scleral icterus.  Neck: No JVD, no carotid bruits  Cardiovascular: Regular rate and rhythm, no murmurs appreciated 1+ pitting lower extremity edema bilaterally to the mid  shins Pulmonary/Chest: Clear to auscultation bilaterally, no wheezes or rails Abdominal: Soft.  no distension.  no tenderness.  Musculoskeletal: Normal range of motion Neurological:  normal muscle tone. Coordination normal. No atrophy Skin: Skin warm and dry Psychiatric: normal affect, pleasant  Recent Labs: 05/02/2018: ALT 49; Hemoglobin 15.5; Platelets 160; TSH 1.430 05/09/2018: BUN 46; Creatinine, Ser 1.29; Potassium 3.9; Sodium 140    Lipid Panel Lab Results  Component Value Date   CHOL 114 05/01/2017   HDL 34 (L) 05/01/2017   LDLCALC 58 05/01/2017   TRIG 109 05/01/2017      Wt Readings from Last 3 Encounters:  09/02/18 130 lb (59 kg)  05/14/18 129 lb 6.4 oz (58.7 kg)  05/02/18 128 lb (58.1 kg)       ASSESSMENT AND PLAN:  Coronary artery disease involving native coronary artery of native heart without angina pectoris  On prior office visit he declined cardiac catheterization for drop in ejection fraction Prefer to treat this medically given his underlying renal dysfunction He was started on Entresto and reports that he feels better  Pulmonary hypertension Goal weight 125 pounds at home or less Currently weight 130 pounds or higher with worsening leg swelling Recommend he change his Lasix to torsemide 40 twice daily and stay on metolazone 2.5 mg as needed  Pure hypercholesterolemia - Plan: EKG 20-BTDH, Basic  Metabolic Panel (BMET) Tolerating Zetia with Lipitor Goal LDL less than 70 stable  Essential hypertension -  blood pressure stable No changes made, stable  SOB (shortness of breath) - Plan: EKG 74-BULA, Basic Metabolic Panel (BMET) Moderate, stable Unable to walk very far secondary to severe back pain  Recurrent deep vein thrombosis (DVT) of right lower extremity (HCC) on anticoagulation , Eliquis 2.5 twice daily given his renal dysfunction  No recurrence.  Stable  Spinal stenosis of lumbar region with radiculopathy Severe pain, has follow-up with his team tomorrow  Chronic kidney disease, stage III (moderate) Followed by Dr. Candiss Burns.  We will change Lasix to torsemide 40 twice daily given weight gain and lower extremity edema  PAD (peripheral artery disease) (HCC) Stable at least moderate bilateral disease Previously seen by Dr. Fletcher Anon    Total encounter time more than 25 minutes  Greater than 50% was spent in counseling and coordination of care with the patient  Disposition:   F/U  2 months   Orders Placed This Encounter  Procedures  . EKG 12-Lead     Signed, Esmond Plants, M.D., Ph.D. 09/02/2018  [place], [place]

## 2018-09-02 ENCOUNTER — Ambulatory Visit: Payer: Medicare PPO | Admitting: Cardiovascular Disease

## 2018-09-02 ENCOUNTER — Encounter: Payer: Self-pay | Admitting: Cardiovascular Disease

## 2018-09-02 ENCOUNTER — Other Ambulatory Visit: Payer: Self-pay

## 2018-09-02 VITALS — BP 108/60 | HR 68 | Ht 63.0 in | Wt 130.0 lb

## 2018-09-02 DIAGNOSIS — I10 Essential (primary) hypertension: Secondary | ICD-10-CM | POA: Diagnosis not present

## 2018-09-02 DIAGNOSIS — N183 Chronic kidney disease, stage 3 unspecified: Secondary | ICD-10-CM

## 2018-09-02 DIAGNOSIS — I25118 Atherosclerotic heart disease of native coronary artery with other forms of angina pectoris: Secondary | ICD-10-CM | POA: Diagnosis not present

## 2018-09-02 DIAGNOSIS — I272 Pulmonary hypertension, unspecified: Secondary | ICD-10-CM

## 2018-09-02 DIAGNOSIS — I255 Ischemic cardiomyopathy: Secondary | ICD-10-CM | POA: Diagnosis not present

## 2018-09-02 DIAGNOSIS — R0602 Shortness of breath: Secondary | ICD-10-CM

## 2018-09-02 DIAGNOSIS — I739 Peripheral vascular disease, unspecified: Secondary | ICD-10-CM

## 2018-09-02 DIAGNOSIS — I5042 Chronic combined systolic (congestive) and diastolic (congestive) heart failure: Secondary | ICD-10-CM | POA: Diagnosis not present

## 2018-09-02 MED ORDER — TORSEMIDE 20 MG PO TABS: 40.0000 mg | ORAL_TABLET | Freq: Two times a day (BID) | ORAL | 3 refills | Status: DC

## 2018-09-02 NOTE — Patient Instructions (Addendum)
Medication Instructions:  Your physician has recommended you make the following change in your medication:  1. STOP Furosemide (Lasix) 2. START Torsemide 20 mg take 2 tablets (40 mg) twice a day 3. Continue Metolazone sparingly  If you need a refill on your cardiac medications before your next appointment, please call your pharmacy.    Lab work: No new labs needed   If you have labs (blood work) drawn today and your tests are completely normal, you will receive your results only by: Marland Kitchen MyChart Message (if you have MyChart) OR . A paper copy in the mail If you have any lab test that is abnormal or we need to change your treatment, we will call you to review the results.   Testing/Procedures: No new testing needed   Follow-Up: At [facility], you and your health needs are our priority.  As part of our continuing mission to provide you with exceptional heart care, we have created designated Provider Care Teams.  These Care Teams include your primary Cardiologist (physician) and Advanced Practice Providers (APPs -  Physician Assistants and Nurse Practitioners) who all work together to provide you with the care you need, when you need it.  . You will need a follow up appointment in 2 months   . Providers on your designated Care Team:   . Murray Hodgkins, NP . Christell Faith, PA-C . Marrianne Mood, PA-C  Any Other Special Instructions Will Be Listed Below (If Applicable).  For educational health videos Log in to : [email or web address] Or : [email or web address], password : [email or web address]

## 2018-09-03 DIAGNOSIS — M4856XA Collapsed vertebra, not elsewhere classified, lumbar region, initial encounter for fracture: Secondary | ICD-10-CM | POA: Diagnosis not present

## 2018-09-03 DIAGNOSIS — M4854XA Collapsed vertebra, not elsewhere classified, thoracic region, initial encounter for fracture: Secondary | ICD-10-CM | POA: Diagnosis not present

## 2018-09-04 MED ORDER — LEVOTHYROXINE SODIUM 75 MCG PO TABS: 75.0000 ug | ORAL_TABLET | Freq: Every day | ORAL | 3 refills | Status: DC

## 2018-09-14 DIAGNOSIS — M4854XA Collapsed vertebra, not elsewhere classified, thoracic region, initial encounter for fracture: Secondary | ICD-10-CM | POA: Diagnosis not present

## 2018-09-14 DIAGNOSIS — M48061 Spinal stenosis, lumbar region without neurogenic claudication: Secondary | ICD-10-CM | POA: Diagnosis not present

## 2018-09-17 DIAGNOSIS — M545 Low back pain: Secondary | ICD-10-CM | POA: Diagnosis not present

## 2018-09-23 DIAGNOSIS — M5416 Radiculopathy, lumbar region: Secondary | ICD-10-CM | POA: Diagnosis not present

## 2018-10-02 DIAGNOSIS — M5416 Radiculopathy, lumbar region: Secondary | ICD-10-CM | POA: Diagnosis not present

## 2018-10-02 DIAGNOSIS — G894 Chronic pain syndrome: Secondary | ICD-10-CM | POA: Diagnosis not present

## 2018-10-05 ENCOUNTER — Emergency Department: Payer: Medicare PPO

## 2018-10-05 ENCOUNTER — Inpatient Hospital Stay
Admission: EM | Admit: 2018-10-05 | Discharge: 2018-10-06 | DRG: 684 | Disposition: A | Discharge status: Other Acute Inpatient Hospital | Payer: Medicare PPO | Attending: Internal Medicine | Admitting: Internal Medicine

## 2018-10-05 ENCOUNTER — Encounter: Payer: Self-pay | Admitting: Emergency Medicine

## 2018-10-05 ENCOUNTER — Other Ambulatory Visit: Payer: Self-pay

## 2018-10-05 DIAGNOSIS — R0902 Hypoxemia: Secondary | ICD-10-CM | POA: Diagnosis not present

## 2018-10-05 DIAGNOSIS — N179 Acute kidney failure, unspecified: Secondary | ICD-10-CM | POA: Diagnosis not present

## 2018-10-05 DIAGNOSIS — A419 Sepsis, unspecified organism: Secondary | ICD-10-CM

## 2018-10-05 DIAGNOSIS — I712 Thoracic aortic aneurysm, without rupture: Secondary | ICD-10-CM | POA: Diagnosis present

## 2018-10-05 DIAGNOSIS — Z86718 Personal history of other venous thrombosis and embolism: Secondary | ICD-10-CM | POA: Diagnosis not present

## 2018-10-05 DIAGNOSIS — R509 Fever, unspecified: Secondary | ICD-10-CM | POA: Diagnosis not present

## 2018-10-05 DIAGNOSIS — Z981 Arthrodesis status: Secondary | ICD-10-CM | POA: Diagnosis not present

## 2018-10-05 DIAGNOSIS — R0689 Other abnormalities of breathing: Secondary | ICD-10-CM | POA: Diagnosis not present

## 2018-10-05 DIAGNOSIS — R531 Weakness: Secondary | ICD-10-CM | POA: Diagnosis not present

## 2018-10-05 DIAGNOSIS — E039 Hypothyroidism, unspecified: Secondary | ICD-10-CM | POA: Diagnosis not present

## 2018-10-05 DIAGNOSIS — G8929 Other chronic pain: Secondary | ICD-10-CM | POA: Diagnosis not present

## 2018-10-05 DIAGNOSIS — I9589 Other hypotension: Secondary | ICD-10-CM | POA: Diagnosis present

## 2018-10-05 DIAGNOSIS — Z79890 Hormone replacement therapy (postmenopausal): Secondary | ICD-10-CM | POA: Diagnosis not present

## 2018-10-05 DIAGNOSIS — Z951 Presence of aortocoronary bypass graft: Secondary | ICD-10-CM

## 2018-10-05 DIAGNOSIS — N189 Chronic kidney disease, unspecified: Secondary | ICD-10-CM | POA: Diagnosis present

## 2018-10-05 DIAGNOSIS — I10 Essential (primary) hypertension: Secondary | ICD-10-CM | POA: Diagnosis not present

## 2018-10-05 DIAGNOSIS — I517 Cardiomegaly: Secondary | ICD-10-CM | POA: Diagnosis not present

## 2018-10-05 DIAGNOSIS — R4182 Altered mental status, unspecified: Secondary | ICD-10-CM | POA: Diagnosis not present

## 2018-10-05 DIAGNOSIS — R404 Transient alteration of awareness: Secondary | ICD-10-CM | POA: Diagnosis not present

## 2018-10-05 DIAGNOSIS — Z79899 Other long term (current) drug therapy: Secondary | ICD-10-CM

## 2018-10-05 DIAGNOSIS — Z87891 Personal history of nicotine dependence: Secondary | ICD-10-CM

## 2018-10-05 DIAGNOSIS — Z8546 Personal history of malignant neoplasm of prostate: Secondary | ICD-10-CM

## 2018-10-05 DIAGNOSIS — I129 Hypertensive chronic kidney disease with stage 1 through stage 4 chronic kidney disease, or unspecified chronic kidney disease: Secondary | ICD-10-CM | POA: Diagnosis not present

## 2018-10-05 DIAGNOSIS — J984 Other disorders of lung: Secondary | ICD-10-CM | POA: Diagnosis not present

## 2018-10-05 DIAGNOSIS — I959 Hypotension, unspecified: Secondary | ICD-10-CM | POA: Diagnosis not present

## 2018-10-05 DIAGNOSIS — N183 Chronic kidney disease, stage 3 (moderate): Secondary | ICD-10-CM | POA: Diagnosis present

## 2018-10-05 DIAGNOSIS — I2510 Atherosclerotic heart disease of native coronary artery without angina pectoris: Secondary | ICD-10-CM | POA: Diagnosis not present

## 2018-10-05 DIAGNOSIS — Z20828 Contact with and (suspected) exposure to other viral communicable diseases: Secondary | ICD-10-CM | POA: Diagnosis present

## 2018-10-05 DIAGNOSIS — R410 Disorientation, unspecified: Secondary | ICD-10-CM | POA: Diagnosis not present

## 2018-10-05 DIAGNOSIS — K5900 Constipation, unspecified: Secondary | ICD-10-CM | POA: Diagnosis present

## 2018-10-05 DIAGNOSIS — Z791 Long term (current) use of non-steroidal anti-inflammatories (NSAID): Secondary | ICD-10-CM

## 2018-10-05 DIAGNOSIS — Z7901 Long term (current) use of anticoagulants: Secondary | ICD-10-CM

## 2018-10-05 DIAGNOSIS — E860 Dehydration: Secondary | ICD-10-CM | POA: Diagnosis present

## 2018-10-05 DIAGNOSIS — R188 Other ascites: Secondary | ICD-10-CM | POA: Diagnosis not present

## 2018-10-05 DIAGNOSIS — N261 Atrophy of kidney (terminal): Secondary | ICD-10-CM | POA: Diagnosis not present

## 2018-10-05 DIAGNOSIS — K4090 Unilateral inguinal hernia, without obstruction or gangrene, not specified as recurrent: Secondary | ICD-10-CM | POA: Diagnosis not present

## 2018-10-05 DIAGNOSIS — E785 Hyperlipidemia, unspecified: Secondary | ICD-10-CM | POA: Diagnosis not present

## 2018-10-05 DIAGNOSIS — J90 Pleural effusion, not elsewhere classified: Secondary | ICD-10-CM | POA: Diagnosis not present

## 2018-10-05 DIAGNOSIS — F39 Unspecified mood [affective] disorder: Secondary | ICD-10-CM

## 2018-10-05 DIAGNOSIS — J9601 Acute respiratory failure with hypoxia: Secondary | ICD-10-CM | POA: Insufficient documentation

## 2018-10-05 LAB — URINALYSIS, ROUTINE W REFLEX MICROSCOPIC
Bilirubin Urine: NEGATIVE
Glucose, UA: NEGATIVE mg/dL
Hgb urine dipstick: NEGATIVE
Ketones, ur: NEGATIVE mg/dL
Leukocytes,Ua: NEGATIVE
Nitrite: NEGATIVE
Protein, ur: 30 mg/dL — AB
Specific Gravity, Urine: 1.016 (ref 1.005–1.030)
Squamous Epithelial / HPF: NONE SEEN (ref 0–5)
pH: 5 (ref 5.0–8.0)

## 2018-10-05 LAB — CBC WITH DIFFERENTIAL/PLATELET
Abs Immature Granulocytes: 0.08 10*3/uL — ABNORMAL HIGH (ref 0.00–0.07)
Basophils Absolute: 0 10*3/uL (ref 0.0–0.1)
Basophils Relative: 0 %
Eosinophils Absolute: 0 10*3/uL (ref 0.0–0.5)
Eosinophils Relative: 0 %
HCT: 27.8 % — ABNORMAL LOW (ref 39.0–52.0)
Hemoglobin: 8.6 g/dL — ABNORMAL LOW (ref 13.0–17.0)
Immature Granulocytes: 1 %
Lymphocytes Relative: 9 %
Lymphs Abs: 1 10*3/uL (ref 0.7–4.0)
MCH: 27.6 pg (ref 26.0–34.0)
MCHC: 30.9 g/dL (ref 30.0–36.0)
MCV: 89.1 fL (ref 80.0–100.0)
Monocytes Absolute: 1.1 10*3/uL — ABNORMAL HIGH (ref 0.1–1.0)
Monocytes Relative: 10 %
Neutro Abs: 8.6 10*3/uL — ABNORMAL HIGH (ref 1.7–7.7)
Neutrophils Relative %: 80 %
Platelets: 214 10*3/uL (ref 150–400)
RBC: 3.12 MIL/uL — ABNORMAL LOW (ref 4.22–5.81)
RDW: 21.2 % — ABNORMAL HIGH (ref 11.5–15.5)
Smear Review: NORMAL
WBC: 10.7 10*3/uL — ABNORMAL HIGH (ref 4.0–10.5)
nRBC: 0 % (ref 0.0–0.2)

## 2018-10-05 LAB — COMPREHENSIVE METABOLIC PANEL
ALT: 56 U/L — ABNORMAL HIGH (ref 0–44)
AST: 50 U/L — ABNORMAL HIGH (ref 15–41)
Albumin: 2.8 g/dL — ABNORMAL LOW (ref 3.5–5.0)
Alkaline Phosphatase: 88 U/L (ref 38–126)
Anion gap: 9 (ref 5–15)
BUN: 141 mg/dL — ABNORMAL HIGH (ref 8–23)
CO2: 23 mmol/L (ref 22–32)
Calcium: 9 mg/dL (ref 8.9–10.3)
Chloride: 106 mmol/L (ref 98–111)
Creatinine, Ser: 2.23 mg/dL — ABNORMAL HIGH (ref 0.61–1.24)
GFR calc Af Amer: 29 mL/min — ABNORMAL LOW (ref >60)
GFR calc non Af Amer: 25 mL/min — ABNORMAL LOW (ref >60)
Glucose, Bld: 173 mg/dL — ABNORMAL HIGH (ref 70–99)
Potassium: 5.3 mmol/L — ABNORMAL HIGH (ref 3.5–5.1)
Sodium: 138 mmol/L (ref 135–145)
Total Bilirubin: 0.9 mg/dL (ref 0.3–1.2)
Total Protein: 7.2 g/dL (ref 6.5–8.1)

## 2018-10-05 LAB — LACTIC ACID, PLASMA
Lactic Acid, Venous: 1.9 mmol/L (ref 0.5–1.9)
Lactic Acid, Venous: 1.6 mmol/L (ref 0.5–1.9)

## 2018-10-05 LAB — SARS CORONAVIRUS 2 BY RT PCR (HOSPITAL ORDER, PERFORMED IN ~~LOC~~ HOSPITAL LAB): SARS Coronavirus 2: NEGATIVE

## 2018-10-05 LAB — CBC
HCT: 27.9 % — ABNORMAL LOW (ref 39.0–52.0)
Hemoglobin: 8.5 g/dL — ABNORMAL LOW (ref 13.0–17.0)
MCH: 27.8 pg (ref 26.0–34.0)
MCHC: 30.5 g/dL (ref 30.0–36.0)
MCV: 91.2 fL (ref 80.0–100.0)
Platelets: 205 10*3/uL (ref 150–400)
RBC: 3.06 MIL/uL — ABNORMAL LOW (ref 4.22–5.81)
RDW: 21.2 % — ABNORMAL HIGH (ref 11.5–15.5)
WBC: 11.1 10*3/uL — ABNORMAL HIGH (ref 4.0–10.5)
nRBC: 0 % (ref 0.0–0.2)

## 2018-10-05 LAB — PROTIME-INR
INR: 1.4 — ABNORMAL HIGH (ref 0.8–1.2)
Prothrombin Time: 17.3 seconds — ABNORMAL HIGH (ref 11.4–15.2)

## 2018-10-05 LAB — APTT: aPTT: 40 seconds — ABNORMAL HIGH (ref 24–36)

## 2018-10-05 MED ORDER — VITAMIN D 25 MCG (1000 UNIT) PO TABS
4000.0000 [IU] | ORAL_TABLET | Freq: Every day | ORAL | Status: DC
  Administered 2018-10-05: 4000 [IU] via ORAL
  Filled 2018-10-05: qty 4

## 2018-10-05 MED ORDER — TRAMADOL HCL 50 MG PO TABS: 100.0000 mg | ORAL_TABLET | Freq: Three times a day (TID) | ORAL | Status: DC | PRN

## 2018-10-05 MED ORDER — ACETAMINOPHEN 325 MG PO TABS: 650.0000 mg | ORAL_TABLET | Freq: Four times a day (QID) | ORAL | Status: DC | PRN

## 2018-10-05 MED ORDER — SODIUM CHLORIDE 0.9 % IV SOLN
INTRAVENOUS | Status: DC
  Administered 2018-10-05: 18:00:00 via INTRAVENOUS

## 2018-10-05 MED ORDER — SODIUM CHLORIDE 0.9 % IV SOLN
2.0000 g | Freq: Once | INTRAVENOUS | Status: AC
  Administered 2018-10-05: 2 g via INTRAVENOUS
  Filled 2018-10-05: qty 2

## 2018-10-05 MED ORDER — ONDANSETRON HCL 4 MG PO TABS: 4.0000 mg | ORAL_TABLET | Freq: Four times a day (QID) | ORAL | Status: DC | PRN

## 2018-10-05 MED ORDER — ACETAMINOPHEN 650 MG RE SUPP: 650.0000 mg | Freq: Four times a day (QID) | RECTAL | Status: DC | PRN

## 2018-10-05 MED ORDER — OXYCODONE-ACETAMINOPHEN 7.5-325 MG PO TABS: 1.0000 | ORAL_TABLET | Freq: Four times a day (QID) | ORAL | Status: DC | PRN

## 2018-10-05 MED ORDER — LEVOTHYROXINE SODIUM 50 MCG PO TABS
75.0000 ug | ORAL_TABLET | Freq: Every day | ORAL | Status: DC
  Administered 2018-10-05: 75 ug via ORAL
  Filled 2018-10-05: qty 2

## 2018-10-05 MED ORDER — SODIUM CHLORIDE 0.9 % IV BOLUS (SEPSIS)
1000.0000 mL | Freq: Once | INTRAVENOUS | Status: AC
  Administered 2018-10-05: 1000 mL via INTRAVENOUS

## 2018-10-05 MED ORDER — POLYETHYLENE GLYCOL 3350 17 G PO PACK: 17.0000 g | PACK | Freq: Every day | ORAL | Status: DC | PRN

## 2018-10-05 MED ORDER — LATANOPROST 0.005 % OP SOLN
1.0000 [drp] | Freq: Every day | OPHTHALMIC | Status: DC
  Administered 2018-10-05: 1 [drp] via OPHTHALMIC
  Filled 2018-10-05: qty 2.5

## 2018-10-05 MED ORDER — BISACODYL 10 MG RE SUPP
10.0000 mg | Freq: Every day | RECTAL | Status: DC | PRN
  Administered 2018-10-05: 10 mg via RECTAL
  Filled 2018-10-05: qty 1

## 2018-10-05 MED ORDER — TIMOLOL MALEATE 0.5 % OP SOLN
1.0000 [drp] | Freq: Two times a day (BID) | OPHTHALMIC | Status: DC
  Administered 2018-10-05: 1 [drp] via OPHTHALMIC
  Filled 2018-10-05: qty 5

## 2018-10-05 MED ORDER — APIXABAN 2.5 MG PO TABS
2.5000 mg | ORAL_TABLET | Freq: Two times a day (BID) | ORAL | Status: DC
  Administered 2018-10-05: 2.5 mg via ORAL
  Filled 2018-10-05: qty 1

## 2018-10-05 MED ORDER — ATORVASTATIN CALCIUM 20 MG PO TABS
40.0000 mg | ORAL_TABLET | Freq: Every day | ORAL | Status: DC
  Administered 2018-10-05: 40 mg via ORAL
  Filled 2018-10-05: qty 2

## 2018-10-05 MED ORDER — CRANBERRY EXTRACT 250 MG PO TABS: ORAL_TABLET | Freq: Every day | ORAL | Status: DC

## 2018-10-05 MED ORDER — ENSURE ENLIVE PO LIQD: 237.0000 mL | Freq: Two times a day (BID) | ORAL | Status: DC

## 2018-10-05 MED ORDER — METRONIDAZOLE IN NACL 5-0.79 MG/ML-% IV SOLN
500.0000 mg | Freq: Once | INTRAVENOUS | Status: AC
  Administered 2018-10-05: 500 mg via INTRAVENOUS
  Filled 2018-10-05: qty 100

## 2018-10-05 MED ORDER — EZETIMIBE 10 MG PO TABS
10.0000 mg | ORAL_TABLET | Freq: Every day | ORAL | Status: DC
  Administered 2018-10-05: 10 mg via ORAL
  Filled 2018-10-05 (×2): qty 1

## 2018-10-05 MED ORDER — POTASSIUM CHLORIDE CRYS ER 10 MEQ PO TBCR
10.0000 meq | EXTENDED_RELEASE_TABLET | Freq: Two times a day (BID) | ORAL | Status: DC
  Administered 2018-10-05: 10 meq via ORAL
  Filled 2018-10-05: qty 1

## 2018-10-05 MED ORDER — OMEGA-3-6-9 PO CAPS: ORAL_CAPSULE | Freq: Two times a day (BID) | ORAL | Status: DC

## 2018-10-05 MED ORDER — CO Q10 200 MG PO CAPS: 1.0000 | ORAL_CAPSULE | Freq: Every day | ORAL | Status: DC

## 2018-10-05 MED ORDER — ADULT MULTIVITAMIN W/MINERALS CH
1.0000 | ORAL_TABLET | Freq: Every day | ORAL | Status: DC
  Administered 2018-10-05: 1 via ORAL
  Filled 2018-10-05: qty 1

## 2018-10-05 MED ORDER — ALPHA-LIPOIC ACID 200 MG PO CAPS: 1.0000 | ORAL_CAPSULE | Freq: Every day | ORAL | Status: DC

## 2018-10-05 MED ORDER — ISOSORBIDE MONONITRATE ER 30 MG PO TB24
60.0000 mg | ORAL_TABLET | Freq: Every day | ORAL | Status: DC
  Administered 2018-10-05: 60 mg via ORAL
  Filled 2018-10-05: qty 2

## 2018-10-05 MED ORDER — FERROUS GLUCONATE 324 (38 FE) MG PO TABS
324.0000 mg | ORAL_TABLET | Freq: Every day | ORAL | Status: DC
  Administered 2018-10-05: 324 mg via ORAL
  Filled 2018-10-05 (×2): qty 1

## 2018-10-05 MED ORDER — ONDANSETRON HCL 4 MG/2ML IJ SOLN: 4.0000 mg | Freq: Four times a day (QID) | INTRAMUSCULAR | Status: DC | PRN

## 2018-10-05 NOTE — Progress Notes (Signed)
PHARMACIST - PHYSICIAN ORDER COMMUNICATION  CONCERNING: P&T Medication Policy on Herbal Medications  DESCRIPTION:  This patient's order for:  Alpha-lipoic acid capsules, CoQ10 capsules, cranberry extract capsules, Omega 3-6-9 capsules, has been noted.  This product(s) is classified as an "herbal" or natural product. Due to a lack of definitive safety studies or FDA approval, nonstandard manufacturing practices, plus the potential risk of unknown drug-drug interactions while on inpatient medications, the Pharmacy and Therapeutics Committee does not permit the use of "herbal" or natural products of this type within [facility].   ACTION TAKEN: The pharmacy department is unable to verify this order at this time and your patient has been informed of this safety policy. Please reevaluate patient's clinical condition at discharge and address if the herbal or natural product(s) should be resumed at that time.

## 2018-10-05 NOTE — Progress Notes (Signed)
[facility] sepsis monitoring complete

## 2018-10-05 NOTE — Progress Notes (Signed)
Patient ID: Dennis Burns, male   DOB: 10/02/1928, 85 y.o.   MRN: [id number] spoke with vascular surgery on call Dr. Feliberto Gottron he reviewed the CT scan of the abdomen. Without contrast it is unsure if this is a pseudo-aneurysm. Patient otherwise is hemodynamically stable. His back pain according to the wife is chronic. Will keep an eye on him hydrate him and try do a CT angiogram of the abdomen tomorrow. Vascular surgery will see patient.

## 2018-10-05 NOTE — Consult Note (Signed)
CODE SEPSIS - PHARMACY COMMUNICATION  **Broad Spectrum Antibiotics should be administered within 1 hour of Sepsis diagnosis**  Time Code Sepsis Called/Page Received: 1305  Antibiotics Ordered: cefepime  Time of 1st antibiotic administration: 1335  Additional action taken by pharmacy: none  Oswald Hillock ,PharmD, BCPS Clinical Pharmacist  10/05/2018  1:20 PM

## 2018-10-05 NOTE — Progress Notes (Signed)
Patient ID: Dennis Burns, male   DOB: 06-19-1928, 85 y.o.   MRN: [id number] call back [facility] to find out status on me discussing with vascular surgeon. Transfer center rep told me they're waiting for the fascia to come from the hospital floor. Made sure RN/secretary sends it. Confirmed from unit secretary that they were able to fax the phase she had. Still waiting for [facility] transfer Center/ vascular surgeon to call me back.

## 2018-10-05 NOTE — Progress Notes (Signed)
Notified bedside nurse of need to draw lactic acid.  

## 2018-10-05 NOTE — Progress Notes (Signed)
Spoke with transport team at [facility] to notify them of transport needs as bed as now been accepted (Room 2216 at [facility]). They stated that they would call back when transport was on the way; however, they stated it could be early morning or day shift before transport team would arrive due to high transport volume at this time. Wife, Velva Harman, was updated by [employer].

## 2018-10-05 NOTE — Consult Note (Addendum)
Reason for Consult:Thoracic aortic pseudoaneurysm/aneurysm Referring Physician: Fritzi Mandes, MD  Dennis Burns is an 85 y.o. male.  HPI: Patient comes in to the ER with three day history of abdominal discomfort, chronic lower back pain, and anorexia. Denies any nausea or vomiting, fevers or chills.  Past Medical History:  Diagnosis Date  . Allergic rhinitis due to pollen    as child--better now  . Chronic kidney disease, stage III (moderate) (HCC)   . Coronary artery disease   . DVT, lower extremity, recurrent ([place])   . Glaucoma    [facility]   . History of prostate cancer 2004  . Hyperlipidemia   . Hypertension   . Hypothyroidism   . Impaired fasting glucose   . Prostate cancer ([place]) 2004   Rad seed implants  . Spinal stenosis of lumbar region with radiculopathy     Past Surgical History:  Procedure Laterality Date  . CAROTID ENDARTERECTOMY Right 09/2004   [place] GRAFT  2003   [place]   after fall  . INSERTION PROSTATE RADIATION SEED  2004   and RT  . POSTERIOR LUMBAR FUSION  2012, 2014  . RIGHT/LEFT HEART CATH AND CORONARY ANGIOGRAPHY N/A 02/14/2017   Procedure: RIGHT/LEFT HEART CATH AND CORONARY ANGIOGRAPHY;  Surgeon: Wellington Hampshire, MD;  Location: [facility];  Service: Cardiovascular;  Laterality: N/A;    Family History  Problem Relation Age of Onset  . Cancer Mother        colon (cause of death) and breast  . Glaucoma Mother   . Cancer Father        colon  . Stroke Maternal Grandfather   . Diabetes Neg Hx     Social History:  reports that he quit smoking about 42 years ago. He has a 4.50 pack-year smoking history. He has never used smokeless tobacco. He reports previous alcohol use. He reports that he does not use drugs.  Allergies: No Known Allergies  Medications: I have reviewed the patient's current medications.  Results for orders placed or performed during the hospital  encounter of 10/05/18 (from the past 48 hour(s))  Lactic acid, plasma     Status: None   Collection Time: 10/05/18  1:00 PM  Result Value Ref Range   Lactic Acid, Venous 1.9 0.5 - 1.9 mmol/L    Comment: Performed at [facility], [address] [place], [place] [address]  Comprehensive metabolic panel     Status: Abnormal   Collection Time: 10/05/18  1:00 PM  Result Value Ref Range   Sodium 138 135 - 145 mmol/L   Potassium 5.3 (H) 3.5 - 5.1 mmol/L   Chloride 106 98 - 111 mmol/L   CO2 23 22 - 32 mmol/L   Glucose, Bld 173 (H) 70 - 99 mg/dL   BUN 141 (H) 8 - 23 mg/dL    Comment: RESULT CONFIRMED BY MANUAL DILUTION/HKP   Creatinine, Ser 2.23 (H) 0.61 - 1.24 mg/dL   Calcium 9.0 8.9 - 10.3 mg/dL   Total Protein 7.2 6.5 - 8.1 g/dL   Albumin 2.8 (L) 3.5 - 5.0 g/dL   AST 50 (H) 15 - 41 U/L   ALT 56 (H) 0 - 44 U/L   Alkaline Phosphatase 88 38 - 126 U/L   Total Bilirubin 0.9 0.3 - 1.2 mg/dL   GFR calc non Af Amer 25 (L) >60 mL/min   GFR calc Af Wyvonnia Lora  29 (L) >60 mL/min   Anion gap 9 5 - 15    Comment: Performed at [facility], [address] [place], [place] [address]  CBC WITH DIFFERENTIAL     Status: Abnormal   Collection Time: 10/05/18  1:00 PM  Result Value Ref Range   WBC 10.7 (H) 4.0 - 10.5 K/uL   RBC 3.12 (L) 4.22 - 5.81 MIL/uL   Hemoglobin 8.6 (L) 13.0 - 17.0 g/dL   HCT 27.8 (L) 39.0 - 52.0 %   MCV 89.1 80.0 - 100.0 fL   MCH 27.6 26.0 - 34.0 pg   MCHC 30.9 30.0 - 36.0 g/dL   RDW 21.2 (H) 11.5 - 15.5 %   Platelets 214 150 - 400 K/uL   nRBC 0.0 0.0 - 0.2 %   Neutrophils Relative % 80 %   Neutro Abs 8.6 (H) 1.7 - 7.7 K/uL   Lymphocytes Relative 9 %   Lymphs Abs 1.0 0.7 - 4.0 K/uL   Monocytes Relative 10 %   Monocytes Absolute 1.1 (H) 0.1 - 1.0 K/uL   Eosinophils Relative 0 %   Eosinophils Absolute 0.0 0.0 - 0.5 K/uL   Basophils Relative 0 %   Basophils Absolute 0.0 0.0 - 0.1 K/uL   WBC Morphology MORPHOLOGY UNREMARKABLE    Smear Review Normal  platelet morphology    Immature Granulocytes 1 %   Abs Immature Granulocytes 0.08 (H) 0.00 - 0.07 K/uL   Polychromasia PRESENT     Comment: Performed at [facility], [address] [place], [place] [address]  APTT     Status: Abnormal   Collection Time: 10/05/18  1:00 PM  Result Value Ref Range   aPTT 40 (H) 24 - 36 seconds    Comment:        IF BASELINE aPTT IS ELEVATED, SUGGEST PATIENT RISK ASSESSMENT BE USED TO DETERMINE APPROPRIATE ANTICOAGULANT THERAPY. Performed at [facility], [address] [place], [place] [address]   Protime-INR     Status: Abnormal   Collection Time: 10/05/18  1:00 PM  Result Value Ref Range   Prothrombin Time 17.3 (H) 11.4 - 15.2 seconds   INR 1.4 (H) 0.8 - 1.2    Comment: (NOTE) INR goal varies based on device and disease states. Performed at [facility], [address] [place],  [address]   SARS Coronavirus 2 [facility] order, Performed in [facility] hospital lab) Nasopharyngeal Nasopharyngeal Swab     Status: None   Collection Time: 10/05/18  1:48 PM   Specimen: Nasopharyngeal Swab  Result Value Ref Range   SARS Coronavirus 2 NEGATIVE NEGATIVE    Comment: (NOTE) If result is NEGATIVE SARS-CoV-2 target nucleic acids are NOT DETECTED. The SARS-CoV-2 RNA is generally detectable in upper and lower  respiratory specimens during the acute phase of infection. The lowest  concentration of SARS-CoV-2 viral copies this assay can detect is 250  copies / mL. A negative result does not preclude SARS-CoV-2 infection  and should not be used as the sole basis for treatment or other  patient management decisions.  A negative result may occur with  improper specimen collection / handling, submission of specimen other  than nasopharyngeal swab, presence of viral mutation(s) within the  areas targeted by this assay, and inadequate number of viral copies  (<250 copies / mL). A negative result must be combined with  clinical  observations, patient history, and epidemiological information. If result is POSITIVE SARS-CoV-2 target nucleic acids are DETECTED. The  SARS-CoV-2 RNA is generally detectable in upper and lower  respiratory specimens dur ing the acute phase of infection.  Positive  results are indicative of active infection with SARS-CoV-2.  Clinical  correlation with patient history and other diagnostic information is  necessary to determine patient infection status.  Positive results do  not rule out bacterial infection or co-infection with other viruses. If result is PRESUMPTIVE POSTIVE SARS-CoV-2 nucleic acids MAY BE PRESENT.   A presumptive positive result was obtained on the submitted specimen  and confirmed on repeat testing.  While 2019 novel coronavirus  (SARS-CoV-2) nucleic acids may be present in the submitted sample  additional confirmatory testing may be necessary for epidemiological  and / or clinical management purposes  to differentiate between  SARS-CoV-2 and other Sarbecovirus currently known to infect humans.  If clinically indicated additional testing with an alternate test  methodology [phone]) is advised. The SARS-CoV-2 RNA is generally  detectable in upper and lower respiratory sp ecimens during the acute  phase of infection. The expected result is Negative. Fact Sheet for Patients:  StrictlyIdeas.no Fact Sheet for Healthcare Providers: BankingDealers.co.za This test is not yet approved or cleared by the [place] FDA and has been authorized for detection and/or diagnosis of SARS-CoV-2 by FDA under an Emergency Use Authorization (EUA).  This EUA will remain in effect (meaning this test can be used) for the duration of the COVID-19 declaration under Section 564(b)(1) of the Act, 21 U.S.C. section 360bbb-3(b)(1), unless the authorization is terminated or revoked sooner. Performed at [facility], [address] [place], [place] [address]   Urinalysis, Routine w reflex microscopic     Status: Abnormal   Collection Time: 10/05/18  2:25 PM  Result Value Ref Range   Color, Urine YELLOW (A) YELLOW   APPearance HAZY (A) CLEAR   Specific Gravity, Urine 1.016 1.005 - 1.030   pH 5.0 5.0 - 8.0   Glucose, UA NEGATIVE NEGATIVE mg/dL   Hgb urine dipstick NEGATIVE NEGATIVE   Bilirubin Urine NEGATIVE NEGATIVE   Ketones, ur NEGATIVE NEGATIVE mg/dL   Protein, ur 30 (A) NEGATIVE mg/dL   Nitrite NEGATIVE NEGATIVE   Leukocytes,Ua NEGATIVE NEGATIVE   RBC / HPF 0-5 0 - 5 RBC/hpf   WBC, UA 0-5 0 - 5 WBC/hpf   Bacteria, UA MANY (A) NONE SEEN   Squamous Epithelial / LPF NONE SEEN 0 - 5   Mucus PRESENT    Hyaline Casts, UA PRESENT     Comment: Performed at [facility], [address] [place], [place] [address]  CBC     Status: Abnormal   Collection Time: 10/05/18  2:25 PM  Result Value Ref Range   WBC 11.1 (H) 4.0 - 10.5 K/uL   RBC 3.06 (L) 4.22 - 5.81 MIL/uL   Hemoglobin 8.5 (L) 13.0 - 17.0 g/dL   HCT 27.9 (L) 39.0 - 52.0 %   MCV 91.2 80.0 - 100.0 fL   MCH 27.8 26.0 - 34.0 pg   MCHC 30.5 30.0 - 36.0 g/dL   RDW 21.2 (H) 11.5 - 15.5 %   Platelets 205 150 - 400 K/uL   nRBC 0.0 0.0 - 0.2 %    Comment: Performed at [facility], [address] [place], [place] [address]  Lactic acid, plasma     Status: None   Collection Time: 10/05/18  3:25 PM  Result Value Ref Range   Lactic Acid, Venous 1.6 0.5 - 1.9 mmol/L    Comment: Performed  at [facility], [place], [place] [address]    Ct Abdomen Pelvis Wo Contrast  Result Date: 10/05/2018 CLINICAL DATA:  Pt ems from [facility] for AMS x 1 week, decreased oral intake x 3-4 day, and fever (100.5 ax here) of unknown length. Pt poor historian. HX prostate cancer. EXAM: CT CHEST, ABDOMEN AND PELVIS WITHOUT CONTRAST TECHNIQUE: Multidetector CT imaging of the chest, abdomen and pelvis was performed  following the standard protocol without IV contrast. COMPARISON:  None. FINDINGS: CT CHEST FINDINGS Cardiovascular: Heart mildly enlarged. No pericardial effusion. Dense three-vessel coronary artery calcifications. Changes from prior CABG surgery. Great vessels normal in caliber. Aortic atherosclerotic calcifications extending to the arch branch vessels. Mediastinum/Nodes: No neck base, axillary, mediastinal or hilar masses or enlarged lymph nodes. Trachea and esophagus are unremarkable. Lungs/Pleura: Mild interstitial thickening most evident in the lung bases. Bronchial wall thickening, also in the lower lungs most evident in the lower lobes. Minimal pleural effusions. No lung consolidation. No pneumothorax. Musculoskeletal: No acute fracture. No osteoblastic or osteolytic lesions. CT ABDOMEN PELVIS FINDINGS Hepatobiliary: 7 mm low-density lesion, segment 7. Liver normal in size and attenuation. No other masses or lesions. Gallbladder is unremarkable. No bile duct dilation. Pancreas: Unremarkable. No pancreatic ductal dilatation or surrounding inflammatory changes. Spleen: Spleen normal in size. Peripheral calcifications. No masses. Adrenals/Urinary Tract: No adrenal masses. Marked left renal atrophy. Right kidney normal in size. No renal masses. Nonobstructing left intrarenal stones. No right intrarenal stones. No hydronephrosis. Ureters normal in course and in caliber. Bladder is unremarkable. Stomach/Bowel: Stomach is unremarkable. Small bowel and colon are normal in caliber. Small bowel loops anterior a right inguinal hernia without obstruction or signs of incarceration or strangulation. No wall thickening or inflammation. Moderate increased stool burden in the colon. There are numerous sigmoid diverticula without diverticulitis. Normal appendix visualized. Vascular/Lymphatic: There is mixed attenuation material extending beyond the intimal calcifications of the aorta at its hiatus, enlarging the transverse  diameter of the vessel 2 4.8 x 2.9 cm. This extends for a short distance from superior to inferior, approximately 3.3 cm. This is consistent with a focal aneurysm or, possibly, focal contain rupture or penetrating ulcer. The chronicity of this finding is unclear. Below this the aorta is normal in caliber with dense atherosclerotic calcifications, which extended into branch vessels. No enlarged lymph nodes. Reproductive: Radiation therapy seeds lie within the prostate and prostate bed. Other: No other abdominal wall hernia. Trace amount of ascites lies adjacent to the liver. Musculoskeletal: Chronic fracture of T12. There has been a long fusion with bilateral pedicle screws interconnecting rods extending from T12 through L5. L5 is a transitional lumbosacral vertebra. 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 No acute fracture.  No osteoblastic or osteolytic lesions. IMPRESSION: 1. Mild interstitial thickening in the lower lungs associated with mild cardiomegaly and minimal effusions. Suspect mild congestive heart failure. There is also bronchial wall thickening which may be due to edema or reflect bronchial inflammation. 2. Focal enlargement of the aorta at at its hiatus. This may reflect a focal aneurysm or a focal contained rupture/pseudoaneurysm. Penetrating ulcer is possible. Further assessment with CT angiography, if this patient can tolerate contrast, is recommended. 3. Trace amount of ascites, nonspecific. 4. Marked left renal atrophy. 5. Extensive aortic atherosclerotic disease. 6. Right inguinal hernia containing small bowel, without obstruction, incarceration or strangulation. Electronically Signed   By: Lajean Manes M.D.   On: 10/05/2018 15:12   Ct Head Wo  Contrast  Result Date: 10/05/2018 CLINICAL DATA:  Pt ems from [facility] for AMS x 1 week, decreased oral intake x 3-4 day, and fever (100.5 ax here) of unknown length. Pt poor  historian. HX prostate cancer. EXAM: CT HEAD WITHOUT CONTRAST TECHNIQUE: Contiguous axial images were obtained from the base of the skull through the vertex without intravenous contrast. COMPARISON:  None. FINDINGS: Brain: No acute infarct or intracranial hemorrhage. The ventricles are normal in configuration. There is age appropriate mild ventricular and sulcal enlargement. No parenchymal masses. Mild periventricular white matter hypoattenuation noted consistent with chronic microvascular ischemic change. CSF density extra-axial mass indents the lateral right frontal lobe, measuring 4.3 x 2.4 x 5 cm, consistent with an arachnoid cyst. No other extra-axial abnormalities. No intracranial hemorrhage. Vascular: No hyperdense vessel or unexpected calcification. Skull: Normal. Negative for fracture or focal lesion. Sinuses/Orbits: Visualized globes and orbits are unremarkable. The visualized sinuses and mastoid air cells are clear. Other: None. IMPRESSION: 1. No acute intracranial abnormalities. 2. Age-appropriate volume loss. Mild chronic microvascular ischemic change. Chronic right arachnoid cyst. Electronically Signed   By: Lajean Manes M.D.   On: 10/05/2018 14:56   Ct Chest Wo Contrast  Result Date: 10/05/2018 CLINICAL DATA:  Pt ems from [facility] for AMS x 1 week, decreased oral intake x 3-4 day, and fever (100.5 ax here) of unknown length. Pt poor historian. HX prostate cancer. EXAM: CT CHEST, ABDOMEN AND PELVIS WITHOUT CONTRAST TECHNIQUE: Multidetector CT imaging of the chest, abdomen and pelvis was performed following the standard protocol without IV contrast. COMPARISON:  None. FINDINGS: CT CHEST FINDINGS Cardiovascular: Heart mildly enlarged. No pericardial effusion. Dense three-vessel coronary artery calcifications. Changes from prior CABG surgery. Great vessels normal in caliber. Aortic atherosclerotic calcifications extending to the arch branch vessels. Mediastinum/Nodes: No neck  base, axillary, mediastinal or hilar masses or enlarged lymph nodes. Trachea and esophagus are unremarkable. Lungs/Pleura: Mild interstitial thickening most evident in the lung bases. Bronchial wall thickening, also in the lower lungs most evident in the lower lobes. Minimal pleural effusions. No lung consolidation. No pneumothorax. Musculoskeletal: No acute fracture. No osteoblastic or osteolytic lesions. CT ABDOMEN PELVIS FINDINGS Hepatobiliary: 7 mm low-density lesion, segment 7. Liver normal in size and attenuation. No other masses or lesions. Gallbladder is unremarkable. No bile duct dilation. Pancreas: Unremarkable. No pancreatic ductal dilatation or surrounding inflammatory changes. Spleen: Spleen normal in size. Peripheral calcifications. No masses. Adrenals/Urinary Tract: No adrenal masses. Marked left renal atrophy. Right kidney normal in size. No renal masses. Nonobstructing left intrarenal stones. No right intrarenal stones. No hydronephrosis. Ureters normal in course and in caliber. Bladder is unremarkable. Stomach/Bowel: Stomach is unremarkable. Small bowel and colon are normal in caliber. Small bowel loops anterior a right inguinal hernia without obstruction or signs of incarceration or strangulation. No wall thickening or inflammation. Moderate increased stool burden in the colon. There are numerous sigmoid diverticula without diverticulitis. Normal appendix visualized. Vascular/Lymphatic: There is mixed attenuation material extending beyond the intimal calcifications of the aorta at its hiatus, enlarging the transverse diameter of the vessel 2 4.8 x 2.9 cm. This extends for a short distance from superior to inferior, approximately 3.3 cm. This is consistent with a focal aneurysm or, possibly, focal contain rupture or penetrating ulcer. The chronicity of this finding is unclear. Below this the aorta is normal in caliber with dense atherosclerotic calcifications, which extended into branch vessels. No  enlarged lymph nodes. Reproductive: Radiation therapy seeds lie within the prostate and  prostate bed. Other: No other abdominal wall hernia. Trace amount of ascites lies adjacent to the liver. Musculoskeletal: Chronic fracture of T12. There has been a long fusion with bilateral pedicle screws interconnecting rods extending from T12 through L5. L5 is a transitional lumbosacral vertebra. 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 No acute fracture.  No osteoblastic or osteolytic lesions. IMPRESSION: 1. Mild interstitial thickening in the lower lungs associated with mild cardiomegaly and minimal effusions. Suspect mild congestive heart failure. There is also bronchial wall thickening which may be due to edema or reflect bronchial inflammation. 2. Focal enlargement of the aorta at at its hiatus. This may reflect a focal aneurysm or a focal contained rupture/pseudoaneurysm. Penetrating ulcer is possible. Further assessment with CT angiography, if this patient can tolerate contrast, is recommended. 3. Trace amount of ascites, nonspecific. 4. Marked left renal atrophy. 5. Extensive aortic atherosclerotic disease. 6. Right inguinal hernia containing small bowel, without obstruction, incarceration or strangulation. Electronically Signed   By: Lajean Manes M.D.   On: 10/05/2018 15:12   Dg Chest Port 1 View  Result Date: 10/05/2018 CLINICAL DATA:  Fever and weakness EXAM: PORTABLE CHEST 1 VIEW COMPARISON:  May 06, 2017 FINDINGS: There is interstitial edema with a slight degree of interstitial edema. No consolidation. There is cardiomegaly with mild pulmonary venous hypertension. No adenopathy. Patient is status post median sternotomy. There is aortic atherosclerosis. There is also calcification in the carotid arteries bilaterally. There is postoperative change in the right neck region. There is postoperative change in the upper lumbar region. IMPRESSION: There is a degree of  pulmonary vascular congestion with slight interstitial edema. No frank consolidation. There is postoperative change in the right neck region. Aortic Atherosclerosis (ICD10-I70.0). There is carotid artery calcification bilaterally. Electronically Signed   By: Lowella Grip III M.D.   On: 10/05/2018 13:43    Review of Systems  Constitutional: Negative.   HENT: Positive for hearing loss.   Eyes: Negative.   Respiratory: Negative.   Cardiovascular: Positive for leg swelling.  Gastrointestinal: Positive for constipation.  Genitourinary: Negative.   Musculoskeletal: Positive for back pain.  Skin: Negative.   Neurological: Negative.   Endo/Heme/Allergies: Negative.   Psychiatric/Behavioral: Negative.   All other systems reviewed and are negative.  Blood pressure (!) 108/57, pulse 95, temperature 98.6 F (37 C), temperature source Oral, resp. rate 20, height 5\' 3"  (1.6 m), weight 63.5 kg, SpO2 98 %. Physical Exam  Constitutional: He is oriented to person, place, and time. He appears well-developed.  HENT:  Head: Normocephalic and atraumatic.  Eyes: Pupils are equal, round, and reactive to light. Conjunctivae and EOM are normal.  Neck: Normal range of motion. Neck supple.  Cardiovascular: Normal rate and regular rhythm.  Murmur heard. Respiratory: Effort normal and breath sounds normal.  GI: Soft. Bowel sounds are normal.  Musculoskeletal: Normal range of motion.  Neurological: He is alert and oriented to person, place, and time. He has normal reflexes.  Skin: Skin is warm.  Psychiatric: He has a normal mood and affect. His behavior is normal. Judgment and thought content normal.    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   Will need CTA to assess further, however recommend transfer to facility that does endovascular thoracic stenting for further treatment.  He is hemodynamically  stable and aneurysm is small. Back pain is concerning with the presence of this finding.  I will follow until he  is transferred.   None obstructive right inguinal hernia is also noted on CT with small bowel present. This will need to be addressed by general surgery.  Elmore Guise 10/05/2018, 9:21 PM

## 2018-10-05 NOTE — H&P (Signed)
Dennis Burns    MR#:  [id number]  DATE OF BIRTH:  09/11/1928  DATE OF ADMISSION:  10/05/2018  PRIMARY CARE PHYSICIAN: [name], MD   REQUESTING/REFERRING PHYSICIAN: Dr. Joni Fears  CHIEF COMPLAINT:   Generalized weakness, increasing confusion, poor PO intake for one week and low-grade fever today.  It is hard on hearing. Did not have her is hearing aid. History is obtained from patient's wife was in the ER HISTORY OF PRESENT ILLNESS:  Dennis Burns  is a 85 y.o. male with a known history of chronic kidney disease stage III creatinine around 1.6, DVT recurrent on oral anticoagulation, glaucoma, CAD, lower extremity edema who follows with Dr. [name] as cardiology recently changed to torsemide 40 mg BID for leg edema comes into the emergency room with increasing confusion, weakness and low-grade fever of 100.6.  Per patient's wife he has not been eating and drinking well for past few days. Becoming weak and staying in bed most of the time. No cough dysuria or abdominal pain. Denies any constipation chest pain. Patient has chronic back pain.  Denies any nausea or vomiting. In the ER patient had a fever of 100.6. His white count is normal. He received empiric antibiotic with IV Flagyl and cefepime.  he was found to have elevated creatinine of 2.23. He is being admitted with acute on chronic renal failure with low-grade fever-- no infection source identified reasons so far.   PAST MEDICAL HISTORY:   Past Medical History:  Diagnosis Date  . Allergic rhinitis due to pollen    as child--better now  . Chronic kidney disease, stage III (moderate) (HCC)   . Coronary artery disease   . DVT, lower extremity, recurrent ([place])   . Glaucoma    [facility]   . History of prostate cancer 2004  . Hyperlipidemia   . Hypertension   . Hypothyroidism   . Impaired fasting glucose   . Prostate cancer ([place]) 2004   Rad seed implants  . Spinal stenosis of lumbar region with radiculopathy     PAST SURGICAL HISTOIRY:   Past Surgical History:  Procedure Laterality Date  . CAROTID ENDARTERECTOMY Right 09/2004   [place] GRAFT  2003   [place]   after fall  . INSERTION PROSTATE RADIATION SEED  2004   and RT  . POSTERIOR LUMBAR FUSION  2012, 2014  . RIGHT/LEFT HEART CATH AND CORONARY ANGIOGRAPHY N/A 02/14/2017   Procedure: RIGHT/LEFT HEART CATH AND CORONARY ANGIOGRAPHY;  Surgeon: Wellington Hampshire, MD;  Location: [facility];  Service: Cardiovascular;  Laterality: N/A;    SOCIAL HISTORY:   Social History   Tobacco Use  . Smoking status: Former Smoker    Packs/day: 0.25    Years: 18.00    Pack years: 4.50    Quit date: 1978    Years since quitting: 42.6  . Smokeless tobacco: Never Used  . Tobacco comment: Quit in 1978  Substance Use Topics  . Alcohol use: Yes    FAMILY HISTORY:   Family History  Problem Relation Age of Onset  . Cancer Mother        colon (cause of death) and breast  . Glaucoma Mother   . Cancer Father        colon  . Stroke Maternal Grandfather   . Diabetes Neg Hx  DRUG ALLERGIES:  No Known Allergies  REVIEW OF SYSTEMS:  Review of Systems  Constitutional: Positive for fever and malaise/fatigue. Negative for chills and weight loss.  HENT: Negative for ear discharge, ear pain and nosebleeds.   Eyes: Negative for blurred vision, pain and discharge.  Respiratory: Negative for sputum production, shortness of breath, wheezing and stridor.   Cardiovascular: Negative for chest pain, palpitations, orthopnea and PND.  Gastrointestinal: Negative for abdominal pain, diarrhea, nausea and vomiting.  Genitourinary: Negative for frequency and urgency.  Musculoskeletal: Positive for back pain. Negative for joint pain.  Neurological: Positive for weakness. Negative for sensory change, speech change  and focal weakness.  Psychiatric/Behavioral: Negative for depression and hallucinations. The patient is not nervous/anxious.      MEDICATIONS AT HOME:   Prior to Admission medications   Medication Sig Start Date End Date Taking? Authorizing Provider  Alpha-Lipoic Acid 200 MG CAPS Take 1 capsule by mouth daily.   Yes [provider]  apixaban (ELIQUIS) 2.5 MG TABS tablet Take 1 tablet (2.5 mg total) by mouth 2 (two) times daily. 05/07/18  Yes Minna Merritts, MD  atorvastatin (LIPITOR) 40 MG tablet Take 1 tablet (40 mg total) by mouth daily. 03/11/18  Yes Dunn, Areta Haber, PA-C  CALCIUM CITRATE PO Take 1 tablet by mouth daily. Takes 300mg  daily.   Yes [provider]  carvedilol (COREG) 12.5 MG tablet Take 0.5 tablets (6.25 mg total) by mouth 2 (two) times daily with a meal. 02/21/18  Yes Dunn, Areta Haber, PA-C  Cholecalciferol (VITAMIN D) 2000 units CAPS Take 4,000 Units by mouth daily.   Yes [provider]  Coenzyme Q10 (CO Q10) 200 MG CAPS Take 1 capsule by mouth daily.   Yes [provider]  CRANBERRY EXTRACT PO Take 1 tablet by mouth daily. Takes 1500mg  daily   Yes [provider]  ezetimibe (ZETIA) 10 MG tablet TAKE 1 TABLET EVERY DAY 02/18/18  Yes Gollan, Kathlene November, MD  Ferrous Gluconate (IRON 27 PO) Take by mouth daily.   Yes [provider]  hydrALAZINE (APRESOLINE) 10 MG tablet TAKE 1 TABLET THREE TIMES DAILY 03/11/18  Yes [name], MD  isosorbide mononitrate (IMDUR) 60 MG 24 hr tablet Take 1 tablet (60 mg total) by mouth daily. 06/24/18 12/21/18 Yes Gollan, Kathlene November, MD  latanoprost (XALATAN) 0.005 % ophthalmic solution Place 1 drop into both eyes at bedtime. In morning 10/27/15  Yes [provider]  levothyroxine (SYNTHROID) 75 MCG tablet Take 1 tablet (75 mcg total) by mouth daily. 09/04/18  Yes [name], MD  Multiple Vitamin (MULTIVITAMIN) tablet Take 1 tablet by mouth daily.   Yes [provider]  Omega  3-6-9 Fatty Acids (OMEGA-3-6-9 PO) Take 1 capsule by mouth 2 (two) times daily.   Yes [provider]  oxyCODONE-acetaminophen (PERCOCET) 7.5-325 MG tablet Take 1 tablet by mouth every 6 (six) hours as needed for pain.   Yes [provider]  potassium chloride (K-DUR) 10 MEQ tablet Take 1 tablet (10 mEq total) by mouth as directed. Take 3 times daily and 4 pills when taking metolazone Patient taking differently: Take 10 mEq by mouth 2 (two) times daily. Take 3 times daily and 4 pills when taking metolazone 07/10/17  Yes Gollan, Kathlene November, MD  sacubitril-valsartan (ENTRESTO) 49-51 MG Take 1 tablet by mouth 2 (two) times daily. 07/22/18  Yes Gollan, Kathlene November, MD  timolol (BETIMOL) 0.5 % ophthalmic solution Place 1 drop into both eyes 2 (two)  times daily.   Yes [provider]  torsemide (DEMADEX) 20 MG tablet Take 2 tablets (40 mg total) by mouth 2 (two) times daily. 09/02/18 12/01/18 Yes Gollan, Kathlene November, MD  diclofenac sodium (VOLTAREN) 1 % GEL Apply 2 g topically 4 (four) times daily.    [provider]  feeding supplement, ENSURE ENLIVE, (ENSURE ENLIVE) LIQD Take 237 mLs by mouth 2 (two) times daily between meals. 05/10/17   Bettey Costa, MD  metolazone (ZAROXOLYN) 2.5 MG tablet Take 1 tablet (2.5 mg total) by mouth daily as needed. 08/06/18 11/04/18  Minna Merritts, MD  nitroGLYCERIN (NITROSTAT) 0.4 MG SL tablet Place 1 tablet (0.4 mg total) under the tongue every 5 (five) minutes as needed for chest pain. 05/10/17   Bettey Costa, MD  traMADol (ULTRAM) 50 MG tablet Take 2 tablets (100 mg total) by mouth 3 (three) times daily as needed. 05/13/18   [name], MD      VITAL SIGNS:  Blood pressure 112/60, pulse (!) 103, temperature (!) 100.8 F (38.2 C), temperature source Oral, resp. rate 14, height 5\' 3"  (1.6 m), weight 63.5 kg, SpO2 100 %.  PHYSICAL EXAMINATION:  GENERAL:  85 y.o.-year-old patient lying in the bed with no acute distress. Hard on hearing EYES:  Pupils equal, round, reactive to light and accommodation. No scleral icterus. Extraocular muscles intact.  HEENT: Head atraumatic, normocephalic. Oropharynx and nasopharynx clear. Somewhat dry NECK:  Supple, no jugular venous distention. No thyroid enlargement, no tenderness.  LUNGS: Normal breath sounds bilaterally, no wheezing, rales,rhonchi or crepitation. No use of accessory muscles of respiration.  CARDIOVASCULAR: S1, S2 normal. No murmurs, rubs, or gallops.  ABDOMEN: Soft, nontender, nondistended. Bowel sounds present. No organomegaly or mass.  EXTREMITIES: No pedal edema, cyanosis, or clubbing.  NEUROLOGIC: Cranial nerves II through XII are intact. Muscle strength 4+/5 in all extremities. Sensation intact. Gait not checked.  PSYCHIATRIC: The patient is alert and oriented x 2. Mild confusion SKIN: No obvious rash, lesion, or ulcer.   LABORATORY PANEL:   CBC Recent Labs  Lab 10/05/18 1425  WBC 11.1*  HGB 8.5*  HCT 27.9*  PLT 205   ------------------------------------------------------------------------------------------------------------------  Chemistries  Recent Labs  Lab 10/05/18 1300  NA 138  K 5.3*  CL 106  CO2 23  GLUCOSE 173*  BUN 141*  CREATININE 2.23*  CALCIUM 9.0  AST 50*  ALT 56*  ALKPHOS 88  BILITOT 0.9   ------------------------------------------------------------------------------------------------------------------  Cardiac Enzymes No results for input(s): TROPONINI in the last 168 hours. ------------------------------------------------------------------------------------------------------------------  RADIOLOGY:  Ct Abdomen Pelvis Wo Contrast  Result Date: 10/05/2018 CLINICAL DATA:  Pt ems from [facility] for AMS x 1 week, decreased oral intake x 3-4 day, and fever (100.5 ax here) of unknown length. Pt poor historian. HX prostate cancer. EXAM: CT CHEST, ABDOMEN AND PELVIS WITHOUT CONTRAST TECHNIQUE: Multidetector CT imaging of  the chest, abdomen and pelvis was performed following the standard protocol without IV contrast. COMPARISON:  None. FINDINGS: CT CHEST FINDINGS Cardiovascular: Heart mildly enlarged. No pericardial effusion. Dense three-vessel coronary artery calcifications. Changes from prior CABG surgery. Great vessels normal in caliber. Aortic atherosclerotic calcifications extending to the arch branch vessels. Mediastinum/Nodes: No neck base, axillary, mediastinal or hilar masses or enlarged lymph nodes. Trachea and esophagus are unremarkable. Lungs/Pleura: Mild interstitial thickening most evident in the lung bases. Bronchial wall thickening, also in the lower lungs most evident in the lower lobes. Minimal pleural effusions. No lung consolidation. No pneumothorax. Musculoskeletal: No acute  fracture. No osteoblastic or osteolytic lesions. CT ABDOMEN PELVIS FINDINGS Hepatobiliary: 7 mm low-density lesion, segment 7. Liver normal in size and attenuation. No other masses or lesions. Gallbladder is unremarkable. No bile duct dilation. Pancreas: Unremarkable. No pancreatic ductal dilatation or surrounding inflammatory changes. Spleen: Spleen normal in size. Peripheral calcifications. No masses. Adrenals/Urinary Tract: No adrenal masses. Marked left renal atrophy. Right kidney normal in size. No renal masses. Nonobstructing left intrarenal stones. No right intrarenal stones. No hydronephrosis. Ureters normal in course and in caliber. Bladder is unremarkable. Stomach/Bowel: Stomach is unremarkable. Small bowel and colon are normal in caliber. Small bowel loops anterior a right inguinal hernia without obstruction or signs of incarceration or strangulation. No wall thickening or inflammation. Moderate increased stool burden in the colon. There are numerous sigmoid diverticula without diverticulitis. Normal appendix visualized. Vascular/Lymphatic: There is mixed attenuation material extending beyond the intimal calcifications of the aorta  at its hiatus, enlarging the transverse diameter of the vessel 2 4.8 x 2.9 cm. This extends for a short distance from superior to inferior, approximately 3.3 cm. This is consistent with a focal aneurysm or, possibly, focal contain rupture or penetrating ulcer. The chronicity of this finding is unclear. Below this the aorta is normal in caliber with dense atherosclerotic calcifications, which extended into branch vessels. No enlarged lymph nodes. Reproductive: Radiation therapy seeds lie within the prostate and prostate bed. Other: No other abdominal wall hernia. Trace amount of ascites lies adjacent to the liver. Musculoskeletal: Chronic fracture of T12. There has been a long fusion with bilateral pedicle screws interconnecting rods extending from T12 through L5. L5 is a transitional lumbosacral vertebra. 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 No acute fracture.  No osteoblastic or osteolytic lesions. IMPRESSION: 1. Mild interstitial thickening in the lower lungs associated with mild cardiomegaly and minimal effusions. Suspect mild congestive heart failure. There is also bronchial wall thickening which may be due to edema or reflect bronchial inflammation. 2. Focal enlargement of the aorta at at its hiatus. This may reflect a focal aneurysm or a focal contained rupture/pseudoaneurysm. Penetrating ulcer is possible. Further assessment with CT angiography, if this patient can tolerate contrast, is recommended. 3. Trace amount of ascites, nonspecific. 4. Marked left renal atrophy. 5. Extensive aortic atherosclerotic disease. 6. Right inguinal hernia containing small bowel, without obstruction, incarceration or strangulation. Electronically Signed   By: Lajean Manes M.D.   On: 10/05/2018 15:12   Ct Head Wo Contrast  Result Date: 10/05/2018 CLINICAL DATA:  Pt ems from [facility] for AMS x 1 week, decreased oral intake x 3-4 day, and fever (100.5 ax  here) of unknown length. Pt poor historian. HX prostate cancer. EXAM: CT HEAD WITHOUT CONTRAST TECHNIQUE: Contiguous axial images were obtained from the base of the skull through the vertex without intravenous contrast. COMPARISON:  None. FINDINGS: Brain: No acute infarct or intracranial hemorrhage. The ventricles are normal in configuration. There is age appropriate mild ventricular and sulcal enlargement. No parenchymal masses. Mild periventricular white matter hypoattenuation noted consistent with chronic microvascular ischemic change. CSF density extra-axial mass indents the lateral right frontal lobe, measuring 4.3 x 2.4 x 5 cm, consistent with an arachnoid cyst. No other extra-axial abnormalities. No intracranial hemorrhage. Vascular: No hyperdense vessel or unexpected calcification. Skull: Normal. Negative for fracture or focal lesion. Sinuses/Orbits: Visualized globes and orbits are unremarkable. The visualized sinuses and mastoid air cells are clear. Other: None. IMPRESSION: 1. No acute intracranial abnormalities. 2. Age-appropriate volume loss.  Mild chronic microvascular ischemic change. Chronic right arachnoid cyst. Electronically Signed   By: Lajean Manes M.D.   On: 10/05/2018 14:56   Ct Chest Wo Contrast  Result Date: 10/05/2018 CLINICAL DATA:  Pt ems from [facility] for AMS x 1 week, decreased oral intake x 3-4 day, and fever (100.5 ax here) of unknown length. Pt poor historian. HX prostate cancer. EXAM: CT CHEST, ABDOMEN AND PELVIS WITHOUT CONTRAST TECHNIQUE: Multidetector CT imaging of the chest, abdomen and pelvis was performed following the standard protocol without IV contrast. COMPARISON:  None. FINDINGS: CT CHEST FINDINGS Cardiovascular: Heart mildly enlarged. No pericardial effusion. Dense three-vessel coronary artery calcifications. Changes from prior CABG surgery. Great vessels normal in caliber. Aortic atherosclerotic calcifications extending to the arch branch  vessels. Mediastinum/Nodes: No neck base, axillary, mediastinal or hilar masses or enlarged lymph nodes. Trachea and esophagus are unremarkable. Lungs/Pleura: Mild interstitial thickening most evident in the lung bases. Bronchial wall thickening, also in the lower lungs most evident in the lower lobes. Minimal pleural effusions. No lung consolidation. No pneumothorax. Musculoskeletal: No acute fracture. No osteoblastic or osteolytic lesions. CT ABDOMEN PELVIS FINDINGS Hepatobiliary: 7 mm low-density lesion, segment 7. Liver normal in size and attenuation. No other masses or lesions. Gallbladder is unremarkable. No bile duct dilation. Pancreas: Unremarkable. No pancreatic ductal dilatation or surrounding inflammatory changes. Spleen: Spleen normal in size. Peripheral calcifications. No masses. Adrenals/Urinary Tract: No adrenal masses. Marked left renal atrophy. Right kidney normal in size. No renal masses. Nonobstructing left intrarenal stones. No right intrarenal stones. No hydronephrosis. Ureters normal in course and in caliber. Bladder is unremarkable. Stomach/Bowel: Stomach is unremarkable. Small bowel and colon are normal in caliber. Small bowel loops anterior a right inguinal hernia without obstruction or signs of incarceration or strangulation. No wall thickening or inflammation. Moderate increased stool burden in the colon. There are numerous sigmoid diverticula without diverticulitis. Normal appendix visualized. Vascular/Lymphatic: There is mixed attenuation material extending beyond the intimal calcifications of the aorta at its hiatus, enlarging the transverse diameter of the vessel 2 4.8 x 2.9 cm. This extends for a short distance from superior to inferior, approximately 3.3 cm. This is consistent with a focal aneurysm or, possibly, focal contain rupture or penetrating ulcer. The chronicity of this finding is unclear. Below this the aorta is normal in caliber with dense atherosclerotic calcifications,  which extended into branch vessels. No enlarged lymph nodes. Reproductive: Radiation therapy seeds lie within the prostate and prostate bed. Other: No other abdominal wall hernia. Trace amount of ascites lies adjacent to the liver. Musculoskeletal: Chronic fracture of T12. There has been a long fusion with bilateral pedicle screws interconnecting rods extending from T12 through L5. L5 is a transitional lumbosacral vertebra. 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 No acute fracture.  No osteoblastic or osteolytic lesions. IMPRESSION: 1. Mild interstitial thickening in the lower lungs associated with mild cardiomegaly and minimal effusions. Suspect mild congestive heart failure. There is also bronchial wall thickening which may be due to edema or reflect bronchial inflammation. 2. Focal enlargement of the aorta at at its hiatus. This may reflect a focal aneurysm or a focal contained rupture/pseudoaneurysm. Penetrating ulcer is possible. Further assessment with CT angiography, if this patient can tolerate contrast, is recommended. 3. Trace amount of ascites, nonspecific. 4. Marked left renal atrophy. 5. Extensive aortic atherosclerotic disease. 6. Right inguinal hernia containing small bowel, without obstruction, incarceration or strangulation. Electronically Signed   By: Dedra Skeens.D.  On: 10/05/2018 15:12   Dg Chest Port 1 View  Result Date: 10/05/2018 CLINICAL DATA:  Fever and weakness EXAM: PORTABLE CHEST 1 VIEW COMPARISON:  May 06, 2017 FINDINGS: There is interstitial edema with a slight degree of interstitial edema. No consolidation. There is cardiomegaly with mild pulmonary venous hypertension. No adenopathy. Patient is status post median sternotomy. There is aortic atherosclerosis. There is also calcification in the carotid arteries bilaterally. There is postoperative change in the right neck region. There is postoperative change in the upper lumbar  region. IMPRESSION: There is a degree of pulmonary vascular congestion with slight interstitial edema. No frank consolidation. There is postoperative change in the right neck region. Aortic Atherosclerosis (ICD10-I70.0). There is carotid artery calcification bilaterally. Electronically Signed   By: Lowella Grip III M.D.   On: 10/05/2018 13:43    EKG:    IMPRESSION AND PLAN:   Dennis Burns  is a 85 y.o. male with a known history of chronic kidney disease stage III creatinine around 1.6, DVT recurrent on oral anticoagulation, glaucoma, CAD, lower extremity edema who follows with Dr. [name] as cardiology recently changed to torsemide 40 mg BID for leg edema comes into the emergency room with increasing confusion, weakness and low-grade fever of 100.6.  1. Acute on chronic renal failure-- stage III appears due to dehydration along with recent change in patients diuretic to torsemide with poor PO intake -baseline creatinine 1.2-- 1.6 -comes in with creatinine of 2.23 and appears clinically dehydrated -cautious IV fluids -monitor input output, encouraged PO fluid intake -nephrology consultation if needed -hold nephrotoxic agents  2. Febrile illness-- came in with low-grade fever and weakness -no source of infection identified so far -patient received IV antibiotic empirically in the ER. I will hold off further dosing until source identified. Follow blood culture urine culture -monitor fever curve -COVID results pending  3. Chronic anticoagulation due to DVT -continue eliquis  4. Relative hypotension -hold PO anti-blood pressure meds -getting IV fluids  5. Hypothyroidism  -continue Synthroid  6. DVT prophylaxis on eliquis  7. Abnormal CT abdomen regarding questionable aortic aneurysm focal -I have messaged vascular surgery on call Dr Dianna Rossetti regarding reviewing the CT scan of the abdomen. Awaiting input.  Was discussed with patient's wife in the ER.    All the records are  reviewed and case discussed with ED provider.   CODE STATUS: FULL code  TOTAL TIME TAKING CARE OF THIS PATIENT: 45 minutes.    Fritzi Mandes M.D on 10/05/2018 at 3:32 PM  Between 7am to 6pm - Pager - [phone]  After 6pm go to [email or web address] - password [email or web address]  [facility]  Office  [phone]  CC: Primary care physician; [name], MD

## 2018-10-05 NOTE — Progress Notes (Signed)
Patient ID: Dennis Burns, male   DOB: [birth year], 85 y.o.   MRN: [id number] spoke with Dr. Gwenevere Abbot vascular surgery. Dr. Tobie Poet would like to see the imaging studies. I have called [facility] radiology to Smoot CT scan abdominal results with [facility].

## 2018-10-05 NOTE — Consult Note (Signed)
PHARMACY -  BRIEF ANTIBIOTIC NOTE   Pharmacy has received consult(s) for intra-abdominal infection from an ED provider.  The patient's profile has been reviewed for ht/wt/allergies/indication/available labs.    One time order(s) placed for cefepime + flagyl   Further antibiotics/pharmacy consults should be ordered by admitting physician if indicated.                       Thank you, Oswald Hillock 10/05/2018  1:18 PM

## 2018-10-05 NOTE — ED Notes (Signed)
ED TO INPATIENT HANDOFF REPORT  ED Nurse Name and Phone #: Bill [phone]  S Name/Age/Gender Dennis Burns 85 y.o. male Room/Bed: ED18A/ED18A  Code Status   Code Status: Prior  Home/SNF/Other Home Patient oriented to: self and place Is this baseline? No   Triage Complete: Triage complete  Chief Complaint Altered Mental Status; Fever  Triage Note Pt ems from [facility] for AMS x 1 week, decreased oral intake x 3-4 day, and fever (100.5 ax here) of unknown length. Pt poor historian.   Allergies No Known Allergies  Level of Care/Admitting Diagnosis ED Disposition    ED Disposition Condition [facility] Area: [place] [[id number]]  Level of Care: Med-Surg [16]  Covid Evaluation: Asymptomatic Screening Protocol (No Symptoms)  Diagnosis: Acute on chronic renal failure [facility]) [[id number]]  Admitting Physician: Odessa Fleming  Attending Physician: Odessa Fleming  Estimated length of stay: past midnight tomorrow  Certification:: I certify this patient will need inpatient services for at least 2 midnights  PT Class (Do Not Modify): Inpatient [101]  PT Acc Code (Do Not Modify): Private [1]       B Medical/Surgery History Past Medical History:  Diagnosis Date  . Allergic rhinitis due to pollen    as child--better now  . Chronic kidney disease, stage III (moderate) (HCC)   . Coronary artery disease   . DVT, lower extremity, recurrent ([place])   . Glaucoma    [facility]   . History of prostate cancer 2004  . Hyperlipidemia   . Hypertension   . Hypothyroidism   . Impaired fasting glucose   . Prostate cancer ([place]) 2004   Rad seed implants  . Spinal stenosis of lumbar region with radiculopathy    Past Surgical History:  Procedure Laterality Date  . CAROTID ENDARTERECTOMY Right 09/2004   [place] GRAFT  2003   [place]   after fall  . INSERTION  PROSTATE RADIATION SEED  2004   and RT  . POSTERIOR LUMBAR FUSION  2012, 2014  . RIGHT/LEFT HEART CATH AND CORONARY ANGIOGRAPHY N/A 02/14/2017   Procedure: RIGHT/LEFT HEART CATH AND CORONARY ANGIOGRAPHY;  Surgeon: Wellington Hampshire, MD;  Location: [place] [facility];  Service: Cardiovascular;  Laterality: N/A;     A IV Location/Drains/Wounds Patient Lines/Drains/Airways Status   Active Line/Drains/Airways    Name:   Placement date:   Placement time:   Site:   Days:   Peripheral IV 10/05/18 Left Antecubital   10/05/18    1248    Antecubital   less than 1   Peripheral IV 10/05/18 Left Arm   10/05/18    1321    Arm   less than 1          Intake/Output Last 24 hours  Intake/Output Summary (Last 24 hours) at 10/05/2018 1617 Last data filed at 10/05/2018 1525 Gross per 24 hour  Intake 2200 ml  Output -  Net 2200 ml    Labs/Imaging Results for orders placed or performed during the hospital encounter of 10/05/18 (from the past 48 hour(s))  Lactic acid, plasma     Status: None   Collection Time: 10/05/18  1:00 PM  Result Value Ref Range   Lactic Acid, Venous 1.9 0.5 - 1.9 mmol/L    Comment: Performed at [facility], [address], [place], [place] [address]  Comprehensive metabolic panel  Status: Abnormal   Collection Time: 10/05/18  1:00 PM  Result Value Ref Range   Sodium 138 135 - 145 mmol/L   Potassium 5.3 (H) 3.5 - 5.1 mmol/L   Chloride 106 98 - 111 mmol/L   CO2 23 22 - 32 mmol/L   Glucose, Bld 173 (H) 70 - 99 mg/dL   BUN 141 (H) 8 - 23 mg/dL    Comment: RESULT CONFIRMED BY MANUAL DILUTION/HKP   Creatinine, Ser 2.23 (H) 0.61 - 1.24 mg/dL   Calcium 9.0 8.9 - 10.3 mg/dL   Total Protein 7.2 6.5 - 8.1 g/dL   Albumin 2.8 (L) 3.5 - 5.0 g/dL   AST 50 (H) 15 - 41 U/L   ALT 56 (H) 0 - 44 U/L   Alkaline Phosphatase 88 38 - 126 U/L   Total Bilirubin 0.9 0.3 - 1.2 mg/dL   GFR calc non Af Amer 25 (L) >60 mL/min   GFR calc Af Amer 29 (L) >60 mL/min   Anion gap 9 5 -  15    Comment: Performed at [facility], [address] [place], [place] [address]  CBC WITH DIFFERENTIAL     Status: Abnormal   Collection Time: 10/05/18  1:00 PM  Result Value Ref Range   WBC 10.7 (H) 4.0 - 10.5 K/uL   RBC 3.12 (L) 4.22 - 5.81 MIL/uL   Hemoglobin 8.6 (L) 13.0 - 17.0 g/dL   HCT 27.8 (L) 39.0 - 52.0 %   MCV 89.1 80.0 - 100.0 fL   MCH 27.6 26.0 - 34.0 pg   MCHC 30.9 30.0 - 36.0 g/dL   RDW 21.2 (H) 11.5 - 15.5 %   Platelets 214 150 - 400 K/uL   nRBC 0.0 0.0 - 0.2 %   Neutrophils Relative % 80 %   Neutro Abs 8.6 (H) 1.7 - 7.7 K/uL   Lymphocytes Relative 9 %   Lymphs Abs 1.0 0.7 - 4.0 K/uL   Monocytes Relative 10 %   Monocytes Absolute 1.1 (H) 0.1 - 1.0 K/uL   Eosinophils Relative 0 %   Eosinophils Absolute 0.0 0.0 - 0.5 K/uL   Basophils Relative 0 %   Basophils Absolute 0.0 0.0 - 0.1 K/uL   WBC Morphology MORPHOLOGY UNREMARKABLE    Smear Review Normal platelet morphology    Immature Granulocytes 1 %   Abs Immature Granulocytes 0.08 (H) 0.00 - 0.07 K/uL   Polychromasia PRESENT     Comment: Performed at [facility], [address] [place], [place] [address]  APTT     Status: Abnormal   Collection Time: 10/05/18  1:00 PM  Result Value Ref Range   aPTT 40 (H) 24 - 36 seconds    Comment:        IF BASELINE aPTT IS ELEVATED, SUGGEST PATIENT RISK ASSESSMENT BE USED TO DETERMINE APPROPRIATE ANTICOAGULANT THERAPY. Performed at [facility], [address] [place], [place] [address]   Protime-INR     Status: Abnormal   Collection Time: 10/05/18  1:00 PM  Result Value Ref Range   Prothrombin Time 17.3 (H) 11.4 - 15.2 seconds   INR 1.4 (H) 0.8 - 1.2    Comment: (NOTE) INR goal varies based on device and disease states. Performed at [facility], [address] [place], [place] [address]   SARS Coronavirus 2 [facility] order, Performed in [facility] hospital lab) Nasopharyngeal Nasopharyngeal Swab     Status: None    Collection Time: 10/05/18  1:48 PM  Specimen: Nasopharyngeal Swab  Result Value Ref Range   SARS Coronavirus 2 NEGATIVE NEGATIVE    Comment: (NOTE) If result is NEGATIVE SARS-CoV-2 target nucleic acids are NOT DETECTED. The SARS-CoV-2 RNA is generally detectable in upper and lower  respiratory specimens during the acute phase of infection. The lowest  concentration of SARS-CoV-2 viral copies this assay can detect is 250  copies / mL. A negative result does not preclude SARS-CoV-2 infection  and should not be used as the sole basis for treatment or other  patient management decisions.  A negative result may occur with  improper specimen collection / handling, submission of specimen other  than nasopharyngeal swab, presence of viral mutation(s) within the  areas targeted by this assay, and inadequate number of viral copies  (<250 copies / mL). A negative result must be combined with clinical  observations, patient history, and epidemiological information. If result is POSITIVE SARS-CoV-2 target nucleic acids are DETECTED. The SARS-CoV-2 RNA is generally detectable in upper and lower  respiratory specimens dur ing the acute phase of infection.  Positive  results are indicative of active infection with SARS-CoV-2.  Clinical  correlation with patient history and other diagnostic information is  necessary to determine patient infection status.  Positive results do  not rule out bacterial infection or co-infection with other viruses. If result is PRESUMPTIVE POSTIVE SARS-CoV-2 nucleic acids MAY BE PRESENT.   A presumptive positive result was obtained on the submitted specimen  and confirmed on repeat testing.  While 2019 novel coronavirus  (SARS-CoV-2) nucleic acids may be present in the submitted sample  additional confirmatory testing may be necessary for epidemiological  and / or clinical management purposes  to differentiate between  SARS-CoV-2 and other Sarbecovirus currently known  to infect humans.  If clinically indicated additional testing with an alternate test  methodology [phone]) is advised. The SARS-CoV-2 RNA is generally  detectable in upper and lower respiratory sp ecimens during the acute  phase of infection. The expected result is Negative. Fact Sheet for Patients:  StrictlyIdeas.no Fact Sheet for Healthcare Providers: BankingDealers.co.za This test is not yet approved or cleared by the [place] FDA and has been authorized for detection and/or diagnosis of SARS-CoV-2 by FDA under an Emergency Use Authorization (EUA).  This EUA will remain in effect (meaning this test can be used) for the duration of the COVID-19 declaration under Section 564(b)(1) of the Act, 21 U.S.C. section 360bbb-3(b)(1), unless the authorization is terminated or revoked sooner. Performed at [facility], [address] [place], [place] [address]   Urinalysis, Routine w reflex microscopic     Status: Abnormal   Collection Time: 10/05/18  2:25 PM  Result Value Ref Range   Color, Urine YELLOW (A) YELLOW   APPearance HAZY (A) CLEAR   Specific Gravity, Urine 1.016 1.005 - 1.030   pH 5.0 5.0 - 8.0   Glucose, UA NEGATIVE NEGATIVE mg/dL   Hgb urine dipstick NEGATIVE NEGATIVE   Bilirubin Urine NEGATIVE NEGATIVE   Ketones, ur NEGATIVE NEGATIVE mg/dL   Protein, ur 30 (A) NEGATIVE mg/dL   Nitrite NEGATIVE NEGATIVE   Leukocytes,Ua NEGATIVE NEGATIVE   RBC / HPF 0-5 0 - 5 RBC/hpf   WBC, UA 0-5 0 - 5 WBC/hpf   Bacteria, UA MANY (A) NONE SEEN   Squamous Epithelial / LPF NONE SEEN 0 - 5   Mucus PRESENT    Hyaline Casts, UA PRESENT     Comment: Performed at [facility], [address] [place], [place] [address]  CBC     Status: Abnormal   Collection Time: 10/05/18  2:25 PM  Result Value Ref Range   WBC 11.1 (H) 4.0 - 10.5 K/uL   RBC 3.06 (L) 4.22 - 5.81 MIL/uL   Hemoglobin 8.5 (L) 13.0 - 17.0 g/dL   HCT 27.9  (L) 39.0 - 52.0 %   MCV 91.2 80.0 - 100.0 fL   MCH 27.8 26.0 - 34.0 pg   MCHC 30.5 30.0 - 36.0 g/dL   RDW 21.2 (H) 11.5 - 15.5 %   Platelets 205 150 - 400 K/uL   nRBC 0.0 0.0 - 0.2 %    Comment: Performed at [facility], [address] [place], [place] [address]  Lactic acid, plasma     Status: None   Collection Time: 10/05/18  3:25 PM  Result Value Ref Range   Lactic Acid, Venous 1.6 0.5 - 1.9 mmol/L    Comment: Performed at [facility], [address] [place], [place] [address]   Ct Abdomen Pelvis Wo Contrast  Result Date: 10/05/2018 CLINICAL DATA:  Pt ems from [facility] for AMS x 1 week, decreased oral intake x 3-4 day, and fever (100.5 ax here) of unknown length. Pt poor historian. HX prostate cancer. EXAM: CT CHEST, ABDOMEN AND PELVIS WITHOUT CONTRAST TECHNIQUE: Multidetector CT imaging of the chest, abdomen and pelvis was performed following the standard protocol without IV contrast. COMPARISON:  None. FINDINGS: CT CHEST FINDINGS Cardiovascular: Heart mildly enlarged. No pericardial effusion. Dense three-vessel coronary artery calcifications. Changes from prior CABG surgery. Great vessels normal in caliber. Aortic atherosclerotic calcifications extending to the arch branch vessels. Mediastinum/Nodes: No neck base, axillary, mediastinal or hilar masses or enlarged lymph nodes. Trachea and esophagus are unremarkable. Lungs/Pleura: Mild interstitial thickening most evident in the lung bases. Bronchial wall thickening, also in the lower lungs most evident in the lower lobes. Minimal pleural effusions. No lung consolidation. No pneumothorax. Musculoskeletal: No acute fracture. No osteoblastic or osteolytic lesions. CT ABDOMEN PELVIS FINDINGS Hepatobiliary: 7 mm low-density lesion, segment 7. Liver normal in size and attenuation. No other masses or lesions. Gallbladder is unremarkable. No bile duct dilation. Pancreas: Unremarkable. No pancreatic ductal  dilatation or surrounding inflammatory changes. Spleen: Spleen normal in size. Peripheral calcifications. No masses. Adrenals/Urinary Tract: No adrenal masses. Marked left renal atrophy. Right kidney normal in size. No renal masses. Nonobstructing left intrarenal stones. No right intrarenal stones. No hydronephrosis. Ureters normal in course and in caliber. Bladder is unremarkable. Stomach/Bowel: Stomach is unremarkable. Small bowel and colon are normal in caliber. Small bowel loops anterior a right inguinal hernia without obstruction or signs of incarceration or strangulation. No wall thickening or inflammation. Moderate increased stool burden in the colon. There are numerous sigmoid diverticula without diverticulitis. Normal appendix visualized. Vascular/Lymphatic: There is mixed attenuation material extending beyond the intimal calcifications of the aorta at its hiatus, enlarging the transverse diameter of the vessel 2 4.8 x 2.9 cm. This extends for a short distance from superior to inferior, approximately 3.3 cm. This is consistent with a focal aneurysm or, possibly, focal contain rupture or penetrating ulcer. The chronicity of this finding is unclear. Below this the aorta is normal in caliber with dense atherosclerotic calcifications, which extended into branch vessels. No enlarged lymph nodes. Reproductive: Radiation therapy seeds lie within the prostate and prostate bed. Other: No other abdominal wall hernia. Trace amount of ascites lies adjacent to the liver. Musculoskeletal: Chronic fracture of T12. There has  been a long fusion with bilateral pedicle screws interconnecting rods extending from T12 through L5. L5 is a transitional lumbosacral vertebra. 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 No acute fracture.  No osteoblastic or osteolytic lesions. IMPRESSION: 1. Mild interstitial thickening in the lower lungs associated with mild cardiomegaly and minimal  effusions. Suspect mild congestive heart failure. There is also bronchial wall thickening which may be due to edema or reflect bronchial inflammation. 2. Focal enlargement of the aorta at at its hiatus. This may reflect a focal aneurysm or a focal contained rupture/pseudoaneurysm. Penetrating ulcer is possible. Further assessment with CT angiography, if this patient can tolerate contrast, is recommended. 3. Trace amount of ascites, nonspecific. 4. Marked left renal atrophy. 5. Extensive aortic atherosclerotic disease. 6. Right inguinal hernia containing small bowel, without obstruction, incarceration or strangulation. Electronically Signed   By: Lajean Manes M.D.   On: 10/05/2018 15:12   Ct Head Wo Contrast  Result Date: 10/05/2018 CLINICAL DATA:  Pt ems from [facility] for AMS x 1 week, decreased oral intake x 3-4 day, and fever (100.5 ax here) of unknown length. Pt poor historian. HX prostate cancer. EXAM: CT HEAD WITHOUT CONTRAST TECHNIQUE: Contiguous axial images were obtained from the base of the skull through the vertex without intravenous contrast. COMPARISON:  None. FINDINGS: Brain: No acute infarct or intracranial hemorrhage. The ventricles are normal in configuration. There is age appropriate mild ventricular and sulcal enlargement. No parenchymal masses. Mild periventricular white matter hypoattenuation noted consistent with chronic microvascular ischemic change. CSF density extra-axial mass indents the lateral right frontal lobe, measuring 4.3 x 2.4 x 5 cm, consistent with an arachnoid cyst. No other extra-axial abnormalities. No intracranial hemorrhage. Vascular: No hyperdense vessel or unexpected calcification. Skull: Normal. Negative for fracture or focal lesion. Sinuses/Orbits: Visualized globes and orbits are unremarkable. The visualized sinuses and mastoid air cells are clear. Other: None. IMPRESSION: 1. No acute intracranial abnormalities. 2. Age-appropriate volume loss.  Mild chronic microvascular ischemic change. Chronic right arachnoid cyst. Electronically Signed   By: Lajean Manes M.D.   On: 10/05/2018 14:56   Ct Chest Wo Contrast  Result Date: 10/05/2018 CLINICAL DATA:  [facility] ems from [facility] for AMS x 1 week, decreased oral intake x 3-4 day, and fever (100.5 ax here) of unknown length. Pt poor historian. HX prostate cancer. EXAM: CT CHEST, ABDOMEN AND PELVIS WITHOUT CONTRAST TECHNIQUE: Multidetector CT imaging of the chest, abdomen and pelvis was performed following the standard protocol without IV contrast. COMPARISON:  None. FINDINGS: CT CHEST FINDINGS Cardiovascular: Heart mildly enlarged. No pericardial effusion. Dense three-vessel coronary artery calcifications. Changes from prior CABG surgery. Great vessels normal in caliber. Aortic atherosclerotic calcifications extending to the arch branch vessels. Mediastinum/Nodes: No neck base, axillary, mediastinal or hilar masses or enlarged lymph nodes. Trachea and esophagus are unremarkable. Lungs/Pleura: Mild interstitial thickening most evident in the lung bases. Bronchial wall thickening, also in the lower lungs most evident in the lower lobes. Minimal pleural effusions. No lung consolidation. No pneumothorax. Musculoskeletal: No acute fracture. No osteoblastic or osteolytic lesions. CT ABDOMEN PELVIS FINDINGS Hepatobiliary: 7 mm low-density lesion, segment 7. Liver normal in size and attenuation. No other masses or lesions. Gallbladder is unremarkable. No bile duct dilation. Pancreas: Unremarkable. No pancreatic ductal dilatation or surrounding inflammatory changes. Spleen: Spleen normal in size. Peripheral calcifications. No masses. Adrenals/Urinary Tract: No adrenal masses. Marked left renal atrophy. Right kidney normal in size. No renal masses. Nonobstructing left  intrarenal stones. No right intrarenal stones. No hydronephrosis. Ureters normal in course and in caliber. Bladder is unremarkable.  Stomach/Bowel: Stomach is unremarkable. Small bowel and colon are normal in caliber. Small bowel loops anterior a right inguinal hernia without obstruction or signs of incarceration or strangulation. No wall thickening or inflammation. Moderate increased stool burden in the colon. There are numerous sigmoid diverticula without diverticulitis. Normal appendix visualized. Vascular/Lymphatic: There is mixed attenuation material extending beyond the intimal calcifications of the aorta at its hiatus, enlarging the transverse diameter of the vessel 2 4.8 x 2.9 cm. This extends for a short distance from superior to inferior, approximately 3.3 cm. This is consistent with a focal aneurysm or, possibly, focal contain rupture or penetrating ulcer. The chronicity of this finding is unclear. Below this the aorta is normal in caliber with dense atherosclerotic calcifications, which extended into branch vessels. No enlarged lymph nodes. Reproductive: Radiation therapy seeds lie within the prostate and prostate bed. Other: No other abdominal wall hernia. Trace amount of ascites lies adjacent to the liver. Musculoskeletal: Chronic fracture of T12. There has been a long fusion with bilateral pedicle screws interconnecting rods extending from T12 through L5. L5 is a transitional lumbosacral vertebra. 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 No acute fracture.  No osteoblastic or osteolytic lesions. IMPRESSION: 1. Mild interstitial thickening in the lower lungs associated with mild cardiomegaly and minimal effusions. Suspect mild congestive heart failure. There is also bronchial wall thickening which may be due to edema or reflect bronchial inflammation. 2. Focal enlargement of the aorta at at its hiatus. This may reflect a focal aneurysm or a focal contained rupture/pseudoaneurysm. Penetrating ulcer is possible. Further assessment with CT angiography, if this patient can tolerate  contrast, is recommended. 3. Trace amount of ascites, nonspecific. 4. Marked left renal atrophy. 5. Extensive aortic atherosclerotic disease. 6. Right inguinal hernia containing small bowel, without obstruction, incarceration or strangulation. Electronically Signed   By: Lajean Manes M.D.   On: 10/05/2018 15:12   Dg Chest Port 1 View  Result Date: 10/05/2018 CLINICAL DATA:  Fever and weakness EXAM: PORTABLE CHEST 1 VIEW COMPARISON:  May 06, 2017 FINDINGS: There is interstitial edema with a slight degree of interstitial edema. No consolidation. There is cardiomegaly with mild pulmonary venous hypertension. No adenopathy. Patient is status post median sternotomy. There is aortic atherosclerosis. There is also calcification in the carotid arteries bilaterally. There is postoperative change in the right neck region. There is postoperative change in the upper lumbar region. IMPRESSION: There is a degree of pulmonary vascular congestion with slight interstitial edema. No frank consolidation. There is postoperative change in the right neck region. Aortic Atherosclerosis (ICD10-I70.0). There is carotid artery calcification bilaterally. Electronically Signed   By: Lowella Grip III M.D.   On: 10/05/2018 13:43    Pending Labs Unresulted Labs (From admission, onward)    Start     Ordered   10/05/18 1302  Blood Culture (routine x 2)  BLOOD CULTURE X 2,   STAT     10/05/18 1302   10/05/18 1302  Urine culture  ONCE - STAT,   STAT     10/05/18 1302   Signed and Held  Basic metabolic panel  Tomorrow morning,   R     Signed and Held          Vitals/Pain Today's Vitals   10/05/18 1500 10/05/18 1530 10/05/18 1545 10/05/18 1600  BP: 112/60 102/63  (!) 94/44  Pulse: Marland Kitchen)  103 86 (!) 106 (!) 104  Resp: 14 14 (!) 21 20  Temp:      TempSrc:      SpO2: 100% 98% 97% 97%  Weight:      Height:        Isolation Precautions Airborne and Contact precautions  Medications Medications  ceFEPIme (MAXIPIME) 2  g in sodium chloride 0.9 % 100 mL IVPB (0 g Intravenous Stopped 10/05/18 1411)  metroNIDAZOLE (FLAGYL) IVPB 500 mg (0 mg Intravenous Stopped 10/05/18 1500)  sodium chloride 0.9 % bolus 1,000 mL (0 mLs Intravenous Stopped 10/05/18 1525)    And  sodium chloride 0.9 % bolus 1,000 mL (0 mLs Intravenous Stopped 10/05/18 1424)    Mobility walks with device High fall risk   Focused Assessments septic handoff   R Recommendations: See Admitting Provider Note  Report given to:   Additional Notes:

## 2018-10-05 NOTE — ED Triage Notes (Signed)
Pt ems from [facility] for AMS x 1 week, decreased oral intake x 3-4 day, and fever (100.5 ax here) of unknown length. Pt poor historian.

## 2018-10-05 NOTE — Progress Notes (Signed)
Patient ID: Dennis Burns, male   DOB: May 24, 1928, 85 y.o.   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 I spoke with patient's wife about it--- she was not too happy to hear the news I have called and spoken with [facility] transfer center waiting for vascular surgery to call me back.

## 2018-10-05 NOTE — Progress Notes (Signed)
Family Meeting Note  Advance Directive yes Today a meeting took place with the wife in ER patient came in with generalized weakness and some confusion from baseline. He has history of atherosclerotic vascular disease with coronary artery disease and peripheral arterial disease with hypertension comes in with acute on chronic renal failure. He also had some low-grade fever. So far no source of infection identified. Code status address with patient's wife who is the healthcare power of attorney. Patient is a full code. Time spent 16 minutes Fritzi Mandes, MD

## 2018-10-05 NOTE — ED Provider Notes (Signed)
[facility] Emergency Department Provider Note  ____________________________________________  Time seen: Approximately 1:14 PM  I have reviewed the triage vital signs and the nursing notes.   HISTORY  Chief Complaint Altered Mental Status    Level 5 Caveat: Portions of the History and Physical including HPI and review of systems are unable to be completely obtained due to patient being a poor historian   HPI Verland Sprinkle is a 85 y.o. male with a history of hypertension, CKD, CAD, living with his spouse at [facility] independent living  who is sent to the ED by EMS today due to worsening confusion over the past week associated with generalized weakness and decreased oral intake over the last 4 days.  EMS report a fever of 101 axillary.  Patient also reported pain but is unable to elaborate on the location of the pain.  Seems to be indicating the upper abdomen.     Past Medical History:  Diagnosis Date  . Allergic rhinitis due to pollen    as child--better now  . Chronic kidney disease, stage III (moderate) (HCC)   . Coronary artery disease   . DVT, lower extremity, recurrent ([place])   . Glaucoma    [facility]   . History of prostate cancer 2004  . Hyperlipidemia   . Hypertension   . Hypothyroidism   . Impaired fasting glucose   . Prostate cancer ([place]) 2004   Rad seed implants  . Spinal stenosis of lumbar region with radiculopathy      Patient Active Problem List   Diagnosis Date Noted  . Acute on chronic renal failure ([place]) 10/05/2018  . Advance directive discussed with patient 11/12/2017  . Right inguinal hernia 10/18/2017  . Hoarseness 07/25/2017  . Mood disorder ([place]) 07/25/2017  . Chronic combined systolic and diastolic heart failure ([place])   . Effort angina ([place]) 02/14/2017  . Positive cardiac stress test 02/04/2017  . Paronychia of finger 06/13/2016  . Preventative health care 05/03/2016  . Pulmonary hypertension ([place]) 05/03/2016  .  PAD (peripheral artery disease) ([place]) 04/18/2016  . Trigger finger 03/08/2016  . DOE (dyspnea on exertion) 11/02/2015  . Spinal stenosis of lumbar region with radiculopathy   . Coronary artery disease   . History of prostate cancer   . Hyperlipidemia   . Hypertension   . Chronic kidney disease, stage III (moderate) (HCC)   . DVT, lower extremity, recurrent ([place])   . Glaucoma   . Hypothyroidism   . Prediabetes      Past Surgical History:  Procedure Laterality Date  . CAROTID ENDARTERECTOMY Right 09/2004   [place] GRAFT  2003   [place]   after fall  . INSERTION PROSTATE RADIATION SEED  2004   and RT  . POSTERIOR LUMBAR FUSION  2012, 2014  . RIGHT/LEFT HEART CATH AND CORONARY ANGIOGRAPHY N/A 02/14/2017   Procedure: RIGHT/LEFT HEART CATH AND CORONARY ANGIOGRAPHY;  Surgeon: Wellington Hampshire, MD;  Location: [place] [facility];  Service: Cardiovascular;  Laterality: N/A;     Prior to Admission medications   Medication Sig Start Date End Date Taking? Authorizing Provider  Alpha-Lipoic Acid 200 MG CAPS Take 1 capsule by mouth daily.   Yes [provider]  apixaban (ELIQUIS) 2.5 MG TABS tablet Take 1 tablet (2.5 mg total) by mouth 2 (two) times daily. 05/07/18  Yes Minna Merritts, MD  atorvastatin (LIPITOR) 40 MG tablet  Take 1 tablet (40 mg total) by mouth daily. 03/11/18  Yes Dunn, Areta Haber, PA-C  CALCIUM CITRATE PO Take 1 tablet by mouth daily. Takes 300mg  daily.   Yes [provider]  carvedilol (COREG) 12.5 MG tablet Take 0.5 tablets (6.25 mg total) by mouth 2 (two) times daily with a meal. 02/21/18  Yes Dunn, Areta Haber, PA-C  Cholecalciferol (VITAMIN D) 2000 units CAPS Take 4,000 Units by mouth daily.   Yes [provider]  Coenzyme Q10 (CO Q10) 200 MG CAPS Take 1 capsule by mouth daily.   Yes [provider]  CRANBERRY EXTRACT PO Take 1 tablet by mouth daily. Takes 1500mg  daily   Yes  [provider]  ezetimibe (ZETIA) 10 MG tablet TAKE 1 TABLET EVERY DAY 02/18/18  Yes Gollan, Kathlene November, MD  Ferrous Gluconate (IRON 27 PO) Take by mouth daily.   Yes [provider]  hydrALAZINE (APRESOLINE) 10 MG tablet TAKE 1 TABLET THREE TIMES DAILY 03/11/18  Yes [name], MD  isosorbide mononitrate (IMDUR) 60 MG 24 hr tablet Take 1 tablet (60 mg total) by mouth daily. 06/24/18 12/21/18 Yes Gollan, Kathlene November, MD  latanoprost (XALATAN) 0.005 % ophthalmic solution Place 1 drop into both eyes at bedtime. In morning 10/27/15  Yes [provider]  levothyroxine (SYNTHROID) 75 MCG tablet Take 1 tablet (75 mcg total) by mouth daily. 09/04/18  Yes [name], MD  Multiple Vitamin (MULTIVITAMIN) tablet Take 1 tablet by mouth daily.   Yes [provider]  Omega 3-6-9 Fatty Acids (OMEGA-3-6-9 PO) Take 1 capsule by mouth 2 (two) times daily.   Yes [provider]  oxyCODONE-acetaminophen (PERCOCET) 7.5-325 MG tablet Take 1 tablet by mouth every 6 (six) hours as needed for pain.   Yes [provider]  potassium chloride (K-DUR) 10 MEQ tablet Take 1 tablet (10 mEq total) by mouth as directed. Take 3 times daily and 4 pills when taking metolazone Patient taking differently: Take 10 mEq by mouth 2 (two) times daily. Take 3 times daily and 4 pills when taking metolazone 07/10/17  Yes Gollan, Kathlene November, MD  sacubitril-valsartan (ENTRESTO) 49-51 MG Take 1 tablet by mouth 2 (two) times daily. 07/22/18  Yes Gollan, Kathlene November, MD  timolol (BETIMOL) 0.5 % ophthalmic solution Place 1 drop into both eyes 2 (two) times daily.   Yes [provider]  torsemide (DEMADEX) 20 MG tablet Take 2 tablets (40 mg total) by mouth 2 (two) times daily. 09/02/18 12/01/18 Yes Gollan, Kathlene November, MD  diclofenac sodium (VOLTAREN) 1 % GEL Apply 2 g topically 4 (four) times daily.    [provider]  feeding supplement, ENSURE ENLIVE, (ENSURE ENLIVE) LIQD Take 237  mLs by mouth 2 (two) times daily between meals. 05/10/17   Bettey Costa, MD  metolazone (ZAROXOLYN) 2.5 MG tablet Take 1 tablet (2.5 mg total) by mouth daily as needed. 08/06/18 11/04/18  Minna Merritts, MD  nitroGLYCERIN (NITROSTAT) 0.4 MG SL tablet Place 1 tablet (0.4 mg total) under the tongue every 5 (five) minutes as needed for chest pain. 05/10/17   Bettey Costa, MD  traMADol (ULTRAM) 50 MG tablet Take 2 tablets (100 mg total) by mouth 3 (three) times daily as needed. 05/13/18   [name], MD     Allergies Patient has no known allergies.   Family History  Problem Relation Age of Onset  . Cancer Mother        colon (cause of death) and breast  .  Glaucoma Mother   . Cancer Father        colon  . Stroke Maternal Grandfather   . Diabetes Neg Hx     Social History Social History   Tobacco Use  . Smoking status: Former Smoker    Packs/day: 0.25    Years: 18.00    Pack years: 4.50    Quit date: 1978    Years since quitting: 42.6  . Smokeless tobacco: Never Used  . Tobacco comment: Quit in 1978  Substance Use Topics  . Alcohol use: Yes  . Drug use: No    Review of Systems Level 5 Caveat: Portions of the History and Physical including HPI and review of systems are unable to be completely obtained due to patient being a poor historian   Constitutional: Positive fever.  ENT:   No rhinorrhea. Cardiovascular:   No chest pain or syncope. Respiratory:   No dyspnea or cough. Gastrointestinal:   Positive abdominal pain without known vomiting and diarrhea.  Musculoskeletal:   Negative for focal pain or swelling ____________________________________________   PHYSICAL EXAM:  VITAL SIGNS: ED Triage Vitals  Enc Vitals Group     BP 10/05/18 1309 99/61     Pulse Rate 10/05/18 1309 93     Resp --      Temp 10/05/18 1309 (!) 100.8 F (38.2 C)     Temp Source 10/05/18 1309 Oral     SpO2 10/05/18 1309 100 %     Weight 10/05/18 1310 140 lb (63.5 kg)     Height 10/05/18 1310  5\' 3"  (1.6 m)     Head Circumference --      Peak Flow --      Pain Score --      Pain Loc --      Pain Edu? --      Excl. in [place]? --     Vital signs reviewed, nursing assessments reviewed.   Constitutional:   Alert and oriented to person and place. Non-toxic appearance. Eyes:   Conjunctivae are normal. EOMI. PERRL. ENT      Head:   Normocephalic and atraumatic.      Nose:   No congestion/rhinnorhea.       Mouth/Throat:   Dry mucous membranes, no pharyngeal erythema. No peritonsillar mass.       Neck:   No meningismus. Full ROM. Hematological/Lymphatic/Immunilogical:   No cervical lymphadenopathy. Cardiovascular:   RRR. Symmetric bilateral radial and DP pulses.  No murmurs. Cap refill less than 2 seconds. Respiratory:   Normal respiratory effort without tachypnea/retractions. Breath sounds are clear and equal bilaterally. No wheezes/rales/rhonchi. Gastrointestinal:   Soft with generalized tenderness worse in the suprapubic area. Non distended. There is no CVA tenderness.  No rebound, rigidity, or guarding. Musculoskeletal:   Normal range of motion in all extremities. No joint effusions.  No lower extremity tenderness.  No edema. Neurologic:   Normal speech, very limited language expression  Motor grossly intact. No acute focal neurologic deficits are appreciated.  Skin:    Skin is warm, dry and intact. No rash noted.  No petechiae, purpura, or bullae.  ____________________________________________    LABS (pertinent positives/negatives) (all labs ordered are listed, but only abnormal results are displayed) Labs Reviewed  COMPREHENSIVE METABOLIC PANEL - Abnormal; Notable for the following components:      Result Value   Potassium 5.3 (*)    Glucose, Bld 173 (*)    BUN 141 (*)    Creatinine, Ser 2.23 (*)  Albumin 2.8 (*)    AST 50 (*)    ALT 56 (*)    GFR calc non Af Amer 25 (*)    GFR calc Af Amer 29 (*)    All other components within normal limits  CBC WITH  DIFFERENTIAL/PLATELET - Abnormal; Notable for the following components:   WBC 10.7 (*)    RBC 3.12 (*)    Hemoglobin 8.6 (*)    HCT 27.8 (*)    RDW 21.2 (*)    Neutro Abs 8.6 (*)    Monocytes Absolute 1.1 (*)    Abs Immature Granulocytes 0.08 (*)    All other components within normal limits  APTT - Abnormal; Notable for the following components:   aPTT 40 (*)    All other components within normal limits  PROTIME-INR - Abnormal; Notable for the following components:   Prothrombin Time 17.3 (*)    INR 1.4 (*)    All other components within normal limits  URINALYSIS, ROUTINE W REFLEX MICROSCOPIC - Abnormal; Notable for the following components:   Color, Urine YELLOW (*)    APPearance HAZY (*)    Protein, ur 30 (*)    Bacteria, UA MANY (*)    All other components within normal limits  CULTURE, BLOOD (ROUTINE X 2)  CULTURE, BLOOD (ROUTINE X 2)  URINE CULTURE  SARS CORONAVIRUS 2 (HOSPITAL ORDER, [place] [facility])  LACTIC ACID, PLASMA  LACTIC ACID, PLASMA  CBC   ____________________________________________   EKG  Interpreted by me Sinus tachycardia rate of 100, normal axis and intervals.  Normal QRS.  Slight ST depression in lateral leads associated with LVH with repolarization abnormality.  No acute ischemic changes.  ____________________________________________    RADIOLOGY  Ct Head Wo Contrast  Result Date: 10/05/2018 CLINICAL DATA:  Pt ems from [facility] for AMS x 1 week, decreased oral intake x 3-4 day, and fever (100.5 ax here) of unknown length. Pt poor historian. HX prostate cancer. EXAM: CT HEAD WITHOUT CONTRAST TECHNIQUE: Contiguous axial images were obtained from the base of the skull through the vertex without intravenous contrast. COMPARISON:  None. FINDINGS: Brain: No acute infarct or intracranial hemorrhage. The ventricles are normal in configuration. There is age appropriate mild ventricular and sulcal enlargement. No  parenchymal masses. Mild periventricular white matter hypoattenuation noted consistent with chronic microvascular ischemic change. CSF density extra-axial mass indents the lateral right frontal lobe, measuring 4.3 x 2.4 x 5 cm, consistent with an arachnoid cyst. No other extra-axial abnormalities. No intracranial hemorrhage. Vascular: No hyperdense vessel or unexpected calcification. Skull: Normal. Negative for fracture or focal lesion. Sinuses/Orbits: Visualized globes and orbits are unremarkable. The visualized sinuses and mastoid air cells are clear. Other: None. IMPRESSION: 1. No acute intracranial abnormalities. 2. Age-appropriate volume loss. Mild chronic microvascular ischemic change. Chronic right arachnoid cyst. Electronically Signed   By: Lajean Manes M.D.   On: 10/05/2018 14:56   Dg Chest Port 1 View  Result Date: 10/05/2018 CLINICAL DATA:  Fever and weakness EXAM: PORTABLE CHEST 1 VIEW COMPARISON:  May 06, 2017 FINDINGS: There is interstitial edema with a slight degree of interstitial edema. No consolidation. There is cardiomegaly with mild pulmonary venous hypertension. No adenopathy. Patient is status post median sternotomy. There is aortic atherosclerosis. There is also calcification in the carotid arteries bilaterally. There is postoperative change in the right neck region. There is postoperative change in the upper lumbar region. IMPRESSION: There is a degree of pulmonary vascular congestion  with slight interstitial edema. No frank consolidation. There is postoperative change in the right neck region. Aortic Atherosclerosis (ICD10-I70.0). There is carotid artery calcification bilaterally. Electronically Signed   By: Lowella Grip III M.D.   On: 10/05/2018 13:43    ____________________________________________   PROCEDURES .Critical Care Performed by: Carrie Mew, MD Authorized by: Carrie Mew, MD   Critical care provider statement:    Critical care time (minutes):   35   Critical care time was exclusive of:  Separately billable procedures and treating other patients   Critical care was necessary to treat or prevent imminent or life-threatening deterioration of the following conditions:  CNS failure or compromise and sepsis   Critical care was time spent personally by me on the following activities:  Development of treatment plan with patient or surrogate, discussions with consultants, evaluation of patient's response to treatment, examination of patient, obtaining history from patient or surrogate, ordering and performing treatments and interventions, ordering and review of laboratory studies, ordering and review of radiographic studies, pulse oximetry, re-evaluation of patient's condition and review of old charts    ____________________________________________  DIFFERENTIAL DIAGNOSIS   Pneumonia, UTI, pyelonephritis, appendicitis, diverticulitis, pancreatitis, sepsis.  Doubt SSTI, meningitis encephalitis, Fournier's.  CLINICAL IMPRESSION / ASSESSMENT AND PLAN / ED COURSE  Pertinent labs & imaging results that were available during my care of the patient were reviewed by me and considered in my medical decision making (see chart for details).   Dennis Burns was evaluated in Emergency Department on 10/05/2018 for the symptoms described in the history of present illness. He was evaluated in the context of the global COVID-19 pandemic, which necessitated consideration that the patient might be at risk for infection with the SARS-CoV-2 virus that causes COVID-19. Institutional protocols and algorithms that pertain to the evaluation of patients at risk for COVID-19 are in a state of rapid change based on information released by regulatory bodies including the CDC and federal and state organizations. These policies and algorithms were followed during the patient's care in the ED.   Patient presents with confusion, fever.  Low-grade tachycardia which is likely also  being masked by his carvedilol use.  EMS reported an episode of hypotension which is not present on arrival to the ED but with his fever and suspected sepsis, sepsis protocol initiated, 2 L IV fluid bolus ordered, empiric antibiotics with cefepime and Flagyl for now, suspecting most likely intra-abdominal versus UTI as a source..  Clinical Course as of Oct 04 1504  Sat Oct 05, 2018  1408 Hemoglobin 8.6, down from a baseline of 15 5 months ago.  Rectal exam shows brown stool, Hemoccult negative.  We will repeat the CBC in case this was erroneous.  Chemistry panel shows acute on chronic renal insufficiency/AKI.  I will obtain noncontrast CT chest abdomen pelvis due to sofar diagnostic uncertainty.  No urine output yet.   [PS]    Clinical Course User Index [PS] Carrie Mew, MD     ----------------------------------------- 3:06 PM on 10/05/2018 -----------------------------------------  Work-up results discussed with patient and wife at bedside.  Discussed with hospitalist for further management.  CT head unremarkable.  Awaiting radiology report on CT chest abdomen pelvis  ____________________________________________   FINAL CLINICAL IMPRESSION(S) / ED DIAGNOSES    Final diagnoses:  AKI (acute kidney injury) ([place])  Dehydration  Sepsis, due to unspecified organism, unspecified whether acute organ dysfunction present [facility])     ED Discharge Orders    None      Portions of  this note were generated with dragon dictation software. Dictation errors may occur despite best attempts at proofreading.   Carrie Mew, MD 10/05/18 [phone]

## 2018-10-05 NOTE — Progress Notes (Signed)
Call received from [facility] bed placement informing of bed ready for patient. Call placed to on call provider [facility] Awaiting call back.

## 2018-10-05 NOTE — Progress Notes (Signed)
Patient ID: Dennis Burns, male   DOB: 09-Mar-1928, 85 y.o.   MRN: [id number] spoke with vascular surgery Dr. Lovenia Shuck at [facility]. He reviewed the CT imaging studies. He feels patient will benefit for evaluation however will need CT angiogram/or pet scan to define the aneurysm appropriately. Will continue IV hydration of fluids at present. Patient has been placed on the transfer list for vascular surgery Dr. Tobie Poet.  Dr Feliberto Gottron informed and will see the patient over the weekend till he is here.

## 2018-10-06 ENCOUNTER — Other Ambulatory Visit: Payer: Self-pay

## 2018-10-06 DIAGNOSIS — N280 Ischemia and infarction of kidney: Secondary | ICD-10-CM | POA: Diagnosis not present

## 2018-10-06 DIAGNOSIS — R509 Fever, unspecified: Secondary | ICD-10-CM | POA: Diagnosis not present

## 2018-10-06 DIAGNOSIS — I5023 Acute on chronic systolic (congestive) heart failure: Secondary | ICD-10-CM | POA: Diagnosis not present

## 2018-10-06 DIAGNOSIS — I716 Thoracoabdominal aortic aneurysm, without rupture: Secondary | ICD-10-CM | POA: Diagnosis not present

## 2018-10-06 DIAGNOSIS — I350 Nonrheumatic aortic (valve) stenosis: Secondary | ICD-10-CM | POA: Diagnosis not present

## 2018-10-06 DIAGNOSIS — I714 Abdominal aortic aneurysm, without rupture: Secondary | ICD-10-CM | POA: Diagnosis not present

## 2018-10-06 DIAGNOSIS — Z7901 Long term (current) use of anticoagulants: Secondary | ICD-10-CM | POA: Diagnosis not present

## 2018-10-06 DIAGNOSIS — N179 Acute kidney failure, unspecified: Secondary | ICD-10-CM | POA: Diagnosis not present

## 2018-10-06 DIAGNOSIS — I2721 Secondary pulmonary arterial hypertension: Secondary | ICD-10-CM | POA: Diagnosis not present

## 2018-10-06 DIAGNOSIS — Z7401 Bed confinement status: Secondary | ICD-10-CM | POA: Diagnosis not present

## 2018-10-06 DIAGNOSIS — E43 Unspecified severe protein-calorie malnutrition: Secondary | ICD-10-CM | POA: Diagnosis not present

## 2018-10-06 DIAGNOSIS — J9690 Respiratory failure, unspecified, unspecified whether with hypoxia or hypercapnia: Secondary | ICD-10-CM | POA: Diagnosis not present

## 2018-10-06 DIAGNOSIS — E872 Acidosis: Secondary | ICD-10-CM | POA: Diagnosis not present

## 2018-10-06 DIAGNOSIS — E875 Hyperkalemia: Secondary | ICD-10-CM | POA: Diagnosis not present

## 2018-10-06 DIAGNOSIS — I5089 Other heart failure: Secondary | ICD-10-CM | POA: Diagnosis not present

## 2018-10-06 DIAGNOSIS — I711 Thoracic aortic aneurysm, ruptured: Secondary | ICD-10-CM | POA: Diagnosis not present

## 2018-10-06 DIAGNOSIS — N183 Chronic kidney disease, stage 3 (moderate): Secondary | ICD-10-CM | POA: Diagnosis not present

## 2018-10-06 DIAGNOSIS — I130 Hypertensive heart and chronic kidney disease with heart failure and stage 1 through stage 4 chronic kidney disease, or unspecified chronic kidney disease: Secondary | ICD-10-CM | POA: Diagnosis not present

## 2018-10-06 DIAGNOSIS — R339 Retention of urine, unspecified: Secondary | ICD-10-CM | POA: Diagnosis not present

## 2018-10-06 DIAGNOSIS — J90 Pleural effusion, not elsewhere classified: Secondary | ICD-10-CM | POA: Diagnosis not present

## 2018-10-06 DIAGNOSIS — K4090 Unilateral inguinal hernia, without obstruction or gangrene, not specified as recurrent: Secondary | ICD-10-CM | POA: Diagnosis not present

## 2018-10-06 DIAGNOSIS — J9811 Atelectasis: Secondary | ICD-10-CM | POA: Diagnosis not present

## 2018-10-06 DIAGNOSIS — I712 Thoracic aortic aneurysm, without rupture: Secondary | ICD-10-CM | POA: Diagnosis not present

## 2018-10-06 DIAGNOSIS — I517 Cardiomegaly: Secondary | ICD-10-CM | POA: Diagnosis not present

## 2018-10-06 LAB — URINE CULTURE: Culture: NO GROWTH

## 2018-10-06 MED ORDER — POTASSIUM CHLORIDE CRYS ER 10 MEQ PO TBCR: 10.0000 meq | EXTENDED_RELEASE_TABLET | Freq: Two times a day (BID) | ORAL | Status: AC

## 2018-10-06 MED ORDER — ISOSORBIDE MONONITRATE ER 60 MG PO TB24: 60.0000 mg | ORAL_TABLET | Freq: Every day | ORAL | Status: AC

## 2018-10-06 MED ORDER — TIMOLOL MALEATE 0.5 % OP SOLN: 1.0000 [drp] | Freq: Two times a day (BID) | OPHTHALMIC | 12 refills | Status: AC

## 2018-10-06 MED ORDER — VITAMIN D3 25 MCG PO TABS: 4000.0000 [IU] | ORAL_TABLET | Freq: Every day | ORAL | Status: AC

## 2018-10-06 MED ORDER — POLYETHYLENE GLYCOL 3350 17 G PO PACK: 17.0000 g | PACK | Freq: Every day | ORAL | 0 refills | Status: AC | PRN

## 2018-10-06 MED ORDER — ACETAMINOPHEN 325 MG PO TABS: 650.0000 mg | ORAL_TABLET | Freq: Four times a day (QID) | ORAL | Status: AC | PRN

## 2018-10-06 MED ORDER — TRAMADOL HCL 50 MG PO TABS: 100.0000 mg | ORAL_TABLET | Freq: Three times a day (TID) | ORAL | Status: AC | PRN

## 2018-10-06 MED ORDER — SODIUM CHLORIDE 0.9 % IV SOLN: 75.0000 mL | INTRAVENOUS | 0 refills | Status: AC

## 2018-10-06 MED ORDER — LATANOPROST 0.005 % OP SOLN: 1.0000 [drp] | Freq: Every day | OPHTHALMIC | 12 refills | Status: AC

## 2018-10-06 MED ORDER — ONDANSETRON HCL 4 MG PO TABS: 4.0000 mg | ORAL_TABLET | Freq: Four times a day (QID) | ORAL | 0 refills | Status: AC | PRN

## 2018-10-06 MED ORDER — BISACODYL 10 MG RE SUPP: 10.0000 mg | Freq: Every day | RECTAL | 0 refills | Status: AC | PRN

## 2018-10-06 MED ORDER — ENSURE ENLIVE PO LIQD: 237.0000 mL | Freq: Two times a day (BID) | ORAL | 12 refills | Status: AC

## 2018-10-06 NOTE — Progress Notes (Signed)
Report given to [place] RN. Call placed to [facility], patient's wife, to update and no answer and unable to leave message. Awaiting transport.

## 2018-10-06 NOTE — Progress Notes (Signed)
Transport arrived, report given, patient transported to [place]. Going to room 2216.

## 2018-10-06 NOTE — Discharge Summary (Signed)
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 Allergic rhinitis due to pollen    as child--better now  . Chronic kidney disease, stage III (moderate) (HCC)   . Coronary artery disease   . DVT, lower extremity, recurrent ([place])   . Glaucoma    [facility]   . History of prostate cancer 2004  . Hyperlipidemia   . Hypertension   . Hypothyroidism   . Impaired fasting glucose   . Prostate cancer ([place]) 2004   Rad seed implants  . Spinal stenosis of lumbar region with radiculopathy     HOSPITAL COURSE:  Dennis Burns  is a 85 y.o. male with a known history of chronic kidney disease stage III creatinine around 1.6, DVT recurrent on oral anticoagulation, glaucoma, CAD, lower extremity edema who follows with Dr. [name] as cardiology recently changed to torsemide 40 mg BID for leg edema comes into the emergency room with increasing confusion, weakness and low-grade fever of 100.6.  1. Acute on chronic renal failure-- stage III appears due to dehydration along with recent change in patients diuretic to torsemide with poor PO intake -baseline creatinine 1.2-- 1.6 -comes in with creatinine of 2.23 and appears clinically dehydrated -cautious IV  fluids -monitor input output, encouraged PO fluid intake -nephrology consultation if needed -hold nephrotoxic agents  2.Abnormal CT abdomen regarding aortic aneurysm focal -I have discussed results of abnormal CT scan with vascular surgery Dr Feliberto Gottron who recommended patient be transferred to [facility]. Spoke with Dr. Lovenia Shuck who reviewed the CT scan films and accepted the patient on vascular service. Patient will be transferred to [facility] for further evaluation management. Wife was aware and agreed with plan  3. Febrile illness-- came in with low-grade fever and weakness -no source of infection identified so far -patient received IV antibiotic empirically in the ER. I will hold off further dosing until source identified. Follow blood culture urine culture -monitor fever curve -COVID results negative  4. Relative hypotension -hold PO anti-blood pressure meds -getting IV fluids  5. Hypothyroidism  -continue Synthroid  6. DVT prophylaxis on eliquis  7. Chronic anticoagulation due to DVT -continue eliquis Was discussed with patient's wife in the ER.    CONSULTS OBTAINED:    DRUG ALLERGIES:  No Known Allergies  DISCHARGE MEDICATIONS:  Not dictated since pt left at 3 am--defer to [facility] MD's  If you experience worsening of your admission symptoms, develop shortness of breath, life threatening emergency, suicidal or homicidal thoughts you must seek medical attention immediately by calling 911 or calling your MD immediately  if symptoms less severe.  You Must read complete instructions/literature along with all the possible adverse reactions/side effects for all the Medicines you take and that have been prescribed to you. Take any new Medicines after you have completely understood and accept all the  possible adverse reactions/side effects.   Please note  You were cared for by a hospitalist during your hospital stay. If you have any questions about your discharge medications  or the care you received while you were in the hospital after you are discharged, you can call the unit and asked to speak with the hospitalist on call if the hospitalist that took care of you is not available. Once you are discharged, your primary care physician will handle any further medical issues. Please note that NO REFILLS for any discharge medications will be authorized once you are discharged, as it is imperative that you return to your primary care physician (or establish a relationship with a primary care physician if you do not have one) for your aftercare needs so that they can reassess your need for medications and monitor your lab values.  DATA REVIEW:   CBC  Recent Labs  Lab 10/05/18 1425  WBC 11.1*  HGB 8.5*  HCT 27.9*  PLT 205    Chemistries  Recent Labs  Lab 10/05/18 1300  NA 138  K 5.3*  CL 106  CO2 23  GLUCOSE 173*  BUN 141*  CREATININE 2.23*  CALCIUM 9.0  AST 50*  ALT 56*  ALKPHOS 88  BILITOT 0.9    Microbiology Results   Recent Results (from the past 240 hour(s))  Blood Culture (routine x 2)     Status: None (Preliminary result)   Collection Time: 10/05/18  1:00 PM   Specimen: BLOOD  Result Value Ref Range Status   Specimen Description BLOOD BLOOD LEFT ARM  Final   Special Requests   Final    BOTTLES DRAWN AEROBIC AND ANAEROBIC Blood Culture adequate volume   Culture   Final    NO GROWTH < 24 HOURS Performed at [facility], [address], [place], [place] [address]    Report Status PENDING  Incomplete  Blood Culture (routine x 2)     Status: None (Preliminary result)   Collection Time: 10/05/18  1:00 PM   Specimen: BLOOD  Result Value Ref Range Status   Specimen Description BLOOD LT UA  Final   Special Requests   Final    BOTTLES DRAWN AEROBIC AND ANAEROBIC Blood Culture adequate volume   Culture   Final    NO GROWTH < 24 HOURS Performed at [facility], [address], [place], [place] [address]    Report Status  PENDING  Incomplete  SARS Coronavirus 2 [facility] order, Performed in [facility] hospital lab) Nasopharyngeal Nasopharyngeal Swab     Status: None   Collection Time: 10/05/18  1:48 PM   Specimen: Nasopharyngeal Swab  Result Value Ref Range Status   SARS Coronavirus 2 NEGATIVE NEGATIVE Final    Comment: (NOTE) If result is NEGATIVE SARS-CoV-2 target nucleic acids are NOT DETECTED. The SARS-CoV-2 RNA is generally detectable in upper and lower  respiratory specimens during the acute phase of infection. The lowest  concentration of SARS-CoV-2 viral copies this assay can detect is 250  copies / mL. A negative result does not preclude SARS-CoV-2 infection  and should not be used as the sole basis for treatment or other  patient management decisions.  A negative result may occur with  improper specimen collection / handling, submission of specimen other  than nasopharyngeal swab, presence of viral mutation(s) within the  areas targeted by this assay, and inadequate number of viral copies  (<250 copies / mL). A negative result must be combined with clinical  observations, patient history, and epidemiological information. If result is POSITIVE SARS-CoV-2 target nucleic acids are DETECTED. The SARS-CoV-2 RNA is generally detectable in upper and lower  respiratory specimens dur ing the acute phase of infection.  Positive  results are indicative of active infection with SARS-CoV-2.  Clinical  correlation with patient history and other diagnostic information is  necessary to determine patient infection status.  Positive results do  not rule out bacterial infection or co-infection with other viruses. If result is PRESUMPTIVE POSTIVE SARS-CoV-2 nucleic acids MAY BE PRESENT.   A presumptive positive result was obtained on the submitted specimen  and confirmed on repeat testing.  While 2019 novel coronavirus  (SARS-CoV-2) nucleic acids may be present in the submitted sample  additional confirmatory  testing may be necessary for epidemiological  and / or clinical management purposes  to differentiate between  SARS-CoV-2 and other Sarbecovirus currently known to infect humans.  If clinically indicated additional testing with an alternate test  methodology [phone]) is advised. The SARS-CoV-2 RNA is generally  detectable in upper and lower respiratory sp ecimens during the acute  phase of infection. The expected result is Negative. Fact Sheet for Patients:  StrictlyIdeas.no Fact Sheet for Healthcare Providers: BankingDealers.co.za This test is not yet approved or cleared by the [place] FDA and has been authorized for detection and/or diagnosis of SARS-CoV-2 by FDA under an Emergency Use Authorization (EUA).  This EUA will remain in effect (meaning this test can be used) for the duration of the COVID-19 declaration under Section 564(b)(1) of the Act, 21 U.S.C. section 360bbb-3(b)(1), unless the authorization is terminated or revoked sooner. Performed at [facility], [address] [place], [place] [address]     RADIOLOGY:  Ct Abdomen Pelvis Wo Contrast  Result Date: 10/05/2018 CLINICAL DATA:  Pt ems from [facility] for AMS x 1 week, decreased oral intake x 3-4 day, and fever (100.5 ax here) of unknown length. Pt poor historian. HX prostate cancer. EXAM: CT CHEST, ABDOMEN AND PELVIS WITHOUT CONTRAST TECHNIQUE: Multidetector CT imaging of the chest, abdomen and pelvis was performed following the standard protocol without IV contrast. COMPARISON:  None. FINDINGS: CT CHEST FINDINGS Cardiovascular: Heart mildly enlarged. No pericardial effusion. Dense three-vessel coronary artery calcifications. Changes from prior CABG surgery. Great vessels normal in caliber. Aortic atherosclerotic calcifications extending to the arch branch vessels. Mediastinum/Nodes: No neck base, axillary, mediastinal or hilar masses or  enlarged lymph nodes. Trachea and esophagus are unremarkable. Lungs/Pleura: Mild interstitial thickening most evident in the lung bases. Bronchial wall thickening, also in the lower lungs most evident in the lower lobes. Minimal pleural effusions. No lung consolidation. No pneumothorax. Musculoskeletal: No acute fracture. No osteoblastic or osteolytic lesions. CT ABDOMEN PELVIS FINDINGS Hepatobiliary: 7 mm low-density lesion, segment 7. Liver normal in size and attenuation. No other masses or lesions. Gallbladder is unremarkable. No bile duct dilation. Pancreas: Unremarkable. No pancreatic ductal dilatation or surrounding inflammatory changes. Spleen: Spleen normal in size. Peripheral calcifications. No masses. Adrenals/Urinary Tract: No adrenal masses. Marked left renal atrophy. Right kidney normal in size. No renal masses. Nonobstructing left intrarenal stones. No right intrarenal stones. No hydronephrosis. Ureters normal in course and in caliber. Bladder is unremarkable. Stomach/Bowel: Stomach is unremarkable. Small bowel and colon are normal in caliber. Small bowel loops anterior a right inguinal hernia without obstruction or signs of incarceration or strangulation. No wall thickening or inflammation. Moderate increased stool burden in the colon. There are numerous sigmoid diverticula without diverticulitis. Normal appendix  visualized. Vascular/Lymphatic: There is mixed attenuation material extending beyond the intimal calcifications of the aorta at its hiatus, enlarging the transverse diameter of the vessel 2 4.8 x 2.9 cm. This extends for a short distance from superior to inferior, approximately 3.3 cm. This is consistent with a focal aneurysm or, possibly, focal contain rupture or penetrating ulcer. The chronicity of this finding is unclear. Below this the aorta is normal in caliber with dense atherosclerotic calcifications, which extended into branch vessels. No enlarged lymph nodes. Reproductive: Radiation  therapy seeds lie within the prostate and prostate bed. Other: No other abdominal wall hernia. Trace amount of ascites lies adjacent to the liver. Musculoskeletal: Chronic fracture of T12. There has been a long fusion with bilateral pedicle screws interconnecting rods extending from T12 through L5. L5 is a transitional lumbosacral vertebra. 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 No acute fracture.  No osteoblastic or osteolytic lesions. IMPRESSION: 1. Mild interstitial thickening in the lower lungs associated with mild cardiomegaly and minimal effusions. Suspect mild congestive heart failure. There is also bronchial wall thickening which may be due to edema or reflect bronchial inflammation. 2. Focal enlargement of the aorta at at its hiatus. This may reflect a focal aneurysm or a focal contained rupture/pseudoaneurysm. Penetrating ulcer is possible. Further assessment with CT angiography, if this patient can tolerate contrast, is recommended. 3. Trace amount of ascites, nonspecific. 4. Marked left renal atrophy. 5. Extensive aortic atherosclerotic disease. 6. Right inguinal hernia containing small bowel, without obstruction, incarceration or strangulation. Electronically Signed   By: Lajean Manes M.D.   On: 10/05/2018 15:12   Ct Head Wo Contrast  Result Date: 10/05/2018 CLINICAL DATA:  Pt ems from [facility] for AMS x 1 week, decreased oral intake x 3-4 day, and fever (100.5 ax here) of unknown length. Pt poor historian. HX prostate cancer. EXAM: CT HEAD WITHOUT CONTRAST TECHNIQUE: Contiguous axial images were obtained from the base of the skull through the vertex without intravenous contrast. COMPARISON:  None. FINDINGS: Brain: No acute infarct or intracranial hemorrhage. The ventricles are normal in configuration. There is age appropriate mild ventricular and sulcal enlargement. No parenchymal masses. Mild periventricular white matter  hypoattenuation noted consistent with chronic microvascular ischemic change. CSF density extra-axial mass indents the lateral right frontal lobe, measuring 4.3 x 2.4 x 5 cm, consistent with an arachnoid cyst. No other extra-axial abnormalities. No intracranial hemorrhage. Vascular: No hyperdense vessel or unexpected calcification. Skull: Normal. Negative for fracture or focal lesion. Sinuses/Orbits: Visualized globes and orbits are unremarkable. The visualized sinuses and mastoid air cells are clear. Other: None. IMPRESSION: 1. No acute intracranial abnormalities. 2. Age-appropriate volume loss. Mild chronic microvascular ischemic change. Chronic right arachnoid cyst. Electronically Signed   By: Lajean Manes M.D.   On: 10/05/2018 14:56   Ct Chest Wo Contrast  Result Date: 10/05/2018 CLINICAL DATA:  Pt ems from [facility] for AMS x 1 week, decreased oral intake x 3-4 day, and fever (100.5 ax here) of unknown length. Pt poor historian. HX prostate cancer. EXAM: CT CHEST, ABDOMEN AND PELVIS WITHOUT CONTRAST TECHNIQUE: Multidetector CT imaging of the chest, abdomen and pelvis was performed following the standard protocol without IV contrast. COMPARISON:  None. FINDINGS: CT CHEST FINDINGS Cardiovascular: Heart mildly enlarged. No pericardial effusion. Dense three-vessel coronary artery calcifications. Changes from prior CABG surgery. Great vessels normal in caliber. Aortic atherosclerotic calcifications extending to the arch branch vessels. Mediastinum/Nodes: No neck base, axillary, mediastinal or  hilar masses or enlarged lymph nodes. Trachea and esophagus are unremarkable. Lungs/Pleura: Mild interstitial thickening most evident in the lung bases. Bronchial wall thickening, also in the lower lungs most evident in the lower lobes. Minimal pleural effusions. No lung consolidation. No pneumothorax. Musculoskeletal: No acute fracture. No osteoblastic or osteolytic lesions. CT ABDOMEN PELVIS FINDINGS  Hepatobiliary: 7 mm low-density lesion, segment 7. Liver normal in size and attenuation. No other masses or lesions. Gallbladder is unremarkable. No bile duct dilation. Pancreas: Unremarkable. No pancreatic ductal dilatation or surrounding inflammatory changes. Spleen: Spleen normal in size. Peripheral calcifications. No masses. Adrenals/Urinary Tract: No adrenal masses. Marked left renal atrophy. Right kidney normal in size. No renal masses. Nonobstructing left intrarenal stones. No right intrarenal stones. No hydronephrosis. Ureters normal in course and in caliber. Bladder is unremarkable. Stomach/Bowel: Stomach is unremarkable. Small bowel and colon are normal in caliber. Small bowel loops anterior a right inguinal hernia without obstruction or signs of incarceration or strangulation. No wall thickening or inflammation. Moderate increased stool burden in the colon. There are numerous sigmoid diverticula without diverticulitis. Normal appendix visualized. Vascular/Lymphatic: There is mixed attenuation material extending beyond the intimal calcifications of the aorta at its hiatus, enlarging the transverse diameter of the vessel 2 4.8 x 2.9 cm. This extends for a short distance from superior to inferior, approximately 3.3 cm. This is consistent with a focal aneurysm or, possibly, focal contain rupture or penetrating ulcer. The chronicity of this finding is unclear. Below this the aorta is normal in caliber with dense atherosclerotic calcifications, which extended into branch vessels. No enlarged lymph nodes. Reproductive: Radiation therapy seeds lie within the prostate and prostate bed. Other: No other abdominal wall hernia. Trace amount of ascites lies adjacent to the liver. Musculoskeletal: Chronic fracture of T12. There has been a long fusion with bilateral pedicle screws interconnecting rods extending from T12 through L5. L5 is a transitional lumbosacral vertebra. Chronic avascular necrosis of both superior  femoral heads without subchondral collapse. No acute fracture.  No osteoblastic or osteolytic lesions. IMPRESSION: 1. Mild interstitial thickening in the lower lungs associated with mild cardiomegaly and minimal effusions. Suspect mild congestive heart failure. There is also bronchial wall thickening which may be due to edema or reflect bronchial inflammation. 2. Focal enlargement of the aorta at at its hiatus. This may reflect a focal aneurysm or a focal contained rupture/pseudoaneurysm. Penetrating ulcer is possible. Further assessment with CT angiography, if this patient can tolerate contrast, is recommended. 3. Trace amount of ascites, nonspecific. 4. Marked left renal atrophy. 5. Extensive aortic atherosclerotic disease. 6. Right inguinal hernia containing small bowel, without obstruction, incarceration or strangulation. Electronically Signed   By: Lajean Manes M.D.   On: 10/05/2018 15:12   Dg Chest Port 1 View  Result Date: 10/05/2018 CLINICAL DATA:  Fever and weakness EXAM: PORTABLE CHEST 1 VIEW COMPARISON:  May 06, 2017 FINDINGS: There is interstitial edema with a slight degree of interstitial edema. No consolidation. There is cardiomegaly with mild pulmonary venous hypertension. No adenopathy. Patient is status post median sternotomy. There is aortic atherosclerosis. There is also calcification in the carotid arteries bilaterally. There is postoperative change in the right neck region. There is postoperative change in the upper lumbar region. IMPRESSION: There is a degree of pulmonary vascular congestion with slight interstitial edema. No frank consolidation. There is postoperative change in the right neck region. Aortic Atherosclerosis (ICD10-I70.0). There is carotid artery calcification bilaterally. Electronically Signed   By: Lowella Grip III M.D.   On:  10/05/2018 13:43     CODE STATUS:     Code Status Orders  (From admission, onward)         Start     Ordered   10/05/18 1643  Full  code  Continuous     10/05/18 1642        Code Status History    Date Active Date Inactive Code Status Order ID Comments User Context   05/07/2017 1820 05/10/2017 2045 Full Code [id number]  Jerrye Beavers, RN Inpatient   05/04/2017 1403 05/07/2017 1820 DNR [id number]  Asencion Gowda, NP Inpatient   04/30/2017 2028 05/04/2017 1403 Full Code [id number]  Gorden Harms, MD Inpatient   02/14/2017 1645 02/15/2017 1507 Full Code [id number]  Wellington [place], MD Inpatient   Advance Care Planning Activity    Advance Directive Documentation     Most Recent Value  Type of Advance Directive  Healthcare Power of [place], Living will  Pre-existing out of facility DNR order (yellow form or pink MOST form)  -  "MOST" Form in Place?  -      TOTAL TIME TAKING CARE OF THIS PATIENT: **40* minutes.    Fritzi Mandes M.D on 10/06/2018 at 1:00 PM  Between 7am to 6pm - Pager - [phone] After 6pm go to [email or web address] - password [email or web address] [facility]  Office  [phone]  CC: Primary care physician; Dennis Carbon, MD

## 2018-10-07 MED ORDER — OXYCODONE-ACETAMINOPHEN 5-325 MG PO TABS
1.50 | ORAL_TABLET | ORAL | Status: DC
Start: ? — End: 2018-10-07

## 2018-10-07 MED ORDER — LIDOCAINE HCL 1 % IJ SOLN
.50 | INTRAMUSCULAR | Status: DC
Start: ? — End: 2018-10-07

## 2018-10-07 MED ORDER — HEPARIN (PORCINE) IN NACL 25000-0.45 UT/250ML-% IV SOLN
800.00 | INTRAVENOUS | Status: DC
Start: ? — End: 2018-10-07

## 2018-10-07 MED ORDER — TIMOLOL MALEATE 0.5 % OP SOLN
1.00 | OPHTHALMIC | Status: DC
Start: 2018-10-07 — End: 2018-10-07

## 2018-10-07 MED ORDER — ATORVASTATIN CALCIUM 40 MG PO TABS
40.00 | ORAL_TABLET | ORAL | Status: DC
Start: 2018-10-07 — End: 2018-10-07

## 2018-10-07 MED ORDER — ISOSORBIDE MONONITRATE ER 60 MG PO TB24
60.00 | ORAL_TABLET | ORAL | Status: DC
Start: 2018-10-07 — End: 2018-10-07

## 2018-10-07 MED ORDER — EZETIMIBE 10 MG PO TABS
10.00 | ORAL_TABLET | ORAL | Status: DC
Start: 2018-10-07 — End: 2018-10-07

## 2018-10-07 MED ORDER — LEVOTHYROXINE SODIUM 75 MCG PO TABS
75.00 | ORAL_TABLET | ORAL | Status: DC
Start: 2018-10-08 — End: 2018-10-07

## 2018-10-07 MED ORDER — SODIUM CHLORIDE 0.9 % IV SOLN
INTRAVENOUS | Status: DC
Start: ? — End: 2018-10-07

## 2018-10-08 ENCOUNTER — Telehealth: Payer: Self-pay | Admitting: Internal Medicine

## 2018-10-08 NOTE — Telephone Encounter (Signed)
She thinks the surgery will be on Thursday, 8/6 Discussed follow up after discharge, etc

## 2018-10-08 NOTE — Telephone Encounter (Signed)
Patient's wife, Velva Harman, returned Dr.Letvak's call.  Patient's at [facility].  Patient will be having surgery for an aortic aneurysm.  Velva Harman will be available to speak to [facility] until 12:00 and then she'll be leaving for the hospital.

## 2018-10-09 ENCOUNTER — Encounter: Payer: Self-pay | Admitting: Internal Medicine

## 2018-10-10 LAB — CULTURE, BLOOD (ROUTINE X 2)
Culture: NO GROWTH
Culture: NO GROWTH
Special Requests: ADEQUATE
Special Requests: ADEQUATE

## 2018-10-14 ENCOUNTER — Telehealth: Payer: Self-pay | Admitting: Internal Medicine

## 2018-10-14 NOTE — Telephone Encounter (Signed)
Patient's Wife called today to give an update on the patient. She stated that he is at [facility]. They done surgery this a/m and put a stent in for aortic aneurysm. She stated he is doing good and they advised them that he should be heading home in a day or two.   C/B # [phone]

## 2018-10-14 NOTE — Telephone Encounter (Signed)
Spoke to pt's wife. She will let us know.

## 2018-10-14 NOTE — Telephone Encounter (Signed)
Please thank her for the update. Have her let us know when he gets home---and we can decide on needed follow up

## 2018-10-28 ENCOUNTER — Telehealth: Payer: Self-pay | Admitting: *Deleted

## 2018-10-28 NOTE — Telephone Encounter (Signed)
-----   Message from Ace Gins sent at 10/25/2018  9:33 AM EDT ----- Regarding: Patient spouse calling Uvaldo Bristle, Velva Harman Mcnease called requesting to speak with you.  States Emryk passed away on 11-03-22 and wanted to thank you.Please call [phone]

## 2018-10-28 NOTE — Telephone Encounter (Signed)
Spoke with patients wife per release form. She states that she found him at home and was unresponsive called 911 and he was transported to [facility] It was discovered that he had a abdominal aorta aneurysm and was then sent to [facility]. They did surgery and placed stent and due to his blood numbers being off and they found him slumped over in the bathroom and worked on him for 30 minutes and just couldn't get him back. She wanted you to know that they both really thought a lot of you Dr. [name] and that she wanted to acknowledge and Thank You for the wonderful care he received under your care.

## 2018-10-29 ENCOUNTER — Ambulatory Visit: Payer: Medicare PPO | Admitting: Cardiovascular Disease

## 2018-11-05 DEATH — deceased

## 2018-11-26 IMAGING — CT CT NECK W/ CM
5 of 6 series · 14 of 33 positions shown, 16 images · IV contrast (iopamidol)
Comparison: None.

CLINICAL DATA: Left vocal cord paralysis. Worsening hoarseness and
dysphagia since [DATE]. History of prostate cancer and right
carotid endarterectomy. Former smoker.

EXAM:
CT NECK WITH CONTRAST
TECHNIQUE: Multidetector CT imaging of the neck was performed using the
standard protocol following the bolus administration of intravenous
contrast.
CONTRAST:  60mL [id number] IOPAMIDOL ([id number]) INJECTION 61%

[Series 2: axial neck neck (person_name) · axial · 0.57mm/px · z∈[-750,-662]mm · 2 of 132 slices shown, 3 images]
[im 44/132  soft-tissue]
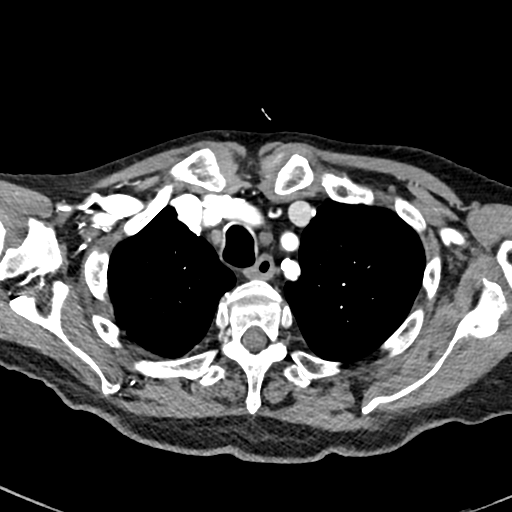
[im 44/132  bone]
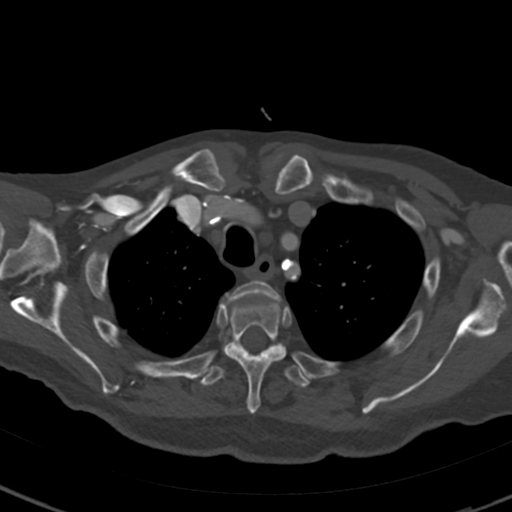
[im 88/132  bone]
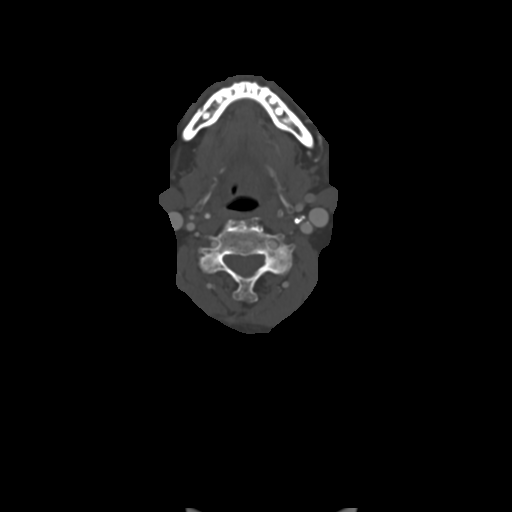

[Series 3: axial bone neck · axial · 0.57mm/px · z∈[-750,-662]mm · 2 of 132 slices shown]
[im 44/132  bone]
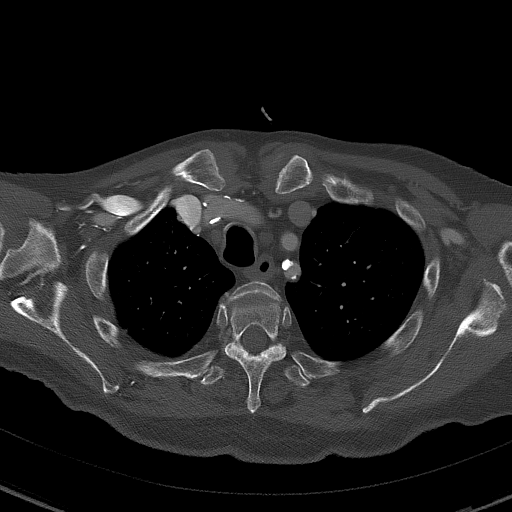
[im 88/132  bone]
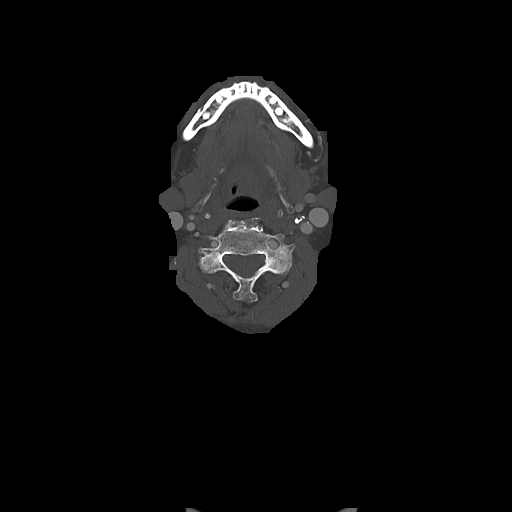

[Series 5: coronal neck neck (person_name) · coronal · 0.55mm/px · 3 of 133 slices shown]
[im 27/133  bone]
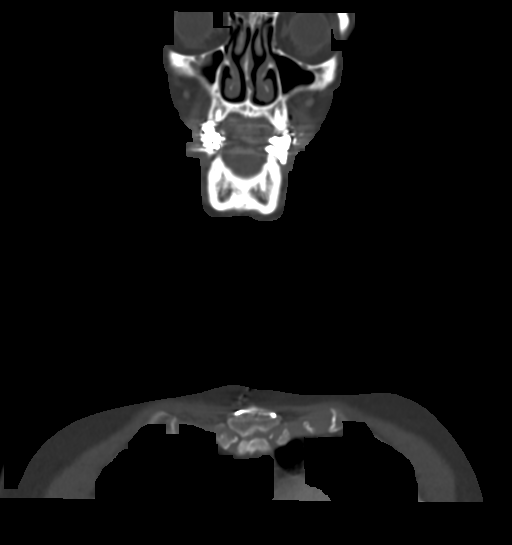
[im 53/133  bone]
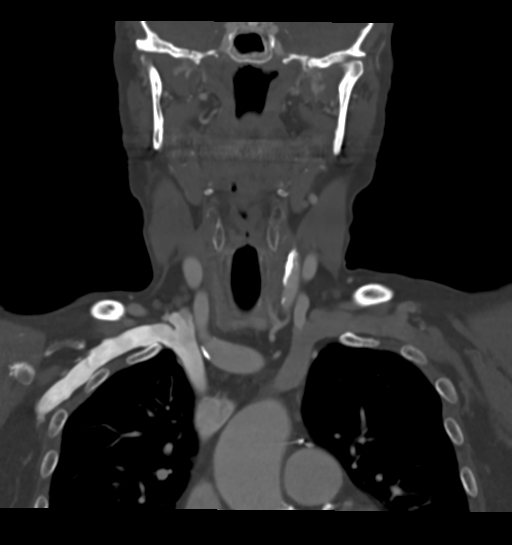
[im 80/133  bone]
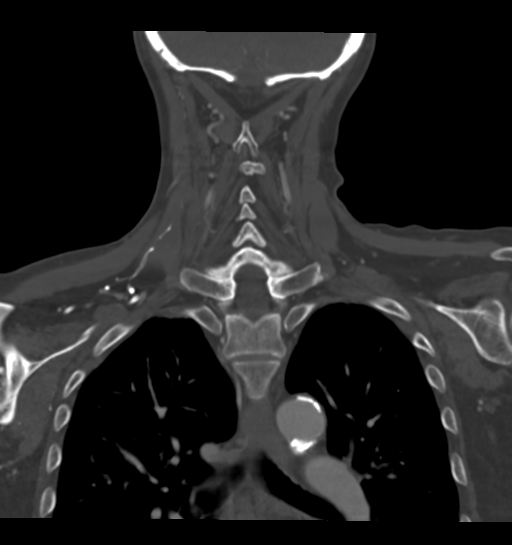

[Series 7: sagittal neck neck (person_name) · sagittal · 0.52mm/px · 5 of 140 slices shown, 6 images]
[im 47/140  bone]
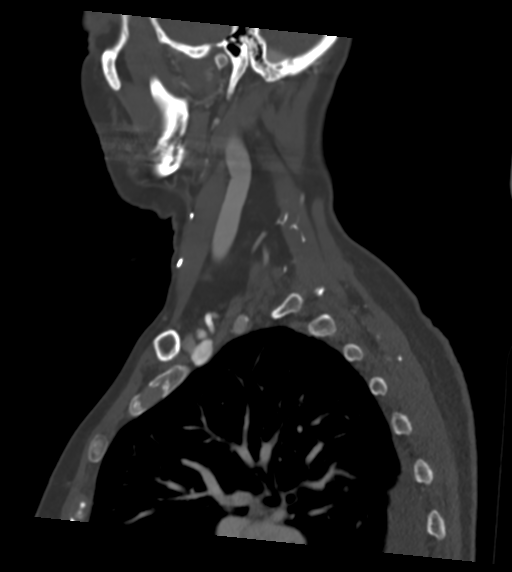
[im 58/140  bone]
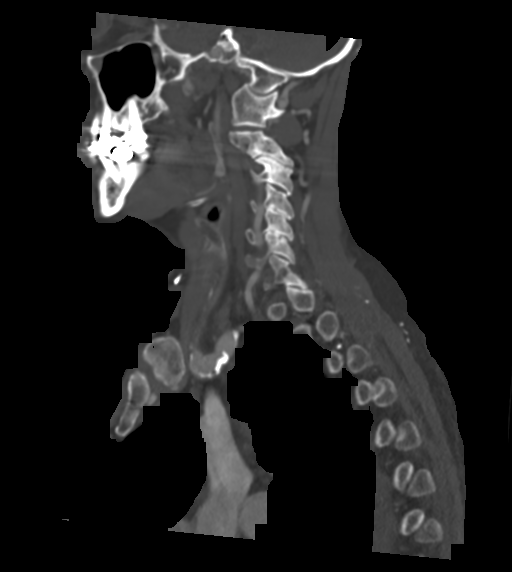
[im 70/140  soft-tissue]
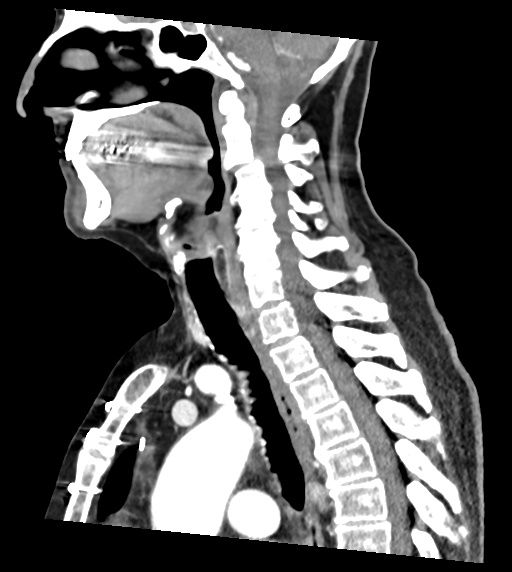
[im 70/140  bone]
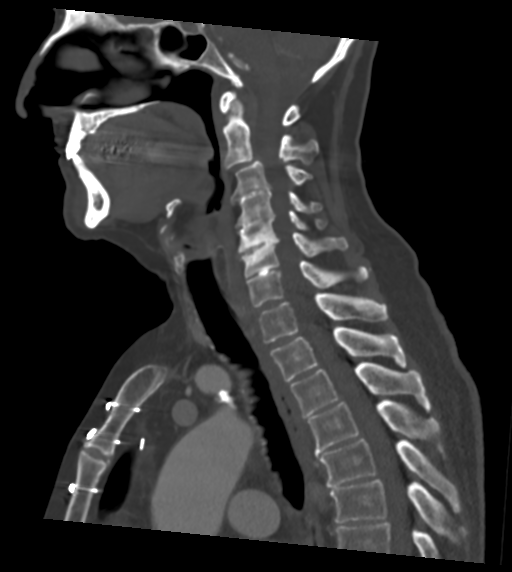
[im 82/140  bone]
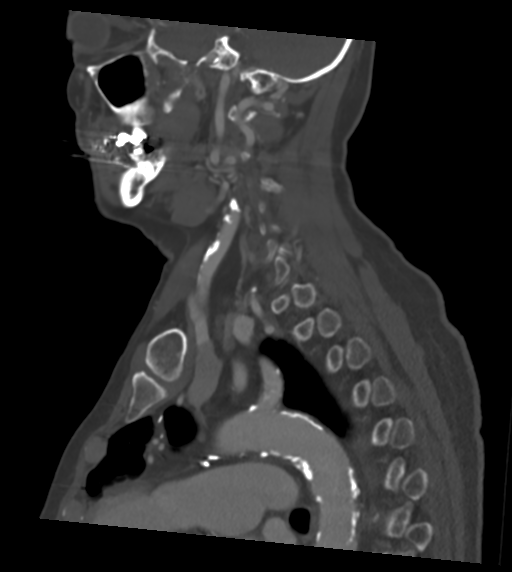
[im 93/140  bone]
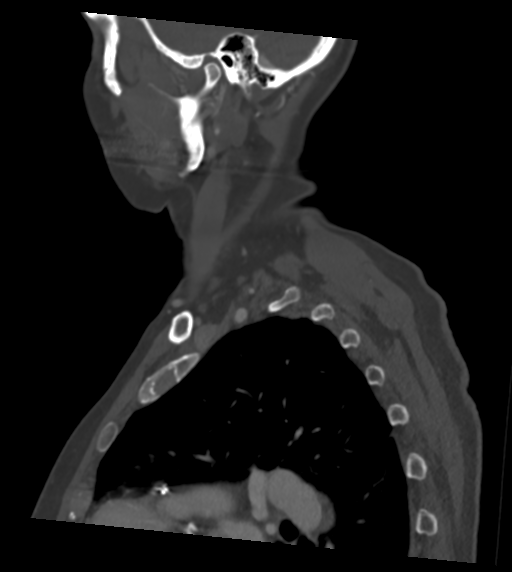

[Series 9: ax oropharynx neck neck (person_name) · axial · 0.52mm/px · z∈[-770,-672]mm · 2 of 149 slices shown]
[im 50/149  bone]
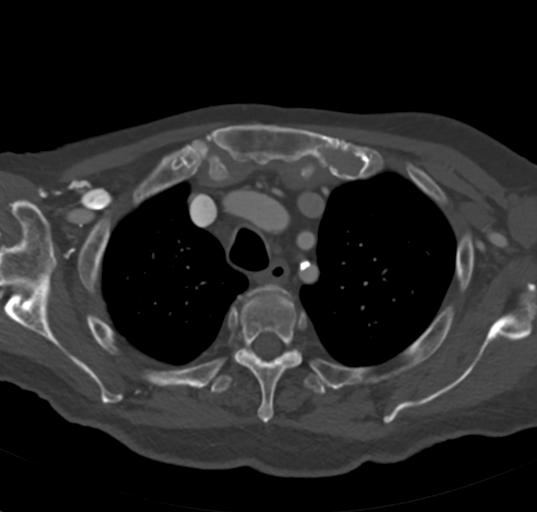
[im 99/149  bone]
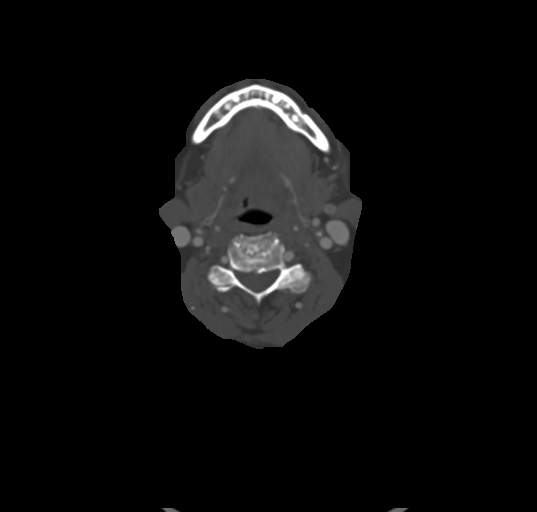

[14 of 33 positions shown; findings below may reference images not displayed]

FINDINGS: Pharynx and larynx: The left aryepiglottic fold appears mildly
thickened and mildly medially positioned. There is asymmetric
enlargement of the left laryngeal ventricle, and the left vocal cord
appears abducted which may be secondary to imaging during breath
holding given adducted right vocal cord. No pharyngeal or laryngeal
mass is identified.

Salivary glands: No inflammation, mass, or stone.

Thyroid: Small thyroid gland without mass identified.

Lymph nodes: Slightly prominent though subcentimeter supraclavicular
lymph nodes bilaterally measuring 9 mm in short axis on the right
and 8 mm on the left. No enlarged or suspicious lymph nodes
elsewhere in the neck.

Vascular: Major vascular structures of the neck are patent. Aortic
and extensive coronary artery atherosclerosis is partially
visualized. There has been prior right carotid endarterectomy.
Extensive, predominantly calcified plaque at the carotid bifurcation
appears to result in severe stenosis of both the ICA and ECA
origins.

Limited intracranial: Unremarkable.

Visualized orbits: Unremarkable.

Mastoids and visualized paranasal sinuses: Minimal right maxillary
sinus mucosal thickening. Moderate right mastoid effusion.

Skeleton: Severe C2-3 facet arthrosis with associated grade 1
anterolisthesis. Degenerative appearing interbody and facet
ankylosis at C3-4 and C4-5. Severe C5-6 disc degeneration also with
evidence of interbody ankylosis. C6-7 interbody ankylosis.

Upper chest: Prominent number of subcentimeter lymph nodes
throughout the mediastinum and in the right hilum. Partially
visualized small right pleural effusion. No mass in the included
portions of the lungs. Prior CABG.

Other: None.
IMPRESSION: 1. Findings compatible with the provided history of left vocal cord
paralysis. No neck or superior thoracic mass.
2. Increased number of small lymph nodes throughout the mediastinum
and in the supraclavicular regions, nonspecific.
3. Small right pleural effusion.
4. Extensive atherosclerotic plaque at the left carotid bifurcation
resulting in severe ICA and ECA origin stenosis.
5.  Aortic Atherosclerosis ([id number]).
